# Patient Record
Sex: Male | Born: 1965 | Race: White | Hispanic: No | Smoking: Former smoker
Health system: Southern US, Community
[De-identification: ages and names within clinical notes are randomized; demographics above are authoritative.]

## PROBLEM LIST (undated history)

## (undated) DIAGNOSIS — E871 Hypo-osmolality and hyponatremia: Secondary | ICD-10-CM

## (undated) DIAGNOSIS — J45909 Unspecified asthma, uncomplicated: Secondary | ICD-10-CM

## (undated) DIAGNOSIS — M4850XA Collapsed vertebra, not elsewhere classified, site unspecified, initial encounter for fracture: Secondary | ICD-10-CM

## (undated) DIAGNOSIS — M810 Age-related osteoporosis without current pathological fracture: Secondary | ICD-10-CM

## (undated) DIAGNOSIS — K219 Gastro-esophageal reflux disease without esophagitis: Secondary | ICD-10-CM

## (undated) DIAGNOSIS — F419 Anxiety disorder, unspecified: Secondary | ICD-10-CM

## (undated) DIAGNOSIS — I10 Essential (primary) hypertension: Secondary | ICD-10-CM

## (undated) DIAGNOSIS — K259 Gastric ulcer, unspecified as acute or chronic, without hemorrhage or perforation: Secondary | ICD-10-CM

## (undated) DIAGNOSIS — F32A Depression, unspecified: Secondary | ICD-10-CM

## (undated) DIAGNOSIS — IMO0002 Reserved for concepts with insufficient information to code with codable children: Secondary | ICD-10-CM

## (undated) DIAGNOSIS — F1090 Alcohol use, unspecified, uncomplicated: Secondary | ICD-10-CM

## (undated) DIAGNOSIS — M109 Gout, unspecified: Secondary | ICD-10-CM

## (undated) DIAGNOSIS — C61 Malignant neoplasm of prostate: Secondary | ICD-10-CM

## (undated) DIAGNOSIS — T7840XA Allergy, unspecified, initial encounter: Secondary | ICD-10-CM

## (undated) DIAGNOSIS — Q600 Renal agenesis, unilateral: Secondary | ICD-10-CM

## (undated) DIAGNOSIS — M1990 Unspecified osteoarthritis, unspecified site: Secondary | ICD-10-CM

## (undated) HISTORY — DX: Allergy, unspecified, initial encounter: T78.40XA

## (undated) HISTORY — DX: Essential (primary) hypertension: I10

## (undated) HISTORY — DX: Depression, unspecified: F32.A

## (undated) HISTORY — DX: Unspecified osteoarthritis, unspecified site: M19.90

## (undated) HISTORY — DX: Gastro-esophageal reflux disease without esophagitis: K21.9

## (undated) HISTORY — DX: Anxiety disorder, unspecified: F41.9

## (undated) HISTORY — PX: PROSTATE BIOPSY: SHX241

## (undated) HISTORY — DX: Collapsed vertebra, not elsewhere classified, site unspecified, initial encounter for fracture: M48.50XA

## (undated) HISTORY — DX: Reserved for concepts with insufficient information to code with codable children: IMO0002

## (undated) HISTORY — PX: COLONOSCOPY WITH ESOPHAGOGASTRODUODENOSCOPY (EGD): SHX5779

## (undated) HISTORY — PX: TONSILLECTOMY: SUR1361

---

## 2005-05-31 ENCOUNTER — Emergency Department: Payer: Self-pay | Admitting: Emergency Medicine

## 2005-06-15 ENCOUNTER — Emergency Department: Payer: Self-pay | Admitting: General Practice

## 2008-05-04 ENCOUNTER — Ambulatory Visit: Payer: Self-pay | Admitting: Anesthesiology

## 2008-05-07 ENCOUNTER — Ambulatory Visit: Payer: Self-pay | Admitting: Anesthesiology

## 2008-06-01 ENCOUNTER — Ambulatory Visit: Payer: Self-pay | Admitting: Anesthesiology

## 2008-06-12 ENCOUNTER — Emergency Department: Payer: Self-pay | Admitting: Emergency Medicine

## 2008-07-07 ENCOUNTER — Ambulatory Visit: Payer: Self-pay | Admitting: Anesthesiology

## 2008-10-24 ENCOUNTER — Observation Stay: Payer: Self-pay | Admitting: Internal Medicine

## 2009-07-29 ENCOUNTER — Inpatient Hospital Stay: Payer: Self-pay | Admitting: Specialist

## 2012-08-13 ENCOUNTER — Emergency Department: Payer: Self-pay | Admitting: Emergency Medicine

## 2013-10-10 ENCOUNTER — Ambulatory Visit: Payer: Self-pay | Admitting: Family Medicine

## 2013-10-31 ENCOUNTER — Emergency Department: Payer: Self-pay | Admitting: Emergency Medicine

## 2013-10-31 LAB — CBC
MCH: 32.4 pg (ref 26.0–34.0)
MCHC: 34.1 g/dL (ref 32.0–36.0)
MCV: 95 fL (ref 80–100)
Platelet: 241 10*3/uL (ref 150–440)
WBC: 7.5 10*3/uL (ref 3.8–10.6)

## 2013-10-31 LAB — BASIC METABOLIC PANEL
Anion Gap: 8 (ref 7–16)
Calcium, Total: 8.9 mg/dL (ref 8.5–10.1)
Creatinine: 1.18 mg/dL (ref 0.60–1.30)
Osmolality: 277 (ref 275–301)
Sodium: 139 mmol/L (ref 136–145)

## 2013-10-31 LAB — TROPONIN I: Troponin-I: <0.02 ng/mL

## 2014-12-28 ENCOUNTER — Emergency Department: Payer: Self-pay | Admitting: Student

## 2016-01-28 DIAGNOSIS — K5791 Diverticulosis of intestine, part unspecified, without perforation or abscess with bleeding: Secondary | ICD-10-CM | POA: Insufficient documentation

## 2016-01-28 DIAGNOSIS — Q600 Renal agenesis, unilateral: Secondary | ICD-10-CM | POA: Insufficient documentation

## 2020-03-05 DIAGNOSIS — M47816 Spondylosis without myelopathy or radiculopathy, lumbar region: Secondary | ICD-10-CM | POA: Insufficient documentation

## 2022-05-07 ENCOUNTER — Encounter: Payer: Self-pay | Admitting: Radiology

## 2022-05-07 ENCOUNTER — Other Ambulatory Visit: Payer: Self-pay

## 2022-05-07 ENCOUNTER — Emergency Department: Payer: Medicare PPO

## 2022-05-07 ENCOUNTER — Emergency Department
Admission: EM | Admit: 2022-05-07 | Discharge: 2022-05-08 | Disposition: A | Discharge status: Home / Self Care | Payer: Medicare PPO | Attending: Emergency Medicine | Admitting: Emergency Medicine

## 2022-05-07 DIAGNOSIS — K2970 Gastritis, unspecified, without bleeding: Secondary | ICD-10-CM | POA: Diagnosis not present

## 2022-05-07 DIAGNOSIS — R109 Unspecified abdominal pain: Secondary | ICD-10-CM | POA: Diagnosis present

## 2022-05-07 LAB — URINALYSIS, COMPLETE (UACMP) WITH MICROSCOPIC
Bacteria, UA: NONE SEEN
Bilirubin Urine: NEGATIVE
Glucose, UA: NEGATIVE mg/dL
Hgb urine dipstick: NEGATIVE
Ketones, ur: NEGATIVE mg/dL
Leukocytes,Ua: NEGATIVE
Nitrite: NEGATIVE
Protein, ur: NEGATIVE mg/dL
Specific Gravity, Urine: 1.003 — ABNORMAL LOW (ref 1.005–1.030)
Squamous Epithelial / HPF: NONE SEEN (ref 0–5)
pH: 6 (ref 5.0–8.0)

## 2022-05-07 LAB — CBC
HCT: 44.7 % (ref 39.0–52.0)
Hemoglobin: 15.2 g/dL (ref 13.0–17.0)
MCH: 33.2 pg (ref 26.0–34.0)
MCHC: 34 g/dL (ref 30.0–36.0)
MCV: 97.6 fL (ref 80.0–100.0)
Platelets: 252 10*3/uL (ref 150–400)
RBC: 4.58 MIL/uL (ref 4.22–5.81)
RDW: 12.3 % (ref 11.5–15.5)
WBC: 5.9 10*3/uL (ref 4.0–10.5)
nRBC: 0 % (ref 0.0–0.2)

## 2022-05-07 LAB — COMPREHENSIVE METABOLIC PANEL
ALT: 21 U/L (ref 0–44)
AST: 23 U/L (ref 15–41)
Albumin: 4.9 g/dL (ref 3.5–5.0)
Alkaline Phosphatase: 46 U/L (ref 38–126)
Anion gap: 10 (ref 5–15)
BUN: 12 mg/dL (ref 6–20)
CO2: 25 mmol/L (ref 22–32)
Calcium: 10 mg/dL (ref 8.9–10.3)
Chloride: 98 mmol/L (ref 98–111)
Creatinine, Ser: 1.02 mg/dL (ref 0.61–1.24)
GFR, Estimated: >60 mL/min (ref >60)
Glucose, Bld: 90 mg/dL (ref 70–99)
Potassium: 4.3 mmol/L (ref 3.5–5.1)
Sodium: 133 mmol/L — ABNORMAL LOW (ref 135–145)
Total Bilirubin: 1.5 mg/dL — ABNORMAL HIGH (ref 0.3–1.2)
Total Protein: 8.4 g/dL — ABNORMAL HIGH (ref 6.5–8.1)

## 2022-05-07 LAB — SAMPLE TO BLOOD BANK

## 2022-05-07 LAB — LIPASE, BLOOD: Lipase: 23 U/L (ref 11–51)

## 2022-05-07 MED ORDER — SUCRALFATE 1 G PO TABS
1.0000 g | ORAL_TABLET | Freq: Four times a day (QID) | ORAL | 1 refills | Status: DC
  Filled 2022-05-07: qty 120, 30d supply, fill #0

## 2022-05-07 MED ORDER — MORPHINE SULFATE (PF) 4 MG/ML IV SOLN
4.0000 mg | Freq: Once | INTRAVENOUS | Status: AC
  Administered 2022-05-07: 4 mg via INTRAVENOUS
  Filled 2022-05-07: qty 1

## 2022-05-07 MED ORDER — ONDANSETRON HCL 4 MG/2ML IJ SOLN
4.0000 mg | Freq: Once | INTRAMUSCULAR | Status: AC
  Administered 2022-05-07: 4 mg via INTRAVENOUS
  Filled 2022-05-07: qty 2

## 2022-05-07 MED ORDER — SODIUM CHLORIDE 0.9 % IV BOLUS
1000.0000 mL | Freq: Once | INTRAVENOUS | Status: AC
  Administered 2022-05-07: 1000 mL via INTRAVENOUS

## 2022-05-07 NOTE — ED Triage Notes (Signed)
Pt states has had upper abd pain for " a long time" but began to experience possible hematemesis vs. Hemoptysis yesterday. Pt states he was supposed to obtain a biopsy on his prostate "a while ago" but has been unable to do so. Pt appears in no acute distress. Pt states he feels shaky and intermittently shob with activity.

## 2022-05-07 NOTE — ED Provider Notes (Signed)
[facility] Provider Note    None    (approximate)  History   Chief Complaint: Abdominal Pain and Hematemesis  HPI  Frank Terry is a 56 y.o. male who presents to the emergency department for abdominal pain.  According to the patient for the past few months he has been experiencing abdominal pain at times with nausea and vomiting at times with reflux.  Patient states today he spit up what appeared to be darker looking vomit.  Patient also states he has had some difficulty urinating at times, was supposed to have a prostate biopsy done months ago but never followed up.  Denies any known fever.  No diarrhea.  Denies dysuria.  Physical Exam   Triage Vital Signs: ED Triage Vitals  Enc Vitals Group     BP 05/07/22 1903 (!) 152/106     Pulse Rate 05/07/22 1903 91     Resp 05/07/22 1903 16     Temp 05/07/22 1903 98.3 F (36.8 C)     Temp Source 05/07/22 1903 Oral     SpO2 05/07/22 1903 96 %     Weight 05/07/22 1903 132 lb (59.9 kg)     Height 05/07/22 1903 '5\' 9"'$  (1.753 m)     Head Circumference --      Peak Flow --      Pain Score 05/07/22 1906 5     Pain Loc --      Pain Edu? --      Excl. in [place]? --     Most recent vital signs: Vitals:   05/07/22 1903  BP: (!) 152/106  Pulse: 91  Resp: 16  Temp: 98.3 F (36.8 C)  SpO2: 96%    General: Awake, no distress.  CV:  Good peripheral perfusion.  Regular rate and rhythm  Resp:  Normal effort.  Equal breath sounds bilaterally.  Abd:  No distention.  Soft, nontender.  No rebound or guarding.   ED Results / Procedures / Treatments   EKG  EKG viewed and interpreted by myself shows a normal sinus rhythm at 96 bpm with a narrow QRS, normal axis, normal intervals, no concerning ST changes.  RADIOLOGY  I have reviewed the chest x-ray images no obvious abnormality seen on my interpretation. Radiology is read the x-ray as COPD but no other acute abnormality. CT of the abdomen/pelvis shows no acute  findings.   MEDICATIONS ORDERED IN ED: Medications  sodium chloride 0.9 % bolus 1,000 mL (has no administration in time range)  morphine (PF) 4 MG/ML injection 4 mg (has no administration in time range)  ondansetron (ZOFRAN) injection 4 mg (has no administration in time range)     IMPRESSION / MDM / ASSESSMENT AND PLAN / ED COURSE  I reviewed the triage vital signs and the nursing notes.  Patient's presentation is most consistent with acute presentation with potential threat to life or bodily function.  Patient presents emergency department for upper abdominal pain and intermittent nausea and vomiting.  He states his symptoms have been ongoing for months but worsening recently.  He also states some difficulty urinating.  Patient's work-up is overall reassuring.  Chemistry is normal including normal renal function.  CBC is normal including a normal white blood cell count.  Lipase and LFTs are normal.  Urinalysis shows no concerning findings.  CT scan is negative.  Chest x-ray is clear.  I discussed with the patient the possibility of gastritis recommended Protonix and sucralfate with GI follow-up.  Patient states Protonix has given him gout in the past and he does not want to take any medications.  I discussed following up with GI medicine patient states he is homeless but he will just take Tums.  Given the patient's reassuring work-up I believe he is safe for discharge home.  Suspect likely gastritis versus peptic ulcer.  Patient states he has had peptic ulcers before.  We will still refer to GI medicine I will prescribe sucralfate for the patient and leave it up to him whether or not to take these medications.  FINAL CLINICAL IMPRESSION(S) / ED DIAGNOSES   Gastritis    Note:  This document was prepared using Dragon voice recognition software and may include unintentional dictation errors.   Harvest Dark, MD 05/07/22 2324

## 2022-05-07 NOTE — Discharge Instructions (Addendum)
Please call the number provided for GI medicine to arrange a follow-up appointment soon as possible.  Please begin taking your sucralfate as prescribed.  Return to the emergency department for any worsening pain, fever, or any other symptom personally concerning to yourself.

## 2022-05-08 ENCOUNTER — Other Ambulatory Visit: Payer: Self-pay

## 2022-05-18 ENCOUNTER — Emergency Department: Payer: Medicare PPO

## 2022-05-18 ENCOUNTER — Other Ambulatory Visit: Payer: Self-pay

## 2022-05-18 ENCOUNTER — Emergency Department
Admission: EM | Admit: 2022-05-18 | Discharge: 2022-05-18 | Disposition: A | Discharge status: Home / Self Care | Payer: Medicare PPO | Attending: Emergency Medicine | Admitting: Emergency Medicine

## 2022-05-18 DIAGNOSIS — R1032 Left lower quadrant pain: Secondary | ICD-10-CM | POA: Insufficient documentation

## 2022-05-18 DIAGNOSIS — M549 Dorsalgia, unspecified: Secondary | ICD-10-CM | POA: Diagnosis not present

## 2022-05-18 LAB — CBC WITH DIFFERENTIAL/PLATELET
Abs Immature Granulocytes: 0.01 10*3/uL (ref 0.00–0.07)
Basophils Absolute: 0.1 10*3/uL (ref 0.0–0.1)
Basophils Relative: 1 %
Eosinophils Absolute: 0.3 10*3/uL (ref 0.0–0.5)
Eosinophils Relative: 7 %
HCT: 42.2 % (ref 39.0–52.0)
Hemoglobin: 14.3 g/dL (ref 13.0–17.0)
Immature Granulocytes: 0 %
Lymphocytes Relative: 26 %
Lymphs Abs: 1.1 10*3/uL (ref 0.7–4.0)
MCH: 33.3 pg (ref 26.0–34.0)
MCHC: 33.9 g/dL (ref 30.0–36.0)
MCV: 98.4 fL (ref 80.0–100.0)
Monocytes Absolute: 0.6 10*3/uL (ref 0.1–1.0)
Monocytes Relative: 14 %
Neutro Abs: 2.3 10*3/uL (ref 1.7–7.7)
Neutrophils Relative %: 52 %
Platelets: 209 10*3/uL (ref 150–400)
RBC: 4.29 MIL/uL (ref 4.22–5.81)
RDW: 11.7 % (ref 11.5–15.5)
WBC: 4.5 10*3/uL (ref 4.0–10.5)
nRBC: 0 % (ref 0.0–0.2)

## 2022-05-18 LAB — CHLAMYDIA/NGC RT PCR (ARMC ONLY)
Chlamydia Tr: NOT DETECTED
N gonorrhoeae: NOT DETECTED

## 2022-05-18 LAB — COMPREHENSIVE METABOLIC PANEL
ALT: 21 U/L (ref 0–44)
AST: 27 U/L (ref 15–41)
Albumin: 4.3 g/dL (ref 3.5–5.0)
Alkaline Phosphatase: 37 U/L — ABNORMAL LOW (ref 38–126)
Anion gap: 8 (ref 5–15)
BUN: 9 mg/dL (ref 6–20)
CO2: 24 mmol/L (ref 22–32)
Calcium: 9.3 mg/dL (ref 8.9–10.3)
Chloride: 100 mmol/L (ref 98–111)
Creatinine, Ser: 0.78 mg/dL (ref 0.61–1.24)
GFR, Estimated: >60 mL/min (ref >60)
Glucose, Bld: 92 mg/dL (ref 70–99)
Potassium: 3.7 mmol/L (ref 3.5–5.1)
Sodium: 132 mmol/L — ABNORMAL LOW (ref 135–145)
Total Bilirubin: 0.8 mg/dL (ref 0.3–1.2)
Total Protein: 7 g/dL (ref 6.5–8.1)

## 2022-05-18 LAB — URINALYSIS, COMPLETE (UACMP) WITH MICROSCOPIC
Bacteria, UA: NONE SEEN
Bilirubin Urine: NEGATIVE
Glucose, UA: NEGATIVE mg/dL
Hgb urine dipstick: NEGATIVE
Ketones, ur: NEGATIVE mg/dL
Leukocytes,Ua: NEGATIVE
Nitrite: NEGATIVE
Protein, ur: NEGATIVE mg/dL
Specific Gravity, Urine: 1.003 — ABNORMAL LOW (ref 1.005–1.030)
Squamous Epithelial / HPF: NONE SEEN (ref 0–5)
pH: 6 (ref 5.0–8.0)

## 2022-05-18 MED ORDER — OXYCODONE-ACETAMINOPHEN 5-325 MG PO TABS
1.0000 | ORAL_TABLET | Freq: Once | ORAL | Status: AC
  Administered 2022-05-18: 1 via ORAL
  Filled 2022-05-18: qty 1

## 2022-05-18 MED ORDER — IOHEXOL 300 MG/ML  SOLN
100.0000 mL | Freq: Once | INTRAMUSCULAR | Status: AC | PRN
Start: 2022-05-18 — End: 2022-05-18
  Administered 2022-05-18: 100 mL via INTRAVENOUS

## 2022-05-18 MED ORDER — OXYCODONE-ACETAMINOPHEN 5-325 MG PO TABS: 1.0000 | ORAL_TABLET | ORAL | 0 refills | Status: DC | PRN

## 2022-05-18 NOTE — ED Notes (Signed)
Pt verbalized understanding of discharge instructions, prescriptions, and follow-up care instructions. Pt advised if symptoms worsen to return to ED.

## 2022-05-18 NOTE — ED Triage Notes (Signed)
Pt reports pain in lower back for about 3 weeksPt denies injuries. Pt also reports some pain to his left groin along with swelling. Pt states currenlty being checked for prostate cancer.

## 2022-05-18 NOTE — ED Provider Notes (Signed)
[facility] Provider Note    Event Date/Time   First MD Initiated Contact with Patient 05/18/22 1123     (approximate)   History   Chief Complaint Groin Pain and Back Pain   HPI  Frank Terry is a 56 y.o. male with no significant past medical history presents to the ED complaining of groin pain.  Patient reports that he has been dealing with a couple of weeks of pain in the area of his left inguinal crease.  He states the pain is constant and sharp, worse with palpation or certain movements.  He states that the pain seems to radiate down towards his left testicle but he does not have any pain in the testicle itself.  He denies any dysuria, hematuria, penile discharge, fevers, or flank pain.  He does state that the area in his left inguinal crease seems slightly swollen, but he denies any history of inguinal hernia.  He does state that he has been diagnosed with vasitis in the past and current symptoms feel similar.  He has not had any abdominal pain, nausea, or vomiting.  He states he has not been sexually active in the past 6 months.     Physical Exam   Triage Vital Signs: ED Triage Vitals  Enc Vitals Group     BP 05/18/22 1052 (!) 148/97     Pulse Rate 05/18/22 1052 82     Resp 05/18/22 1052 19     Temp 05/18/22 1052 97.9 F (36.6 C)     Temp Source 05/18/22 1052 Oral     SpO2 05/18/22 1052 100 %     Weight 05/18/22 1050 132 lb (59.9 kg)     Height 05/18/22 1050 '5\' 9"'$  (1.753 m)     Head Circumference --      Peak Flow --      Pain Score 05/18/22 1049 7     Pain Loc --      Pain Edu? --      Excl. in [place]? --     Most recent vital signs: Vitals:   05/18/22 1052 05/18/22 1650  BP: (!) 148/97 (!) 149/85  Pulse: 82 75  Resp: 19 16  Temp: 97.9 F (36.6 C) 97.9 F (36.6 C)  SpO2: 100% 100%    Constitutional: Alert and oriented. Eyes: Conjunctivae are normal. Head: Atraumatic. Nose: No congestion/rhinnorhea. Mouth/Throat: Mucous membranes  are moist.  Cardiovascular: Normal rate, regular rhythm. Grossly normal heart sounds.  2+ radial pulses bilaterally. Respiratory: Normal respiratory effort.  No retractions. Lungs CTAB. Gastrointestinal: Soft and nontender. No distention. Genitourinary: Left testicular tenderness to palpation noted with no erythema, warmth, or fluctuance.  Patient with more pronounced tenderness in his left inguinal crease with mild associated edema, no hernia noted. Musculoskeletal: No lower extremity tenderness nor edema.  Neurologic:  Normal speech and language. No gross focal neurologic deficits are appreciated.    ED Results / Procedures / Treatments   Labs (all labs ordered are listed, but only abnormal results are displayed) Labs Reviewed  COMPREHENSIVE METABOLIC PANEL - Abnormal; Notable for the following components:      Result Value   Sodium 132 (*)    Alkaline Phosphatase 37 (*)    All other components within normal limits  URINALYSIS, COMPLETE (UACMP) WITH MICROSCOPIC - Abnormal; Notable for the following components:   Color, Urine STRAW (*)    APPearance CLEAR (*)    Specific Gravity, Urine 1.003 (*)    All other  components within normal limits  CHLAMYDIA/NGC RT PCR ([facility] ONLY)            CBC WITH DIFFERENTIAL/PLATELET   RADIOLOGY CT abdomen/pelvis reviewed and interpreted by me with no focal fluid collections, dilated bowel loops, or inflammatory changes noted.  PROCEDURES:  Critical Care performed: No  Procedures   MEDICATIONS ORDERED IN ED: Medications  oxyCODONE-acetaminophen (PERCOCET/ROXICET) 5-325 MG per tablet 1 tablet (has no administration in time range)  oxyCODONE-acetaminophen (PERCOCET/ROXICET) 5-325 MG per tablet 1 tablet (1 tablet Oral Given 05/18/22 1251)  iohexol (OMNIPAQUE) 300 MG/ML solution 100 mL (100 mLs Intravenous Contrast Given 05/18/22 1825)     IMPRESSION / MDM / ASSESSMENT AND PLAN / ED COURSE  I reviewed the triage vital signs and the nursing  notes.                              56 y.o. male with no significant past medical history presents to the ED with 2 weeks of pain in his left groin radiating down towards his left testicle without any urinary symptoms.  Patient's presentation is most consistent with acute presentation with potential threat to life or bodily function.  Differential diagnosis includes, but is not limited to, epididymitis, vasitis, UTI, urethritis, inguinal hernia, cellulitis.  Patient well-appearing and in no acute distress, vital signs are unremarkable and initial labs are reassuring with no significant anemia, leukocytosis, electrolyte abnormality, or AKI.  His LFTs are within normal limits, urinalysis is pending at this time.  Plan to further assess with ultrasound for epididymitis given his scrotal tenderness, no obvious hernia noted on exam.  We will also send urine for GC and chlamydia.  He had CT performed about 2 weeks ago for upper abdominal pain that did not show any left inguinal hernia at that time or other acute pathology in his pelvic area.  Plan to treat symptomatically with Percocet and reassess following urinalysis and imaging.  Scrotal ultrasound is unremarkable and urinalysis shows no signs of infection, GC and chlamydia testing is also negative.  Patient continues to have significant tenderness in his left groin area, CT scan was performed and shows bladder wall thickening but doubt UTI given his reassuring urinalysis.  There is question of sclerotic lesion in his spine but no reason to suspect metastatic disease at this time.  He is appropriate for discharge home, will provide referral to urology as he states he was supposed to have a prostate biopsy when he was living in [place], also describes symptoms as similar to when he was diagnosed with vasitis in the past.  He was counseled to return to the ED for new or worsening symptoms, patient agrees with plan.      FINAL CLINICAL IMPRESSION(S) / ED  DIAGNOSES   Final diagnoses:  Groin pain, left     Rx / DC Orders   ED Discharge Orders          Ordered    oxyCODONE-acetaminophen (PERCOCET) 5-325 MG tablet  Every 4 hours PRN        05/18/22 1912             Note:  This document was prepared using Dragon voice recognition software and may include unintentional dictation errors.   Blake Divine, MD 05/18/22 [phone]

## 2022-05-25 ENCOUNTER — Ambulatory Visit: Payer: Medicare PPO | Admitting: Urology

## 2022-06-02 ENCOUNTER — Other Ambulatory Visit: Payer: Self-pay

## 2022-06-07 NOTE — Congregational Nurse Program (Signed)
  Dept: [place] Nurse Program Note  Date of Encounter: 06/07/2022 New client to clinic for vital sign check and to discuss getting a PCP. He would like to use a provides at [place] clinic. He reports he had an apt with a urologist, Dr. Glori Luis, but was unable to keep it due to his co-pays.  He has Medicare and thinks he may have Medicaid as well. Discussed the possibility of Medicaid covering these co-pays.He plans to call Social Services for assistance with this. He is currently living in a boarding room. Support given. Client to return to clinic for continued vital sign check and assistance with obtaining a PCP. Past Medical History: No past medical history on file.  Encounter Details:  CNP Questionnaire - 06/07/22 1138       Questionnaire   Do you give verbal consent to treat you today? Yes    Location Patient [place] or Organization    Patient Status Unknown   currently in a boarding [place], has been told he has Medicaid as well, does not have a card   Insurance Referral N/A    Medication Have Medication Insecurities   currenlty cannot afford his copays. He is checking into his medicaid for this   Medical Provider No   discussed PCP, he would like to go to [place] Referral N/A    Medical Appointment Made N/A   he reports he had an apt with Dr. Dorette Grate, in Urology, but could not go d/t his copay.   Food Have Food Programmer, multimedia N/A   uses the [employer] bus system currenlty   Housing/Utilities N/A    Chiropractor N/A    Intervention Blood pressure;Navigate Healthcare System;Case Management;Support    ED Visit Averted N/A    Life-Saving Intervention Made N/A

## 2022-06-08 NOTE — Congregational Nurse Program (Signed)
  Dept: [place] Nurse Program Note  Date of Encounter: 06/08/2022 Client to clinic to ask about allergy medication. He also reports having checked into his Medicaid coverage and it does only cover his Medicare premium, not his medication co-pays. Client to return to clinic next week to explore options regarding coverage of his medication copays with center case manager. Past Medical History: No past medical history on file.  Encounter Details:  CNP Questionnaire - 06/08/22 1256       Questionnaire   Do you give verbal consent to treat you today? Yes    Location Patient [place] or Organization    Patient Status Unknown   currently in a boarding [place], has been told he has Medicaid as well, does not have a card   Insurance Referral N/A    Medication Have Medication Insecurities   currenlty cannot afford his copays. He is checking into his medicaid for this   Medical Provider No   discussed PCP, he would like to go to [place] Referral N/A    Medical Appointment Made N/A   he reports he had an apt with Dr. Dorette Grate, in Urology, but could not go d/t his copay.   Food Have Food Programmer, multimedia N/A   uses the [employer] bus system currenlty   Housing/Utilities N/A    Interpersonal Safety N/A    Intervention Support    ED Visit Averted N/A    Life-Saving Intervention Made N/A

## 2022-06-14 NOTE — Congregational Nurse Program (Signed)
  Dept: [place] Nurse Program Note  Date of Encounter: 06/14/2022 Client to clinic with request for vital sigh check. He reports "not feeling well". Symptoms were vague. Discussed the need to hydrate and stay in a cool place indoors. He reports he does have airconditioning in his boarding room and plans to return there from clinic today. Plan to discuss obtaining a new PCP at next visit and client reports he has COPD and other health issues.  Past Medical History: No past medical history on file.  Encounter Details:  CNP Questionnaire - 06/14/22 1304       Questionnaire   Do you give verbal consent to treat you today? Yes    Location Patient [place] or Organization    Patient Status Unknown   currently in a boarding [place], has been told he has Medicaid as well, does not have a card   Insurance Referral N/A    Medication Have Medication Insecurities   currenlty cannot afford his copays. He is checking into his medicaid for this   Medical Provider No   discussed PCP, he would like to go to [place] Referral N/A    Medical Appointment Made N/A   he reports he had an apt with Dr. Dorette Grate, in Urology, but could not go d/t his copay.   Food Have Food Programmer, multimedia N/A   uses the [employer] bus system currenlty   Housing/Utilities N/A    Interpersonal Safety N/A    Intervention Support;Blood pressure    ED Visit Averted N/A    Life-Saving Intervention Made N/A

## 2022-06-21 NOTE — Congregational Nurse Program (Signed)
  Dept: [phone]   Congregational Nurse Program Note  Date of Encounter: 06/21/2022 Client to clinic today to discuss getting a PCP , pulmonologist, Orthopedic and urologist. All with in the [facility] system at client request.. Contact numbers given. RN to assist with appointments as needed. Client does have [employer].   Past Medical History: No past medical history on file.  Encounter Details:  CNP Questionnaire - 06/21/22 1100       Questionnaire   Do you give verbal consent to treat you today? Yes    Location Patient [place] or Organization    Patient Status Unknown   currently in a boarding [place], has been told he has Medicaid as well, does not have a card   Insurance Referral N/A    Medication Have Medication Insecurities   reports today he has money for copays on medications   Medical Provider No   discussed PCP, he would like to go to [place] clinic   Screening Referrals N/A    Medical Referral N/A   disucssed with client options for PCP.   Medical Appointment Made N/A    Food Have Food Insecurities    Transportation N/A   uses the [employer] bus system currenlty   Housing/Utilities N/A    Interpersonal [place]    ED Visit Averted N/A    Life-Saving Intervention Made N/A

## 2022-06-30 ENCOUNTER — Emergency Department
Admission: EM | Admit: 2022-06-30 | Discharge: 2022-06-30 | Disposition: A | Discharge status: Home / Self Care | Payer: Medicare PPO | Attending: Emergency Medicine | Admitting: Emergency Medicine

## 2022-06-30 ENCOUNTER — Other Ambulatory Visit: Payer: Self-pay

## 2022-06-30 DIAGNOSIS — R109 Unspecified abdominal pain: Secondary | ICD-10-CM | POA: Diagnosis present

## 2022-06-30 DIAGNOSIS — N401 Enlarged prostate with lower urinary tract symptoms: Secondary | ICD-10-CM | POA: Insufficient documentation

## 2022-06-30 DIAGNOSIS — Z8546 Personal history of malignant neoplasm of prostate: Secondary | ICD-10-CM | POA: Insufficient documentation

## 2022-06-30 HISTORY — DX: Malignant neoplasm of prostate: C61

## 2022-06-30 HISTORY — DX: Age-related osteoporosis without current pathological fracture: M81.0

## 2022-06-30 HISTORY — DX: Gout, unspecified: M10.9

## 2022-06-30 HISTORY — DX: Unspecified asthma, uncomplicated: J45.909

## 2022-06-30 LAB — CBC
HCT: 46.8 % (ref 39.0–52.0)
Hemoglobin: 15.7 g/dL (ref 13.0–17.0)
MCH: 33.4 pg (ref 26.0–34.0)
MCHC: 33.5 g/dL (ref 30.0–36.0)
MCV: 99.6 fL (ref 80.0–100.0)
Platelets: 265 10*3/uL (ref 150–400)
RBC: 4.7 MIL/uL (ref 4.22–5.81)
RDW: 12.4 % (ref 11.5–15.5)
WBC: 7 10*3/uL (ref 4.0–10.5)
nRBC: 0 % (ref 0.0–0.2)

## 2022-06-30 LAB — URINALYSIS, ROUTINE W REFLEX MICROSCOPIC
Bilirubin Urine: NEGATIVE
Glucose, UA: NEGATIVE mg/dL
Hgb urine dipstick: NEGATIVE
Ketones, ur: NEGATIVE mg/dL
Leukocytes,Ua: NEGATIVE
Nitrite: NEGATIVE
Protein, ur: NEGATIVE mg/dL
Specific Gravity, Urine: 1.008 (ref 1.005–1.030)
pH: 7 (ref 5.0–8.0)

## 2022-06-30 LAB — COMPREHENSIVE METABOLIC PANEL
ALT: 13 U/L (ref 0–44)
AST: 17 U/L (ref 15–41)
Albumin: 4.5 g/dL (ref 3.5–5.0)
Alkaline Phosphatase: 43 U/L (ref 38–126)
Anion gap: 6 (ref 5–15)
BUN: 11 mg/dL (ref 6–20)
CO2: 24 mmol/L (ref 22–32)
Calcium: 9.2 mg/dL (ref 8.9–10.3)
Chloride: 106 mmol/L (ref 98–111)
Creatinine, Ser: 0.89 mg/dL (ref 0.61–1.24)
GFR, Estimated: >60 mL/min (ref >60)
Glucose, Bld: 93 mg/dL (ref 70–99)
Potassium: 4.4 mmol/L (ref 3.5–5.1)
Sodium: 136 mmol/L (ref 135–145)
Total Bilirubin: 1.4 mg/dL — ABNORMAL HIGH (ref 0.3–1.2)
Total Protein: 7.2 g/dL (ref 6.5–8.1)

## 2022-06-30 LAB — LIPASE, BLOOD: Lipase: 34 U/L (ref 11–51)

## 2022-06-30 MED ORDER — ALFUZOSIN HCL ER 10 MG PO TB24: 10.0000 mg | ORAL_TABLET | Freq: Every day | ORAL | 0 refills | Status: AC

## 2022-06-30 NOTE — ED Triage Notes (Signed)
Pt to ED via POV from home. Pt reports right flank/back pain that started this morning. Pt reports pain with urination. Pt denies hx kidney stones or kidney infections.   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

## 2022-06-30 NOTE — ED Provider Notes (Signed)
[facility] Provider Note   Event Date/Time   First MD Initiated Contact with Patient 06/30/22 1112     (approximate) History  Abdominal Pain and Flank Pain  HPI Frank Terry is a 56 y.o. male with a recently diagnosed history of prostate cancer who presents for suprapubic pain as well as right flank pain that began yesterday evening.  Patient states that this pain is partially relieved with urination.  Patient denies any radiation of this pain but does describe 8/10, constant throbbing.  Patient denies any medications for prostatic hypertrophy at this time. ROS: Patient currently denies any vision changes, tinnitus, difficulty speaking, facial droop, sore throat, chest pain, shortness of breath, abdominal pain, nausea/vomiting/diarrhea, hematuria, difficulty with initiating urination, dysuria, or weakness/numbness/paresthesias in any extremity   Physical Exam  Triage Vital Signs: ED Triage Vitals  Enc Vitals Group     BP 06/30/22 0906 (!) 133/90     Pulse Rate 06/30/22 0906 99     Resp 06/30/22 0906 18     Temp 06/30/22 0906 97.8 F (36.6 C)     Temp Source 06/30/22 0906 Oral     SpO2 06/30/22 0906 99 %     Weight 06/30/22 0907 135 lb (61.2 kg)     Height 06/30/22 0907 '5\' 9"'$  (1.753 m)     Head Circumference --      Peak Flow --      Pain Score 06/30/22 0907 8     Pain Loc --      Pain Edu? --      Excl. in [place]? --    Most recent vital signs: Vitals:   06/30/22 0906 06/30/22 1119  BP: (!) 133/90 130/88  Pulse: 99 90  Resp: 18 18  Temp: 97.8 F (36.6 C)   SpO2: 99% 99%   General: Awake, oriented x4. CV:  Good peripheral perfusion.  Resp:  Normal effort.  Abd:  No distention.  Mild suprapubic abdominal tenderness to palpation Other:  Middle-aged Caucasian male laying in bed in no acute distress ED Results / Procedures / Treatments  Labs (all labs ordered are listed, but only abnormal results are displayed) Labs Reviewed  COMPREHENSIVE  METABOLIC PANEL - Abnormal; Notable for the following components:      Result Value   Total Bilirubin 1.4 (*)    All other components within normal limits  URINALYSIS, ROUTINE W REFLEX MICROSCOPIC - Abnormal; Notable for the following components:   Color, Urine STRAW (*)    APPearance CLEAR (*)    All other components within normal limits  LIPASE, BLOOD  CBC   PROCEDURES: Critical Care performed: No Procedures MEDICATIONS ORDERED IN ED: Medications - No data to display IMPRESSION / MDM / [place] / ED COURSE  I reviewed the triage vital signs and the nursing notes.                             Differential diagnosis includes, but is not limited to, kidney stone, urinary tract infection, pyelonephritis, appendicitis Patient's presentation is most consistent with acute presentation with potential threat to life or bodily function. Patient is a middle-aged Caucasian male who is recently diagnosed with prostate cancer as well as prostatic hypertrophy who presents for right flank pain and suprapubic discomfort that began overnight last night.  Patient does describe that this pain is improved after urination and therefore I have concern for the symptoms being related to  LUTS given his prostatic hypertrophy.  Given patient does not have any evidence of leukocytosis, urinalysis does not show any evidence of infection, and renal function is normal, I have low suspicion for the above-stated differential.  Given patient's description of lower urinary tract symptoms, I will start patient on alfuzosin with instructions to follow-up with urology as soon as possible for further evaluation and management.  Dispo: Discharge home with urology follow-up   FINAL CLINICAL IMPRESSION(S) / ED DIAGNOSES   Final diagnoses:  Right flank pain  Hyperplasia of prostate with lower urinary tract symptoms (LUTS)   Rx / DC Orders   ED Discharge Orders          Ordered    alfuzosin (UROXATRAL) 10 MG 24  hr tablet  Daily with breakfast        06/30/22 1144           Note:  This document was prepared using Dragon voice recognition software and may include unintentional dictation errors.   Naaman Plummer, MD 06/30/22 [phone]

## 2022-07-04 NOTE — Congregational Nurse Program (Signed)
  Dept: [place] Nurse Program Note  Date of Encounter: 07/04/2022 [facility] to [facility] today with concern about his feet. Foot care provided, nails trimmed. Athlete's foot cream given. Discussed again his need to make an appointment with a urologist as he had gone to the [facility] ED on [facility]/11 w/flank pain. He declined assistance, stated he wanted to talk with his daughter, who would want to accompany him. RN encouraged client to call as it might be several weeks before he could be seen. Client voiced understanding and again declined assistance with making an appointment. Past Medical History: Past Medical History:  Diagnosis Date   Asthma    Gout    Osteoporosis    Prostate cancer [facility])     Encounter Details:  CNP Questionnaire - 07/04/22 1128       Questionnaire   Do you give verbal consent to treat you today? Yes    Location Patient [place] or Organization    Patient Status Unknown   currently in a boarding [place], has been told he has Medicaid as well, does not have a card   Insurance Referral N/A    Medication N/A   reports today he has money for copays on medications   Medical Provider No   discussed PCP, he would like to go to [facility] [facility]   Screening Referrals N/A    Medical Referral N/A   disucssed with client options for PCP.   Medical Appointment Made N/A    Food Have Food Insecurities   has food stamps   Transportation N/A   uses the [employer] bus system currenlty   Housing/Utilities N/A    Interpersonal Safety N/A    Intervention Support;Educate    ED Visit Averted N/A    Life-Saving Intervention Made N/A

## 2022-07-11 NOTE — Congregational Nurse Program (Signed)
  Dept: [phone]   Congregational Nurse Program Note  Date of Encounter: 07/11/2022 Client to [facility] clinic for blood pressure check and foot care. BP 110/64 (BP Location: Left Arm, Patient Position: Sitting, Cuff Size: Normal)   Pulse (!) 102   SpO2 97% . Foot care provided. Athlete's foot has improved. RN encouraged client again to make an apt at with a new PCP and also the urologist. Client voiced understanding and plans to make the appointments "soon". Client has declined RN assistance in making appointments. Support given.  Past Medical History: Past Medical History:  Diagnosis Date   Asthma    Gout    Osteoporosis    Prostate cancer [facility])     Encounter Details:  CNP Questionnaire - 07/11/22 1135       Questionnaire   Do you give verbal consent to treat you today? Yes    Location Patient [place] or Organization    Patient Status Unknown   currently in a boarding [employer], has been told he has Medicaid as well, does not have a card   Insurance Referral N/A    Medication N/A   reports today he has money for copays on medications   Medical Provider No   discussed PCP, he would like to go to [facility]   Screening Referrals N/A    Medical Referral N/A   disucssed with client options for PCP.   Medical Appointment Made N/A    Food Have Food Insecurities   has food stamps   Transportation N/A   uses the [employer] bus system currenlty   Housing/Utilities N/A    Interpersonal Safety N/A    Intervention Support;Educate;Blood pressure    ED Visit Averted N/A    Life-Saving Intervention Made N/A

## 2022-07-14 IMAGING — CR DG CHEST 2V
3 series · 3 of 3 positions shown · non-contrast
Comparison: 12/28/2014

CLINICAL DATA: Dyspnea

EXAM:
CHEST - 2 VIEW

[chest pa]
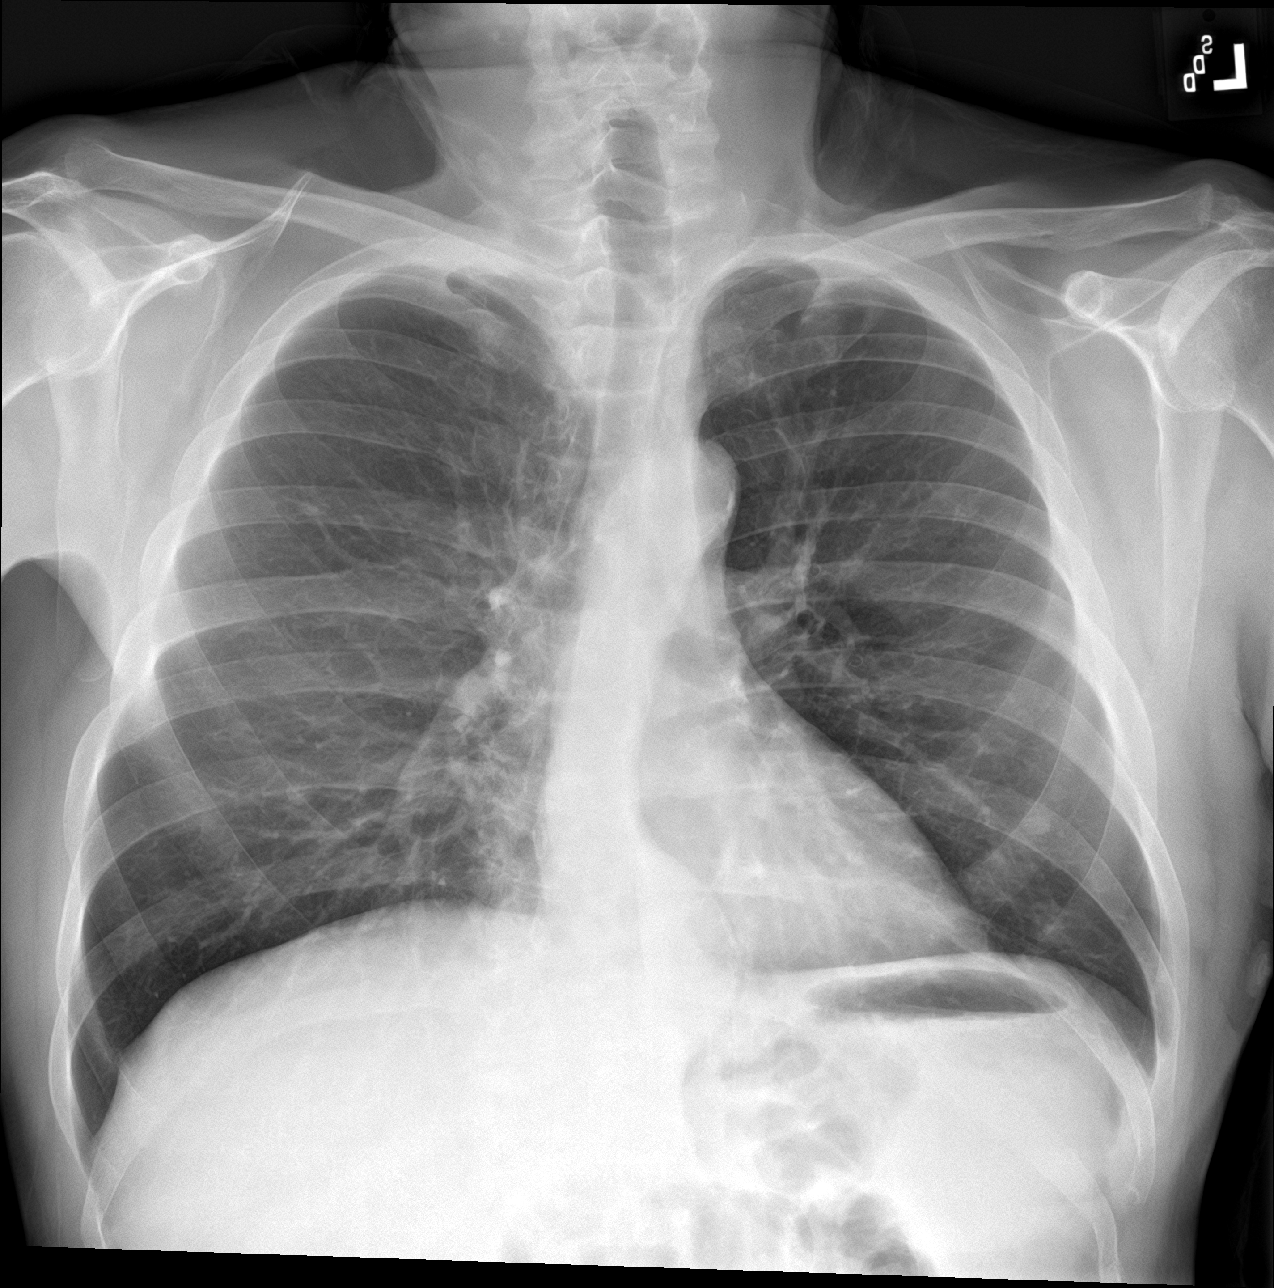

[chest lat (1 of 2)]
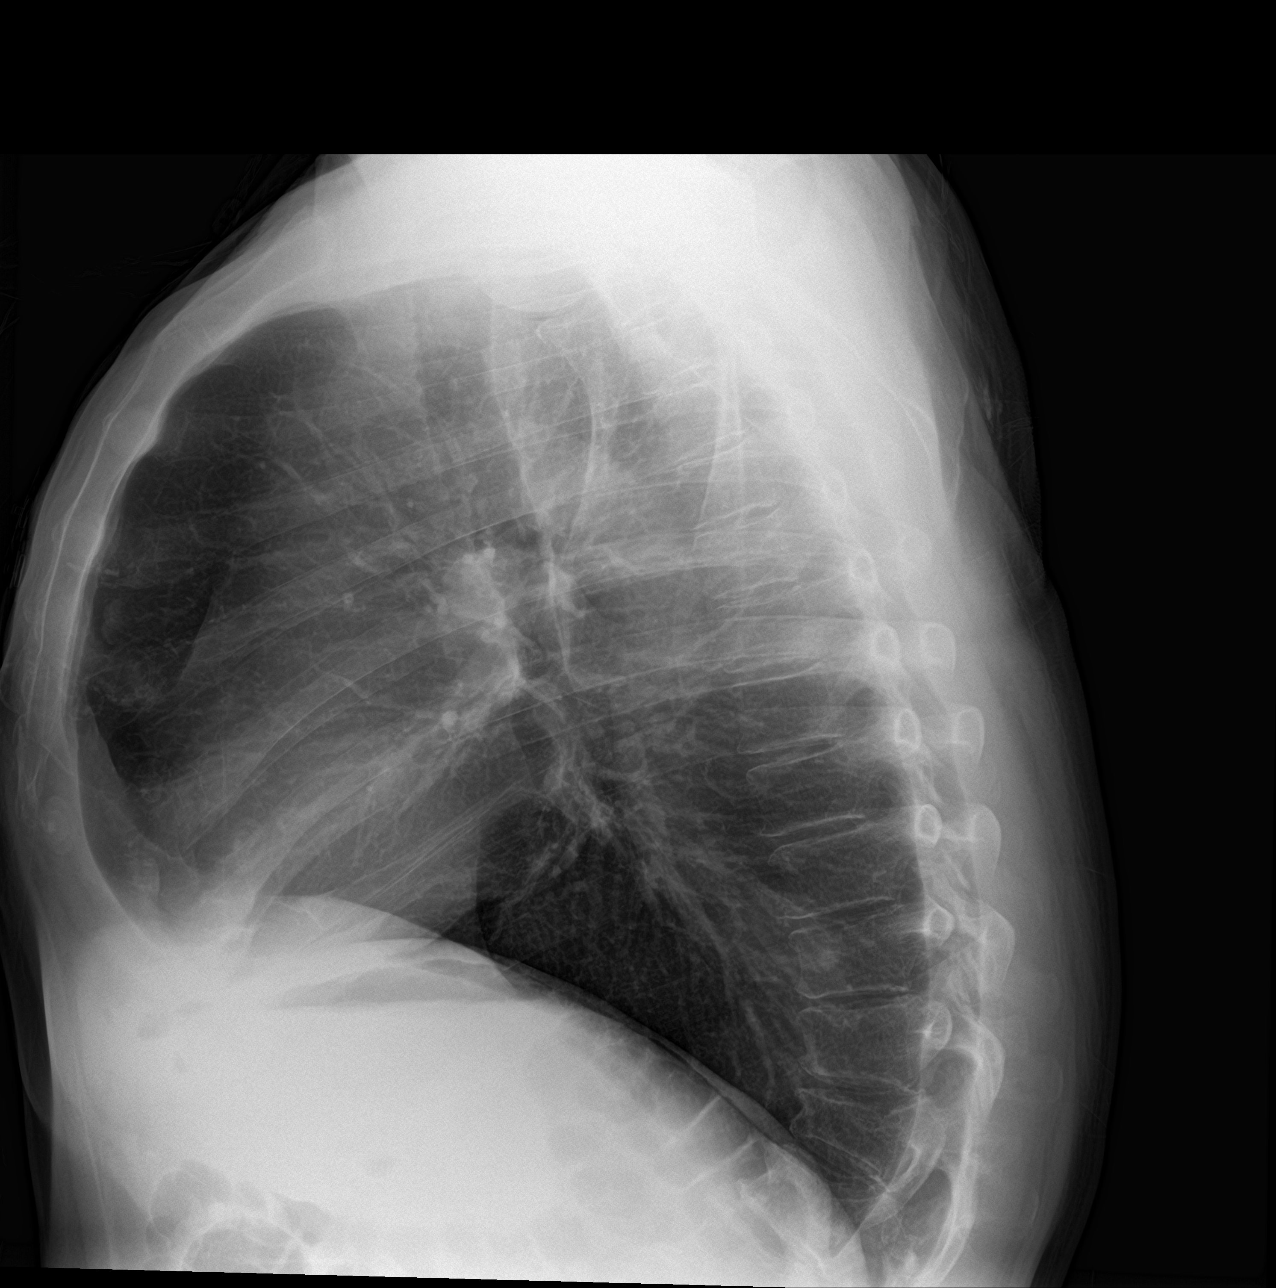

[chest lat (2 of 2)]
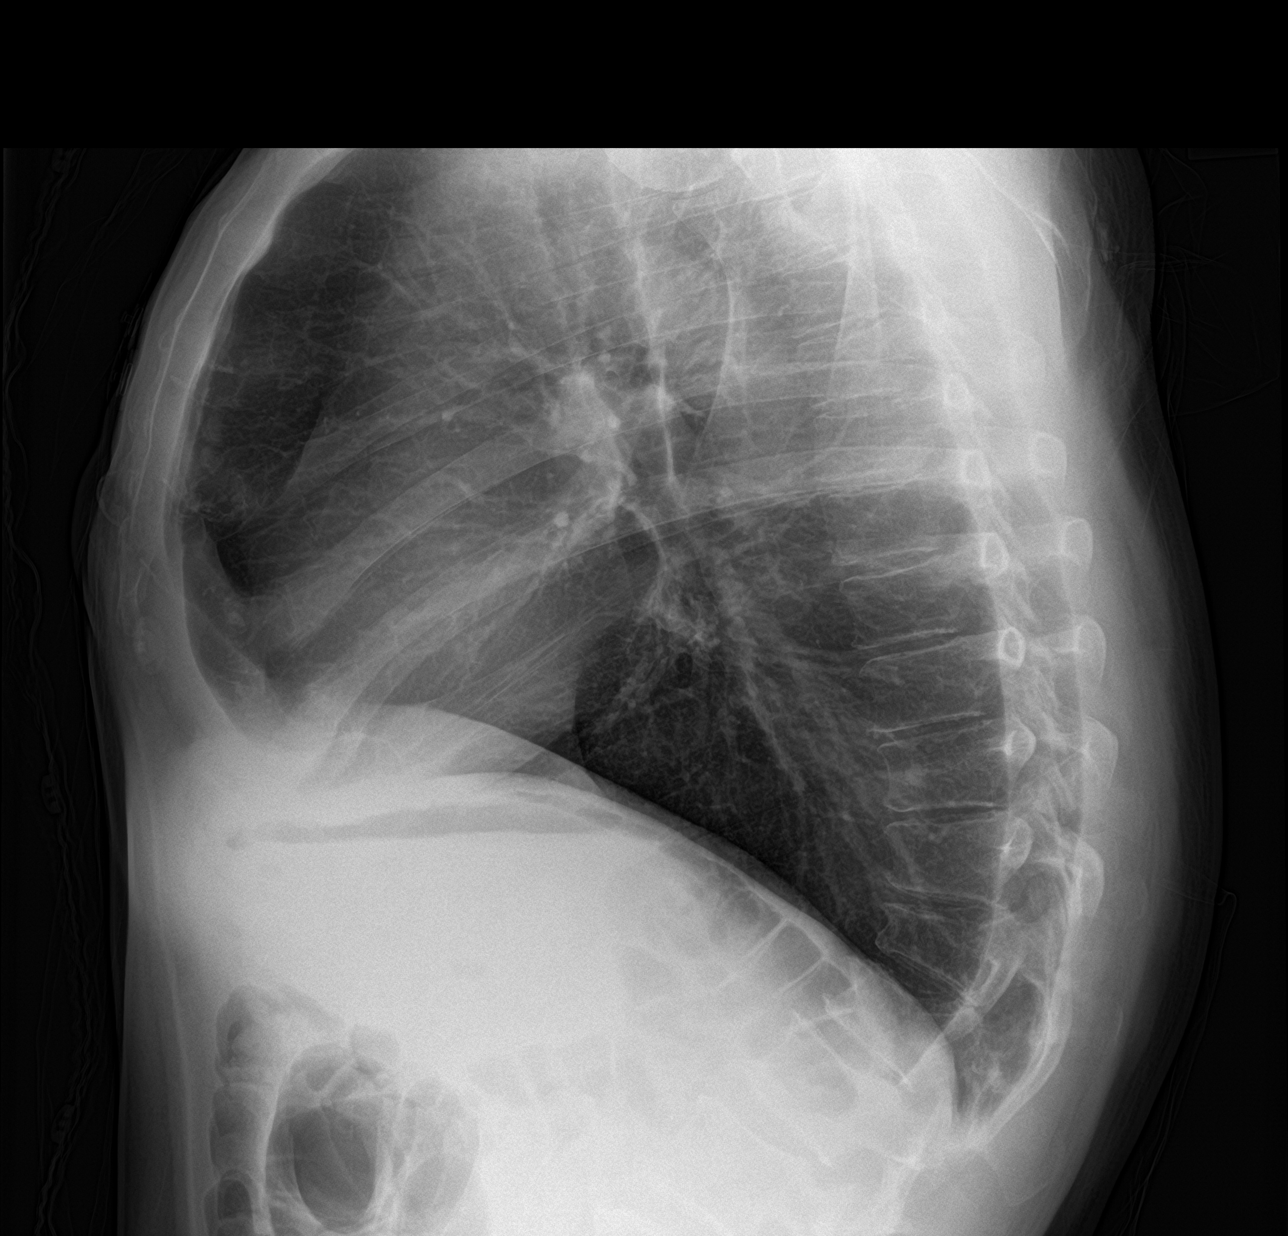

[3 of 3 positions shown; findings below may reference images not displayed]

FINDINGS: There is progressive pulmonary hyperinflation in keeping with
changes of underlying COPD. No focal pulmonary infiltrate noted.
Nodular densities the lung bases are in keeping with nipple shadows.
No pneumothorax or pleural effusion. Cardiac size within normal
limits. Rounded opacity at the diaphragmatic hiatus represents
tortuosity of the thoracoabdominal aorta. No acute bone abnormality.
Remote appearing compression deformities of T12 and L1 are seen with
resultant focal kyphosis at the thoracolumbar junction.
IMPRESSION: COPD. No evidence of acute cardiopulmonary disease.

## 2022-07-14 IMAGING — CT CT ABD-PELV W/O CM
2 of 4 series · 16 of 46 positions shown, 18 images · non-contrast
Comparison: None Available.

CLINICAL DATA: Acute nonlocalized abdominal pain. Hemoptysis
yesterday.



[Series 2: routine abd/pel wo · axial · 0.79mm/px · z∈[-1219,-809]mm · 13 of 90 slices shown, 15 images]
[im 4/90  soft-tissue]
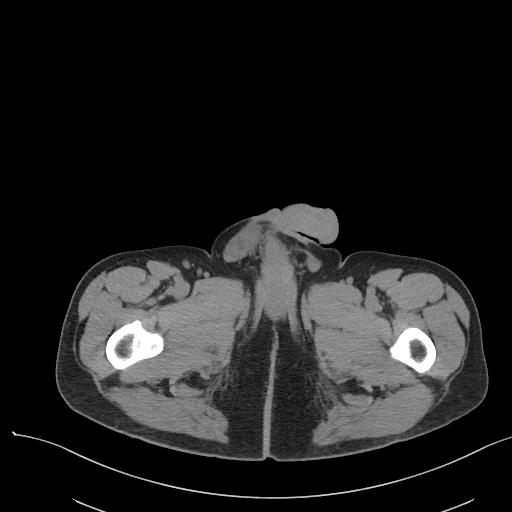
[im 4/90  bone]
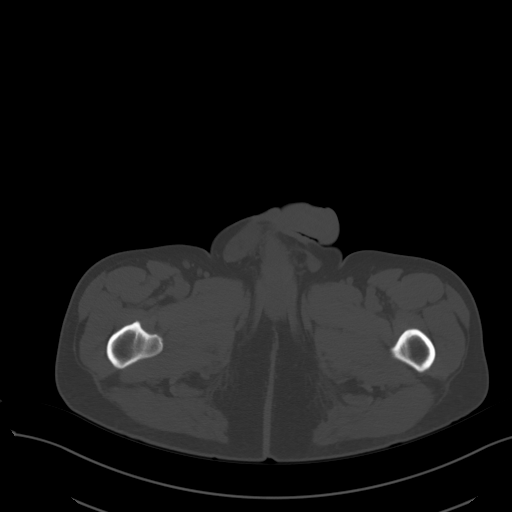
[im 12/90  soft-tissue]
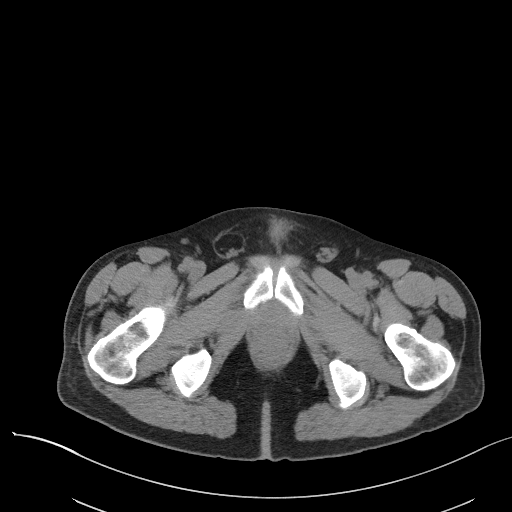
[im 19/90  soft-tissue]
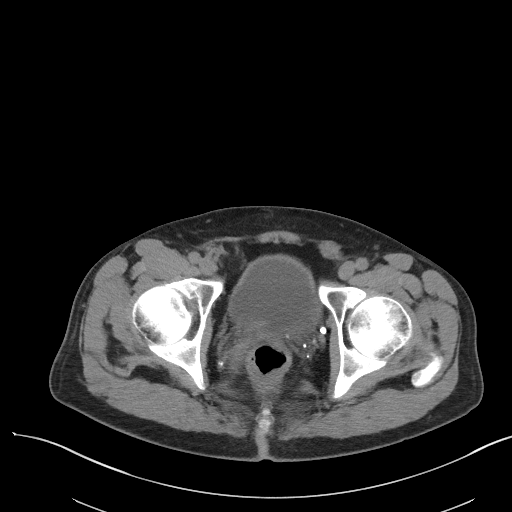
[im 26/90  soft-tissue]
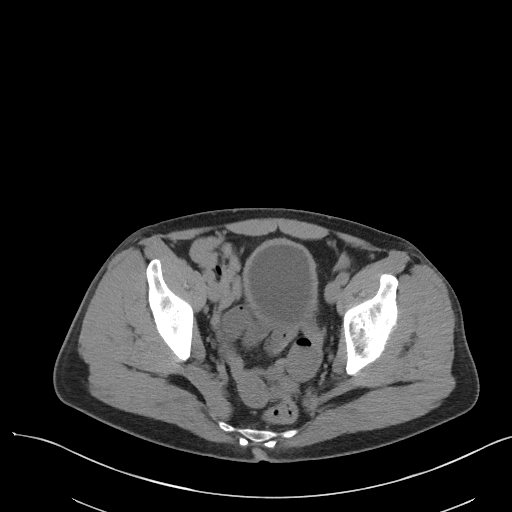
[im 30/90  soft-tissue]
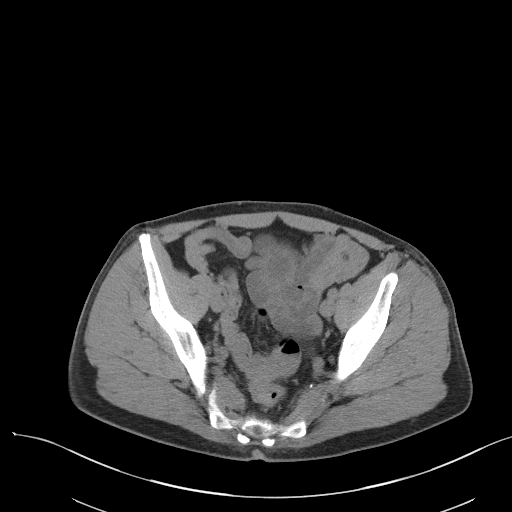
[im 38/90  soft-tissue]
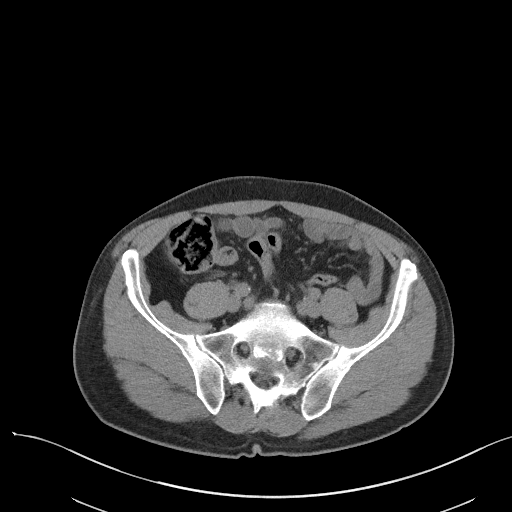
[im 45/90  soft-tissue]
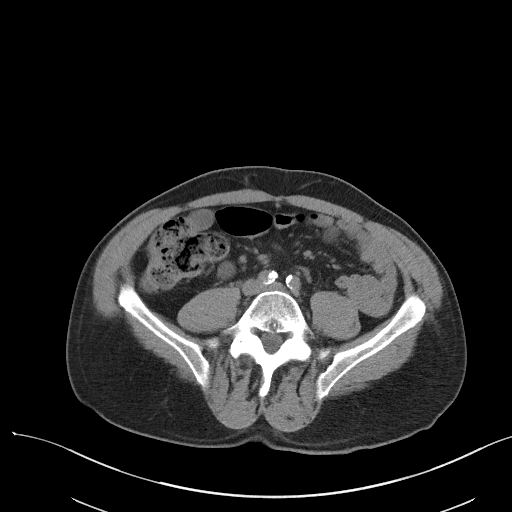
[im 52/90  soft-tissue]
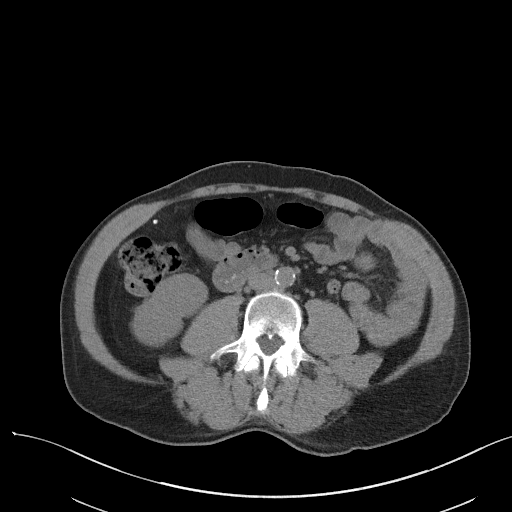
[im 60/90  soft-tissue]
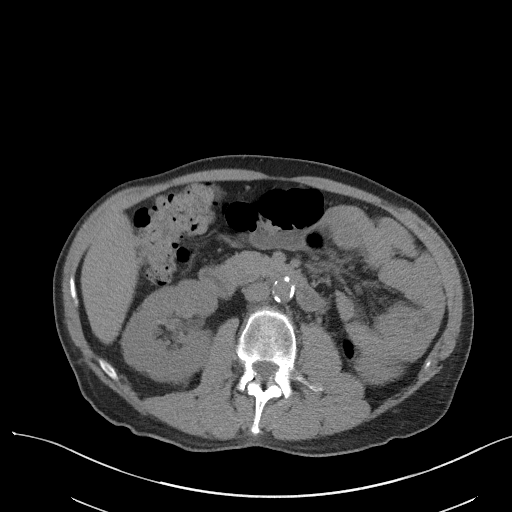
[im 60/90  bone]
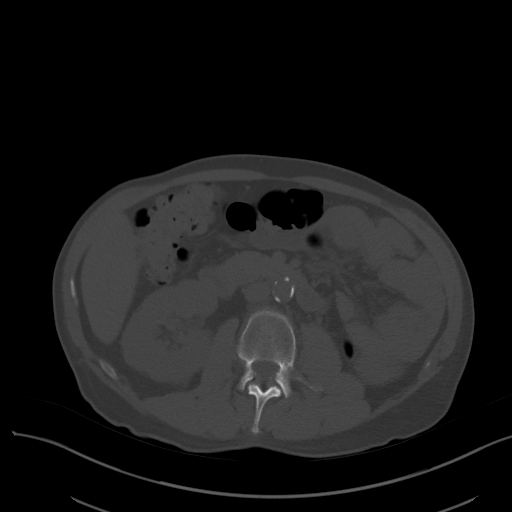
[im 64/90  soft-tissue]
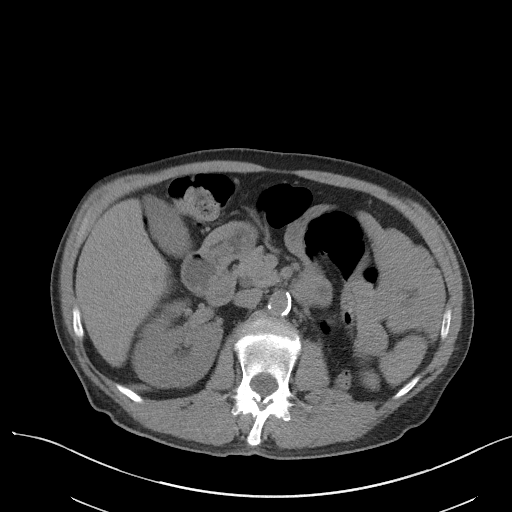
[im 71/90  soft-tissue]
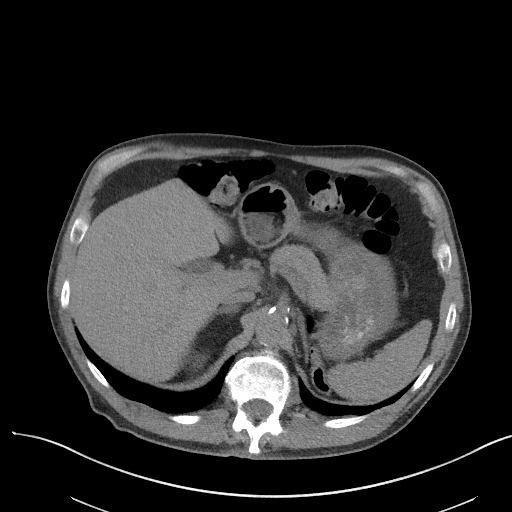
[im 78/90  soft-tissue]
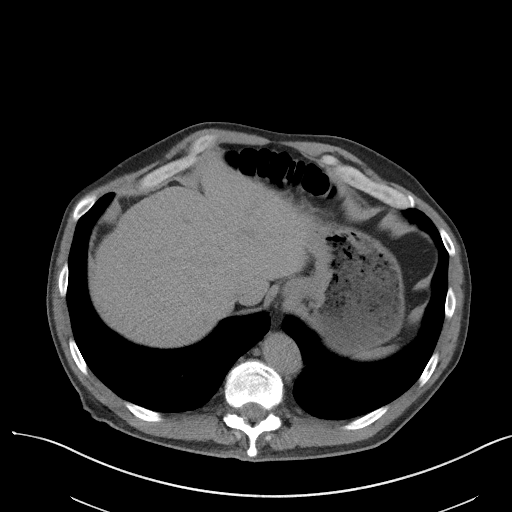
[im 86/90  soft-tissue]
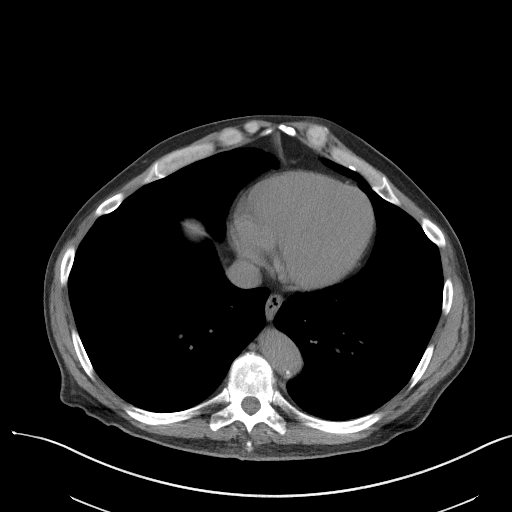

[Series 5: coronal st · coronal · 0.74mm/px · 3 of 95 slices shown]
[im 32/95  soft-tissue]
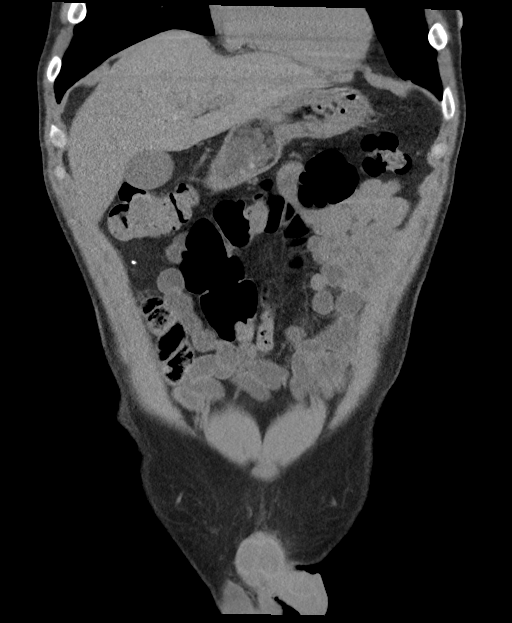
[im 42/95  soft-tissue]
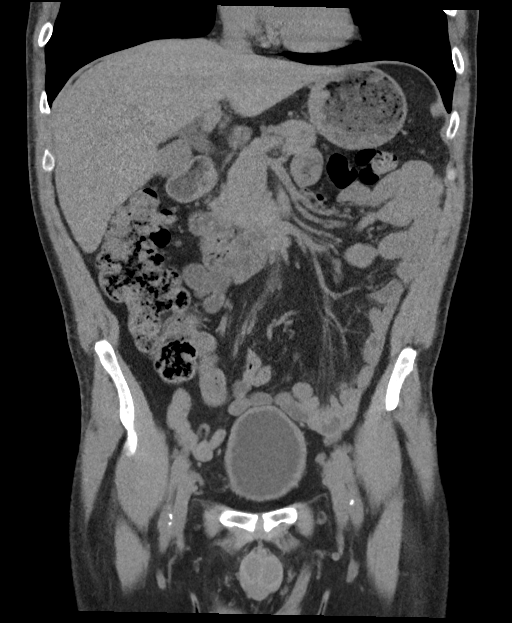
[im 53/95  soft-tissue]
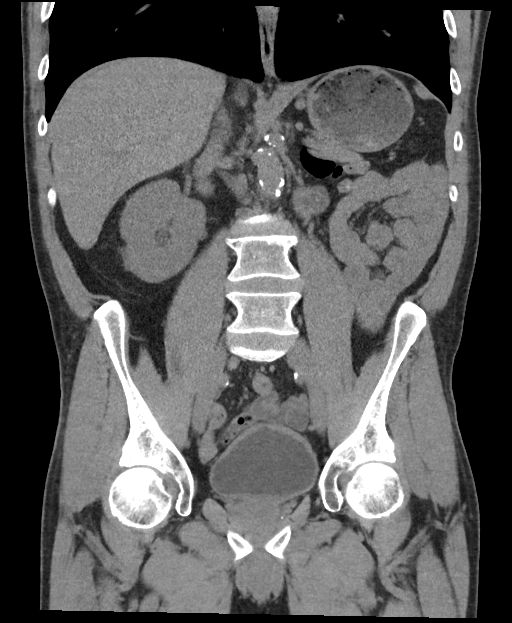

[16 of 46 positions shown; findings below may reference images not displayed]

FINDINGS: Lower chest: Lung bases are clear.

Hepatobiliary: No focal liver abnormality is seen. No gallstones,
gallbladder wall thickening, or biliary dilatation.

Pancreas: Unremarkable. No pancreatic ductal dilatation or
surrounding inflammatory changes.

Spleen: Normal in size without focal abnormality.

Adrenals/Urinary Tract: No adrenal gland nodules. Absent left
kidney, either congenital or postoperative. Compensatory hypertrophy
of the right kidney. No hydronephrosis or hydroureter. No renal or
ureteral stones. Bladder wall is mildly thickened which may indicate
cystitis. Correlate with urinalysis.

Stomach/Bowel: Stomach is within normal limits. Appendix appears
normal. No evidence of bowel wall thickening, distention, or
inflammatory changes.

Vascular/Lymphatic: Aortic atherosclerosis. No enlarged abdominal or
pelvic lymph nodes.

Reproductive: Prostate is unremarkable.

Other: Small right inguinal hernia containing fat. No free air or
free fluid in the abdomen.

Musculoskeletal: Degenerative changes in the spine. Old compression
deformities at L1, T12, T11, and T10. Mild sclerosis in the superior
aspect of the femoral heads bilaterally, greater on the right. This
may indicate early avascular necrosis.
IMPRESSION: 1. No evidence of bowel obstruction or inflammation.
2. Absence of the left kidney.  Right kidney is normal.
3. Aortic atherosclerosis.
4. Sclerosis in the superior femoral heads may indicate avascular
necrosis. Old compression deformities at multiple thoracolumbar
vertebral levels.

## 2022-08-10 NOTE — Congregational Nurse Program (Signed)
  Dept: [facility] Nurse Program Note  Date of Encounter: 08/10/2022 Client to clinic for first aide to an insect bite on his right upper arm. Anti-itch cream applied. Support given. No other needs at this time. Past Medical History: Past Medical History:  Diagnosis Date   Asthma    Gout    Osteoporosis    Prostate cancer [facility])     Encounter Details:  CNP Questionnaire - 08/10/22 0910       Questionnaire   Do you give verbal consent to treat you today? Yes    Location Patient [place] or Organization    Patient Status Unknown   now has an apartment at [place] in [place] [employer],, [employer]   Insurance Referral N/A    Medication N/A   reports today he has money for copays on medications   Medical Provider No   discussed PCP, he would like to go to [facility], hje plans to make his appointments himself   Screening Referrals N/A    Medical Referral N/A    Medical Appointment [place] Insecurities   has food stamps   Transportation N/A   uses the [place] bus system currenlty   Housing/Utilities N/A   now has an apartment   Chiropractor N/A    Intervention Support;Educate    ED Visit Averted N/A    Life-Saving Intervention Made N/A

## 2022-08-25 DIAGNOSIS — M5416 Radiculopathy, lumbar region: Secondary | ICD-10-CM | POA: Diagnosis not present

## 2022-08-25 DIAGNOSIS — R6889 Other general symptoms and signs: Secondary | ICD-10-CM | POA: Diagnosis not present

## 2022-11-18 DIAGNOSIS — J069 Acute upper respiratory infection, unspecified: Secondary | ICD-10-CM | POA: Diagnosis not present

## 2022-11-18 DIAGNOSIS — R059 Cough, unspecified: Secondary | ICD-10-CM | POA: Diagnosis not present

## 2023-03-01 ENCOUNTER — Other Ambulatory Visit: Payer: Self-pay

## 2023-03-01 ENCOUNTER — Emergency Department
Admission: EM | Admit: 2023-03-01 | Discharge: 2023-03-01 | Disposition: A | Discharge status: Home / Self Care | Payer: Medicare HMO | Source: Home / Self Care | Attending: Emergency Medicine | Admitting: Emergency Medicine

## 2023-03-01 ENCOUNTER — Emergency Department: Payer: Medicare HMO

## 2023-03-01 DIAGNOSIS — F32A Depression, unspecified: Secondary | ICD-10-CM | POA: Diagnosis not present

## 2023-03-01 DIAGNOSIS — Z716 Tobacco abuse counseling: Secondary | ICD-10-CM | POA: Insufficient documentation

## 2023-03-01 DIAGNOSIS — M549 Dorsalgia, unspecified: Secondary | ICD-10-CM | POA: Diagnosis present

## 2023-03-01 DIAGNOSIS — R1084 Generalized abdominal pain: Secondary | ICD-10-CM | POA: Insufficient documentation

## 2023-03-01 DIAGNOSIS — Z7952 Long term (current) use of systemic steroids: Secondary | ICD-10-CM | POA: Diagnosis not present

## 2023-03-01 DIAGNOSIS — Z79899 Other long term (current) drug therapy: Secondary | ICD-10-CM | POA: Diagnosis not present

## 2023-03-01 DIAGNOSIS — J45909 Unspecified asthma, uncomplicated: Secondary | ICD-10-CM | POA: Diagnosis not present

## 2023-03-01 DIAGNOSIS — R059 Cough, unspecified: Secondary | ICD-10-CM | POA: Diagnosis not present

## 2023-03-01 DIAGNOSIS — E871 Hypo-osmolality and hyponatremia: Secondary | ICD-10-CM | POA: Diagnosis not present

## 2023-03-01 DIAGNOSIS — F17210 Nicotine dependence, cigarettes, uncomplicated: Secondary | ICD-10-CM | POA: Diagnosis present

## 2023-03-01 DIAGNOSIS — R300 Dysuria: Secondary | ICD-10-CM | POA: Diagnosis not present

## 2023-03-01 DIAGNOSIS — F419 Anxiety disorder, unspecified: Secondary | ICD-10-CM | POA: Diagnosis present

## 2023-03-01 DIAGNOSIS — C61 Malignant neoplasm of prostate: Secondary | ICD-10-CM | POA: Insufficient documentation

## 2023-03-01 DIAGNOSIS — R0789 Other chest pain: Secondary | ICD-10-CM | POA: Diagnosis not present

## 2023-03-01 DIAGNOSIS — M109 Gout, unspecified: Secondary | ICD-10-CM | POA: Diagnosis not present

## 2023-03-01 DIAGNOSIS — I700 Atherosclerosis of aorta: Secondary | ICD-10-CM | POA: Diagnosis not present

## 2023-03-01 DIAGNOSIS — Z880 Allergy status to penicillin: Secondary | ICD-10-CM | POA: Diagnosis not present

## 2023-03-01 DIAGNOSIS — R45851 Suicidal ideations: Secondary | ICD-10-CM | POA: Diagnosis not present

## 2023-03-01 DIAGNOSIS — I160 Hypertensive urgency: Secondary | ICD-10-CM | POA: Diagnosis not present

## 2023-03-01 DIAGNOSIS — F10230 Alcohol dependence with withdrawal, uncomplicated: Secondary | ICD-10-CM | POA: Diagnosis not present

## 2023-03-01 DIAGNOSIS — F1024 Alcohol dependence with alcohol-induced mood disorder: Secondary | ICD-10-CM | POA: Diagnosis not present

## 2023-03-01 DIAGNOSIS — R109 Unspecified abdominal pain: Secondary | ICD-10-CM | POA: Diagnosis not present

## 2023-03-01 DIAGNOSIS — I10 Essential (primary) hypertension: Secondary | ICD-10-CM | POA: Diagnosis not present

## 2023-03-01 DIAGNOSIS — R140 Abdominal distension (gaseous): Secondary | ICD-10-CM | POA: Diagnosis not present

## 2023-03-01 DIAGNOSIS — F1014 Alcohol abuse with alcohol-induced mood disorder: Secondary | ICD-10-CM | POA: Diagnosis not present

## 2023-03-01 DIAGNOSIS — Z5900 Homelessness unspecified: Secondary | ICD-10-CM | POA: Diagnosis not present

## 2023-03-01 DIAGNOSIS — R069 Unspecified abnormalities of breathing: Secondary | ICD-10-CM | POA: Diagnosis not present

## 2023-03-01 DIAGNOSIS — F10930 Alcohol use, unspecified with withdrawal, uncomplicated: Secondary | ICD-10-CM | POA: Diagnosis not present

## 2023-03-01 DIAGNOSIS — E538 Deficiency of other specified B group vitamins: Secondary | ICD-10-CM | POA: Diagnosis present

## 2023-03-01 DIAGNOSIS — G8929 Other chronic pain: Secondary | ICD-10-CM | POA: Diagnosis present

## 2023-03-01 DIAGNOSIS — F10239 Alcohol dependence with withdrawal, unspecified: Secondary | ICD-10-CM | POA: Diagnosis not present

## 2023-03-01 LAB — URINALYSIS, ROUTINE W REFLEX MICROSCOPIC
Bilirubin Urine: NEGATIVE
Glucose, UA: NEGATIVE mg/dL
Hgb urine dipstick: NEGATIVE
Ketones, ur: NEGATIVE mg/dL
Leukocytes,Ua: NEGATIVE
Nitrite: NEGATIVE
Protein, ur: NEGATIVE mg/dL
Specific Gravity, Urine: 1.003 — ABNORMAL LOW (ref 1.005–1.030)
pH: 7 (ref 5.0–8.0)

## 2023-03-01 LAB — COMPREHENSIVE METABOLIC PANEL
ALT: 13 U/L (ref 0–44)
AST: 22 U/L (ref 15–41)
Albumin: 4.1 g/dL (ref 3.5–5.0)
Alkaline Phosphatase: 46 U/L (ref 38–126)
Anion gap: 12 (ref 5–15)
BUN: 7 mg/dL (ref 6–20)
CO2: 19 mmol/L — ABNORMAL LOW (ref 22–32)
Calcium: 8.3 mg/dL — ABNORMAL LOW (ref 8.9–10.3)
Chloride: 97 mmol/L — ABNORMAL LOW (ref 98–111)
Creatinine, Ser: 0.7 mg/dL (ref 0.61–1.24)
GFR, Estimated: >60 mL/min (ref >60)
Glucose, Bld: 82 mg/dL (ref 70–99)
Potassium: 3.9 mmol/L (ref 3.5–5.1)
Sodium: 128 mmol/L — ABNORMAL LOW (ref 135–145)
Total Bilirubin: 0.8 mg/dL (ref 0.3–1.2)
Total Protein: 7 g/dL (ref 6.5–8.1)

## 2023-03-01 LAB — CBC
HCT: 46.7 % (ref 39.0–52.0)
Hemoglobin: 16.4 g/dL (ref 13.0–17.0)
MCH: 33.3 pg (ref 26.0–34.0)
MCHC: 35.1 g/dL (ref 30.0–36.0)
MCV: 94.9 fL (ref 80.0–100.0)
Platelets: 245 10*3/uL (ref 150–400)
RBC: 4.92 MIL/uL (ref 4.22–5.81)
RDW: 13.2 % (ref 11.5–15.5)
WBC: 5.9 10*3/uL (ref 4.0–10.5)
nRBC: 0 % (ref 0.0–0.2)

## 2023-03-01 LAB — TROPONIN I (HIGH SENSITIVITY)
Troponin I (High Sensitivity): 4 ng/L (ref <18)
Troponin I (High Sensitivity): 5 ng/L (ref <18)

## 2023-03-01 LAB — LIPASE, BLOOD: Lipase: 30 U/L (ref 11–51)

## 2023-03-01 MED ORDER — ONDANSETRON HCL 4 MG/2ML IJ SOLN
4.0000 mg | Freq: Once | INTRAMUSCULAR | Status: AC
  Administered 2023-03-01: 4 mg via INTRAVENOUS
  Filled 2023-03-01: qty 2

## 2023-03-01 MED ORDER — LORAZEPAM 1 MG PO TABS
1.0000 mg | ORAL_TABLET | Freq: Once | ORAL | Status: AC
  Administered 2023-03-01: 1 mg via ORAL
  Filled 2023-03-01: qty 1

## 2023-03-01 MED ORDER — POLYETHYLENE GLYCOL 3350 17 GM/SCOOP PO POWD
17.0000 g | Freq: Every day | ORAL | 0 refills | Status: DC
  Filled 2023-03-01: qty 238, 14d supply, fill #0

## 2023-03-01 MED ORDER — NICOTINE 21 MG/24HR TD PT24: 21.0000 mg | MEDICATED_PATCH | TRANSDERMAL | 3 refills | Status: DC

## 2023-03-01 MED ORDER — PANTOPRAZOLE SODIUM 40 MG IV SOLR
40.0000 mg | Freq: Once | INTRAVENOUS | Status: AC
  Administered 2023-03-01: 40 mg via INTRAVENOUS
  Filled 2023-03-01: qty 10

## 2023-03-01 MED ORDER — MORPHINE SULFATE (PF) 4 MG/ML IV SOLN
4.0000 mg | Freq: Once | INTRAVENOUS | Status: AC
  Administered 2023-03-01: 4 mg via INTRAVENOUS
  Filled 2023-03-01: qty 1

## 2023-03-01 MED ORDER — IOHEXOL 300 MG/ML  SOLN
100.0000 mL | Freq: Once | INTRAMUSCULAR | Status: AC | PRN
  Administered 2023-03-01: 100 mL via INTRAVENOUS

## 2023-03-01 MED ORDER — ACETAMINOPHEN 500 MG PO TABS
1000.0000 mg | ORAL_TABLET | Freq: Once | ORAL | Status: AC
  Administered 2023-03-01: 1000 mg via ORAL
  Filled 2023-03-01: qty 2

## 2023-03-01 MED ORDER — NICOTINE POLACRILEX 4 MG MT LOZG: 4.0000 mg | LOZENGE | OROMUCOSAL | 0 refills | Status: DC | PRN

## 2023-03-01 MED ORDER — POLYETHYLENE GLYCOL 3350 17 GM/SCOOP PO POWD: 17.0000 g | Freq: Every day | ORAL | 0 refills | Status: DC

## 2023-03-01 NOTE — ED Notes (Signed)
Pt verbalizes understanding of discharge instructions. Opportunity for questioning and answers were provided. Pt discharged from ED to home. Patient states that he gets home by taking the bus

## 2023-03-01 NOTE — Discharge Instructions (Addendum)
Call Dr Richardo Hanks of urology to further assess your prostate cancer.     Take MiraLAX daily as prescribed.  Use nicotine replacement as prescribed.  Thank you for choosing [place] for your health care today!  Please see your primary doctor this week for a follow up appointment.   Sometimes, in the early stages of certain disease courses it is difficult to detect in the emergency department evaluation -- so, it is important that you continue to monitor your symptoms and call your doctor right away or return to the emergency department if you develop any new or worsening symptoms.  Please go to the following website to schedule new (and existing) patient appointments:   [email or web address]  If you do not have a primary doctor try calling the following clinics to establish care:  If you have insurance:  [facility] [phone] [address] [place] [address]   [facility]  [phone] [address] [place] [place] [address]   If you do not have insurance:  [facility]  [phone] [address], [place] [address]   The following is another list of primary care offices in the area who are accepting new patients at this time.  Please reach out to one of them directly and let them know you would like to schedule an appointment to follow up on an Emergency Department visit, and/or to establish a new primary care provider (PCP).  There are likely other primary care clinics in the are who are accepting new patients, but this is an excellent place to start:  [facility] Lead physician: Dr Shirlee Latch [address] [place], [place] [address] [phone]  [facility] Lead Physician: Dr Alba Cory [address], [place], [place] [address] [phone]  [facility]  Lead Physician: Dr Olevia Perches [address] [place], [place] [address] [phone]  [facility] Lead Physician: Dr Sofie Hartigan [address] [place], [place], [place] [address] [phone]  [facility] at [facility] Lead Physician: Dr Bari Edward [address] [place], [place] [address] [phone]   It was my pleasure to care for you today.   Daneil Dan Modesto Charon, MD

## 2023-03-01 NOTE — ED Triage Notes (Signed)
Pt to ED [facility] from home for painful urination x1 month. Hx prostate cancer with no treatment. Also reports generalized abd pain.  Every day drinker, last drink this AM

## 2023-03-01 NOTE — ED Provider Notes (Signed)
[facility] Provider Note    Event Date/Time   First MD Initiated Contact with Patient 03/01/23 1131     (approximate)   History   Dysuria   HPI  Frank Terry is a 57 y.o. male   Past medical history of prostate cancer not undergoing treatment who presents emergency department with difficulty urinating, weak stream, abdominal discomfort for the last several days.  He has had some nausea and vomiting.  He is a daily drinker and his last alcoholic beverage was earlier this morning prior to coming to the emergency department.  Some diarrhea but no GI bleeding.  He has not had testicular pain.  His last appointment with urologist was over 2 years ago.  During my evaluation he developed left-sided chest pain nonexertional nonradiating.  Not associated with shortness of breath.  No respiratory infectious symptoms.  He reports unintended weight loss  External Medical Documents Reviewed: Office visit from May 2023 at family medicine addressing his hypertension, weight loss, urinary urgency and at that time had an ambulatory referral to urology      Physical Exam   Triage Vital Signs: ED Triage Vitals  Enc Vitals Group     BP 03/01/23 0953 (!) 166/112     Pulse Rate 03/01/23 0953 76     Resp 03/01/23 0953 20     Temp 03/01/23 0953 97.6 F (36.4 C)     Temp src --      SpO2 03/01/23 0953 100 %     Weight 03/01/23 0955 175 lb (79.4 kg)     Height 03/01/23 0955 5\' 8"  (1.727 m)     Head Circumference --      Peak Flow --      Pain Score 03/01/23 0954 7     Pain Loc --      Pain Edu? --      Excl. in GC? --     Most recent vital signs: Vitals:   03/01/23 1430 03/01/23 1500  BP: (!) 158/105 136/87  Pulse: 72 76  Resp: 14 16  Temp:    SpO2: 96% 94%    General: Awake, no distress.  CV:  Good peripheral perfusion.  Resp:  Normal effort.  Abd:  No distention.  Other:  Awake alert comfortable appearing abdomen soft but has diffuse tenderness  throughout.  Lungs clear.  Skin appears warm well-perfused.   ED Results / Procedures / Treatments   Labs (all labs ordered are listed, but only abnormal results are displayed) Labs Reviewed  URINALYSIS, ROUTINE W REFLEX MICROSCOPIC - Abnormal; Notable for the following components:      Result Value   Color, Urine COLORLESS (*)    APPearance CLEAR (*)    Specific Gravity, Urine 1.003 (*)    All other components within normal limits  COMPREHENSIVE METABOLIC PANEL - Abnormal; Notable for the following components:   Sodium 128 (*)    Chloride 97 (*)    CO2 19 (*)    Calcium 8.3 (*)    All other components within normal limits  CBC  LIPASE, BLOOD  TROPONIN I (HIGH SENSITIVITY)  TROPONIN I (HIGH SENSITIVITY)     I ordered and reviewed the above labs they are notable for no bacteria or white blood cells in his urine.  Flat troponins x 2  EKG  ED ECG REPORT I, Pilar Jarvis, the attending physician, personally viewed and interpreted this ECG.   Date: 03/01/2023  EKG Time: 1206  Rate:  78  Rhythm: nsr  Axis: nl  Intervals:none  ST&T Change: no stemi    RADIOLOGY I independently reviewed and interpreted CT of the abdomen pelvis and see no obvious obstructive pathologies   PROCEDURES:  Critical Care performed: No  Procedures   MEDICATIONS ORDERED IN ED: Medications  ondansetron (ZOFRAN) injection 4 mg (4 mg Intravenous Given 03/01/23 1200)  morphine (PF) 4 MG/ML injection 4 mg (4 mg Intravenous Given 03/01/23 1200)  pantoprazole (PROTONIX) injection 40 mg (40 mg Intravenous Given 03/01/23 1221)  LORazepam (ATIVAN) tablet 1 mg (1 mg Oral Given 03/01/23 1328)  iohexol (OMNIPAQUE) 300 MG/ML solution 100 mL (100 mLs Intravenous Contrast Given 03/01/23 1309)  acetaminophen (TYLENOL) tablet 1,000 mg (1,000 mg Oral Given 03/01/23 1420)     IMPRESSION / MDM / ASSESSMENT AND PLAN / ED COURSE  I reviewed the triage vital signs and the nursing notes.                                 Patient's presentation is most consistent with acute presentation with potential threat to life or bodily function.  Differential diagnosis includes, but is not limited to, intra-abdominal infection like appendicitis, cholecystitis, obstruction, acute urinary retention, urinary tract infection, progression of prostate cancer, ACS   The patient is on the cardiac monitor to evaluate for evidence of arrhythmia and/or significant heart rate changes.  MDM: This patient with urinary symptoms history of prostate cancer and diffuse abdominal tenderness.  Check urinalysis for infection, check CT scan of the abdomen pelvis for surgical pathologies, and he developed chest pain will check EKG and serial troponins for ACS.  Fortunately no surgical pathologies on his CT scan though it does show signs consistent with prostate cancer, known.  I asked the patient to follow-up this week with urology and reestablish care to address his prostate cancer.  EKG was nonischemic and serial troponins x 2 flat.  He feels much more comfortable now and reexamination of his abdomen benign exam, chest pain resolved.  I considered hospitalization for admission or observation however given now comfortable in the emergency department after some treatment and evaluation as above negative for emergent pathology think outpatient follow-up most appropriate at this time.  He was given strict return precautions for any new or worsening symptoms.   I spent 5 minutes counseling this patient on smoking cessation.  We spoke about the patient's current tobacco use, impact of smoking, assessed willingness to quit, methods for cessation including medical management and nicotine replacement therapy (which I prescribed to the patient) and advised follow-up with primary doctor to continue to address smoking cessation.         FINAL CLINICAL IMPRESSION(S) / ED DIAGNOSES   Final diagnoses:  Prostate cancer  Generalized abdominal pain   Encounter for smoking cessation counseling     Rx / DC Orders   ED Discharge Orders          Ordered    polyethylene glycol powder (GNP CLEARLAX) 17 GM/SCOOP powder  Daily,   Status:  Discontinued        03/01/23 1359    nicotine (NICODERM CQ - DOSED IN MG/24 HOURS) 21 mg/24hr patch  Every 24 hours        03/01/23 1519    nicotine polacrilex (NICOTINE MINI) 4 MG lozenge  As needed        03/01/23 1519    polyethylene glycol powder (GNP CLEARLAX) 17 GM/SCOOP  powder  Daily        03/01/23 1519             Note:  This document was prepared using Dragon voice recognition software and may include unintentional dictation errors.    Pilar JarvisWong, Daysha Ashmore, MD 03/01/23 [phone]

## 2023-03-02 ENCOUNTER — Other Ambulatory Visit: Payer: Self-pay

## 2023-03-02 ENCOUNTER — Encounter: Payer: Self-pay | Admitting: Intensive Care

## 2023-03-02 ENCOUNTER — Inpatient Hospital Stay
Admission: EM | Admit: 2023-03-02 | Discharge: 2023-03-05 | DRG: 897 | Disposition: A | Discharge status: Home / Self Care | Payer: Medicare HMO | Attending: Internal Medicine | Admitting: Internal Medicine

## 2023-03-02 DIAGNOSIS — E538 Deficiency of other specified B group vitamins: Secondary | ICD-10-CM | POA: Diagnosis present

## 2023-03-02 DIAGNOSIS — I160 Hypertensive urgency: Secondary | ICD-10-CM | POA: Diagnosis present

## 2023-03-02 DIAGNOSIS — J45909 Unspecified asthma, uncomplicated: Secondary | ICD-10-CM

## 2023-03-02 DIAGNOSIS — Z880 Allergy status to penicillin: Secondary | ICD-10-CM | POA: Diagnosis not present

## 2023-03-02 DIAGNOSIS — Z7952 Long term (current) use of systemic steroids: Secondary | ICD-10-CM | POA: Diagnosis not present

## 2023-03-02 DIAGNOSIS — G8929 Other chronic pain: Secondary | ICD-10-CM | POA: Diagnosis present

## 2023-03-02 DIAGNOSIS — M109 Gout, unspecified: Secondary | ICD-10-CM | POA: Diagnosis present

## 2023-03-02 DIAGNOSIS — F10930 Alcohol use, unspecified with withdrawal, uncomplicated: Secondary | ICD-10-CM

## 2023-03-02 DIAGNOSIS — F1014 Alcohol abuse with alcohol-induced mood disorder: Secondary | ICD-10-CM | POA: Diagnosis not present

## 2023-03-02 DIAGNOSIS — F10939 Alcohol use, unspecified with withdrawal, unspecified: Secondary | ICD-10-CM | POA: Diagnosis present

## 2023-03-02 DIAGNOSIS — I10 Essential (primary) hypertension: Secondary | ICD-10-CM | POA: Diagnosis present

## 2023-03-02 DIAGNOSIS — F1010 Alcohol abuse, uncomplicated: Secondary | ICD-10-CM | POA: Diagnosis present

## 2023-03-02 DIAGNOSIS — F1024 Alcohol dependence with alcohol-induced mood disorder: Principal | ICD-10-CM | POA: Diagnosis present

## 2023-03-02 DIAGNOSIS — F419 Anxiety disorder, unspecified: Secondary | ICD-10-CM | POA: Diagnosis present

## 2023-03-02 DIAGNOSIS — F32A Depression, unspecified: Secondary | ICD-10-CM | POA: Diagnosis present

## 2023-03-02 DIAGNOSIS — F17210 Nicotine dependence, cigarettes, uncomplicated: Secondary | ICD-10-CM | POA: Diagnosis present

## 2023-03-02 DIAGNOSIS — F1090 Alcohol use, unspecified, uncomplicated: Secondary | ICD-10-CM | POA: Diagnosis present

## 2023-03-02 DIAGNOSIS — E871 Hypo-osmolality and hyponatremia: Secondary | ICD-10-CM

## 2023-03-02 DIAGNOSIS — R45851 Suicidal ideations: Secondary | ICD-10-CM | POA: Diagnosis present

## 2023-03-02 DIAGNOSIS — Z79899 Other long term (current) drug therapy: Secondary | ICD-10-CM | POA: Diagnosis not present

## 2023-03-02 DIAGNOSIS — Z5900 Homelessness unspecified: Secondary | ICD-10-CM | POA: Diagnosis not present

## 2023-03-02 DIAGNOSIS — F10230 Alcohol dependence with withdrawal, uncomplicated: Secondary | ICD-10-CM | POA: Diagnosis present

## 2023-03-02 DIAGNOSIS — C61 Malignant neoplasm of prostate: Secondary | ICD-10-CM

## 2023-03-02 DIAGNOSIS — R079 Chest pain, unspecified: Secondary | ICD-10-CM | POA: Diagnosis not present

## 2023-03-02 DIAGNOSIS — K259 Gastric ulcer, unspecified as acute or chronic, without hemorrhage or perforation: Secondary | ICD-10-CM | POA: Clinically undetermined

## 2023-03-02 DIAGNOSIS — M549 Dorsalgia, unspecified: Secondary | ICD-10-CM | POA: Diagnosis present

## 2023-03-02 LAB — COMPREHENSIVE METABOLIC PANEL
ALT: 21 U/L (ref 0–44)
AST: 41 U/L (ref 15–41)
Albumin: 4.8 g/dL (ref 3.5–5.0)
Alkaline Phosphatase: 57 U/L (ref 38–126)
Anion gap: 14 (ref 5–15)
BUN: 6 mg/dL (ref 6–20)
CO2: 22 mmol/L (ref 22–32)
Calcium: 9.7 mg/dL (ref 8.9–10.3)
Chloride: 90 mmol/L — ABNORMAL LOW (ref 98–111)
Creatinine, Ser: 0.79 mg/dL (ref 0.61–1.24)
GFR, Estimated: >60 mL/min (ref >60)
Glucose, Bld: 116 mg/dL — ABNORMAL HIGH (ref 70–99)
Potassium: 3.8 mmol/L (ref 3.5–5.1)
Sodium: 126 mmol/L — ABNORMAL LOW (ref 135–145)
Total Bilirubin: 1.5 mg/dL — ABNORMAL HIGH (ref 0.3–1.2)
Total Protein: 8.1 g/dL (ref 6.5–8.1)

## 2023-03-02 LAB — CBC
HCT: 45.2 % (ref 39.0–52.0)
Hemoglobin: 16 g/dL (ref 13.0–17.0)
MCH: 33.1 pg (ref 26.0–34.0)
MCHC: 35.4 g/dL (ref 30.0–36.0)
MCV: 93.4 fL (ref 80.0–100.0)
Platelets: 219 10*3/uL (ref 150–400)
RBC: 4.84 MIL/uL (ref 4.22–5.81)
RDW: 12.9 % (ref 11.5–15.5)
WBC: 9.3 10*3/uL (ref 4.0–10.5)
nRBC: 0 % (ref 0.0–0.2)

## 2023-03-02 LAB — URINE DRUG SCREEN, QUALITATIVE (ARMC ONLY)
Amphetamines, Ur Screen: NOT DETECTED
Barbiturates, Ur Screen: NOT DETECTED
Benzodiazepine, Ur Scrn: NOT DETECTED
Cannabinoid 50 Ng, Ur ~~LOC~~: POSITIVE — AB
Cocaine Metabolite,Ur ~~LOC~~: NOT DETECTED
MDMA (Ecstasy)Ur Screen: NOT DETECTED
Methadone Scn, Ur: NOT DETECTED
Opiate, Ur Screen: NOT DETECTED
Phencyclidine (PCP) Ur S: NOT DETECTED
Tricyclic, Ur Screen: NOT DETECTED

## 2023-03-02 LAB — ETHANOL: Alcohol, Ethyl (B): <10 mg/dL (ref <10)

## 2023-03-02 LAB — SODIUM
Sodium: 125 mmol/L — ABNORMAL LOW (ref 135–145)
Sodium: 127 mmol/L — ABNORMAL LOW (ref 135–145)

## 2023-03-02 LAB — HIV ANTIBODY (ROUTINE TESTING W REFLEX): HIV Screen 4th Generation wRfx: NONREACTIVE

## 2023-03-02 MED ORDER — NICOTINE 21 MG/24HR TD PT24
21.0000 mg | MEDICATED_PATCH | Freq: Every day | TRANSDERMAL | Status: DC
  Administered 2023-03-02 – 2023-03-05 (×4): 21 mg via TRANSDERMAL
  Filled 2023-03-02 (×4): qty 1

## 2023-03-02 MED ORDER — LORAZEPAM 2 MG/ML IJ SOLN
1.0000 mg | INTRAMUSCULAR | Status: AC | PRN
  Administered 2023-03-04 (×3): 2 mg via INTRAVENOUS
  Filled 2023-03-02 (×3): qty 1

## 2023-03-02 MED ORDER — ONDANSETRON HCL 4 MG/2ML IJ SOLN: 4.0000 mg | Freq: Four times a day (QID) | INTRAMUSCULAR | Status: DC | PRN

## 2023-03-02 MED ORDER — ACETAMINOPHEN 500 MG PO TABS
1000.0000 mg | ORAL_TABLET | Freq: Four times a day (QID) | ORAL | Status: DC | PRN
  Administered 2023-03-02: 1000 mg via ORAL
  Filled 2023-03-02: qty 2

## 2023-03-02 MED ORDER — THIAMINE HCL 100 MG/ML IJ SOLN
100.0000 mg | Freq: Every day | INTRAMUSCULAR | Status: DC
  Filled 2023-03-02: qty 2

## 2023-03-02 MED ORDER — ADULT MULTIVITAMIN W/MINERALS CH
1.0000 | ORAL_TABLET | Freq: Every day | ORAL | Status: DC
  Administered 2023-03-02 – 2023-03-05 (×4): 1 via ORAL
  Filled 2023-03-02 (×4): qty 1

## 2023-03-02 MED ORDER — LORAZEPAM 1 MG PO TABS
1.0000 mg | ORAL_TABLET | ORAL | Status: AC | PRN
  Administered 2023-03-02: 2 mg via ORAL
  Administered 2023-03-02: 1 mg via ORAL
  Administered 2023-03-02: 4 mg via ORAL
  Administered 2023-03-03: 2 mg via ORAL
  Filled 2023-03-02: qty 4
  Filled 2023-03-02: qty 2
  Filled 2023-03-02: qty 1
  Filled 2023-03-02: qty 2

## 2023-03-02 MED ORDER — FOLIC ACID 1 MG PO TABS
1.0000 mg | ORAL_TABLET | Freq: Every day | ORAL | Status: DC
  Administered 2023-03-02 – 2023-03-05 (×4): 1 mg via ORAL
  Filled 2023-03-02 (×4): qty 1

## 2023-03-02 MED ORDER — SODIUM CHLORIDE 0.9 % IV SOLN: INTRAVENOUS | Status: DC

## 2023-03-02 MED ORDER — ENOXAPARIN SODIUM 40 MG/0.4ML IJ SOSY
40.0000 mg | PREFILLED_SYRINGE | INTRAMUSCULAR | Status: DC
  Administered 2023-03-02 – 2023-03-04 (×3): 40 mg via SUBCUTANEOUS
  Filled 2023-03-02 (×3): qty 0.4

## 2023-03-02 MED ORDER — THIAMINE MONONITRATE 100 MG PO TABS
100.0000 mg | ORAL_TABLET | Freq: Every day | ORAL | Status: DC
  Administered 2023-03-02 – 2023-03-05 (×4): 100 mg via ORAL
  Filled 2023-03-02 (×4): qty 1

## 2023-03-02 MED ORDER — ONDANSETRON HCL 4 MG PO TABS: 4.0000 mg | ORAL_TABLET | Freq: Four times a day (QID) | ORAL | Status: DC | PRN

## 2023-03-02 NOTE — ED Notes (Signed)
Floor RN came and picked up pt to take to the floor.  Pt does not appear to be in any acute distress at this time.  Belongings were transported w/ the pt.

## 2023-03-02 NOTE — ED Notes (Signed)
VOL/  CONSULT  DONE ?

## 2023-03-02 NOTE — Assessment & Plan Note (Signed)
>>  ASSESSMENT AND PLAN FOR ALCOHOL ABUSE WITH ALCOHOL-INDUCED MOOD DISORDER (HCC) WRITTEN ON 03/02/2023 12:32 PM BY Floydene Flock, MD  Initial complaints of SI  w/ ? Slittting throat  Formally evaluated by [facility]  Ruled out from a psychiatric standpoint

## 2023-03-02 NOTE — Assessment & Plan Note (Signed)
Not on xanthine oxidase inhibitor

## 2023-03-02 NOTE — ED Triage Notes (Signed)
Patient c/o alcohol withdrawal and SI thoughts. Denies plan. Reports SI thoughts X1 week.   Last alcohol drink yesterday evening. Reports marijuana use and last used 2 days ago

## 2023-03-02 NOTE — ED Notes (Signed)
Pt given dinner tray and beverage  

## 2023-03-02 NOTE — H&P (Signed)
History and Physical    Patient: Frank Terry NTI:[id number] DOB: 1966-08-17 DOA: 03/02/2023 DOS: the patient was seen and examined on 03/02/2023 PCP: Pcp, No  Patient coming from: Home  Chief Complaint:  Chief Complaint  Patient presents with   Mental Health Problem   Alcohol Problem   HPI: Frank Terry is a 57 y.o. male with medical history significant of asthma, gout, prostate cancer, ETOH abuse presenting w/ ETOH withdrawal, hyponatremia. Pt reports longstanding ETOH abuse. Pt drinks 24+ beers daily as well as intermittent hard liquor use.  Patient reportedly ran out of money and has not been able to drink for at least past 1 to 2 days.  Has had significant tremors, palpitations, malaise.  Initially had thoughts of wanting to slit his throat.  Patient then realized how he felt was likely from being without alcohol.  Patient removed all of his knives.  Reports remote history of suicidal ideation in the past several years ago.  No reported HI.  No fevers or chills.  Positive nausea.  No chest pain or shortness of breath.  Mild headache.  No hemiparesis or confusion. Presented to the ER afebrile, blood pressures 160s to 180s over 90s-100s.  White count 9.3, hemoglobin 16, platelet count 219, sodium 126, creatinine 0.8.  T. bili 1.5.  UDS positive for marijuana.  CT abdomen pelvis grossly stable apart from?  Enlarged prostate versus prostatitis.  Had formal behavioral health assessment with no need for psychiatric evaluation per report. Review of Systems: As mentioned in the history of present illness. All other systems reviewed and are negative. Past Medical History:  Diagnosis Date   Asthma    Gout    Osteoporosis    Prostate cancer    History reviewed. No pertinent surgical history. Social History:  reports that he has been smoking cigarettes. He has never used smokeless tobacco. He reports that he does not currently use alcohol after a past usage of about 168.0 standard drinks of alcohol  per week. He reports current drug use. Drug: Marijuana.  Allergies  Allergen Reactions   Penicillins     History reviewed. No pertinent family history.  Prior to Admission medications   Medication Sig Start Date End Date Taking? Authorizing Provider  albuterol (VENTOLIN HFA) 108 (90 Base) MCG/ACT inhaler Inhale 2 puffs into the lungs every 6 (six) hours as needed for wheezing.    [provider]  colchicine 0.6 MG tablet Take 1.2 mg by mouth daily.    [provider]  nicotine (NICODERM CQ - DOSED IN MG/24 HOURS) 21 mg/24hr patch Place 1 patch (21 mg total) onto the skin daily. Patient not taking: Reported on 03/02/2023 03/01/23 02/29/24  Pilar Jarvis, MD  nicotine polacrilex (NICOTINE MINI) 4 MG lozenge Take 1 lozenge (4 mg total) by mouth as needed for smoking cessation. Patient not taking: Reported on 03/02/2023 03/01/23   Pilar Jarvis, MD  polyethylene glycol powder (GNP CLEARLAX) 17 GM/SCOOP powder Take 17 g by mouth daily. Patient not taking: Reported on 03/02/2023 03/01/23   Pilar Jarvis, MD    Physical Exam: Vitals:   03/02/23 0857 03/02/23 0901 03/02/23 0950 03/02/23 1202  BP: (!) 186/109  (!) 186/109 (!) 167/98  Pulse: 94  94 79  Resp: 18   17  Temp: 97.9 F (36.6 C)   98 F (36.7 C)  TempSrc: Oral   Oral  SpO2: 97%   99%  Weight:  79.4 kg    Height:  5\' 8"  (1.727 m)  Physical Exam Constitutional:      Appearance: He is normal weight.  HENT:     Head: Normocephalic.     Nose: Nose normal.     Mouth/Throat:     Mouth: Mucous membranes are dry.  Eyes:     Pupils: Pupils are equal, round, and reactive to light.  Cardiovascular:     Rate and Rhythm: Normal rate and regular rhythm.  Pulmonary:     Effort: Pulmonary effort is normal.  Abdominal:     General: Bowel sounds are normal.  Musculoskeletal:        General: Normal range of motion.  Skin:    General: Skin is warm and dry.  Neurological:     General: No focal deficit present.   Psychiatric:        Mood and Affect: Mood normal.     Data Reviewed:  There are no new results to review at this time. CT Abdomen Pelvis W Contrast CLINICAL DATA:  Dysuria for 1 month. Prostate cancer with no treatment. Abdominal pain.  EXAM: CT ABDOMEN AND PELVIS WITH CONTRAST  TECHNIQUE: Multidetector CT imaging of the abdomen and pelvis was performed using the standard protocol following bolus administration of intravenous contrast.  RADIATION DOSE REDUCTION: This exam was performed according to the departmental dose-optimization program which includes automated exposure control, adjustment of the mA and/or kV according to patient size and/or use of iterative reconstruction technique.  CONTRAST:  OMNIPAQUE IOHEXOL 300 MG/ML  SOLN  COMPARISON:  05/18/2022  FINDINGS: Lower chest: Minimal motion degradation involving the lower chest. Clear lung bases. Normal heart size without pericardial or pleural effusion.  Hepatobiliary: Suspect mild hepatic steatosis. A too small to characterize right hepatic lobe lesion is similar and of doubtful clinical significance. Normal gallbladder, without biliary ductal dilatation.  Pancreas: Normal, without mass or ductal dilatation.  Spleen: Normal in size, without focal abnormality.  Adrenals/Urinary Tract: Normal adrenal glands. Left kidney is absent. Normal right kidney, without hydronephrosis. Normal urinary bladder.  Stomach/Bowel: Proximal gastric underdistention. Colonic stool burden suggests constipation. Normal terminal ileum and appendix. Normal small bowel.  Vascular/Lymphatic: Aortic atherosclerosis. No abdominopelvic adenopathy.  Reproductive: Heterogeneous right-sided prostatic hyperenhancement, including within the peripheral gland mid and apical regions on 82 through 84 of series 2.  Other: Tiny fat containing right inguinal hernia. Trace pelvic cul-de-sac fluid including on 71/2. This is decreased  compared to 05/18/2022. No free intraperitoneal air.  Musculoskeletal: 3 mm anterior left-sided T10 vertebral body sclerotic lesion is unchanged, nonspecific. Compression deformities at T10 through L1 are similar. Minimal ventral canal encroachment at T12 is unchanged. Mild disc bulge at the lumbosacral junction.  IMPRESSION: 1. Heterogeneous enhancement throughout the right side of the prostate. Nonspecific and most likely related to the submitted history of prostate cancer. Prostatitis could look similar. No other explanation for dysuria. 2.  Possible constipation. 3. Trace cul-de-sac fluid, decreased. 4. Nonspecific T10 tiny sclerotic lesion is unchanged. Otherwise, no evidence of metastatic disease from reported prostate cancer.  Electronically Signed   By: Jeronimo Greaves M.D.   On: 03/01/2023 13:35  Lab Results  Component Value Date   WBC 9.3 03/02/2023   HGB 16.0 03/02/2023   HCT 45.2 03/02/2023   MCV 93.4 03/02/2023   PLT 219 03/02/2023   Last metabolic panel Lab Results  Component Value Date   GLUCOSE 116 (H) 03/02/2023   NA 126 (L) 03/02/2023   K 3.8 03/02/2023   CL 90 (L) 03/02/2023   CO2 22 03/02/2023  BUN 6 03/02/2023   CREATININE 0.79 03/02/2023   GFRNONAA >60 03/02/2023   CALCIUM 9.7 03/02/2023   PROT 8.1 03/02/2023   ALBUMIN 4.8 03/02/2023   BILITOT 1.5 (H) 03/02/2023   ALKPHOS 57 03/02/2023   AST 41 03/02/2023   ALT 21 03/02/2023   ANIONGAP 14 03/02/2023    Assessment and Plan: * Alcohol abuse with alcohol-induced mood disorder Initial complaints of SI  w/ ? Slittting throat  Formally evaluated by [facility]  Ruled out from a psychiatric standpoint  Alcohol withdrawal Regular 24+ beer w/ intermittent liquor use  Zero alcohol x 24+ hours  + generalized tremors, palpitations  Start withdrawal protocol  Prn IV ativan  Thiamine, folate, multivitamin  Follow   Hyponatremia Na 126-128  Clinically dry  Gentle IVF  Serial Na  Check urine and serum  studies  Goal 6-8 mEq change over 12-24 hours  Follow       Advance Care Planning:   Code Status: Full Code   Consults: none   Family Communication: No family at the bedside   Greater than 50% was spent in counseling and coordination of care with patient Total encounter time 80 minutes or more   Severity of Illness: The appropriate patient status for this patient is INPATIENT. Inpatient status is judged to be reasonable and necessary in order to provide the required intensity of service to ensure the patient's safety. The patient's presenting symptoms, physical exam findings, and initial radiographic and laboratory data in the context of their chronic comorbidities is felt to place them at high risk for further clinical deterioration. Furthermore, it is not anticipated that the patient will be medically stable for discharge from the hospital within 2 midnights of admission.   * I certify that at the point of admission it is my clinical judgment that the patient will require inpatient hospital care spanning beyond 2 midnights from the point of admission due to high intensity of service, high risk for further deterioration and high frequency of surveillance required.*  Author: Floydene Flock, MD 03/02/2023 12:34 PM  For on call review [email or web address].

## 2023-03-02 NOTE — Assessment & Plan Note (Signed)
>>  ASSESSMENT AND PLAN FOR ALCOHOL WITHDRAWAL (HCC) WRITTEN ON 03/02/2023 12:28 PM BY Floydene Flock, MD  Regular 24+ beer w/ intermittent liquor use  Zero alcohol x 24+ hours  + generalized tremors, palpitations  Start withdrawal protocol  Prn IV ativan  Thiamine, folate, multivitamin  Follow

## 2023-03-02 NOTE — ED Notes (Signed)
Pt given a lunch tray. 

## 2023-03-02 NOTE — BH Assessment (Addendum)
Comprehensive Clinical Assessment (CCA) Screening, Triage and Referral Note  03/02/2023 Frank Terry [id number]  Chief Complaint:  Chief Complaint  Patient presents with   Mental Health Problem   Alcohol Problem   Visit Diagnosis: Alcohol Use Disorder  Frank Terry is a 57 year old male who presents to the ER seeking assistance for his alcohol use. Patient reports of drinking every day for the last four months. He drinks "a case of beer and some times more." His last drink was last night around midnight. His past symptoms of withdrawal have been the shakes, cold chills and shakes. Patient denies history of seizures, blackouts and DT's.                                                                                                                                                                                               During the interview, the patient was calm, cooperative and pleasant. He was able to provide appropriate answer to the questions. He denies SI/HI and A/H. He denies involvement the legal system and no history of violence.  Patient Reported Information How did you hear about [place]? Self  What Is the Reason for Your Visit/Call Today? Patient came to the ER seeking assistance with alcohol detox.  How Long Has This Been Causing You Problems? <Week  What Do You Feel Would Help You the Most Today? Alcohol or Drug Use Treatment   Have You Recently Had Any Thoughts About Hurting Yourself? Yes (Due to alcohol use. He have no intentions of ending his life or gestures.)  Are You Planning to Commit Suicide/Harm Yourself At This time? No   Have you Recently Had Thoughts About Hurting Someone Frank Terry? No  Are You Planning to Harm Someone at This Time? No  Explanation: No data recorded  Have You Used Any Alcohol or Drugs in the Past 24 Hours? Yes  How Long Ago Did You Use Drugs or Alcohol? No data recorded What Did You Use and How Much? Alcohol and cannabis   Do You  Currently Have a Therapist/Psychiatrist? No  Name of Therapist/Psychiatrist: No data recorded  Have You Been Recently Discharged From Any Office Practice or Programs? No  Explanation of Discharge From Practice/Program: No data recorded   CCA Screening Triage Referral Assessment Type of Contact: Face-to-Face  Telemedicine Service Delivery:   Is this Initial or Reassessment?   Date Telepsych consult ordered in [facility]:    Time Telepsych consult ordered in [facility]:    Location of Assessment: [facility] ED  Provider Location: [facility] ED    Collateral Involvement: No data recorded  Does Patient Have a Court  Appointed Legal Guardian? No data recorded Name and Contact of Legal Guardian: No data recorded If Minor and Not Living with Parent(s), Who has Custody? No data recorded Is CPS involved or ever been involved? Never  Is APS involved or ever been involved? Never   Patient Determined To Be At Risk for Harm To Self or Others Based on Review of Patient Reported Information or Presenting Complaint? No  Method: No data recorded Availability of Means: No data recorded Intent: No data recorded Notification Required: No data recorded Additional Information for Danger to Others Potential: No data recorded Additional Comments for Danger to Others Potential: No data recorded Are There Guns or Other Weapons in Your Home? No  Types of Guns/Weapons: No data recorded Are These Weapons Safely Secured?                            No data recorded Who Could Verify You Are Able To Have These Secured: No data recorded Do You Have any Outstanding Charges, Pending Court Dates, Parole/Probation? No data recorded Contacted To Inform of Risk of Harm To Self or Others: No data recorded  Does Patient Present under Involuntary Commitment? No   [place] of Residence: [place]   Patient Currently Receiving the Following Services: Not Receiving Services   Determination of Need: Emergent (2 hours)   Options For  Referral: ED Visit; Other: Comment   Discharge Disposition:     Lilyan Gilford MS, LCAS, [facility], [facility] Therapeutic Triage Specialist 03/02/2023 11:16 AM

## 2023-03-02 NOTE — Assessment & Plan Note (Signed)
Na 126-128  Clinically dry  Gentle IVF  Serial Na  Check urine and serum studies  Goal 6-8 mEq change over 12-24 hours  Follow

## 2023-03-02 NOTE — ED Notes (Signed)
VOL PENDING  MEDICAL  ADMIT

## 2023-03-02 NOTE — Consult Note (Signed)
[facility] Face-to-Face Psychiatry Consult   Reason for Consult:  alcohol detox request Referring Physician:  EDP Patient Identification: Frank Terry MRN:  [id number] Principal Diagnosis: Alcohol abuse with alcohol-induced mood disorder Diagnosis:  Principal Problem:   Alcohol abuse with alcohol-induced mood disorder Active Problems:   Alcohol withdrawal   Total Time spent with patient: 45 minutes  Subjective:   Frank Terry is a 57 y.o. male patient admitted with alcohol detox request.  HPI:  57 yo male presented for alcohol detox, drinking over a case of beer daily for the past 3-4 months.  Depression is  "Not good, my goal is to live", denies suicidal ideations.  He is motivated to go to detox/rehab to get the assistance he needs.  One previous rehab at [facility] in 1997.  Denies withdrawals of seizures.  Anxiety is high, no panic attacks and no medications.  Sleep is "poor", often self medicates with alcohol to sleep.  Appetite is fair.  Psych clear for detox/rehab.  Past Psychiatric History: depression, anxiety, alcohol use d/o  Risk to Self:  none Risk to Others:  none Prior Inpatient Therapy:  [facility] Prior Outpatient Therapy:  none  Past Medical History:  Past Medical History:  Diagnosis Date   Asthma    Gout    Osteoporosis    Prostate cancer    History reviewed. No pertinent surgical history. Family History: History reviewed. No pertinent family history. Family Psychiatric  History: none Social History:  Social History   Substance and Sexual Activity  Alcohol Use Not Currently   Alcohol/week: 168.0 standard drinks of alcohol   Types: 168 Cans of beer per week     Social History   Substance and Sexual Activity  Drug Use Yes   Types: Marijuana    Social History   Socioeconomic History   Marital status: Divorced    Spouse name: Not on file   Number of children: Not on file   Years of education: Not on file   Highest education level: Not on file  Occupational History    Not on file  Tobacco Use   Smoking status: Every Day    Types: Cigarettes   Smokeless tobacco: Never  Vaping Use   Vaping Use: Never used  Substance and Sexual Activity   Alcohol use: Not Currently    Alcohol/week: 168.0 standard drinks of alcohol    Types: 168 Cans of beer per week   Drug use: Yes    Types: Marijuana   Sexual activity: Not on file  Other Topics Concern   Not on file  Social History Narrative   Not on file   Social Determinants of Health   Financial Resource Strain: Not on file  Food Insecurity: Not on file  Transportation Needs: Not on file  Physical Activity: Not on file  Stress: Not on file  Social Connections: Not on file   Additional Social History:    Allergies:   Allergies  Allergen Reactions   Penicillins     Labs:  Results for orders placed or performed during the hospital encounter of 03/02/23 (from the past 48 hour(s))  Comprehensive metabolic panel     Status: Abnormal   Collection Time: 03/02/23  9:08 AM  Result Value Ref Range   Sodium 126 (L) 135 - 145 mmol/L   Potassium 3.8 3.5 - 5.1 mmol/L   Chloride 90 (L) 98 - 111 mmol/L   CO2 22 22 - 32 mmol/L   Glucose, Bld 116 (H) 70 -  99 mg/dL    Comment: Glucose reference range applies only to samples taken after fasting for at least 8 hours.   BUN 6 6 - 20 mg/dL   Creatinine, Ser 2.95 0.61 - 1.24 mg/dL   Calcium 9.7 8.9 - 62.1 mg/dL   Total Protein 8.1 6.5 - 8.1 g/dL   Albumin 4.8 3.5 - 5.0 g/dL   AST 41 15 - 41 U/L   ALT 21 0 - 44 U/L   Alkaline Phosphatase 57 38 - 126 U/L   Total Bilirubin 1.5 (H) 0.3 - 1.2 mg/dL   GFR, Estimated >30 >86 mL/min    Comment: (NOTE) Calculated using the CKD-EPI Creatinine Equation (2021)    Anion gap 14 5 - 15    Comment: Performed at [facility], [address] [place], [place] [address]  Ethanol     Status: None   Collection Time: 03/02/23  9:08 AM  Result Value Ref Range   Alcohol, Ethyl (B) <10 <10 mg/dL    Comment:  (NOTE) Lowest detectable limit for serum alcohol is 10 mg/dL.  For medical purposes only. Performed at [facility], [address] [place], [place] [address]   cbc     Status: None   Collection Time: 03/02/23  9:08 AM  Result Value Ref Range   WBC 9.3 4.0 - 10.5 K/uL   RBC 4.84 4.22 - 5.81 MIL/uL   Hemoglobin 16.0 13.0 - 17.0 g/dL   HCT 28.4 13.2 - 44.0 %   MCV 93.4 80.0 - 100.0 fL   MCH 33.1 26.0 - 34.0 pg   MCHC 35.4 30.0 - 36.0 g/dL   RDW 10.2 72.5 - 36.6 %   Platelets 219 150 - 400 K/uL   nRBC 0.0 0.0 - 0.2 %    Comment: Performed at [facility], [address], [place], [place] [address]  Urine Drug Screen, Qualitative     Status: Abnormal   Collection Time: 03/02/23  9:08 AM  Result Value Ref Range   Tricyclic, Ur Screen NONE DETECTED NONE DETECTED   Amphetamines, Ur Screen NONE DETECTED NONE DETECTED   MDMA (Ecstasy)Ur Screen NONE DETECTED NONE DETECTED   Cocaine Metabolite,Ur [place] NONE DETECTED NONE DETECTED   Opiate, Ur Screen NONE DETECTED NONE DETECTED   Phencyclidine (PCP) Ur S NONE DETECTED NONE DETECTED   Cannabinoid 50 Ng, Ur [place] POSITIVE (A) NONE DETECTED   Barbiturates, Ur Screen NONE DETECTED NONE DETECTED   Benzodiazepine, Ur Scrn NONE DETECTED NONE DETECTED   Methadone Scn, Ur NONE DETECTED NONE DETECTED    Comment: (NOTE) Tricyclics + metabolites, urine    Cutoff 1000 ng/mL Amphetamines + metabolites, urine  Cutoff 1000 ng/mL MDMA (Ecstasy), urine              Cutoff 500 ng/mL Cocaine Metabolite, urine          Cutoff 300 ng/mL Opiate + metabolites, urine        Cutoff 300 ng/mL Phencyclidine (PCP), urine         Cutoff 25 ng/mL Cannabinoid, urine                 Cutoff 50 ng/mL Barbiturates + metabolites, urine  Cutoff 200 ng/mL Benzodiazepine, urine              Cutoff 200 ng/mL Methadone, urine                   Cutoff 300 ng/mL  The urine drug screen provides only  a preliminary, unconfirmed analytical test result and should  not be used for non-medical purposes. Clinical consideration and professional judgment should be applied to any positive drug screen result due to possible interfering substances. A more specific alternate chemical method must be used in order to obtain a confirmed analytical result. Gas chromatography / mass spectrometry (GC/MS) is the preferred confirm atory method. Performed at [facility], [address] [place], [place] [address]     Current Facility-Administered Medications  Medication Dose Route Frequency Provider Last Rate Last Admin   folic acid (FOLVITE) tablet 1 mg  1 mg Oral Daily Sharyn Creamer, MD   1 mg at 03/02/23 1057   LORazepam (ATIVAN) tablet 1-4 mg  1-4 mg Oral Q1H PRN Sharyn Creamer, MD   2 mg at 03/02/23 1057   Or   LORazepam (ATIVAN) injection 1-4 mg  1-4 mg Intravenous Q1H PRN Sharyn Creamer, MD       multivitamin with minerals tablet 1 tablet  1 tablet Oral Daily Sharyn Creamer, MD   1 tablet at 03/02/23 1057   thiamine (VITAMIN B1) tablet 100 mg  100 mg Oral Daily Sharyn Creamer, MD   100 mg at 03/02/23 1057   Or   thiamine (VITAMIN B1) injection 100 mg  100 mg Intravenous Daily Sharyn Creamer, MD       Current Outpatient Medications  Medication Sig Dispense Refill   albuterol (VENTOLIN HFA) 108 (90 Base) MCG/ACT inhaler Inhale 2 puffs into the lungs every 6 (six) hours as needed for wheezing.     colchicine 0.6 MG tablet Take 1.2 mg by mouth daily.     nicotine (NICODERM CQ - DOSED IN MG/24 HOURS) 21 mg/24hr patch Place 1 patch (21 mg total) onto the skin daily. (Patient not taking: Reported on 03/02/2023) 90 patch 3   nicotine polacrilex (NICOTINE MINI) 4 MG lozenge Take 1 lozenge (4 mg total) by mouth as needed for smoking cessation. (Patient not taking: Reported on 03/02/2023) 100 tablet 0   polyethylene glycol powder (GNP CLEARLAX) 17 GM/SCOOP powder Take 17 g by mouth daily. (Patient not taking: Reported on 03/02/2023) 238 g 0    Musculoskeletal: Strength &  Muscle Tone: within normal limits Gait & Station: normal Patient leans: N/A  Psychiatric Specialty Exam: Physical Exam Vitals and nursing note reviewed.  Constitutional:      Appearance: Normal appearance.  HENT:     Head: Normocephalic.     Nose: Nose normal.  Pulmonary:     Effort: Pulmonary effort is normal.  Musculoskeletal:        General: Normal range of motion.     Cervical back: Normal range of motion.  Neurological:     General: No focal deficit present.     Mental Status: He is alert and oriented to person, place, and time.  Psychiatric:        Attention and Perception: Attention and perception normal.        Mood and Affect: Mood is anxious and depressed.        Speech: Speech normal.        Behavior: Behavior normal. Behavior is cooperative.        Thought Content: Thought content normal.        Cognition and Memory: Cognition and memory normal.        Judgment: Judgment normal.     Review of Systems  Psychiatric/Behavioral:  Positive for depression and substance abuse. The patient is nervous/anxious.   All other systems  reviewed and are negative.   Blood pressure (!) 186/109, pulse 94, temperature 97.9 F (36.6 C), temperature source Oral, resp. rate 18, height 5\' 8"  (1.727 m), weight 79.4 kg, SpO2 97 %.Body mass index is 26.61 kg/m.  General Appearance: Casual  Eye Contact:  Good  Speech:  Normal Rate  Volume:  Normal  Mood:  Anxious and Depressed  Affect:  Congruent  Thought Process:  Coherent  Orientation:  Full (Time, Place, and Person)  Thought Content:  WDL and Logical  Suicidal Thoughts:  No  Homicidal Thoughts:  No  Memory:  Immediate;   Good Recent;   Good Remote;   Good  Judgement:  Fair  Insight:  Fair  Psychomotor Activity:  Normal  Concentration:  Concentration: Fair and Attention Span: Fair  Recall:  [employer] of Knowledge:  Fair  Language:  Good  Akathisia:  No  Handed:  Right  AIMS (if indicated):     Assets:  Housing Leisure  Time Physical Health Resilience  ADL's:  Intact  Cognition:  WNL  Sleep:        Physical Exam: Physical Exam Vitals and nursing note reviewed.  Constitutional:      Appearance: Normal appearance.  HENT:     Head: Normocephalic.     Nose: Nose normal.  Pulmonary:     Effort: Pulmonary effort is normal.  Musculoskeletal:        General: Normal range of motion.     Cervical back: Normal range of motion.  Neurological:     General: No focal deficit present.     Mental Status: He is alert and oriented to person, place, and time.  Psychiatric:        Attention and Perception: Attention and perception normal.        Mood and Affect: Mood is anxious and depressed.        Speech: Speech normal.        Behavior: Behavior normal. Behavior is cooperative.        Thought Content: Thought content normal.        Cognition and Memory: Cognition and memory normal.        Judgment: Judgment normal.    Review of Systems  Psychiatric/Behavioral:  Positive for depression and substance abuse. The patient is nervous/anxious.   All other systems reviewed and are negative.  Blood pressure (!) 186/109, pulse 94, temperature 97.9 F (36.6 C), temperature source Oral, resp. rate 18, height 5\' 8"  (1.727 m), weight 79.4 kg, SpO2 97 %. Body mass index is 26.61 kg/m.  Treatment Plan Summary: Alcohol detox/rehab: Ativan detox protocol started [facility] or [facility]  Disposition: No evidence of imminent risk to self or others at present.   Patient does not meet criteria for psychiatric inpatient admission. Supportive therapy provided about ongoing stressors.  Nanine Means, NP 03/02/2023 11:56 AM

## 2023-03-02 NOTE — Assessment & Plan Note (Signed)
Initial complaints of SI  w/ ? Slittting throat  Formally evaluated by [facility]  Ruled out from a psychiatric standpoint

## 2023-03-02 NOTE — Assessment & Plan Note (Signed)
Not currently being treated/managed per patient

## 2023-03-02 NOTE — Assessment & Plan Note (Signed)
Regular 24+ beer w/ intermittent liquor use  Zero alcohol x 24+ hours  + generalized tremors, palpitations  Start withdrawal protocol  Prn IV ativan  Thiamine, folate, multivitamin  Follow

## 2023-03-02 NOTE — ED Provider Notes (Signed)
[facility] Provider Note    Event Date/Time   First MD Initiated Contact with Patient 03/02/23 [phone]     (approximate)   History   Mental Health Problem and Alcohol Problem   HPI  Frank Terry is a 57 y.o. male reports he is here because he is has alcohol withdrawal.  He also reports at 1 point he felt like he wanted to get rid of all the knives in his house because he could be at risk of hurting himself when he was starting to go through withdrawal.  He reports he entered withdrawal because he did not have the money to buy his normal alcohol which is about 24 cans of beer daily  He last drank a little over a day ago.  He is experiencing a lot of shakes and anxiety feels like he needs to pace about.  Denies confusion or hallucinations though  He denies that he is explicitly suicidal, rather reports that he felt like he might become suicidal, but now realizes what he really needs his alcohol treatment and possibly rehab  Denies desire to harm himself now     Physical Exam   Triage Vital Signs: ED Triage Vitals  Enc Vitals Group     BP 03/02/23 0857 (!) 186/109     Pulse Rate 03/02/23 0857 94     Resp 03/02/23 0857 18     Temp 03/02/23 0857 97.9 F (36.6 C)     Temp Source 03/02/23 0857 Oral     SpO2 03/02/23 0857 97 %     Weight 03/02/23 0901 175 lb (79.4 kg)     Height 03/02/23 0901 5\' 8"  (1.727 m)     Head Circumference --      Peak Flow --      Pain Score 03/02/23 0901 7     Pain Loc --      Pain Edu? --      Excl. in GC? --     Most recent vital signs: Vitals:   03/02/23 1157 03/02/23 1202  BP: (!) 167/98 (!) 167/98  Pulse: 79 79  Resp:  17  Temp:  98 F (36.7 C)  SpO2:  99%     General: Awake, no distress.  Appears somewhat poorly kempt. CV:  Good peripheral perfusion. Resp:  Normal effort.  Abd:  No distention.  Other:  Moderate high-frequency upper extremity tremor in both hands.  Speech is clear.  Speech content appears  clear he does not appear to have any hallucinations.  He participates in examination.  He does seem somewhat anxious.  Slightly diaphoretic  Withdrawal score of 13   ED Results / Procedures / Treatments   Labs (all labs ordered are listed, but only abnormal results are displayed) Labs Reviewed  COMPREHENSIVE METABOLIC PANEL - Abnormal; Notable for the following components:      Result Value   Sodium 126 (*)    Chloride 90 (*)    Glucose, Bld 116 (*)    Total Bilirubin 1.5 (*)    All other components within normal limits  URINE DRUG SCREEN, QUALITATIVE ([facility] ONLY) - Abnormal; Notable for the following components:   Cannabinoid 50 Ng, Ur [place] POSITIVE (*)    All other components within normal limits  SODIUM - Abnormal; Notable for the following components:   Sodium 125 (*)    All other components within normal limits  ETHANOL  CBC  HIV ANTIBODY (ROUTINE TESTING W REFLEX)  SODIUM  SODIUM     EKG     RADIOLOGY     PROCEDURES:  Critical Care performed: No  Procedures   MEDICATIONS ORDERED IN ED: Medications  LORazepam (ATIVAN) tablet 1-4 mg (2 mg Oral Given 03/02/23 1057)    Or  LORazepam (ATIVAN) injection 1-4 mg ( Intravenous See Alternative 03/02/23 1057)  thiamine (VITAMIN B1) tablet 100 mg (100 mg Oral Given 03/02/23 1057)    Or  thiamine (VITAMIN B1) injection 100 mg ( Intravenous See Alternative 03/02/23 1057)  folic acid (FOLVITE) tablet 1 mg (1 mg Oral Given 03/02/23 1057)  multivitamin with minerals tablet 1 tablet (1 tablet Oral Given 03/02/23 1057)  enoxaparin (LOVENOX) injection 40 mg (has no administration in time range)  0.9 %  sodium chloride infusion ( Intravenous Other (enter comment in med admin window) 03/02/23 1235)  ondansetron (ZOFRAN) tablet 4 mg (has no administration in time range)    Or  ondansetron (ZOFRAN) injection 4 mg (has no administration in time range)     IMPRESSION / MDM / ASSESSMENT AND PLAN / ED COURSE  I reviewed the triage  vital signs and the nursing notes.                              Differential diagnosis includes, but is not limited to, acute alcohol withdrawal, at risk for delirium tremens, electrolyte abnormalities, etc.  He was seen and evaluated by Nanine Means our psychiatry nurse practitioner, she advises does not appear to need acute psychiatric treatment or involuntary commitment.  Recommends patient continue down the road of alcohol treatment, and the patient is agreeable with this.  He is voluntary at this time and seems quite motivated to enter rehab, he denies any ongoing suicidal ideations  ----------------------------------------- 11:53 AM on 03/02/2023 ----------------------------------------- Labs quite remarkable for a sodium of 126.  He does have a history of mild hyponatremia but this seems to represent a significant drop.  Suspect given his history this is likely beer [place] mania, but further workup would be required.  His CIWA score is 13 but after receiving lorazepam he is starting to feel much better.  He is resting comfortably.  I have consulted with our hospitalist Dr. Alvester Morin to evaluate the patient in the setting of acute alcohol withdrawal, with concern for notable hyponatremia which I suspect may require inpatient monitoring.  Will discuss case and consultation recommendations with Dr. Lonzo Cloud upon completion  Patient's presentation is most consistent with acute complicated illness / injury requiring diagnostic workup.   Patient admitted to the hospitalist service for alcohol withdrawal and notable hyponatremia.  Patient agreeable understanding of plan for admission       FINAL CLINICAL IMPRESSION(S) / ED DIAGNOSES   Final diagnoses:  Hyponatremia  Alcohol withdrawal syndrome without complication     Rx / DC Orders   ED Discharge Orders     None        Note:  This document was prepared using Dragon voice recognition software and may include unintentional dictation  errors.   Sharyn Creamer, MD 03/02/23 1313

## 2023-03-02 NOTE — Assessment & Plan Note (Signed)
Stable from a resp standpoint  Prn albuterol

## 2023-03-02 NOTE — ED Notes (Signed)
Admitting doctor at the bedside 

## 2023-03-03 ENCOUNTER — Encounter: Payer: Self-pay | Admitting: Family Medicine

## 2023-03-03 ENCOUNTER — Inpatient Hospital Stay: Payer: Medicare HMO

## 2023-03-03 DIAGNOSIS — R079 Chest pain, unspecified: Secondary | ICD-10-CM | POA: Diagnosis not present

## 2023-03-03 DIAGNOSIS — I160 Hypertensive urgency: Secondary | ICD-10-CM | POA: Diagnosis not present

## 2023-03-03 DIAGNOSIS — I10 Essential (primary) hypertension: Secondary | ICD-10-CM | POA: Diagnosis present

## 2023-03-03 DIAGNOSIS — E871 Hypo-osmolality and hyponatremia: Secondary | ICD-10-CM | POA: Diagnosis not present

## 2023-03-03 DIAGNOSIS — F1014 Alcohol abuse with alcohol-induced mood disorder: Secondary | ICD-10-CM | POA: Diagnosis not present

## 2023-03-03 DIAGNOSIS — K259 Gastric ulcer, unspecified as acute or chronic, without hemorrhage or perforation: Secondary | ICD-10-CM | POA: Clinically undetermined

## 2023-03-03 LAB — COMPREHENSIVE METABOLIC PANEL WITH GFR
ALT: 18 U/L (ref 0–44)
ALT: 20 U/L (ref 0–44)
AST: 30 U/L (ref 15–41)
AST: 37 U/L (ref 15–41)
Albumin: 4.1 g/dL (ref 3.5–5.0)
Albumin: 4.4 g/dL (ref 3.5–5.0)
Alkaline Phosphatase: 50 U/L (ref 38–126)
Alkaline Phosphatase: 54 U/L (ref 38–126)
Anion gap: 10 (ref 5–15)
Anion gap: 11 (ref 5–15)
BUN: 8 mg/dL (ref 6–20)
BUN: 10 mg/dL (ref 6–20)
CO2: 23 mmol/L (ref 22–32)
CO2: 19 mmol/L — ABNORMAL LOW (ref 22–32)
Calcium: 9.1 mg/dL (ref 8.9–10.3)
Calcium: 9 mg/dL (ref 8.9–10.3)
Chloride: 96 mmol/L — ABNORMAL LOW (ref 98–111)
Chloride: 94 mmol/L — ABNORMAL LOW (ref 98–111)
Creatinine, Ser: 0.91 mg/dL (ref 0.61–1.24)
Creatinine, Ser: 0.98 mg/dL (ref 0.61–1.24)
GFR, Estimated: >60 mL/min (ref >60)
GFR, Estimated: >60 mL/min (ref >60)
Glucose, Bld: 96 mg/dL (ref 70–99)
Glucose, Bld: 116 mg/dL — ABNORMAL HIGH (ref 70–99)
Potassium: 3.5 mmol/L (ref 3.5–5.1)
Potassium: 3.4 mmol/L — ABNORMAL LOW (ref 3.5–5.1)
Sodium: 129 mmol/L — ABNORMAL LOW (ref 135–145)
Sodium: 124 mmol/L — ABNORMAL LOW (ref 135–145)
Total Bilirubin: 2.3 mg/dL — ABNORMAL HIGH (ref 0.3–1.2)
Total Bilirubin: 2.6 mg/dL — ABNORMAL HIGH (ref 0.3–1.2)
Total Protein: 7.2 g/dL (ref 6.5–8.1)
Total Protein: 7.8 g/dL (ref 6.5–8.1)

## 2023-03-03 LAB — FERRITIN: Ferritin: 110 ng/mL (ref 24–336)

## 2023-03-03 LAB — CBC
HCT: 44.6 % (ref 39.0–52.0)
HCT: 46.6 % (ref 39.0–52.0)
Hemoglobin: 15.7 g/dL (ref 13.0–17.0)
Hemoglobin: 16.6 g/dL (ref 13.0–17.0)
MCH: 33.3 pg (ref 26.0–34.0)
MCH: 33.9 pg (ref 26.0–34.0)
MCHC: 35.2 g/dL (ref 30.0–36.0)
MCHC: 35.6 g/dL (ref 30.0–36.0)
MCV: 94.5 fL (ref 80.0–100.0)
MCV: 95.1 fL (ref 80.0–100.0)
Platelets: 170 10*3/uL (ref 150–400)
Platelets: 182 K/uL (ref 150–400)
RBC: 4.72 MIL/uL (ref 4.22–5.81)
RBC: 4.9 MIL/uL (ref 4.22–5.81)
RDW: 12.8 % (ref 11.5–15.5)
RDW: 12.8 % (ref 11.5–15.5)
WBC: 4.2 10*3/uL (ref 4.0–10.5)
WBC: 6 K/uL (ref 4.0–10.5)
nRBC: 0 % (ref 0.0–0.2)
nRBC: 0 % (ref 0.0–0.2)

## 2023-03-03 LAB — MAGNESIUM: Magnesium: 1.9 mg/dL (ref 1.7–2.4)

## 2023-03-03 LAB — SODIUM: Sodium: 125 mmol/L — ABNORMAL LOW (ref 135–145)

## 2023-03-03 LAB — IRON AND TIBC
Iron: 259 ug/dL — ABNORMAL HIGH (ref 45–182)
Saturation Ratios: 61 % — ABNORMAL HIGH (ref 17.9–39.5)
TIBC: 424 ug/dL (ref 250–450)
UIBC: 165 ug/dL

## 2023-03-03 LAB — BRAIN NATRIURETIC PEPTIDE: B Natriuretic Peptide: 42.5 pg/mL (ref 0.0–100.0)

## 2023-03-03 LAB — LIPASE, BLOOD: Lipase: 28 U/L (ref 11–51)

## 2023-03-03 LAB — ETHANOL: Alcohol, Ethyl (B): <10 mg/dL (ref <10)

## 2023-03-03 LAB — VITAMIN B12: Vitamin B-12: 163 pg/mL — ABNORMAL LOW (ref 180–914)

## 2023-03-03 LAB — D-DIMER, QUANTITATIVE: D-Dimer, Quant: 0.32 ug/mL-FEU (ref 0.00–0.50)

## 2023-03-03 LAB — TROPONIN I (HIGH SENSITIVITY): Troponin I (High Sensitivity): 4 ng/L (ref <18)

## 2023-03-03 MED ORDER — IBUPROFEN 400 MG PO TABS
600.0000 mg | ORAL_TABLET | Freq: Four times a day (QID) | ORAL | Status: DC | PRN
  Administered 2023-03-03: 600 mg via ORAL
  Filled 2023-03-03: qty 2

## 2023-03-03 MED ORDER — SODIUM CHLORIDE 0.9 % IV SOLN: INTRAVENOUS | Status: DC

## 2023-03-03 MED ORDER — CHLORDIAZEPOXIDE HCL 25 MG PO CAPS
25.0000 mg | ORAL_CAPSULE | Freq: Four times a day (QID) | ORAL | Status: AC
  Administered 2023-03-03 – 2023-03-04 (×7): 25 mg via ORAL
  Filled 2023-03-03 (×7): qty 1

## 2023-03-03 MED ORDER — LISINOPRIL 10 MG PO TABS: 10.0000 mg | ORAL_TABLET | Freq: Every day | ORAL | Status: DC

## 2023-03-03 MED ORDER — BUDESONIDE 0.25 MG/2ML IN SUSP
0.2500 mg | Freq: Two times a day (BID) | RESPIRATORY_TRACT | Status: DC
  Administered 2023-03-03 – 2023-03-05 (×3): 0.25 mg via RESPIRATORY_TRACT
  Filled 2023-03-03 (×3): qty 2

## 2023-03-03 MED ORDER — IPRATROPIUM-ALBUTEROL 0.5-2.5 (3) MG/3ML IN SOLN
3.0000 mL | Freq: Four times a day (QID) | RESPIRATORY_TRACT | Status: AC
  Administered 2023-03-03 – 2023-03-04 (×2): 3 mL via RESPIRATORY_TRACT
  Filled 2023-03-03 (×5): qty 3

## 2023-03-03 MED ORDER — IRBESARTAN 150 MG PO TABS
150.0000 mg | ORAL_TABLET | Freq: Every day | ORAL | Status: DC
  Administered 2023-03-03 – 2023-03-05 (×3): 150 mg via ORAL
  Filled 2023-03-03 (×3): qty 1

## 2023-03-03 MED ORDER — POTASSIUM CHLORIDE CRYS ER 20 MEQ PO TBCR
40.0000 meq | EXTENDED_RELEASE_TABLET | Freq: Once | ORAL | Status: AC
  Administered 2023-03-03: 40 meq via ORAL
  Filled 2023-03-03: qty 2

## 2023-03-03 MED ORDER — LABETALOL HCL 5 MG/ML IV SOLN
20.0000 mg | INTRAVENOUS | Status: DC | PRN
  Administered 2023-03-03: 20 mg via INTRAVENOUS
  Filled 2023-03-03: qty 4

## 2023-03-03 MED ORDER — CYANOCOBALAMIN 1000 MCG/ML IJ SOLN
1000.0000 ug | Freq: Once | INTRAMUSCULAR | Status: DC
  Filled 2023-03-03: qty 1

## 2023-03-03 MED ORDER — PANTOPRAZOLE 80MG IVPB - SIMPLE MED
80.0000 mg | Freq: Once | INTRAVENOUS | Status: AC
  Administered 2023-03-03: 80 mg via INTRAVENOUS
  Filled 2023-03-03: qty 100

## 2023-03-03 MED ORDER — IPRATROPIUM-ALBUTEROL 0.5-2.5 (3) MG/3ML IN SOLN
3.0000 mL | RESPIRATORY_TRACT | Status: DC | PRN
  Administered 2023-03-03: 3 mL via RESPIRATORY_TRACT
  Filled 2023-03-03: qty 3

## 2023-03-03 MED ORDER — HYDRALAZINE HCL 50 MG PO TABS
25.0000 mg | ORAL_TABLET | Freq: Three times a day (TID) | ORAL | Status: DC
  Administered 2023-03-03 – 2023-03-04 (×2): 25 mg via ORAL
  Filled 2023-03-03 (×2): qty 1

## 2023-03-03 MED ORDER — AMLODIPINE BESYLATE 5 MG PO TABS: 5.0000 mg | ORAL_TABLET | Freq: Every day | ORAL | Status: DC

## 2023-03-03 NOTE — Significant Event (Signed)
PROGRESS NOTE    Frank Terry  TRR:[id number] DOB: 30-Sep-1966 DOA: 03/02/2023  PCP: Pcp, No    Brief Narrative:  RRT called for chest pain. Chest pain midsternal and left sided. Tender to touch. 9/10 worse with deep breathing.  NR. Tender / reproducible.   Assessment & Plan: Principal Problem:   Alcohol abuse with alcohol-induced mood disorder Active Problems:   Chest pain   Gastric ulcer   ETOH abuse   Alcohol withdrawal   Hyponatremia   Asthma, chronic   Gout   Prostate cancer   Hypertensive urgency  >>Chest pain: Suspect GI related EKG is SR with Prominent biatrial enlargement :  suspect 2/2 to etoh abuse.  Pt has had an echo in Jan 2012 showing: Summary:  1. The study quality is technically difficult.  2. The EF is estimated at 55-60%.  3. Normal left ventricular diastolic filling is observed.  4. There is a trivial amount of mitral regurgitation.  Suspect 2/2 to COPD exacerbation. We will start pt on duonebs and obtain chest xray.  We will also get TNI/Dimer. We will also get lipase and CMP. We will also give Protonix 80 mg as pt has h/o gastric ulcers and Bleeding ulcer with use of goody and bc powder and d/c ibuprofen and nsaids of any kind.  Pt's last EGD was on 01/31/2016 : multiple linear gastric ulcers  in cardia and fundus.  Pt's last colonoscopy was in 01/31/2016 and was normal except for diverticulosis.  We will follow blood work result and cont workup. Cardiology consult or echo per AM team.  Dimer negative.  TNI negative. Bnp 42.5 Chest xray negative.  We will cont duonebs and start aggressive PPI therapy for h/o gastric ulcers.  We will start pulmicort INH and avoid po steroids for mild COPD exacerbation.   Objective: Vitals:   03/03/23 1631 03/03/23 1822 03/03/23 1827 03/03/23 1846  BP: (!) 175/130 (!) 182/119 (!) 184/124 (!) 181/121  Pulse: (!) 102 (!) 109 (!) 110 (!) 105  Resp: 18 18 18    Temp: 98 F (36.7 C) 98.2 F (36.8 C) 97.9 F  (36.6 C)   TempSrc: Oral Oral Oral   SpO2: 99% 100% 98% 100%  Weight:      Height:       SpO2: 100 %  Intake/Output Summary (Last 24 hours) at 03/03/2023 1951 Last data filed at 03/03/2023 1526 Gross per 24 hour  Intake 1319.39 ml  Output 1125 ml  Net 194.39 ml   Filed Weights   03/02/23 0901 03/02/23 1920  Weight: 79.4 kg 71.4 kg    Examination: Blood pressure (!) 181/121, pulse (!) 105, temperature 97.9 F (36.6 C), temperature source Oral, resp. rate 18, height 5\' 9"  (1.753 m), weight 71.4 kg, SpO2 100 %. Physical Exam Vitals reviewed.  Constitutional:      General: He is not in acute distress. Eyes:     Extraocular Movements: Extraocular movements intact.  Cardiovascular:     Rate and Rhythm: Normal rate and regular rhythm.     Pulses:          Dorsalis pedis pulses are 2+ on the right side and 2+ on the left side.       Posterior tibial pulses are 2+ on the right side and 2+ on the left side.     Heart sounds: Normal heart sounds.  Pulmonary:     Effort: Pulmonary effort is normal. No accessory muscle usage or respiratory distress.     Breath  sounds: Examination of the right-upper field reveals wheezing. Examination of the left-upper field reveals wheezing. Examination of the right-middle field reveals wheezing. Examination of the left-middle field reveals wheezing. Examination of the right-lower field reveals wheezing. Examination of the left-lower field reveals wheezing. Wheezing present.  Chest:     Chest wall: No mass, lacerations, deformity, swelling, tenderness, crepitus or edema. There is no dullness to percussion.    Musculoskeletal:     Right lower leg: No edema.     Left lower leg: No edema.  Neurological:     Mental Status: He is alert.    Unresulted Labs (From admission, onward)    None      Results for orders placed or performed during the hospital encounter of 03/02/23 (from the past 24 hour(s))  Sodium     Status: Abnormal   Collection Time:  03/03/23 12:18 AM  Result Value Ref Range   Sodium 125 (L) 135 - 145 mmol/L  Iron and TIBC     Status: Abnormal   Collection Time: 03/03/23  5:51 AM  Result Value Ref Range   Iron 259 (H) 45 - 182 ug/dL   TIBC 161 096 - 045 ug/dL   Saturation Ratios 61 (H) 17.9 - 39.5 %   UIBC 165 ug/dL  Ferritin     Status: None   Collection Time: 03/03/23  5:51 AM  Result Value Ref Range   Ferritin 110 24 - 336 ng/mL  CBC     Status: None   Collection Time: 03/03/23  5:54 AM  Result Value Ref Range   WBC 4.2 4.0 - 10.5 K/uL   RBC 4.72 4.22 - 5.81 MIL/uL   Hemoglobin 15.7 13.0 - 17.0 g/dL   HCT 40.9 81.1 - 91.4 %   MCV 94.5 80.0 - 100.0 fL   MCH 33.3 26.0 - 34.0 pg   MCHC 35.2 30.0 - 36.0 g/dL   RDW 78.2 95.6 - 21.3 %   Platelets 170 150 - 400 K/uL   nRBC 0.0 0.0 - 0.2 %  Comprehensive metabolic panel     Status: Abnormal   Collection Time: 03/03/23  5:54 AM  Result Value Ref Range   Sodium 129 (L) 135 - 145 mmol/L   Potassium 3.5 3.5 - 5.1 mmol/L   Chloride 96 (L) 98 - 111 mmol/L   CO2 23 22 - 32 mmol/L   Glucose, Bld 96 70 - 99 mg/dL   BUN 8 6 - 20 mg/dL   Creatinine, Ser 0.86 0.61 - 1.24 mg/dL   Calcium 9.1 8.9 - 57.8 mg/dL   Total Protein 7.2 6.5 - 8.1 g/dL   Albumin 4.1 3.5 - 5.0 g/dL   AST 30 15 - 41 U/L   ALT 18 0 - 44 U/L   Alkaline Phosphatase 50 38 - 126 U/L   Total Bilirubin 2.3 (H) 0.3 - 1.2 mg/dL   GFR, Estimated >46 >96 mL/min   Anion gap 10 5 - 15  Lipase, blood     Status: None   Collection Time: 03/03/23  6:41 PM  Result Value Ref Range   Lipase 28 11 - 51 U/L  Magnesium     Status: None   Collection Time: 03/03/23  6:41 PM  Result Value Ref Range   Magnesium 1.9 1.7 - 2.4 mg/dL  Brain natriuretic peptide     Status: None   Collection Time: 03/03/23  6:41 PM  Result Value Ref Range   B Natriuretic Peptide 42.5 0.0 - 100.0 pg/mL  Troponin I (High Sensitivity)     Status: None   Collection Time: 03/03/23  6:41 PM  Result Value Ref Range   Troponin I (High  Sensitivity) 4 <18 ng/L  Ethanol     Status: None   Collection Time: 03/03/23  6:41 PM  Result Value Ref Range   Alcohol, Ethyl (B) <10 <10 mg/dL  Comprehensive metabolic panel     Status: Abnormal   Collection Time: 03/03/23  6:41 PM  Result Value Ref Range   Sodium 124 (L) 135 - 145 mmol/L   Potassium 3.4 (L) 3.5 - 5.1 mmol/L   Chloride 94 (L) 98 - 111 mmol/L   CO2 19 (L) 22 - 32 mmol/L   Glucose, Bld 116 (H) 70 - 99 mg/dL   BUN 10 6 - 20 mg/dL   Creatinine, Ser 8.65 0.61 - 1.24 mg/dL   Calcium 9.0 8.9 - 78.4 mg/dL   Total Protein 7.8 6.5 - 8.1 g/dL   Albumin 4.4 3.5 - 5.0 g/dL   AST 37 15 - 41 U/L   ALT 20 0 - 44 U/L   Alkaline Phosphatase 54 38 - 126 U/L   Total Bilirubin 2.6 (H) 0.3 - 1.2 mg/dL   GFR, Estimated >69 >62 mL/min   Anion gap 11 5 - 15  CBC     Status: None   Collection Time: 03/03/23  6:41 PM  Result Value Ref Range   WBC 6.0 4.0 - 10.5 K/uL   RBC 4.90 4.22 - 5.81 MIL/uL   Hemoglobin 16.6 13.0 - 17.0 g/dL   HCT 95.2 84.1 - 32.4 %   MCV 95.1 80.0 - 100.0 fL   MCH 33.9 26.0 - 34.0 pg   MCHC 35.6 30.0 - 36.0 g/dL   RDW 40.1 02.7 - 25.3 %   Platelets 182 150 - 400 K/uL   nRBC 0.0 0.0 - 0.2 %  D-dimer, quantitative     Status: None   Collection Time: 03/03/23  7:12 PM  Result Value Ref Range   D-Dimer, Quant 0.32 0.00 - 0.50 ug/mL-FEU   Radiology Studies: DG Chest Port 1 View  Result Date: 03/03/2023 CLINICAL DATA:  Cough EXAM: PORTABLE CHEST 1 VIEW COMPARISON:  05/07/2022 FINDINGS: Lungs are clear.  No pleural effusion or pneumothorax. The heart is normal in size.  Tortuous descending thoracic aortic. IMPRESSION: No acute cardiopulmonary disease. Electronically Signed   By: Charline Bills M.D.   On: 03/03/2023 19:20       Current Facility-Administered Medications (Cardiovascular):    hydrALAZINE (APRESOLINE) tablet 25 mg   irbesartan (AVAPRO) tablet 150 mg   labetalol (NORMODYNE) injection 20 mg   Current Facility-Administered Medications  (Respiratory):    ipratropium-albuterol (DUONEB) 0.5-2.5 (3) MG/3ML nebulizer solution 3 mL   Current Facility-Administered Medications (Analgesics):    acetaminophen (TYLENOL) tablet 1,000 mg   Current Facility-Administered Medications (Hematological):    cyanocobalamin (VITAMIN B12) injection 1,000 mcg   enoxaparin (LOVENOX) injection 40 mg   folic acid (FOLVITE) tablet 1 mg   Current Facility-Administered Medications (Other):    chlordiazePOXIDE (LIBRIUM) capsule 25 mg   LORazepam (ATIVAN) tablet 1-4 mg **OR** LORazepam (ATIVAN) injection 1-4 mg   multivitamin with minerals tablet 1 tablet   nicotine (NICODERM CQ - dosed in mg/24 hours) patch 21 mg   ondansetron (ZOFRAN) tablet 4 mg **OR** ondansetron (ZOFRAN) injection 4 mg   pantoprazole (PROTONIX) 80 mg /NS 100 mL IVPB   thiamine (VITAMIN B1) tablet 100 mg **OR** thiamine (VITAMIN B1) injection 100  mg  No current outpatient medications on file.  Anti-infectives (From admission, onward)    None     Continuous Infusions:  pantoprazole (PROTONIX) IV     LOS: 1 day  Gertha Calkin, MD [facility] Pager [phone] [email or web address] Password [facility] 03/03/2023, 7:51 PM

## 2023-03-03 NOTE — Progress Notes (Addendum)
Progress Note    Frank Terry  ZOX:[id number] DOB: [birth year]  DOA: 03/02/2023 PCP: Pcp, No      Brief Narrative:    Medical records reviewed and are as summarized below:  Frank Terry is a 57 y.o. male with medical history significant for alcohol use disorder, marijuana use, asthma, gout, prostate cancer, who presented to the hospital because of suicidal thoughts and alcohol withdrawal.  He drinks about 24 cans of beer a day and said he had run out of money.  He experienced tremors, palpitations and had thoughts of slitting his throat when he stopped drinking.   He was evaluated in the emergency department by the psychiatrist.  He was no longer suicidal and he does not meet eligibility criteria for inpatient psychiatric admission.  He was admitted to the hospital for alcohol withdrawal syndrome.      Assessment/Plan:   Principal Problem:   Alcohol abuse with alcohol-induced mood disorder Active Problems:   Alcohol withdrawal   Hyponatremia   ETOH abuse   Asthma, chronic   Gout   Prostate cancer   Hypertensive urgency    Body mass index is 23.25 kg/m.   Alcohol use disorder with alcohol withdrawal syndrome: Continue Ativan per CIWA protocol.  Librium has been ordered.  Continue thiamine.   Acute on chronic hyponatremia: Discontinue IV fluids since he is eating and drinking.  Chart review shows he has chronic low sodium  Hypertension with hypertensive urgency: He was previously taking valsartan 160 mg daily at home.  However, he has not taken valsartan for over a month.  Substitute irbesartan for valsartan because valsartan is nonformulary.   Recent suicidal thoughts: He is no longer suicidal.  He was evaluated by the psychiatrist.  Other comorbidities include asthma, prostate cancer, gout, osteoporosis, chronic back pain    Diet Order             Diet Heart Room service appropriate? Yes; Fluid consistency: Thin  Diet effective now                             Consultants: Psychiatrist  Procedures: None    Medications:    amLODipine  5 mg Oral Daily   chlordiazePOXIDE  25 mg Oral QID   enoxaparin (LOVENOX) injection  40 mg Subcutaneous Q24H   folic acid  1 mg Oral Daily   lisinopril  10 mg Oral Daily   multivitamin with minerals  1 tablet Oral Daily   nicotine  21 mg Transdermal Daily   thiamine  100 mg Oral Daily   Or   thiamine  100 mg Intravenous Daily   Continuous Infusions:  sodium chloride 75 mL/hr at 03/03/23 8119     Anti-infectives (From admission, onward)    None              Family Communication/Anticipated D/C date and plan/Code Status   DVT prophylaxis: enoxaparin (LOVENOX) injection 40 mg Start: 03/02/23 2200 SCDs Start: 03/02/23 1216     Code Status: Full Code  Family Communication: None Disposition Plan: Plan to discharge home tomorrow   Status is: Inpatient Remains inpatient appropriate because: Alcohol withdrawal syndrome, hyponatremia       Subjective:   Interval events noted.  He complains of chronic back pain and wants something stronger than Tylenol for pain.  Objective:    Vitals:   03/02/23 1920 03/02/23 1938 03/03/23 0417 03/03/23 0755  BP: (!) 141/92 Marland Kitchen)  160/101 (!) 144/94 (!) 162/117  Pulse: 89 79 65 86  Resp: Temp: 98.5 F (36.9 C) 98.6 F (37 C) 97.9 F (36.6 C) 98 F (36.7 C)  TempSrc: Oral  Oral Oral  SpO2: 98% 99% 100% 97%  Weight: 71.4 kg     Height:  (1.753 m)      No data found.   Intake/Output Summary (Last 24 hours) at 03/03/2023 1245 Last data filed at 03/03/2023 0751 Gross per 24 hour  Intake 1624.59 ml  Output 1625 ml  Net -0.41 ml   Filed Weights   03/02/23 0901 03/02/23 1920  Weight: 79.4 kg 71.4 kg    Exam:  GEN: NAD SKIN: Warm and dry EYES: EOMI ENT: MMM CV: RRR PULM: CTA B ABD: soft, ND, NT, +BS CNS: AAO x 3, non focal EXT: No edema or tenderness        Data Reviewed:   I  have personally reviewed following labs and imaging studies:  Labs: Labs show the following:   Basic Metabolic Panel: Recent Labs  Lab 03/01/23 1200 03/02/23 0908 03/02/23 1239 03/02/23 1924 03/03/23 0018 03/03/23 0554  NA 128* 126* 125* 127* 125* 129*  K 3.9 3.8  --   --   --  3.5  CL 97* 90*  --   --   --  96*  CO2 19* 22  --   --   --  23  GLUCOSE 82 116*  --   --   --  96  BUN 7 6  --   --   --  8  CREATININE 0.70 0.79  --   --   --  0.91  CALCIUM 8.3* 9.7  --   --   --  9.1   GFR Estimated Creatinine Clearance: 87.4 mL/min (by C-G formula based on SCr of 0.91 mg/dL). Liver Function Tests: Recent Labs  Lab 03/01/23 1200 03/02/23 0908 03/03/23 0554  AST 22 41 30  ALT ALKPHOS 46 57 50  BILITOT 0.8 1.5* 2.3*  PROT 7.0 8.1 7.2  ALBUMIN 4.1 4.8 4.1   Recent Labs  Lab 03/01/23 1200  LIPASE 30   No results for input(s): "AMMONIA" in the last 168 hours. Coagulation profile No results for input(s): "INR", "PROTIME" in the last 168 hours.  CBC: Recent Labs  Lab 03/01/23 1010 03/02/23 0908 03/03/23 0554  WBC 5.9 9.3 4.2  HGB 16.4 16.0 15.7  HCT 46.7 45.2 44.6  MCV 94.9 93.4 94.5  PLT 245 219 170   Cardiac Enzymes: No results for input(s): "CKTOTAL", "CKMB", "CKMBINDEX", "TROPONINI" in the last 168 hours. BNP (last 3 results) No results for input(s): "PROBNP" in the last 8760 hours. CBG: No results for input(s): "GLUCAP" in the last 168 hours. D-Dimer: No results for input(s): "DDIMER" in the last 72 hours. Hgb A1c: No results for input(s): "HGBA1C" in the last 72 hours. Lipid Profile: No results for input(s): "CHOL", "HDL", "LDLCALC", "TRIG", "CHOLHDL", "LDLDIRECT" in the last 72 hours. Thyroid function studies: No results for input(s): "TSH", "T4TOTAL", "T3FREE", "THYROIDAB" in the last 72 hours.  Invalid input(s): "FREET3" Anemia work up: Recent Labs    03/03/23 0551  FERRITIN 110  TIBC 424  IRON 259*   Sepsis Labs: Recent Labs   Lab 03/01/23 1010 03/02/23 0908 03/03/23 0554  WBC 5.9 9.3 4.2    Microbiology No results found for this or any previous visit (from the past 240 hour(s)).  Procedures  and diagnostic studies:  CT Abdomen Pelvis W Contrast  Result Date: 03/01/2023 CLINICAL DATA:  Dysuria for 1 month. Prostate cancer with no treatment. Abdominal pain. EXAM: CT ABDOMEN AND PELVIS WITH CONTRAST TECHNIQUE: Multidetector CT imaging of the abdomen and pelvis was performed using the standard protocol following bolus administration of intravenous contrast. RADIATION DOSE REDUCTION: This exam was performed according to the departmental dose-optimization program which includes automated exposure control, adjustment of the mA and/or kV according to patient size and/or use of iterative reconstruction technique. CONTRAST:  OMNIPAQUE IOHEXOL 300 MG/ML  SOLN COMPARISON:  05/18/2022 FINDINGS: Lower chest: Minimal motion degradation involving the lower chest. Clear lung bases. Normal heart size without pericardial or pleural effusion. Hepatobiliary: Suspect mild hepatic steatosis. A too small to characterize right hepatic lobe lesion is similar and of doubtful clinical significance. Normal gallbladder, without biliary ductal dilatation. Pancreas: Normal, without mass or ductal dilatation. Spleen: Normal in size, without focal abnormality. Adrenals/Urinary Tract: Normal adrenal glands. Left kidney is absent. Normal right kidney, without hydronephrosis. Normal urinary bladder. Stomach/Bowel: Proximal gastric underdistention. Colonic stool burden suggests constipation. Normal terminal ileum and appendix. Normal small bowel. Vascular/Lymphatic: Aortic atherosclerosis. No abdominopelvic adenopathy. Reproductive: Heterogeneous right-sided prostatic hyperenhancement, including within the peripheral gland mid and apical regions on 82 through 84 of series 2. Other: Tiny fat containing right inguinal hernia. Trace pelvic cul-de-sac fluid  including on 71/2. This is decreased compared to 05/18/2022. No free intraperitoneal air. Musculoskeletal: 3 mm anterior left-sided T10 vertebral body sclerotic lesion is unchanged, nonspecific. Compression deformities at T10 through L1 are similar. Minimal ventral canal encroachment at T12 is unchanged. Mild disc bulge at the lumbosacral junction. IMPRESSION: 1. Heterogeneous enhancement throughout the right side of the prostate. Nonspecific and most likely related to the submitted history of prostate cancer. Prostatitis could look similar. No other explanation for dysuria. 2.  Possible constipation. 3. Trace cul-de-sac fluid, decreased. 4. Nonspecific T10 tiny sclerotic lesion is unchanged. Otherwise, no evidence of metastatic disease from reported prostate cancer. Electronically Signed   By: Jeronimo Greaves M.D.   On: 03/01/2023 13:35               LOS: 1 day   Guilherme Schwenke  [facility]   Pager on [email or web address]. If 7PM-7AM, please contact night-coverage at [email or web address]     03/03/2023, 12:45 PM

## 2023-03-03 NOTE — Progress Notes (Signed)
       CROSS COVER NOTE  NAME: Frank Terry MRN: [id number] DOB : 1966-04-12    HPI/Events of Note   Report:follow up to rapid response  On review of chart: H & P clinical course and labs reviewed   Assessment and  Interventions   Assessment: Sodium now at 124 K 3.4 Chest xray without any evidence of cardiopulmonary disease Ddimer normal at 0.32 Troponin 4 BNP not elevated Plan: Ns at 50 No free water,- use gatorade albuterol nebs every 4 as needed for wheezing Potassium 40 oral x 1 dose      Donnie Mesa NP [facility]

## 2023-03-03 NOTE — Progress Notes (Signed)
Rapid response called for complaint of chest pain and shortness of breath. Patient is ETOH withdrawal. Dr. Allena Katz ordered labs, Chest X-Ray, EKG all were done. IV Protonix and vitamin B-12 ordered.    Patient will receive IV labetalol for elevated BP. Handoff given to oncoming nurse, who will administer medications and continue to assess the patient.    03/03/23 1827 03/03/23 1846  Vitals  Temp 97.9 F (36.6 C)  --   Temp Source Oral  --   BP (!) 184/124 (!) 181/121  MAP (mmHg) 140 136  BP Location Right Arm Left Arm  BP Method Automatic Automatic  Patient Position (if appropriate) Sitting Lying  Pulse Rate (!) 110 (!) 105  Pulse Rate Source Monitor Monitor  Resp 18  --   MEWS COLOR  MEWS Score Color Riki Sheer  Oxygen Therapy  SpO2 98 % 100 %  O2 Device Room Air  --   Pain Assessment  Pain Scale 0-10  --   Pain Score 7  --   Pain Type Acute pain  --   Pain Location Chest  --   Pain Orientation Anterior;Medial  --   Pain Descriptors / Indicators Crushing  --   Pain Frequency Other (Comment)  --   Pain Onset Gradual  --   Patients Stated Pain Goal 1  --   Pain Intervention(s) MD notified (Comment)  --   MEWS Score  MEWS Temp 0 0  MEWS Systolic 0 0  MEWS Pulse 1 1  MEWS RR 0 0  MEWS LOC 0 0  MEWS Score 1 1  Provider Notification  Provider Name/Title Irena Cords MD  --   Date Provider Notified 03/03/23  --   Time Provider Notified 1827  --   Method of Notification Page  --   Notification Reason Other (Comment) (rapid response team)  --   Provider response At bedside  --   Date of Provider Response 03/03/23  --   Time of Provider Response 1830  --   Rapid Response Notification  Name of Rapid Response RN Notified Rapid Response RN  --   Date Rapid Response Notified 03/03/23  --   Time Rapid Response Notified  (1830)  --

## 2023-03-04 DIAGNOSIS — E871 Hypo-osmolality and hyponatremia: Secondary | ICD-10-CM | POA: Diagnosis not present

## 2023-03-04 DIAGNOSIS — I160 Hypertensive urgency: Secondary | ICD-10-CM | POA: Diagnosis not present

## 2023-03-04 DIAGNOSIS — F1014 Alcohol abuse with alcohol-induced mood disorder: Secondary | ICD-10-CM | POA: Diagnosis not present

## 2023-03-04 LAB — BASIC METABOLIC PANEL
Anion gap: 10 (ref 5–15)
BUN: 10 mg/dL (ref 6–20)
CO2: 16 mmol/L — ABNORMAL LOW (ref 22–32)
Calcium: 8.7 mg/dL — ABNORMAL LOW (ref 8.9–10.3)
Chloride: 101 mmol/L (ref 98–111)
Creatinine, Ser: 0.77 mg/dL (ref 0.61–1.24)
GFR, Estimated: >60 mL/min (ref >60)
Glucose, Bld: 98 mg/dL (ref 70–99)
Potassium: 3.6 mmol/L (ref 3.5–5.1)
Sodium: 127 mmol/L — ABNORMAL LOW (ref 135–145)

## 2023-03-04 LAB — SODIUM, URINE, RANDOM: Sodium, Ur: 58 mmol/L

## 2023-03-04 LAB — MAGNESIUM: Magnesium: 2.2 mg/dL (ref 1.7–2.4)

## 2023-03-04 LAB — OSMOLALITY, URINE: Osmolality, Ur: 467 mOsm/kg (ref 300–900)

## 2023-03-04 LAB — PHOSPHORUS: Phosphorus: 3.3 mg/dL (ref 2.5–4.6)

## 2023-03-04 MED ORDER — HYDROCOD POLI-CHLORPHE POLI ER 10-8 MG/5ML PO SUER
5.0000 mL | Freq: Once | ORAL | Status: AC
  Administered 2023-03-04: 5 mL via ORAL
  Filled 2023-03-04: qty 5

## 2023-03-04 MED ORDER — CYANOCOBALAMIN 1000 MCG/ML IJ SOLN
1000.0000 ug | Freq: Every day | INTRAMUSCULAR | Status: DC
  Administered 2023-03-04 – 2023-03-05 (×2): 1000 ug via INTRAMUSCULAR
  Filled 2023-03-04 (×2): qty 1

## 2023-03-04 MED ORDER — GUAIFENESIN-DM 100-10 MG/5ML PO SYRP
5.0000 mL | ORAL_SOLUTION | ORAL | Status: DC | PRN
  Administered 2023-03-04: 5 mL via ORAL
  Filled 2023-03-04 (×2): qty 10

## 2023-03-04 MED ORDER — POTASSIUM CHLORIDE CRYS ER 20 MEQ PO TBCR
40.0000 meq | EXTENDED_RELEASE_TABLET | Freq: Once | ORAL | Status: AC
  Administered 2023-03-04: 40 meq via ORAL
  Filled 2023-03-04: qty 2

## 2023-03-04 MED ORDER — ALBUTEROL SULFATE (2.5 MG/3ML) 0.083% IN NEBU: 3.0000 mL | INHALATION_SOLUTION | Freq: Four times a day (QID) | RESPIRATORY_TRACT | Status: DC | PRN

## 2023-03-04 MED ORDER — BENZONATATE 100 MG PO CAPS
100.0000 mg | ORAL_CAPSULE | Freq: Three times a day (TID) | ORAL | Status: DC | PRN
  Filled 2023-03-04: qty 1

## 2023-03-04 NOTE — Progress Notes (Signed)
Mobility Specialist - Progress Note   03/04/23 1335  Mobility  Activity Refused mobility   Pt refuses mobility at this time, no reason specified. Will attempt at another date/time.   Terrilyn Saver  Mobility Specialist  03/04/23 1:35 PM

## 2023-03-04 NOTE — Progress Notes (Addendum)
Progress Note    Frank Terry  VXB:[id number] DOB: 07-15-1966  DOA: 03/02/2023 PCP: Pcp, No      Brief Narrative:    Medical records reviewed and are as summarized below:  Frank Terry is a 57 y.o. male with medical history significant for alcohol use disorder, marijuana use, asthma, gout, prostate cancer, who presented to the hospital because of suicidal thoughts and alcohol withdrawal.  He drinks about 24 cans of beer a day and said he had run out of money.  He experienced tremors, palpitations and had thoughts of slitting his throat when he stopped drinking.   He was evaluated in the emergency department by the psychiatrist.  He was no longer suicidal and he does not meet eligibility criteria for inpatient psychiatric admission.  He was admitted to the hospital for alcohol withdrawal syndrome.      Assessment/Plan:   Principal Problem:   Alcohol abuse with alcohol-induced mood disorder Active Problems:   Chest pain   Gastric ulcer   Alcohol use disorder   Alcohol withdrawal   Hyponatremia   Asthma, chronic   Gout   Prostate cancer   Hypertensive urgency    Body mass index is 23.25 kg/m.   Alcohol use disorder with alcohol withdrawal syndrome: Continue Ativan per CIWA protocol.  Continue Librium and thiamine.   Acute on chronic hyponatremia: He was restarted on IV fluids last night.   Urine osmolality was 467 and urine sodium was 58.  Discontinue IV fluids and monitor sodium level.   Hypertension with hypertensive urgency: He was previously taking valsartan 160 mg daily at home.  However, he has not taken valsartan for over a month.  Continue irbesartan.  Use IV labetalol as needed for severe hypertension.  Discontinue oral hydralazine that was added last night for short-term BP control.   Recent chest pain and dyspnea on 03/03/2023: Resolved chest x-ray was unremarkable.  Troponins and D-dimer were normal. Cough: Robitussin and Tessalon pearls as  needed.  Vitamin B12 deficiency: Vitamin B12 level was 163.  Start vitamin B12 injections.   Recent suicidal thoughts: He is no longer suicidal.  He was evaluated by the psychiatrist.   Homelessness: Patient said he is homeless and he has nowhere to go after discharge.  TOC has been consulted to assist with placement.   Other comorbidities include asthma, prostate cancer, gout, osteoporosis, chronic back pain    Diet Order             Diet Heart Room service appropriate? Yes; Fluid consistency: Thin  Diet effective now                            Consultants: Psychiatrist  Procedures: None    Medications:    budesonide (PULMICORT) nebulizer solution  0.25 mg Nebulization BID   chlordiazePOXIDE  25 mg Oral QID   cyanocobalamin  1,000 mcg Intramuscular Daily   enoxaparin (LOVENOX) injection  40 mg Subcutaneous Q24H   folic acid  1 mg Oral Daily   ipratropium-albuterol  3 mL Nebulization Q6H   irbesartan  150 mg Oral Daily   multivitamin with minerals  1 tablet Oral Daily   nicotine  21 mg Transdermal Daily   thiamine  100 mg Oral Daily   Or   thiamine  100 mg Intravenous Daily   Continuous Infusions:  sodium chloride 50 mL/hr at 03/03/23 2325     Anti-infectives (From admission, onward)  None              Family Communication/Anticipated D/C date and plan/Code Status   DVT prophylaxis: enoxaparin (LOVENOX) injection 40 mg Start: 03/02/23 2200 SCDs Start: 03/02/23 1216     Code Status: Full Code  Family Communication: None Disposition Plan: Plan to discharge home tomorrow   Status is: Inpatient Remains inpatient appropriate because: Alcohol withdrawal syndrome, hyponatremia       Subjective:   Interval events noted.  He complains of cough, tremors and not feeling well.  No chest pain or shortness of breath.  No vomiting or diarrhea  Objective:    Vitals:   03/03/23 2213 03/04/23 0446 03/04/23 0710 03/04/23 0727   BP: (!) 134/95 (!) 146/94  126/78  Pulse: 85 78  77  Resp: 18 16  18   Temp: 97.9 F (36.6 C) 97.7 F (36.5 C)  98 F (36.7 C)  TempSrc:      SpO2: 98% 98% 96% 93%  Weight:      Height:       No data found.   Intake/Output Summary (Last 24 hours) at 03/04/2023 1422 Last data filed at 03/04/2023 0093 Gross per 24 hour  Intake 202.28 ml  Output 2150 ml  Net -1947.72 ml   Filed Weights   03/02/23 0901 03/02/23 1920  Weight: 79.4 kg 71.4 kg    Exam:  GEN: NAD SKIN: No rash EYES: EOMI ENT: MMM CV: RRR PULM: Air entry adequate with laterally, no wheezing or rales at ABD: soft, ND, NT, +BS CNS: AAO x 3, non focal EXT: No edema or tenderness      Data Reviewed:   I have personally reviewed following labs and imaging studies:  Labs: Labs show the following:   Basic Metabolic Panel: Recent Labs  Lab 03/01/23 1200 03/02/23 0908 03/02/23 1239 03/02/23 1924 03/03/23 0018 03/03/23 0554 03/03/23 1841 03/04/23 0813  NA 128* 126*   < > 127* 125* 129* 124* 127*  K 3.9 3.8  --   --   --  3.5 3.4* 3.6  CL 97* 90*  --   --   --  96* 94* 101  CO2 19* 22  --   --   --  23 19* 16*  GLUCOSE 82 116*  --   --   --  96 116* 98  BUN 7 6  --   --   --  8 10 10   CREATININE 0.70 0.79  --   --   --  0.91 0.98 0.77  CALCIUM 8.3* 9.7  --   --   --  9.1 9.0 8.7*  MG  --   --   --   --   --   --  1.9 2.2  PHOS  --   --   --   --   --   --   --  3.3   < > = values in this interval not displayed.   GFR Estimated Creatinine Clearance: 99.4 mL/min (by C-G formula based on SCr of 0.77 mg/dL). Liver Function Tests: Recent Labs  Lab 03/01/23 1200 03/02/23 0908 03/03/23 0554 03/03/23 1841  AST 22 41 30 37  ALT 13 21 18 20   ALKPHOS 46 57 50 54  BILITOT 0.8 1.5* 2.3* 2.6*  PROT 7.0 8.1 7.2 7.8  ALBUMIN 4.1 4.8 4.1 4.4   Recent Labs  Lab 03/01/23 1200 03/03/23 1841  LIPASE 30 28   No results for input(s): "AMMONIA" in the last 168  hours. Coagulation profile No results  for input(s): "INR", "PROTIME" in the last 168 hours.  CBC: Recent Labs  Lab 03/01/23 1010 03/02/23 0908 03/03/23 0554 03/03/23 1841  WBC 5.9 9.3 4.2 6.0  HGB 16.4 16.0 15.7 16.6  HCT 46.7 45.2 44.6 46.6  MCV 94.9 93.4 94.5 95.1  PLT 245 219 170 182   Cardiac Enzymes: No results for input(s): "CKTOTAL", "CKMB", "CKMBINDEX", "TROPONINI" in the last 168 hours. BNP (last 3 results) No results for input(s): "PROBNP" in the last 8760 hours. CBG: No results for input(s): "GLUCAP" in the last 168 hours. D-Dimer: Recent Labs    03/03/23 1912  DDIMER 0.32   Hgb A1c: No results for input(s): "HGBA1C" in the last 72 hours. Lipid Profile: No results for input(s): "CHOL", "HDL", "LDLCALC", "TRIG", "CHOLHDL", "LDLDIRECT" in the last 72 hours. Thyroid function studies: No results for input(s): "TSH", "T4TOTAL", "T3FREE", "THYROIDAB" in the last 72 hours.  Invalid input(s): "FREET3" Anemia work up: Recent Labs    03/02/23 0908 03/03/23 0551  VITAMINB12 163*  --   FERRITIN  --  110  TIBC  --  424  IRON  --  259*   Sepsis Labs: Recent Labs  Lab 03/01/23 1010 03/02/23 0908 03/03/23 0554 03/03/23 1841  WBC 5.9 9.3 4.2 6.0    Microbiology No results found for this or any previous visit (from the past 240 hour(s)).  Procedures and diagnostic studies:  DG Chest Port 1 View  Result Date: 03/03/2023 CLINICAL DATA:  Cough EXAM: PORTABLE CHEST 1 VIEW COMPARISON:  05/07/2022 FINDINGS: Lungs are clear.  No pleural effusion or pneumothorax. The heart is normal in size.  Tortuous descending thoracic aortic. IMPRESSION: No acute cardiopulmonary disease. Electronically Signed   By: Charline Bills M.D.   On: 03/03/2023 19:20               LOS: 2 days   Caryn Gienger  [facility]   Pager on [email or web address]. If 7PM-7AM, please contact night-coverage at [email or web address]     03/04/2023, 2:22 PM

## 2023-03-05 ENCOUNTER — Other Ambulatory Visit: Payer: Self-pay

## 2023-03-05 DIAGNOSIS — F10930 Alcohol use, unspecified with withdrawal, uncomplicated: Secondary | ICD-10-CM

## 2023-03-05 DIAGNOSIS — E871 Hypo-osmolality and hyponatremia: Secondary | ICD-10-CM | POA: Diagnosis not present

## 2023-03-05 DIAGNOSIS — I160 Hypertensive urgency: Secondary | ICD-10-CM | POA: Diagnosis not present

## 2023-03-05 LAB — BASIC METABOLIC PANEL
Anion gap: 7 (ref 5–15)
BUN: 8 mg/dL (ref 6–20)
CO2: 20 mmol/L — ABNORMAL LOW (ref 22–32)
Calcium: 8.7 mg/dL — ABNORMAL LOW (ref 8.9–10.3)
Chloride: 104 mmol/L (ref 98–111)
Creatinine, Ser: 0.8 mg/dL (ref 0.61–1.24)
GFR, Estimated: >60 mL/min (ref >60)
Glucose, Bld: 94 mg/dL (ref 70–99)
Potassium: 3.7 mmol/L (ref 3.5–5.1)
Sodium: 131 mmol/L — ABNORMAL LOW (ref 135–145)

## 2023-03-05 LAB — MAGNESIUM: Magnesium: 2.2 mg/dL (ref 1.7–2.4)

## 2023-03-05 MED ORDER — CHLORDIAZEPOXIDE HCL 25 MG PO CAPS
25.0000 mg | ORAL_CAPSULE | Freq: Three times a day (TID) | ORAL | Status: DC
  Administered 2023-03-05: 25 mg via ORAL
  Filled 2023-03-05: qty 1

## 2023-03-05 MED ORDER — IRBESARTAN 150 MG PO TABS
150.0000 mg | ORAL_TABLET | Freq: Every day | ORAL | 0 refills | Status: DC
  Filled 2023-03-05: qty 30, 30d supply, fill #0

## 2023-03-05 MED ORDER — CHLORDIAZEPOXIDE HCL 5 MG PO CAPS
ORAL_CAPSULE | ORAL | 0 refills | Status: DC
  Filled 2023-03-05: qty 15, 2d supply, fill #0

## 2023-03-05 MED ORDER — VITAMIN B-12 1000 MCG PO TABS
1000.0000 ug | ORAL_TABLET | Freq: Every day | ORAL | 0 refills | Status: DC
  Filled 2023-03-05 – 2023-03-11 (×3): qty 60, 60d supply, fill #0

## 2023-03-05 MED ORDER — LORAZEPAM 2 MG/ML IJ SOLN: 1.0000 mg | INTRAMUSCULAR | Status: DC | PRN

## 2023-03-05 MED ORDER — HYDROCOD POLI-CHLORPHE POLI ER 10-8 MG/5ML PO SUER
5.0000 mL | Freq: Once | ORAL | Status: AC
  Administered 2023-03-05: 5 mL via ORAL
  Filled 2023-03-05: qty 5

## 2023-03-05 MED ORDER — NICOTINE 21 MG/24HR TD PT24
21.0000 mg | MEDICATED_PATCH | TRANSDERMAL | 0 refills | Status: DC
  Filled 2023-03-05: qty 28, 28d supply, fill #0

## 2023-03-05 MED ORDER — ALBUTEROL SULFATE HFA 108 (90 BASE) MCG/ACT IN AERS
2.0000 | INHALATION_SPRAY | Freq: Four times a day (QID) | RESPIRATORY_TRACT | 0 refills | Status: DC | PRN
  Filled 2023-03-05: qty 6.7, 25d supply, fill #0

## 2023-03-05 MED ORDER — LORAZEPAM 1 MG PO TABS
1.0000 mg | ORAL_TABLET | ORAL | Status: DC | PRN
  Administered 2023-03-05: 2 mg via ORAL
  Filled 2023-03-05: qty 2

## 2023-03-05 NOTE — Discharge Summary (Signed)
Physician Discharge Summary   Patient: Frank Terry MRN: [id number] DOB: July 13, 1966  Admit date:     03/02/2023  Discharge date: {dischdate:26783}  Discharge Physician: Lurene Shadow   PCP: Pcp, No   Recommendations at discharge:  {Tip this will not be part of the note when signed- Example include specific recommendations for outpatient follow-up, pending tests to follow-up on. (Optional):26781}  ***  Discharge Diagnoses: Principal Problem:   Alcohol abuse with alcohol-induced mood disorder Active Problems:   Chest pain   Gastric ulcer   Alcohol use disorder   Alcohol withdrawal   Hyponatremia   Asthma, chronic   Gout   Prostate cancer   Hypertensive urgency  Resolved Problems:   * No resolved hospital problems. *  Hospital Course: No notes on file  Assessment and Plan: * Alcohol abuse with alcohol-induced mood disorder Initial complaints of SI  w/ ? Slittting throat  Formally evaluated by [facility]  Ruled out from a psychiatric standpoint  Alcohol withdrawal Regular 24+ beer w/ intermittent liquor use  Zero alcohol x 24+ hours  + generalized tremors, palpitations  Start withdrawal protocol  Prn IV ativan  Thiamine, folate, multivitamin  Follow   Hyponatremia Na 126-128  Clinically dry  Gentle IVF  Serial Na  Check urine and serum studies  Goal 6-8 mEq change over 12-24 hours  Follow   Prostate cancer Not currently being treated/managed per patient   Gout Not on xanthine oxidase inhibitor   Asthma, chronic Stable from a resp standpoint  Prn albuterol       {Tip this will not be part of the note when signed Body mass index is 23.25 kg/m. , ,  (Optional):26781}  {(NOTE) Pain control PDMP Statment (Optional):26782} Consultants: *** Procedures performed: ***  Disposition: {Plan; Disposition:26390} Diet recommendation:  Discharge Diet Orders (From admission, onward)     Start     Ordered   03/05/23 0000  Diet - low sodium heart healthy         03/05/23 1344           {Diet_Plan:26776} DISCHARGE MEDICATION: Allergies as of 03/05/2023       Reactions   Penicillins         Medication List     STOP taking these medications    nicotine polacrilex 4 MG lozenge Commonly known as: Nicotine Mini   polyethylene glycol powder 17 GM/SCOOP powder Commonly known as: GNP ClearLax       TAKE these medications    albuterol 108 (90 Base) MCG/ACT inhaler Commonly known as: VENTOLIN HFA Inhale 2 puffs into the lungs every 6 (six) hours as needed for wheezing.   colchicine 0.6 MG tablet Take 1.2 mg by mouth daily as needed (gout flare ups).   cyanocobalamin 1000 MCG tablet Commonly known as: VITAMIN B12 Take 1 tablet (1,000 mcg total) by mouth daily.   irbesartan 150 MG tablet Commonly known as: AVAPRO Take 1 tablet (150 mg total) by mouth daily. Start taking on: March 06, 2023   nicotine 21 mg/24hr patch Commonly known as: NICODERM CQ - dosed in mg/24 hours Place 1 patch (21 mg total) onto the skin daily.        Discharge Exam: Filed Weights   03/02/23 0901 03/02/23 1920  Weight: 79.4 kg 71.4 kg   ***  Condition at discharge: {DC Condition:26389}  The results of significant diagnostics from this hospitalization (including imaging, microbiology, ancillary and laboratory) are listed below for reference.   Imaging Studies: [facility]  1 View  Result Date: 03/03/2023 CLINICAL DATA:  Cough EXAM: PORTABLE CHEST 1 VIEW COMPARISON:  05/07/2022 FINDINGS: Lungs are clear.  No pleural effusion or pneumothorax. The heart is normal in size.  Tortuous descending thoracic aortic. IMPRESSION: No acute cardiopulmonary disease. Electronically Signed   By: Charline Bills M.D.   On: 03/03/2023 19:20   CT Abdomen Pelvis W Contrast  Result Date: 03/01/2023 CLINICAL DATA:  Dysuria for 1 month. Prostate cancer with no treatment. Abdominal pain. EXAM: CT ABDOMEN AND PELVIS WITH CONTRAST TECHNIQUE: Multidetector CT imaging  of the abdomen and pelvis was performed using the standard protocol following bolus administration of intravenous contrast. RADIATION DOSE REDUCTION: This exam was performed according to the departmental dose-optimization program which includes automated exposure control, adjustment of the mA and/or kV according to patient size and/or use of iterative reconstruction technique. CONTRAST:  OMNIPAQUE IOHEXOL 300 MG/ML  SOLN COMPARISON:  05/18/2022 FINDINGS: Lower chest: Minimal motion degradation involving the lower chest. Clear lung bases. Normal heart size without pericardial or pleural effusion. Hepatobiliary: Suspect mild hepatic steatosis. A too small to characterize right hepatic lobe lesion is similar and of doubtful clinical significance. Normal gallbladder, without biliary ductal dilatation. Pancreas: Normal, without mass or ductal dilatation. Spleen: Normal in size, without focal abnormality. Adrenals/Urinary Tract: Normal adrenal glands. Left kidney is absent. Normal right kidney, without hydronephrosis. Normal urinary bladder. Stomach/Bowel: Proximal gastric underdistention. Colonic stool burden suggests constipation. Normal terminal ileum and appendix. Normal small bowel. Vascular/Lymphatic: Aortic atherosclerosis. No abdominopelvic adenopathy. Reproductive: Heterogeneous right-sided prostatic hyperenhancement, including within the peripheral gland mid and apical regions on 82 through 84 of series 2. Other: Tiny fat containing right inguinal hernia. Trace pelvic cul-de-sac fluid including on 71/2. This is decreased compared to 05/18/2022. No free intraperitoneal air. Musculoskeletal: 3 mm anterior left-sided T10 vertebral body sclerotic lesion is unchanged, nonspecific. Compression deformities at T10 through L1 are similar. Minimal ventral canal encroachment at T12 is unchanged. Mild disc bulge at the lumbosacral junction. IMPRESSION: 1. Heterogeneous enhancement throughout the right side of the  prostate. Nonspecific and most likely related to the submitted history of prostate cancer. Prostatitis could look similar. No other explanation for dysuria. 2.  Possible constipation. 3. Trace cul-de-sac fluid, decreased. 4. Nonspecific T10 tiny sclerotic lesion is unchanged. Otherwise, no evidence of metastatic disease from reported prostate cancer. Electronically Signed   By: Jeronimo Greaves M.D.   On: 03/01/2023 13:35    Microbiology: Results for orders placed or performed during the hospital encounter of 05/18/22  Chlamydia/NGC rt PCR ([facility] only)     Status: None   Collection Time: 05/18/22 10:58 AM  Result Value Ref Range Status   Specimen source GC/Chlam URINE, RANDOM  Final   Chlamydia Tr NOT DETECTED NOT DETECTED Final   N gonorrhoeae NOT DETECTED NOT DETECTED Final    Comment: (NOTE) This CT/NG assay has not been evaluated in patients with a history of  hysterectomy. Performed at [facility], [address] [place], [place] [address]     Labs: CBC: Recent Labs  Lab 03/01/23 1010 03/02/23 0908 03/03/23 0554 03/03/23 1841  WBC 5.9 9.3 4.2 6.0  HGB 16.4 16.0 15.7 16.6  HCT 46.7 45.2 44.6 46.6  MCV 94.9 93.4 94.5 95.1  PLT 245 219 170 182   Basic Metabolic Panel: Recent Labs  Lab 03/02/23 0908 03/02/23 1239 03/03/23 0018 03/03/23 0554 03/03/23 1841 03/04/23 0813 03/05/23 0833  NA 126*   < > 125* 129* 124* 127* 131*  K  3.8  --   --  3.5 3.4* 3.6 3.7  CL 90*  --   --  96* 94* 101 104  CO2 22  --   --  23 19* 16* 20*  GLUCOSE 116*  --   --  96 116* 98 94  BUN 6  --   --  8 10 10 8   CREATININE 0.79  --   --  0.91 0.98 0.77 0.80  CALCIUM 9.7  --   --  9.1 9.0 8.7* 8.7*  MG  --   --   --   --  1.9 2.2 2.2  PHOS  --   --   --   --   --  3.3  --    < > = values in this interval not displayed.   Liver Function Tests: Recent Labs  Lab 03/01/23 1200 03/02/23 0908 03/03/23 0554 03/03/23 1841  AST 22 41 30 37  ALT 13 21 18 20   ALKPHOS 46 57 50 54   BILITOT 0.8 1.5* 2.3* 2.6*  PROT 7.0 8.1 7.2 7.8  ALBUMIN 4.1 4.8 4.1 4.4   CBG: No results for input(s): "GLUCAP" in the last 168 hours.  Discharge time spent: {LESS THAN/GREATER YDXA:12878} 30 minutes.  Signed: Lurene Shadow, MD [facility] 03/05/2023

## 2023-03-05 NOTE — Care Management Important Message (Signed)
Important Message  Patient Details  Name: Frank Terry MRN: [id number] Date of Birth: 04/22/1966   Medicare Important Message Given:  Yes     Olegario Messier A Shajuana Mclucas 03/05/2023, 1:45 PM

## 2023-03-05 NOTE — TOC Transition Note (Addendum)
Transition of Care [facility]) - CM/SW Discharge Note   Patient Details  Name: MANDELA ABBOT MRN: [id number] Date of Birth: August 21, 1966  [facility] CM/SW Contact:  Allena Katz, LCSW Phone Number: 03/05/2023, 2:26 PM   Clinical Narrative:   CSW spoke with patient about shelter options. Pt semi agreeable to go to shelter in [place] but CSW called and the shelter was full at this time. CSW offered alternative shelter options to patient and patient declined stating he would discharge back to the street. Pt reports he does not want transport arranged to anywhere at this time. Resources for substance use and shelter options added to AVS. CSW signing off.           Patient Goals and CMS Choice      Discharge Placement                         Discharge Plan and Services Additional resources added to the After Visit Summary for                                       Social Determinants of Health (SDOH) Interventions SDOH Screenings   Food Insecurity: No Food Insecurity (03/02/2023)  Housing: Low Risk  (03/02/2023)  Transportation Needs: No Transportation Needs (03/02/2023)  Utilities: Not At Risk (03/02/2023)  Tobacco Use: High Risk (03/03/2023)     Readmission Risk Interventions     No data to display

## 2023-03-05 NOTE — Plan of Care (Signed)
  Problem: Health Behavior/Discharge Planning: Goal: Ability to manage health-related needs will improve Outcome: Progressing   Problem: Clinical Measurements: Goal: Ability to maintain clinical measurements within normal limits will improve Outcome: Progressing Goal: Respiratory complications will improve Outcome: Adequate for Discharge Goal: Cardiovascular complication will be avoided Outcome: Adequate for Discharge   Problem: Activity: Goal: Risk for activity intolerance will decrease Outcome: Progressing

## 2023-03-06 ENCOUNTER — Other Ambulatory Visit: Payer: Self-pay

## 2023-03-06 ENCOUNTER — Telehealth: Payer: Self-pay | Admitting: *Deleted

## 2023-03-06 NOTE — Transitions of Care (Post Inpatient/ED Visit) (Signed)
   03/06/2023  Name: Frank Terry MRN: [id number] DOB: 05-Aug-1966  Today's TOC FU Call Status: Today's TOC FU Call Status:: Unsuccessul Call (1st Attempt) Unsuccessful Call (1st Attempt) Date: 03/06/23  Attempted to reach the patient regarding the most recent Inpatient/ED visit.  Follow Up Plan: Additional outreach attempts will be made to reach the patient to complete the Transitions of Care (Post Inpatient/ED visit) call.   Gean Maidens BSN RN [facility] [phone]

## 2023-03-08 ENCOUNTER — Telehealth: Payer: Self-pay | Admitting: *Deleted

## 2023-03-08 NOTE — Transitions of Care (Post Inpatient/ED Visit) (Signed)
   03/08/2023  Name: Frank Terry MRN: [id number] DOB: Jan 07, 1966  Today's TOC FU Call Status: Today's TOC FU Call Status:: Unsuccessful Call (2nd Attempt) Unsuccessful Call (2nd Attempt) Date: 03/08/23  Attempted to reach the patient regarding the most recent Inpatient/ED visit.  Follow Up Plan: Additional outreach attempts will be made to reach the patient to complete the Transitions of Care (Post Inpatient/ED visit) call.   Gean Maidens BSN RN [facility] [phone]

## 2023-03-09 ENCOUNTER — Telehealth: Payer: Self-pay | Admitting: *Deleted

## 2023-03-09 ENCOUNTER — Encounter: Payer: Medicare HMO | Admitting: *Deleted

## 2023-03-09 ENCOUNTER — Other Ambulatory Visit: Payer: Self-pay

## 2023-03-09 NOTE — Progress Notes (Signed)
  Care Coordination   Note   03/09/2023 Name: SHLOIMA CLINCH MRN: [id number] DOB: 1966/09/15  TALLIN HART is a 57 y.o. year old male who sees Pcp, No for primary care. I reached out to [name] by phone today to offer care coordination services in finding a PCP. [facility] is not able to see patient.    Follow up plan:  The care guide will reach out to the patient again over the next 7 days.  [facility]  Care Coordination Care Guide  Direct Dial: [phone]

## 2023-03-09 NOTE — Transitions of Care (Post Inpatient/ED Visit) (Signed)
   03/09/2023  Name: Frank Terry MRN: [id number] DOB: 1966-06-01  Today's TOC FU Call Status: Today's TOC FU Call Status:: Successful TOC FU Call Competed TOC FU Call Complete Date: 03/09/23  Transition Care Management Follow-up Telephone Call Date of Discharge: 03/05/23 Discharge Facility: [facility]) Type of Discharge: Inpatient Admission Primary Inpatient Discharge Diagnosis:: Alcohol abuse with aalcohol induced mood disorder How have you been since you were released from the hospital?: Better Any questions or concerns?: Yes Patient Questions/Concerns:: I am on the street sleeping on the cold ground. Patient Questions/Concerns Addressed: Other: (Referred to [facility] social worker)  Items Reviewed: Did you receive and understand the discharge instructions provided?: Yes Medications obtained and verified?: Yes (Medications Reviewed) Any new allergies since your discharge?: No Dietary orders reviewed?: No Do you have support at home?: No (homeless andl lives on street)  Home Care and Equipment/Supplies: Were Home Health Services Ordered?: No Any new equipment or medical supplies ordered?: No  Functional Questionnaire: Do you need assistance with bathing/showering or dressing?: No Do you need assistance with meal preparation?: No Do you need assistance with eating?: No Do you have difficulty maintaining continence: No Do you need assistance with getting out of bed/getting out of a chair/moving?: No Do you have difficulty managing or taking your medications?: No  Follow up appointments reviewed: PCP Follow-up appointment confirmed?: No (Pt does not have a PCP. patient has been to PCP in past that retired. Checking with practice to see if they will assign another Dr.) MD Provider Line [phone] Given: Yes Specialist Hospital Follow-up appointment confirmed?: NA Do you need transportation to your follow-up appointment?: Yes Transportation Need  Intervention Addressed By:: Other: (Trying to get a PCP appt and then will refer for transportation. Patient has been referred to SW) Do you understand care options if your condition(s) worsen?: Yes-patient verbalized understanding  SDOH Interventions Today    Flowsheet Row Most Recent Value  SDOH Interventions   Housing Interventions AMB Referral  Transportation Interventions AMB Referral      Interventions Today    Flowsheet Row Most Recent Value  General Interventions   General Interventions Discussed/Reviewed General Interventions Discussed, General Interventions Reviewed, Doctor Visits  Doctor Visits Discussed/Reviewed Doctor Visits Discussed, PCP  Mental Health Interventions   Mental Health Discussed/Reviewed Refer to Social Work for resources      [facility] Interventions Today    Flowsheet Row Most Recent Value  TOC Interventions   TOC Interventions Discussed/Reviewed TOC Interventions Discussed, TOC Interventions Reviewed  [referring to SW and no PCP. Per patient his PCP retired. Contacting practice to see if another PCP will take him as patient.]      TOC Interventions Today    Flowsheet Row Most Recent Value  TOC Interventions   TOC Interventions Discussed/Reviewed TOC Interventions Discussed, TOC Interventions Reviewed  [referring to SW and no PCP. Per patient his PCP retired. Contacting practice to see if another PCP will take him as patient.]      Interventions Today    Flowsheet Row Most Recent Value  General Interventions   General Interventions Discussed/Reviewed General Interventions Discussed, General Interventions Reviewed, Doctor Visits  Doctor Visits Discussed/Reviewed Doctor Visits Discussed, PCP  Mental Health Interventions   Mental Health Discussed/Reviewed Refer to Social Work for resources        [employer] BSN RN [facility] [phone]

## 2023-03-11 ENCOUNTER — Other Ambulatory Visit: Payer: Self-pay

## 2023-03-12 ENCOUNTER — Ambulatory Visit: Payer: Self-pay

## 2023-03-12 NOTE — Patient Outreach (Signed)
  Care Coordination   Initial Visit Note   03/12/2023 Name: Frank Terry MRN: [id number] DOB: 1966/05/14  Frank Terry is a 57 y.o. year old male who sees Pcp, No for primary care. I spoke with  Frank Terry by phone today.  What matters to the patients health and wellness today?  Patient wants to move into an apartment with his friend.  He is currently in a tent, but plans to get a hotel room and split the cost with his friend until they can locate an apartment.    Goals Addressed             This Visit's Progress    Find affordable housing       Care Coordination Interventions:  Patient has been provided a list of shelter options and he declined [facility] in [place] Patient has contacted [facility] in [place] and waiting for follow up call Patient is familiar with the bus system and knows how to get to connecting cities Patient is familiar with locations for daily hot meals Patient has income of [phone] after child support deductions        SDOH assessments and interventions completed:  Yes  SDOH Interventions Today    Flowsheet Row Most Recent Value  SDOH Interventions   Housing Interventions Other (Comment)  [Homeless since 03/01/23.  Evicted.  Living in a tent with a friend.]  Transportation Interventions Intervention Not Indicated        Care Coordination Interventions:  Yes, provided   -CM agreed to email patient a website for affordable housing.    Follow up plan: No further intervention required.   Encounter Outcome:  Pt. Visit Completed   Lysle Morales, BSW Social Worker [facility] Care Management  [phone]

## 2023-03-12 NOTE — Patient Instructions (Signed)
Visit Information  Thank you for taking time to visit with me today. Please don't hesitate to contact me if I can be of assistance to you.   Following are the goals we discussed today:   Goals Addressed             This Visit's Progress    Find affordable housing       Care Coordination Interventions:  Patient has been provided a list of shelter options and he declined [facility] in [place] Patient has contacted [facility] in [place] and waiting for follow up call Patient is familiar with the bus system and knows how to get to connecting cities Patient is familiar with locations for daily hot meals Patient has income of $1491 after child support deductions          If you are experiencing a Mental Health or Behavioral Health Crisis or need someone to talk to, please call 911  Patient verbalizes understanding of instructions and care plan provided today and agrees to view in MyChart. Active MyChart status and patient understanding of how to access instructions and care plan via MyChart confirmed with patient.     No further follow up required:    Lysle Morales, BSW Social Worker [facility] Care Management  [phone]

## 2023-03-15 ENCOUNTER — Telehealth: Payer: Self-pay | Admitting: *Deleted

## 2023-03-15 NOTE — Telephone Encounter (Signed)
Telephone encounter was:  Successful.  03/15/2023 Name: Frank Terry MRN: [id number] DOB: 1966-02-27  Frank Terry is a 57 y.o. year old male who is a primary care patient of Pcp, No . The community resource team was consulted for assistance with Transportation Needs   Care guide performed the following interventions: Patient provided with information about care guide support team and interviewed to confirm resource needs. Muse family appt made with [facility] transportation as a shortnotice visit , Patient has [employer] benefits to use in the future    Follow Up Plan:  No further follow up planned at this time. The patient has been provided with needed resources.  Frank Terry -[facility] [place], Population Health [phone] [address] [place] , [place] [address] Email : [email or web address]       Frank Terry DOB: [birth year] MRN: [id number]   RIDER WAIVER AND RELEASE OF LIABILITY  For purposes of improving physical access to our facilities, [facility] is pleased to partner with third parties to provide [facility] patients or other authorized individuals the option of convenient, on-demand ground transportation services (the [employer]") through use of the technology service that enables users to request on-demand ground transportation from independent third-party providers.  By opting to use and accept these [employer], I, the undersigned, hereby agree on behalf of myself, and on behalf of any minor child using the Science writer for whom I am the parent or legal guardian, as follows:  Science writer provided to me are provided by independent third-party transportation providers who are not [employer] or employees and who are unaffiliated with [employer]. [facility] is neither a transportation carrier nor a common or public carrier. [facility] has no control over the quality or safety of the transportation that  occurs as a result of the [employer]. [facility] cannot guarantee that any third-party transportation provider will complete any arranged transportation service. [facility] makes no representation, warranty, or guarantee regarding the reliability, timeliness, quality, safety, suitability, or availability of any of the Transport Services or that they will be error free. I fully understand that traveling by vehicle involves risks and dangers of serious bodily injury, including permanent disability, paralysis, and death. I agree, on behalf of myself and on behalf of any minor child using the Transport Services for whom I am the parent or legal guardian, that the entire risk arising out of my use of the [employer] remains solely with me, to the maximum extent permitted under applicable law. The [employer] are provided "as is" and "as available." [facility] disclaims all representations and warranties, express, implied or statutory, not expressly set out in these terms, including the implied warranties of merchantability and fitness for a particular purpose. I hereby waive and release [facility], its agents, employees, officers, directors, representatives, insurers, attorneys, assigns, successors, subsidiaries, and affiliates from any and all past, present, or future claims, demands, liabilities, actions, causes of action, or suits of any kind directly or indirectly arising from acceptance and use of the [employer]. I further waive and release [facility] and its affiliates from all present and future liability and responsibility for any injury or death to persons or damages to property caused by or related to the use of the [employer]. I have read this Waiver and Release of Liability, and I understand the terms used in it and their legal significance. This Waiver is freely and voluntarily given with  the understanding that my right (as well as the right of any minor  child for whom I am the parent or legal guardian using the [employer]) to legal recourse against [place] [place] in connection with the [employer] is knowingly surrendered in return for use of these services.   I attest that I read the consent document to Frank Terry, gave Mr. Dykema the opportunity to ask questions and answered the questions asked (if any). I affirm that Frank Terry then provided consent for he's participation in this program.     Frank Terry

## 2023-03-15 NOTE — Progress Notes (Signed)
  Care Coordination   Note   03/15/2023 Name: Frank Terry MRN: [id number] DOB: 10/19/1966  [name] is a 57 y.o. year old male who sees Pcp, No for primary care. I reached out to [name] by phone today to offer care coordination services for obtaining a new PCP. Per office if patient no shows he will not be able to reschedule patient understood.   Scheduled 03/19/23 at 2:45   [facility]  Address: [address] [place], [place], [place] [address] Hours:  Open ? Closes 5?PM Phone: [phone]  [facility] Coordination Care Guide  Direct Dial: [phone]

## 2023-03-19 ENCOUNTER — Encounter: Payer: Self-pay | Admitting: Family Medicine

## 2023-03-19 ENCOUNTER — Ambulatory Visit (INDEPENDENT_AMBULATORY_CARE_PROVIDER_SITE_OTHER): Payer: Medicare HMO | Admitting: Family Medicine

## 2023-03-19 VITALS — BP 138/80 | HR 76 | Ht 69.0 in | Wt 152.0 lb

## 2023-03-19 DIAGNOSIS — E538 Deficiency of other specified B group vitamins: Secondary | ICD-10-CM

## 2023-03-19 DIAGNOSIS — I159 Secondary hypertension, unspecified: Secondary | ICD-10-CM

## 2023-03-19 DIAGNOSIS — M25561 Pain in right knee: Secondary | ICD-10-CM

## 2023-03-19 DIAGNOSIS — G8929 Other chronic pain: Secondary | ICD-10-CM

## 2023-03-19 DIAGNOSIS — F419 Anxiety disorder, unspecified: Secondary | ICD-10-CM

## 2023-03-19 DIAGNOSIS — M4850XA Collapsed vertebra, not elsewhere classified, site unspecified, initial encounter for fracture: Secondary | ICD-10-CM | POA: Insufficient documentation

## 2023-03-19 DIAGNOSIS — M25562 Pain in left knee: Secondary | ICD-10-CM | POA: Diagnosis not present

## 2023-03-19 DIAGNOSIS — S32000S Wedge compression fracture of unspecified lumbar vertebra, sequela: Secondary | ICD-10-CM

## 2023-03-19 MED ORDER — NAPROXEN 500 MG PO TABS
500.0000 mg | ORAL_TABLET | Freq: Two times a day (BID) | ORAL | 0 refills | Status: DC
Start: 2023-03-19 — End: 2023-04-18

## 2023-03-19 MED ORDER — HYDROXYZINE HCL 10 MG PO TABS
10.0000 mg | ORAL_TABLET | Freq: Every evening | ORAL | 0 refills | Status: DC | PRN
Start: 2023-03-19 — End: 2023-04-25

## 2023-03-19 MED ORDER — VITAMIN B-12 1000 MCG PO TABS
1000.0000 ug | ORAL_TABLET | Freq: Every day | ORAL | 0 refills | Status: AC
Start: 2023-03-19 — End: 2023-05-18

## 2023-03-19 NOTE — Assessment & Plan Note (Addendum)
138/80 today. Compliant on Irbesartan 150mg  daily. Continue current regimen and reassess at follow up

## 2023-03-19 NOTE — Assessment & Plan Note (Signed)
B12 deficiency during hospitalization in 02/2023. Refilled oral B12 supplementation vs purchasing OTC depending on cost.

## 2023-03-19 NOTE — Patient Instructions (Addendum)
It was wonderful to see you today! Thank you for choosing [facility].   Please bring ALL of your medications with you to every visit.   Today we talked about:  It was very nice to meet you today! For your knee pain, I ordered x-rays of both of your knees that you can get at [facility] in [place] it you convenience. Please use the Naproxen 500mg  twice per day and Tylenol up to 4000mg  per day for pain relief. We can discuss knee injections for pain management as well. For your compression fractures in your back, I am referring you to orthopedic spine doctor since the CT in the hospital showed your compression fractures. I will defer to them for additional imaging.  Please follow up in 3-4 weeks  Call the clinic at [phone] if your symptoms worsen or you have any concerns.  Please be sure to schedule follow up at the front desk before you leave today.   Elberta Fortis, DO Family Medicine

## 2023-03-19 NOTE — Assessment & Plan Note (Signed)
Exam and history consistent with bilateral knee OA causing severe discomfort and gait instability requiring ambulation with a cane. No recent imaging and only using Ibuprofen inconsistently for pain relief. -L and R knee XRs -Naproxen 500mg  BID and Tylenol (max 4g) alternating pain management -Consider steroid injection into knee if not improving

## 2023-03-19 NOTE — Progress Notes (Signed)
Subjective:    Patient ID: Frank Terry, male    DOB: [birth year], 57 y.o.   MRN: [id number]   CC: New Patient  Back pain Reports history of osteoporosis with multiple compression fractures in his back in thoracic and lumbar region. Seen in CT during recent hospitalization in 02/2023. Previous seen by Ortho Spine in [place] but could not afford to have MRI done at that time and lost of follow up. Using Ibuprofen for relief intermittently. Fhx osteoporosis in mother. 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  Bilateral knee pain Reports significant pain in both knees constantly, R previously worsen than L but now L is worse today. Told in the past his knees are "bone on bone" and he is little cartilage left. Received knee injections back in 2010 or so with some relief. Told he will likely need knee replacements. Ambulated with cane due to locking sensation in knee that makes him feel like he may fall. Using Ibuprofen intermittently as above  Anxiety States his anxiety is "through the roof". He has been unable to sleep due to all the stress. Recent stopped drinking alcohol in the past month and now moving into sober living house. Lost his possessions during hospitalization and now has to purchase new things. Could not recall the medication he has tried for anxiety previously.   HPI: PMHx: Past Medical History:  Diagnosis Date   Asthma    Compression fracture of spine (HCC)    Gout    HTN (hypertension)    Osteoporosis    Prostate cancer (HCC)      Surgical Hx: Past Surgical History:  Procedure Laterality Date   TONSILLECTOMY       Family Hx: Family History  Problem Relation Age of Onset   Hypertension Mother    Lung cancer Mother    Osteoporosis Mother    Hypertension Father    Cancer Father        Abdominal   Heart attack Father    Hypertension Sister    Multiple sclerosis Sister      Social Hx: Current Social History 03/19/2023   Who lives at home:  Sober living house in [place], [place] Who would speak for you about health care matters: Sister - Okey Regal Transportation/food: Working it out now that he has housing  Current Stressors: Establishing housing, now in recovery from chronic alcohol use Religious / Personal Beliefs: None that impact care   Medications: Albuterol inhaler prn B12 Irbesartan 150mg  daily  Preventative Screening Colonoscopy: Yes - 2017 (normal with some diverticulosis) PSA - h/o prostate cancer, seeing urology on Friday DEXA: never been done, imaging confirmed spinal compression fractures Tetanus vaccine: Thinks UTD Pneumonia vaccine: Not done Shingles vaccine: Not done  Smoking status reviewed   Objective:  BP 138/80   Pulse 76   Ht 5\' 9"  (1.753 m)   Wt 152 lb (68.9 kg)   SpO2 98%   BMI 22.45 kg/m  Vitals and nursing note reviewed  General: Well-appearing, alert, NAD Eyes: PERRLA, anicteric sclera ENTM: Moist mucus membranes. Neck: Supple, non-tender Cardiovascular: RRR without murmur Respiratory: CTAB. Normal WOB on RA Gastrointestinal: Soft, non-tender, non-distended MSK: No peripheral edema.  -Back: Kyphosis thoracic spine noted. Protrusive bone in lower thoracic spine. TTP over entire thoracic and lumbar spine. Paraspinal hypertonicity throughout. -Knees: TTP over joint line bilaterally. Grinding sensation noted with ROM (R worse than L). No erythema or edema of joints noted. No ligamentous instability noted. Derm: Warm, dry, no rashes  noted Neuro: CN intact. Motor and sensation intact globally. Stooped gait Psych: Cooperative, pleasant   Assessment & Plan:    Hypertension 138/80 today. Compliant on Irbesartan 150mg  daily. Continue current regimen and reassess at follow up  Knee pain, bilateral Exam and history consistent with bilateral knee OA causing severe discomfort and gait instability requiring ambulation with a cane. No recent imaging and only using Ibuprofen inconsistently for pain  relief. -L and R knee XRs -Naproxen 500mg  BID and Tylenol (max 4g) alternating pain management -Consider steroid injection into knee if not improving  Compression fracture of spine (HCC) CT during hospitalization confirmed chronic compression fractures of T10-L1 vertebrae. Reports significant pain interfering with daily life. H/o osteoporosis, likely in the setting of chronic alcohol use and family history (in mother). Seen by Ortho Spine previously but lost to follow up due to financial barriers.  -Refer to Ortho spine for further evaluation and management  Anxious mood Reports anxiety is "through the roof" given recent life changes with moving and abstaining from alcohol in addition to existing chronic pain. Treated for anxiety in the past, unsure which medication. PHQ9 incomplete, scored high on initial 4 questions. Significant difficulty sleeping. -Trial Hydroxyzine 10mg  qhs prn -GAD and complete PHQ9 at next visit  B12 deficiency B12 deficiency during hospitalization in 02/2023. Refilled oral B12 supplementation vs purchasing OTC depending on cost.  Discuss lab work and vaccinations needed at next visit.  Elberta Fortis, DO

## 2023-03-19 NOTE — Assessment & Plan Note (Signed)
CT during hospitalization confirmed chronic compression fractures of T10-L1 vertebrae. Reports significant pain interfering with daily life. H/o osteoporosis, likely in the setting of chronic alcohol use and family history (in mother). Seen by Ortho Spine previously but lost to follow up due to financial barriers.  -Refer to Ortho spine for further evaluation and management

## 2023-03-19 NOTE — Assessment & Plan Note (Signed)
Reports anxiety is "through the roof" given recent life changes with moving and abstaining from alcohol in addition to existing chronic pain. Treated for anxiety in the past, unsure which medication. PHQ9 incomplete, scored high on initial 4 questions. Significant difficulty sleeping. -Trial Hydroxyzine 10mg  qhs prn -GAD and complete PHQ9 at next visit

## 2023-03-20 ENCOUNTER — Telehealth: Payer: Self-pay | Admitting: *Deleted

## 2023-03-20 NOTE — Progress Notes (Unsigned)
  Care Coordination  Outreach Note  03/20/2023 Name: Frank Terry MRN: [id number] DOB: 12/02/1965   Care Coordination Outreach Attempts: An unsuccessful telephone outreach was attempted today to offer the patient information about available care coordination services.  Follow Up Plan:  Additional outreach attempts will be made to offer the patient care coordination information and services.   Encounter Outcome:  No Answer  Christie Nottingham  Care Coordination Care Guide  Direct Dial: [phone]

## 2023-03-21 NOTE — Progress Notes (Signed)
  Care Coordination   Note   03/21/2023 Name: [name] MRN: [id number] DOB: [birth year]  Frank Terry is a 57 y.o. year old male who sees Elberta Fortis, MD for primary care. I reached out to Frank Terry by phone today to offer care coordination services.  [name] was given information about Care Coordination services today including:   The Care Coordination services include support from the care team which includes your Nurse Coordinator, Clinical Social Worker, or Pharmacist.  The Care Coordination team is here to help remove barriers to the health concerns and goals most important to you. Care Coordination services are voluntary, and the patient may decline or stop services at any time by request to their care team member.   Care Coordination Consent Status: Patient agreed to services and verbal consent obtained.   Follow up plan:  Telephone appointment with care coordination team member scheduled for:  03/22/23  Encounter Outcome:  Pt. Scheduled  [facility] Coordination Care Guide  Direct Dial: [phone]

## 2023-03-22 ENCOUNTER — Ambulatory Visit: Payer: Self-pay

## 2023-03-22 ENCOUNTER — Encounter: Payer: Self-pay | Admitting: Family Medicine

## 2023-03-22 MED ORDER — IRBESARTAN 150 MG PO TABS: 150.0000 mg | ORAL_TABLET | Freq: Every day | ORAL | 1 refills | Status: DC

## 2023-03-22 NOTE — Patient Outreach (Signed)
  Care Coordination   Initial Visit Note   03/22/2023 Name: Frank Terry MRN: [id number] DOB: May 16, 1966  Frank Terry is a 57 y.o. year old male who sees Elberta Fortis, MD for primary care. I spoke with  Frank Terry by phone today.  What matters to the patients health and wellness today?  The patient reports feeling well with his current health status, with the care from his physician. However, he mentioned that he may require assistance in connecting with a counselor in the future. I advised him to inform his physician about this need so that they can contact us for further assistance. We are always happy to help in any way we can.    Goals Addressed             This Visit's Progress    COMPLETED: Ccare Coordination Activites -no follow up rrequired        Care Coordination Interventions: Active listening / Reflection utilized  Emotional Support Provided Problem Solving /Task Center strategies reviewed Discussed/.Educated Care Coordination Program 2.   Discussed/.Educated Annual Wellness Visit 3.   Discussed/.Educated Social Determinates of Health 4.   Please inform PCP if services needed in the future         SDOH assessments and interventions completed:  No  SDOH Interventions Today    Flowsheet Row Most Recent Value  SDOH Interventions   Food Insecurity Interventions Intervention Not Indicated  Transportation Interventions Intervention Not Indicated        Care Coordination Interventions:  Yes, provided   Interventions Today    Flowsheet Row Most Recent Value  General Interventions   General Interventions Discussed/Reviewed General Interventions Discussed  [Discussed Care Coordination program]  Doctor Visits Discussed/Reviewed Doctor Visits Reviewed  Pharmacy Interventions   Pharmacy Dicussed/Reviewed Pharmacy Topics Discussed  [Dicussed blood pressure medication and put in pharamcy that he will be using near his residence]        Follow up plan:  No further intervention required.   Encounter Outcome:  Pt. Visit Completed   Juanell Fairly RN, BSN, [facility] Care Coordinator [facility]   Phone: [phone]

## 2023-03-22 NOTE — Patient Instructions (Signed)
Visit Information  Thank you for taking time to visit with me today. Please don't hesitate to contact me if I can be of assistance to you.   Following are the goals we discussed today:   Goals Addressed             This Visit's Progress    COMPLETED: Ccare Coordination Activites -no follow up rrequired        Care Coordination Interventions: Active listening / Reflection utilized  Emotional Support Provided Problem Solving /Task Center strategies reviewed Discussed/.Educated Care Coordination Program 2.   Discussed/.Educated Annual Wellness Visit 3.   Discussed/.Educated Social Determinates of Health 4.   Please inform PCP if services needed in the future         .   If you are experiencing a Mental Health or Behavioral Health Crisis or need someone to talk to, please call 1-800-273-TALK (toll free, 24 hour hotline)  Patient verbalizes understanding of instructions and care plan provided today and agrees to view in MyChart. Active MyChart status and patient understanding of how to access instructions and care plan via MyChart confirmed with patient.     Juanell Fairly RN, BSN, [facility] Care Coordinator [facility]   Phone: [phone]

## 2023-03-23 ENCOUNTER — Ambulatory Visit: Payer: Medicare HMO | Admitting: Urology

## 2023-03-23 ENCOUNTER — Ambulatory Visit
Admission: RE | Admit: 2023-03-23 | Discharge: 2023-03-23 | Disposition: A | Discharge status: Home / Self Care | Payer: Medicare HMO | Source: Ambulatory Visit | Attending: Family Medicine | Admitting: Family Medicine

## 2023-03-23 ENCOUNTER — Encounter: Payer: Self-pay | Admitting: Urology

## 2023-03-23 VITALS — BP 156/101 | HR 80 | Ht 69.0 in | Wt 152.0 lb

## 2023-03-23 DIAGNOSIS — R972 Elevated prostate specific antigen [PSA]: Secondary | ICD-10-CM | POA: Diagnosis not present

## 2023-03-23 DIAGNOSIS — R300 Dysuria: Secondary | ICD-10-CM

## 2023-03-23 DIAGNOSIS — R399 Unspecified symptoms and signs involving the genitourinary system: Secondary | ICD-10-CM | POA: Diagnosis not present

## 2023-03-23 DIAGNOSIS — M25561 Pain in right knee: Secondary | ICD-10-CM | POA: Diagnosis not present

## 2023-03-23 DIAGNOSIS — G8929 Other chronic pain: Secondary | ICD-10-CM | POA: Diagnosis not present

## 2023-03-23 DIAGNOSIS — R102 Pelvic and perineal pain: Secondary | ICD-10-CM | POA: Diagnosis not present

## 2023-03-23 DIAGNOSIS — C61 Malignant neoplasm of prostate: Secondary | ICD-10-CM | POA: Diagnosis not present

## 2023-03-23 DIAGNOSIS — M25562 Pain in left knee: Secondary | ICD-10-CM | POA: Insufficient documentation

## 2023-03-23 DIAGNOSIS — R6889 Other general symptoms and signs: Secondary | ICD-10-CM | POA: Diagnosis not present

## 2023-03-23 LAB — URINALYSIS, COMPLETE
Bilirubin, UA: NEGATIVE
Glucose, UA: NEGATIVE
Ketones, UA: NEGATIVE
Leukocytes,UA: NEGATIVE
Nitrite, UA: NEGATIVE
Protein,UA: NEGATIVE
RBC, UA: NEGATIVE
Specific Gravity, UA: 1.02 (ref 1.005–1.030)
Urobilinogen, Ur: 0.2 mg/dL (ref 0.2–1.0)
pH, UA: 5.5 (ref 5.0–7.5)

## 2023-03-23 LAB — MICROSCOPIC EXAMINATION: Bacteria, UA: NONE SEEN

## 2023-03-23 LAB — BLADDER SCAN AMB NON-IMAGING: Scan Result: 29

## 2023-03-23 MED ORDER — TAMSULOSIN HCL 0.4 MG PO CAPS: 0.4000 mg | ORAL_CAPSULE | Freq: Every day | ORAL | 2 refills | Status: DC

## 2023-03-23 NOTE — Progress Notes (Signed)
Frank Terry presents for an office/procedure visit. BP today is _5/3/2024_. He is noncompliant with BP medication. Greater than 140/90. Provider  notified. Pt advised to__Follow up with PCP_. Pt voiced understanding.

## 2023-03-23 NOTE — Progress Notes (Signed)
I, Frank Terry,acting as a scribe for Frank Altes, MD.,have documented all relevant documentation on the behalf of Frank Altes, MD,as directed by  Frank Altes, MD while in the presence of Frank Altes, MD.   03/23/2023 11:06 AM   [name] 1966-03-21 [id number]  Referring provider: Pilar Jarvis, MD [address] [place],  [place] [address]  Chief Complaint  Patient presents with   Dysuria    HPI: Frank Terry is a 57 y.o. male here for evaluation of dysuria.  ED visit 03/01/23 complaining of dysuria. Urinalysis was unremarkable. CT abdomen and pelvis with contrast was performed which showed heterogeneous enhancement on the right side of the prostate. Greater than 2 year history of suprapubic burning sensation associated with urinary frequency, urgency, hesitancy, decreased force and caliber of his urinary stream.  IPSS today 25/35 Symptoms have been worsening over the last 2 years.  States he was seen by a urologist in [place] towards the end of the pandemic for an elevated PSA and prostate biopsy was recommended. However, he did not have insurance and was unable to get evaluated.  His last PSA was checked 03/24/22 and was elevated at 16.19 Denies gross hematuria   PMH: Past Medical History:  Diagnosis Date   Asthma    Compression fracture of spine (HCC)    Gout    HTN (hypertension)    Osteoporosis    Prostate cancer [facility])     Surgical History: Past Surgical History:  Procedure Laterality Date   TONSILLECTOMY      Home Medications:  Allergies as of 03/23/2023       Reactions   Penicillins         Medication List        Accurate as of Mar 23, 2023 11:06 AM. If you have any questions, ask your nurse or doctor.          albuterol 108 (90 Base) MCG/ACT inhaler Commonly known as: VENTOLIN HFA Inhale 2 puffs into the lungs every 6 (six) hours as needed for wheezing.   cyanocobalamin 1000 MCG tablet Commonly known as: VITAMIN  B12 Take 1 tablet (1,000 mcg total) by mouth daily.   hydrOXYzine 10 MG tablet Commonly known as: ATARAX Take 1 tablet (10 mg total) by mouth at bedtime as needed.   irbesartan 150 MG tablet Commonly known as: AVAPRO Take 1 tablet (150 mg total) by mouth daily.   naproxen 500 MG tablet Commonly known as: Naprosyn Take 1 tablet (500 mg total) by mouth 2 (two) times daily with a meal.   tamsulosin 0.4 MG Caps capsule Commonly known as: FLOMAX Take 1 capsule (0.4 mg total) by mouth daily. Started by: Frank Altes, MD        Allergies:  Allergies  Allergen Reactions   Penicillins     Family History: Family History  Problem Relation Age of Onset   Hypertension Mother    Lung cancer Mother    Osteoporosis Mother    Hypertension Father    Cancer Father        Abdominal   Heart attack Father    Hypertension Sister    Multiple sclerosis Sister     Social History:  reports that he has been smoking cigarettes. He has a 45.00 pack-year smoking history. His smokeless tobacco use includes chew. He reports that he does not currently use alcohol after a past usage of about 168.0 standard drinks of alcohol per week. He  reports that he does not currently use drugs.   Physical Exam: BP (!) 156/101   Pulse 80   Ht 5\' 9"  (1.753 m)   Wt 152 lb (68.9 kg)   BMI 22.45 kg/m   Constitutional:  Alert, No acute distress. HEENT: [place] AT Respiratory: Normal respiratory effort, no increased work of breathing. GU: Prostate 35 grams, flat smooth without nodules or induration. Skin: No rashes, bruises or suspicious lesions. Neurologic: Grossly intact, no focal deficits, moving all 4 extremities. Psychiatric: Normal mood and affect.   Urinalysis Dipstick/microscopy negative  Assessment & Plan:    1. Elevated PSA Last PSA 1 year ago was 16 and was repeated today.  If PSA is persistently elevated, recommend scheduling prostate biopsy. The procedure was discussed in detail, including  potential risks of bleeding, infection/sepsis.  2. LUTS PVR 29 mL We discussed possible contributing factors, including BPH and prostate cancer.  Trial Tamsulosin 0.4 mg daily  3. Pelvic pain As above  I have reviewed the above documentation for accuracy and completeness, and I agree with the above.   Frank Altes, MD  [facility] [address] [place], [place] [address] [phone]

## 2023-03-24 LAB — PSA: Prostate Specific Ag, Serum: 33.3 ng/mL — ABNORMAL HIGH (ref 0.0–4.0)

## 2023-03-26 ENCOUNTER — Encounter: Payer: Self-pay | Admitting: *Deleted

## 2023-03-26 ENCOUNTER — Encounter: Payer: Self-pay | Admitting: Family Medicine

## 2023-03-27 ENCOUNTER — Ambulatory Visit: Payer: Medicare HMO | Admitting: Orthopedic Surgery

## 2023-03-27 ENCOUNTER — Encounter: Payer: Self-pay | Admitting: Orthopedic Surgery

## 2023-03-27 ENCOUNTER — Other Ambulatory Visit (INDEPENDENT_AMBULATORY_CARE_PROVIDER_SITE_OTHER): Payer: Medicare HMO

## 2023-03-27 VITALS — BP 163/102 | HR 71 | Ht 69.0 in | Wt 152.0 lb

## 2023-03-27 DIAGNOSIS — S32000A Wedge compression fracture of unspecified lumbar vertebra, initial encounter for closed fracture: Secondary | ICD-10-CM

## 2023-03-27 DIAGNOSIS — R6889 Other general symptoms and signs: Secondary | ICD-10-CM | POA: Diagnosis not present

## 2023-03-27 DIAGNOSIS — M5416 Radiculopathy, lumbar region: Secondary | ICD-10-CM | POA: Diagnosis not present

## 2023-03-27 NOTE — Progress Notes (Signed)
Orthopedic Spine Surgery Office Note  Assessment: Patient is a 57 y.o. male with chronic compression fracture and radicular lumbar pain/numbness   Plan:  -Patient has tried naproxen, tylenol, oral steroids  -Since he has tried conservative treatment for over 6 weeks without any relief, recommended MRI of the lumbar spine to evaluate for radiculopathy -Explained to him that he would need to be nicotine free before any elective spine surgery -Patient should return to office in 5 weeks, x-rays at next visit: none   Patient expressed understanding of the plan and all questions were answered to the patient's satisfaction.   ___________________________________________________________________________   History:  Patient is a 57 y.o. male who presents today for lumbar spine. Patient has a several year history of low back pain. States that he was initially seen at [facility] and was diagnosed with compression fractures in 2021. There was no trauma or injury that preceded the onset of the pain and he was diagnosed with osteoporosis. He states that he was told there was nothing that could be done so he was referred back to his primary care doctor. He then went to [facility] in [place] where he said they wanted to do an MRI of his thoracic and lumbar spine but that cost too much so he did not follow through on that. He comes int today with chronic thoracolumbar back pain. He feels it at the thoracolumbar junction. He also describes pain that radiates into his anterior thighs and the anterolateral aspect of his legs. He has numbness in his bilateral anterior thighs. He has had the numbness in his right thigh for several years and states the left thigh started in the last 3-4 months. No numbness or paresthesias distal to the knee on either side.     Weakness: yes, he generally feels weak. States his arms and legs feel weak on both sides Symptoms of imbalance: sometimes, no consistent unsteadiness or  imbalance Paresthesias and numbness: yes, feels numb in his bilateral anterior thighs. No other numbness or paresthesias Bowel or bladder incontinence: denies - does have post void leakage from prostate cancer  Saddle anesthesia: denies  Treatments tried: naproxen, tylenol, oral steroids  Review of systems: Denies fevers and chills, night sweats, unexplained weight loss. Has history of prostate cancer. Has had pain that wakes him at night  Past medical history: Asthma Gout Osteoporosis HTN Prostate cancer  Allergies: penicillin  Past surgical history:  Tonsillectomy  Social history: Reports use of nicotine product (smoking, vaping, patches, smokeless) Alcohol use: denies Denies recreational drug use   Physical Exam:  General: no acute distress, appears stated age Neurologic: alert, answering questions appropriately, following commands Respiratory: unlabored breathing on room air, symmetric chest rise Psychiatric: appropriate affect, normal cadence to speech   MSK (spine):  -Strength exam      Left  Right EHL    5/5  5/5 TA    5/5  5/5 GSC    5/5  5/5 Knee extension  5/5  5/5 Hip flexion   5/5  5/5  -Sensory exam    Sensation intact to light touch in L3-S1 nerve distributions of bilateral lower extremities  -Achilles DTR: 2/4 on the left, 2/4 on the right -Patellar tendon DTR: 2/4 on the left, 2/4 on the right  -Straight leg raise: negative bilaterally -Femoral nerve stretch test: negative bilaterally -Clonus: no beats bilaterally -Negative hoffman bilaterally -Negative grip and release test  -Left hip exam: no pain through range of motion, negative faber, negative stinchfield -Right hip exam:  no pain through range of motion, negative faber, negative stinchfield  Imaging: XR of the lumbar spine from 03/27/2023 was independently reviewed and interpreted, showing compression fractures at T11, T12, and L1 that appear chronic. There is focal kyphosis centered  around T12 and L1 vertebral body. No new fractures seen. No significant degenerative changes seen.   CT scan of the abdomen and pelvis was independently reviewed and interpreted, showing T11, T12, and L1 compression fractures. These appear chronic in nature with sclerotic margins. There is a lesion within the T10 vertebral body that is sclerotic and is similar to prior scan from 05/18/2022. No new vertebral fractures seen.   Patient name: Frank Terry Patient MRN: [id number] Date of visit: 03/27/23

## 2023-03-30 ENCOUNTER — Ambulatory Visit: Payer: Medicare HMO | Admitting: Urology

## 2023-03-30 ENCOUNTER — Encounter: Payer: Self-pay | Admitting: Urology

## 2023-03-30 VITALS — BP 155/93 | HR 79 | Ht 69.0 in | Wt 153.0 lb

## 2023-03-30 DIAGNOSIS — R972 Elevated prostate specific antigen [PSA]: Secondary | ICD-10-CM

## 2023-03-30 DIAGNOSIS — Z2989 Encounter for other specified prophylactic measures: Secondary | ICD-10-CM | POA: Diagnosis not present

## 2023-03-30 DIAGNOSIS — C61 Malignant neoplasm of prostate: Secondary | ICD-10-CM

## 2023-03-30 DIAGNOSIS — R6889 Other general symptoms and signs: Secondary | ICD-10-CM | POA: Diagnosis not present

## 2023-03-30 MED ORDER — LEVOFLOXACIN 500 MG PO TABS
500.0000 mg | ORAL_TABLET | Freq: Once | ORAL | Status: AC
Start: 2023-03-30 — End: 2023-03-30
  Administered 2023-03-30: 500 mg via ORAL

## 2023-03-30 MED ORDER — GENTAMICIN SULFATE 40 MG/ML IJ SOLN
80.0000 mg | Freq: Once | INTRAMUSCULAR | Status: AC
Start: 2023-03-30 — End: 2023-03-30
  Administered 2023-03-30: 80 mg via INTRAMUSCULAR

## 2023-03-30 NOTE — Patient Instructions (Signed)

## 2023-03-30 NOTE — Progress Notes (Signed)
   Prostate Biopsy Procedure   Informed consent was obtained after discussing risks/benefits of the procedure.  A time out was performed to ensure correct patient identity.  Pre-Procedure: - Last PSA Level: 33.3 on 03/23/2023 - Gentamicin given prophylactically - Levaquin 500 mg administered PO -Transrectal Ultrasound performed revealing a 37.81 gm prostate -Right PZ hypoechoic base-apex  Procedure: - Prostate block performed using 10 cc 1% lidocaine and biopsies taken from sextant areas, a total of 12 under ultrasound guidance.  Post-Procedure: - Patient tolerated the procedure well - He was counseled to seek immediate medical attention if experiences any severe pain, significant bleeding, or fevers -Will call with biopsy results   Irineo Axon, MD

## 2023-04-02 LAB — SURGICAL PATHOLOGY

## 2023-04-04 ENCOUNTER — Ambulatory Visit
Admission: RE | Admit: 2023-04-04 | Discharge: 2023-04-04 | Disposition: A | Discharge status: Home / Self Care | Payer: Medicare HMO | Source: Ambulatory Visit | Attending: Orthopedic Surgery | Admitting: Orthopedic Surgery

## 2023-04-04 DIAGNOSIS — M5416 Radiculopathy, lumbar region: Secondary | ICD-10-CM | POA: Insufficient documentation

## 2023-04-04 DIAGNOSIS — R6889 Other general symptoms and signs: Secondary | ICD-10-CM | POA: Diagnosis not present

## 2023-04-04 DIAGNOSIS — M47816 Spondylosis without myelopathy or radiculopathy, lumbar region: Secondary | ICD-10-CM | POA: Diagnosis not present

## 2023-04-04 DIAGNOSIS — M5126 Other intervertebral disc displacement, lumbar region: Secondary | ICD-10-CM | POA: Diagnosis not present

## 2023-04-05 ENCOUNTER — Telehealth: Payer: Self-pay | Admitting: Urology

## 2023-04-05 DIAGNOSIS — C61 Malignant neoplasm of prostate: Secondary | ICD-10-CM

## 2023-04-05 NOTE — Telephone Encounter (Signed)
I contacted Frank Terry on 04/04/2023 at ~ 1645 to discuss his prostate biopsy results.  He had no postbiopsy complaints.  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  Based on PSA level his NCCN stratification is high risk disease.  He is interested in pursuing radical prostatectomy if no evidence of metastatic disease and recommend scheduling PSMA/PET.  Order was placed and will contact with results

## 2023-04-06 ENCOUNTER — Encounter: Payer: Self-pay | Admitting: Student

## 2023-04-06 ENCOUNTER — Ambulatory Visit (INDEPENDENT_AMBULATORY_CARE_PROVIDER_SITE_OTHER): Payer: Medicare HMO | Admitting: Student

## 2023-04-06 VITALS — BP 128/82 | HR 81 | Ht 69.0 in | Wt 155.0 lb

## 2023-04-06 DIAGNOSIS — R5383 Other fatigue: Secondary | ICD-10-CM | POA: Diagnosis not present

## 2023-04-06 DIAGNOSIS — C61 Malignant neoplasm of prostate: Secondary | ICD-10-CM

## 2023-04-06 MED ORDER — TAMSULOSIN HCL 0.4 MG PO CAPS: 0.8000 mg | ORAL_CAPSULE | Freq: Every day | ORAL | 2 refills | Status: DC

## 2023-04-06 NOTE — Progress Notes (Signed)
    SUBJECTIVE:   CHIEF COMPLAINT / HPI:   Frank Terry is a 57 y.o. male  presenting for fatigue that has been progressively worsening for over 6 months.  He reports in the last week he has had severe fatigue to where it is disrupted his meetings and he has had to go home and lay down.  He was recently diagnosed with vitamin B12 deficiency.  He has been on supplementation for 1 month.  He reports that his symptoms really have not improved since then.  He most recently was diagnosed with prostate cancer after biopsy with his results given to him earlier this week.  He has a PET scan scheduled for next week to determine the extent of his cancer.  He reports he is doing well emotionally and he denies having severe depression or anxiety. He reports waking up at night every 1-2 hours to use the bathroom. He has been started on Flomax 0.4 mg and he reports having improvement of urinary symptoms.  He denies having feelings of depression or anxiety that he reports that he is well supported.  PERTINENT  PMH / PSH: Reviewed and updated   OBJECTIVE:   BP 128/82   Pulse 81   Ht 5\' 9"  (1.753 m)   Wt 155 lb (70.3 kg)   SpO2 100%   BMI 22.89 kg/m   Well-appearing, no acute distress Cardio: Regular rate, regular rhythm, no murmurs on exam. Pulm: Clear, no wheezing, no crackles. No increased work of breathing Abdominal: bowel sounds present, soft, non-tender, non-distended Extremities: no peripheral edema  Neuro: alert and oriented x3, speech normal in content, no facial asymmetry, strength intact and equal bilaterally in UE and LE, pupils equal and reactive to light.  Psych:  Cognition and judgment appear intact. Alert, communicative  and cooperative with normal attention span and concentration. No apparent delusions, illusions, hallucinations      04/06/2023   10:10 AM  PHQ9 SCORE ONLY  PHQ-9 Total Score 7       04/06/2023   10:10 AM  GAD 7 : Generalized Anxiety Score  Nervous, Anxious, on  Edge 2  Control/stop worrying 0  Worry too much - different things 0  Trouble relaxing 3  Restless 1  Easily annoyed or irritable 3  Afraid - awful might happen 0  Total GAD 7 Score 9  Anxiety Difficulty Somewhat difficult     ASSESSMENT/PLAN:   Fatigue CBC within normal limits 1 month ago.  Will recheck thyroid to rule out reversible causes.  With only 1 month of oral supplementation may be too early to recheck vitamin B12 level so we will defer for 18-month follow-up.  Most likely this is related to his new prostate cancer especially since they are working him up for possible metastasis.  Will continue to follow with oncology.  Offered therapy resources to patient today but he politely declined.  Prostate cancer (HCC) Will increase his Flomax 0.8 mg daily in an attempt to help with his urinary symptoms and hopefully improve his quality of sleep at night.  Cautioned the patient on orthostatic hypotension symptoms.  Has follow-up scheduled with urology in the next few weeks.   Next visit: - Follow-up thyroid -Follow-up prostate cancer progression - Follow-up B12 level on 05/2023  Glendale Chard, DO [facility]

## 2023-04-06 NOTE — Assessment & Plan Note (Addendum)
Will increase his Flomax 0.8 mg daily in an attempt to help with his urinary symptoms and hopefully improve his quality of sleep at night.  Cautioned the patient on orthostatic hypotension symptoms.  Has follow-up scheduled with urology in the next few weeks.

## 2023-04-06 NOTE — Patient Instructions (Signed)
It was great to see you today!   Today we addressed: Fatigue: I am going to check your tyroid levels to make sure they are not contributing to your fatigue.  I am also going to increase the medication called flomax to see if that helps with your urinary retention and sleep quality.   Future Appointments  Date Time Provider Department Center  04/12/2023 12:30 PM [facility]  04/12/2023  3:15 PM Elberta Fortis, MD [facility]    Please arrive 15 minutes before your appointment to ensure smooth check in process.    Please call the clinic at [phone] if your symptoms worsen or you have any concerns.  Thank you for allowing me to participate in your care, Dr. Glendale Chard [facility]

## 2023-04-06 NOTE — Assessment & Plan Note (Addendum)
CBC within normal limits 1 month ago.  Will recheck thyroid to rule out reversible causes.  With only 1 month of oral supplementation may be too early to recheck vitamin B12 level so we will defer for 85-month follow-up.  Most likely this is related to his new prostate cancer especially since they are working him up for possible metastasis.  Will continue to follow with oncology.  Offered therapy resources to patient today but he politely declined.

## 2023-04-07 LAB — TSH RFX ON ABNORMAL TO FREE T4: TSH: 0.913 u[IU]/mL (ref 0.450–4.500)

## 2023-04-11 ENCOUNTER — Other Ambulatory Visit: Payer: Medicare HMO

## 2023-04-12 ENCOUNTER — Ambulatory Visit
Admission: RE | Admit: 2023-04-12 | Discharge: 2023-04-12 | Disposition: A | Discharge status: Home / Self Care | Payer: Medicare HMO | Source: Ambulatory Visit | Attending: Urology | Admitting: Urology

## 2023-04-12 ENCOUNTER — Ambulatory Visit: Payer: Medicare HMO | Admitting: Family Medicine

## 2023-04-12 DIAGNOSIS — C61 Malignant neoplasm of prostate: Secondary | ICD-10-CM | POA: Insufficient documentation

## 2023-04-12 DIAGNOSIS — R6889 Other general symptoms and signs: Secondary | ICD-10-CM | POA: Diagnosis not present

## 2023-04-12 MED ORDER — PIFLIFOLASTAT F 18 (PYLARIFY) INJECTION
9.0000 | Freq: Once | INTRAVENOUS | Status: AC
  Administered 2023-04-12: 9.65 via INTRAVENOUS

## 2023-04-15 ENCOUNTER — Other Ambulatory Visit: Payer: Self-pay | Admitting: Family Medicine

## 2023-04-15 DIAGNOSIS — G8929 Other chronic pain: Secondary | ICD-10-CM

## 2023-04-18 ENCOUNTER — Encounter: Payer: Self-pay | Admitting: Family Medicine

## 2023-04-20 ENCOUNTER — Telehealth: Payer: Self-pay | Admitting: Urology

## 2023-04-20 NOTE — Telephone Encounter (Signed)
I contacted Frank Terry to discuss his PSMA/PET scan results.  Received his voicemail and message left to contact the office

## 2023-04-22 NOTE — Telephone Encounter (Signed)
Frank Terry called back 04/20/2023 and I discussed his PSMA/PET results.  He is interested in pursuing RALP and will set up an appointment with Dr. Apolinar Junes to discuss further who I spoke with earlier this week.

## 2023-04-24 NOTE — Telephone Encounter (Signed)
Patient advised and scheduled.  

## 2023-04-25 ENCOUNTER — Ambulatory Visit: Payer: Medicare HMO | Admitting: Urology

## 2023-04-25 VITALS — BP 161/102 | HR 69 | Ht 69.0 in | Wt 161.0 lb

## 2023-04-25 DIAGNOSIS — C61 Malignant neoplasm of prostate: Secondary | ICD-10-CM

## 2023-04-25 NOTE — Progress Notes (Signed)
Marcelle Overlie Plume,acting as a scribe for Vanna Scotland, MD.,have documented all relevant documentation on the behalf of Vanna Scotland, MD,as directed by  Vanna Scotland, MD while in the presence of Vanna Scotland, MD.  04/25/2023 3:18 PM   Frank Terry 08-08-1966 [id number]  Referring provider: Elberta Fortis, MD [address] [place],  [place] [address]  Chief Complaint  Patient presents with   discuss procedure    RALP    HPI: 57 year-old male with a personal history of prostate cancer. He presents today to discuss radical prostatectomy. He is known to have elevated PSA up to 16.9. He also has fairly significant urinary symptoms. Previous IPSS 25/35. He had a CT abdomen pelvis with contrast that showed heterogenous uptake and enhancement on the right side of the prostate. Ultimately, he underwent a prostate biopsy. He was known to have 37.81 gram prostate. Notably, his right peripheral zone was hypoechoic at the base. Surgical pathology showed high volume, Gleason 4+3 disease, all on the right side, up to 100% of the tissue, 4+3 and 3+4. He had a PSMA PET scan that showed avid uptake in the right hemiprostate along with a posterior as well as very mild radiotracer accumulation of right external iliac nodes suspicious for oligometastatic disease.   His BMI is 23. He has had no previous abdominal surgeries.      PMH: Past Medical History:  Diagnosis Date   Asthma    Compression fracture of spine (HCC)    Gout    HTN (hypertension)    Osteoporosis    Prostate cancer [facility])     Surgical History: Past Surgical History:  Procedure Laterality Date   TONSILLECTOMY      Home Medications:  Allergies as of 04/25/2023       Reactions   Penicillins         Medication List        Accurate as of April 25, 2023  3:18 PM. If you have any questions, ask your nurse or doctor.          STOP taking these medications    hydrOXYzine 10 MG tablet Commonly known as: ATARAX    tamsulosin 0.4 MG Caps capsule Commonly known as: FLOMAX   tiZANidine 2 MG tablet Commonly known as: ZANAFLEX       TAKE these medications    albuterol 108 (90 Base) MCG/ACT inhaler Commonly known as: VENTOLIN HFA Inhale 2 puffs into the lungs every 6 (six) hours as needed for wheezing.   colchicine 0.6 MG tablet Take by mouth.   cyanocobalamin 1000 MCG tablet Commonly known as: VITAMIN B12 Take 1 tablet (1,000 mcg total) by mouth daily.   irbesartan 150 MG tablet Commonly known as: AVAPRO Take 1 tablet (150 mg total) by mouth daily.   naproxen 500 MG tablet Commonly known as: NAPROSYN TAKE 1 TABLET(500 MG) BY MOUTH TWICE DAILY WITH A MEAL        Allergies:  Allergies  Allergen Reactions   Penicillins     Family History: Family History  Problem Relation Age of Onset   Hypertension Mother    Lung cancer Mother    Osteoporosis Mother    Hypertension Father    Cancer Father        Abdominal   Heart attack Father    Hypertension Sister    Multiple sclerosis Sister     Social History:  reports that he has been smoking cigarettes. He has a 45.00 pack-year smoking history.  His smokeless tobacco use includes chew. He reports that he does not currently use alcohol after a past usage of about 168.0 standard drinks of alcohol per week. He reports that he does not currently use drugs.   Physical Exam: BP (!) 161/102   Pulse 69   Ht 5\' 9"  (1.753 m)   Wt 161 lb (73 kg)   BMI 23.78 kg/m   Constitutional:  Alert and oriented, No acute distress. HEENT: [place] AT, moist mucus membranes.  Trachea midline, no masses. Abdomen: No hernias or other abnormalities noted Neurologic: Grossly intact, no focal deficits, moving all 4 extremities. Psychiatric: Normal mood and affect.  Assessment & Plan:    1. Prostate cancer - High-volume, aggressive Gleason 4+3 disease predominantly on the right side. - Urinary symptoms likely due to prostate obstruction. - Emphasized the  importance of aggressive treatment due to the patient's relatively young age and good health - He was counseled about the natural history of prostate cancer and the standard treatment options that are available for prostate cancer. It was explained to him how his age and life expectancy, clinical stage, Gleason score, and PSA affect his prognosis, the decision to proceed with additional staging studies, as well as how that information influences recommended treatment strategies. We discussed the roles for active surveillance, radiation therapy, surgical therapy, androgen deprivation, as well as ablative therapy options for the treatment of prostate cancer as appropriate to his individual cancer situation. We discussed the risks and benefits of these options with regard to their impact on cancer control and also in terms of potential adverse events, complications, and impact on quality of life particularly related to urinary, bowel, and sexual function. The patient was encouraged to ask questions throughout the discussion today and all questions were answered to his stated satisfaction. In addition, the patient was provided with and/or directed to appropriate resources and literature for further education about prostate cancer treatment options.  We discussed surgical therapy for prostate cancer including the different available surgical approaches.  Specifically, we discussed robotic prostatectomy with pelvic lymph node dissection based on his restratification.  We discussed, in detail, the risks and expectations of surgery with regard to cancer control, urinary control, and erectile dysfunction as well as expected post operative recovery processed. Additional risks of surgery including but not limited to bleeding, infection, hernia formation, nerve damage, fistula formation, bowel/rectal injury, potentially necessitating colostomy, damage to the urinary tract resulting in urinary leakage, urethral stricture, and  cardiopulmonary risk such as myocardial infarction, stroke, death, thromboembolism etc. were explained.   - To be scheduled for surgery and pre-surgical physical therapy. - Handout on Kegel exercises to be provided. - He is to be contacted by the surgical scheduler for the surgery date.  Return for radical prostatectomy.  [facility] [address] [place], [place] [address] [phone]

## 2023-04-25 NOTE — Patient Instructions (Signed)
Kegel Exercises  Kegel exercises can help strengthen your pelvic floor muscles. The pelvic floor is a group of muscles that support your rectum, small intestine, and bladder. In females, pelvic floor muscles also help support the uterus. These muscles help you control the flow of urine and stool (feces). Kegel exercises are painless and simple. They do not require any equipment. Your provider may suggest Kegel exercises to: Improve bladder and bowel control. Improve sexual response. Improve weak pelvic floor muscles after surgery to remove the uterus (hysterectomy) or after pregnancy, in females. Improve weak pelvic floor muscles after prostate gland removal or surgery, in males. Kegel exercises involve squeezing your pelvic floor muscles. These are the same muscles you squeeze when you try to stop the flow of urine or keep from passing gas. The exercises can be done while sitting, standing, or lying down, but it is best to vary your position. Ask your health care provider which exercises are safe for you. Do exercises exactly as told by your health care provider and adjust them as directed. Do not begin these exercises until told by your health care provider. Exercises How to do Kegel exercises: Squeeze your pelvic floor muscles tight. You should feel a tight lift in your rectal area. If you are a male, you should also feel a tightness in your vaginal area. Keep your stomach, buttocks, and legs relaxed. Hold the muscles tight for up to 10 seconds. Breathe normally. Relax your muscles for up to 10 seconds. Repeat as told by your health care provider. Repeat this exercise daily as told by your health care provider. Continue to do this exercise for at least 4-6 weeks, or for as long as told by your health care provider. You may be referred to a physical therapist who can help you learn more about how to do Kegel exercises. Depending on your condition, your health care provider may  recommend: Varying how long you squeeze your muscles. Doing several sets of exercises every day. Doing exercises for several weeks. Making Kegel exercises a part of your regular exercise routine. This information is not intended to replace advice given to you by your health care provider. Make sure you discuss any questions you have with your health care provider. Document Revised: 03/17/2021 Document Reviewed: 03/17/2021 Elsevier Patient Education  2024 Elsevier Inc.  

## 2023-04-25 NOTE — Progress Notes (Signed)
Frank Terry presents for an office visit. BP today is _161/102__________. He is complaint with BP medication. Greater than 140/90. Provider  notified. Pt advised to__f/u with PCP____________. Pt voiced understanding.

## 2023-04-26 ENCOUNTER — Other Ambulatory Visit: Payer: Self-pay | Admitting: Urology

## 2023-04-26 DIAGNOSIS — C61 Malignant neoplasm of prostate: Secondary | ICD-10-CM

## 2023-04-26 NOTE — Progress Notes (Signed)
Surgical Physician Order Form [facility]  * Scheduling expectation : Next Available  *Length of Case:   *Clearance needed: no  *Anticoagulation Instructions: Hold all anticoagulants  *Aspirin Instructions: Hold Aspirin  *Post-op visit Date/Instructions:  1 week cath removal, 4-6 weeks with MD / PSA prior  *Diagnosis: Prostate Cancer  *Procedure: Robotic laparoscopic Prostatectomy (46962) with bilateral pelvic LN dissection   Additional orders: N/A  -Admit type: Observation  -Anesthesia: General  -VTE Prophylaxis Standing Order SCD's       Other:   -Standing Lab Orders Per Anesthesia    Lab other:  Ua/ Ucx, CBC, BMP, INR, T&S  -Standing Test orders EKG/Chest x-ray per Anesthesia       Test other:   - Medications:  Ancef 2gm IV  -Other orders:  N/A

## 2023-04-27 ENCOUNTER — Telehealth: Payer: Self-pay

## 2023-04-27 NOTE — Telephone Encounter (Signed)
I spoke with Frank Terry. We have discussed possible surgery dates and Monday July 29th, 2024 was agreed upon by all parties. Patient given information about surgery date, what to expect pre-operatively and post operatively.  We discussed that a Pre-Admission Testing office will be calling to set up the pre-op visit that will take place prior to surgery, and that these appointments are typically done over the phone with a Pre-Admissions RN. Informed patient that our office will communicate any additional care to be provided after surgery. Patients questions or concerns were discussed during our call. Advised to call our office should there be any additional information, questions or concerns that arise. Patient verbalized understanding.

## 2023-04-27 NOTE — Progress Notes (Signed)
   [facility] Surgical Posting Form  Surgery Date: Date: 06/18/2023  Surgeon: Dr. Vanna Scotland, MD  Inpt ( No  )   Outpt (Yes)   Obs ( No  )   Diagnosis: C61 Prostate Cancer   -CPT: [phone], [phone]  Surgery: Robotic Laparoscopic Radical Prostatectomy with Bilateral Pelvic Lymph Node Dissection   Stop Anticoagulations: Yes and also hold ASA  Cardiac/Medical/Pulmonary Clearance needed: no  *Orders entered into EPIC  Date: 04/27/23   *Case booked in EPIC  Date: 04/26/2023  *Notified pt of Surgery: Date: 04/26/2023  PRE-OP UA & CX: yes and will also obtain CBC, BMP, INR, Type and Screen  *Placed into Prior Authorization Work Que Date: 04/27/23  Assistant/laser/rep:Dr. Richardo Hanks to assist

## 2023-05-04 ENCOUNTER — Ambulatory Visit (INDEPENDENT_AMBULATORY_CARE_PROVIDER_SITE_OTHER): Payer: Medicare HMO

## 2023-05-04 VITALS — Ht 69.0 in | Wt 161.0 lb

## 2023-05-04 DIAGNOSIS — Z0000 Encounter for general adult medical examination without abnormal findings: Secondary | ICD-10-CM | POA: Diagnosis not present

## 2023-05-04 NOTE — Progress Notes (Signed)
Subjective:   Frank Terry is a 57 y.o. male who presents for an Initial Medicare Annual Wellness Visit.  I connected with  [name] on 05/04/23 by a audio enabled telemedicine application and verified that I am speaking with the correct person using two identifiers.  Patient Location: Home  Provider Location: Home Office  I discussed the limitations of evaluation and management by telemedicine. The patient expressed understanding and agreed to proceed.  Review of Systems     Cardiac Risk Factors include: advanced age (>53men, >24 women);male gender;hypertension     Objective:    Today's Vitals   05/04/23 2120  Weight: 161 lb (73 kg)  Height: 5\' 9"  (1.753 m)   Body mass index is 23.78 kg/m.     05/04/2023    9:25 PM 04/06/2023    9:52 AM 03/19/2023    2:36 PM 03/02/2023    9:03 AM 03/01/2023    9:56 AM 06/30/2022    9:08 AM  Advanced Directives  Does Patient Have a Medical Advance Directive? No No No No No No  Would patient like information on creating a medical advance directive? Yes (MAU/Ambulatory/Procedural Areas - Information given) No - Patient declined No - Patient declined No - Patient declined  No - Patient declined    Current Medications (verified) Outpatient Encounter Medications as of 05/04/2023  Medication Sig   albuterol (VENTOLIN HFA) 108 (90 Base) MCG/ACT inhaler Inhale 2 puffs into the lungs every 6 (six) hours as needed for wheezing.   colchicine 0.6 MG tablet Take by mouth.   cyanocobalamin (VITAMIN B12) 1000 MCG tablet Take 1 tablet (1,000 mcg total) by mouth daily.   irbesartan (AVAPRO) 150 MG tablet Take 1 tablet (150 mg total) by mouth daily.   naproxen (NAPROSYN) 500 MG tablet TAKE 1 TABLET(500 MG) BY MOUTH TWICE DAILY WITH A MEAL   No facility-administered encounter medications on file as of 05/04/2023.    Allergies (verified) Penicillins   History: Past Medical History:  Diagnosis Date   Allergy    Anxiety    Arthritis    Asthma     Compression fracture of spine (HCC)    Depression    GERD (gastroesophageal reflux disease)    Gout    HTN (hypertension)    Osteoporosis    Prostate cancer (HCC)    Ulcer    Past Surgical History:  Procedure Laterality Date   TONSILLECTOMY     Family History  Problem Relation Age of Onset   Hypertension Mother    Lung cancer Mother    Osteoporosis Mother    Alcohol abuse Mother    Arthritis Mother    Cancer Mother    Heart disease Mother    Hypertension Father    Cancer Father        Abdominal   Heart attack Father    Hypertension Sister    Multiple sclerosis Sister    Social History   Socioeconomic History   Marital status: Divorced    Spouse name: Not on file   Number of children: Not on file   Years of education: Not on file   Highest education level: Not on file  Occupational History   Not on file  Tobacco Use   Smoking status: Every Day    Packs/day: 2.00    Years: 15.00    Additional pack years: 0.00    Total pack years: 30.00    Types: Cigarettes   Smokeless tobacco: Current  Types: Chew  Vaping Use   Vaping Use: Never used  Substance and Sexual Activity   Alcohol use: Not Currently    Alcohol/week: 168.0 standard drinks of alcohol    Types: 168 Cans of beer per week   Drug use: Not Currently   Sexual activity: Yes    Birth control/protection: None  Other Topics Concern   Not on file  Social History Narrative   Not on file   Social Determinants of Health   Financial Resource Strain: Medium Risk (05/04/2023)   Overall Financial Resource Strain (CARDIA)    Difficulty of Paying Living Expenses: Somewhat hard  Food Insecurity: No Food Insecurity (05/04/2023)   Hunger Vital Sign    Worried About Running Out of Food in the Last Year: Never true    Ran Out of Food in the Last Year: Never true  Transportation Needs: No Transportation Needs (05/04/2023)   PRAPARE - Administrator, Civil Service (Medical): No    Lack of  Transportation (Non-Medical): No  Recent Concern: Transportation Needs - Unmet Transportation Needs (03/12/2023)   PRAPARE - Administrator, Civil Service (Medical): Yes    Lack of Transportation (Non-Medical): No  Physical Activity: Sufficiently Active (05/04/2023)   Exercise Vital Sign    Days of Exercise per Week: 7 days    Minutes of Exercise per Session: 60 min  Stress: No Stress Concern Present (05/04/2023)   [employer] of Occupational Health - Occupational Stress Questionnaire    Feeling of Stress : Not at all  Social Connections: Moderately Isolated (05/04/2023)   Social Connection and Isolation Panel [NHANES]    Frequency of Communication with Friends and Family: More than three times a week    Frequency of Social Gatherings with Friends and Family: More than three times a week    Attends Religious Services: More than 4 times per year    Active Member of [employer] or Organizations: No    Attends Engineer, structural: Never    Marital Status: Divorced    Tobacco Counseling Ready to quit: No Counseling given: Not Answered   Clinical Intake:  Pre-visit preparation completed: Yes  Pain : No/denies pain     Diabetes: No  How often do you need to have someone help you when you read instructions, pamphlets, or other written materials from your doctor or pharmacy?: 1 - Never  Diabetic?No   Interpreter Needed?: No  Information entered by :: Kandis Fantasia LPN   Activities of Daily Living    05/04/2023    9:25 PM 04/30/2023    3:36 PM  In your present state of health, do you have any difficulty performing the following activities:  Hearing? 0 0  Vision? 0 0  Difficulty concentrating or making decisions? 0 0  Walking or climbing stairs? 0 0  Dressing or bathing? 0 0  Doing errands, shopping? 0 0  Preparing Food and eating ? N N  Using the Toilet? N N  In the past six months, have you accidently leaked urine? Y Y  Do you have problems with  loss of bowel control? Y Y  Managing your Medications? N N  Managing your Finances? N N  Housekeeping or managing your Housekeeping? N N    Patient Care Team: Elberta Fortis, MD as PCP - General (Family Medicine)  Indicate any recent Medical Services you may have received from other than [facility] providers in the past year (date may be approximate).  Assessment:   This is a routine wellness examination for Daegen.  Hearing/Vision screen Hearing Screening - Comments:: Denies hearing difficulties   Vision Screening - Comments:: No vision problems; will schedule routine eye exam soon    Dietary issues and exercise activities discussed: Current Exercise Habits: Home exercise routine, Type of exercise: stretching;walking, Time (Minutes): 60, Frequency (Times/Week): 5, Weekly Exercise (Minutes/Week): 300, Intensity: Moderate   Goals Addressed   None   Depression Screen    05/04/2023    9:23 PM 04/06/2023   10:10 AM  PHQ 2/9 Scores  PHQ - 2 Score 0 0  PHQ- 9 Score 7 7    Fall Risk    05/04/2023    9:22 PM 04/30/2023    3:36 PM  Fall Risk   Falls in the past year? 0 0  Number falls in past yr: 0 0  Injury with Fall? 0 0  Risk for fall due to : No Fall Risks   Follow up Falls prevention discussed;Education provided;Falls evaluation completed     FALL RISK PREVENTION PERTAINING TO THE HOME:  Any stairs in or around the home? No  If so, are there any without handrails? No  Home free of loose throw rugs in walkways, pet beds, electrical cords, etc? Yes  Adequate lighting in your home to reduce risk of falls? Yes   ASSISTIVE DEVICES UTILIZED TO PREVENT FALLS:  Life alert? No  Use of a cane, walker or w/c? No  Grab bars in the bathroom? No  Shower chair or bench in shower? No  Elevated toilet seat or a handicapped toilet? No   TIMED UP AND GO:  Was the test performed? No . Telephonic visit   Cognitive Function:        05/04/2023    9:26 PM  6CIT Screen  What  Year? 0 points  What month? 0 points  What time? 0 points  Count back from 20 0 points  Months in reverse 0 points  Repeat phrase 0 points  Total Score 0 points    Immunizations Immunization History  Administered Date(s) Administered   PFIZER(Purple Top)SARS-COV-2 Vaccination 12/01/2020    TDAP status: Due, Education has been provided regarding the importance of this vaccine. Advised may receive this vaccine at local pharmacy or Health Dept. Aware to provide a copy of the vaccination record if obtained from local pharmacy or Health Dept. Verbalized acceptance and understanding.  Pneumococcal vaccine status: Up to date  Covid-19 vaccine status: Information provided on how to obtain vaccines.   Qualifies for Shingles Vaccine? Yes   Zostavax completed No   Shingrix Completed?: No.    Education has been provided regarding the importance of this vaccine. Patient has been advised to call insurance company to determine out of pocket expense if they have not yet received this vaccine. Advised may also receive vaccine at local pharmacy or Health Dept. Verbalized acceptance and understanding.  Screening Tests Health Maintenance  Topic Date Due   Hepatitis C Screening  Never done   DTaP/Tdap/Td (1 - Tdap) Never done   Zoster Vaccines- Shingrix (1 of 2) Never done   Colonoscopy  Never done   Lung Cancer Screening  12/06/2020   COVID-19 Vaccine (2 - Pfizer risk series) 12/22/2020   INFLUENZA VACCINE  06/21/2023   Medicare Annual Wellness (AWV)  05/03/2024   HIV Screening  Completed   HPV VACCINES  Aged Out    Health Maintenance  Health Maintenance Due  Topic Date Due   Hepatitis  C Screening  Never done   DTaP/Tdap/Td (1 - Tdap) Never done   Zoster Vaccines- Shingrix (1 of 2) Never done   Colonoscopy  Never done   Lung Cancer Screening  12/06/2020   COVID-19 Vaccine (2 - Pfizer risk series) 12/22/2020    Colorectal cancer screening: Type of screening: Colonoscopy. Completed  01/31/16. Repeat every - years  Lung Cancer Screening: (Low Dose CT Chest recommended if Age 59-80 years, 30 pack-year currently smoking OR have quit w/in 15years.) does qualify.   Lung Cancer Screening Referral: will discuss with pcp  Additional Screening:  Hepatitis C Screening: does qualify;  Vision Screening: Recommended annual ophthalmology exams for early detection of glaucoma and other disorders of the eye. Is the patient up to date with their annual eye exam?  No  Who is the provider or what is the name of the office in which the patient attends annual eye exams? none If pt is not established with a provider, would they like to be referred to a provider to establish care? No .   Dental Screening: Recommended annual dental exams for proper oral hygiene  Community Resource Referral / Chronic Care Management: CRR required this visit?  No   CCM required this visit?  No      Plan:     I have personally reviewed and noted the following in the patient's chart:   Medical and social history Use of alcohol, tobacco or illicit drugs  Current medications and supplements including opioid prescriptions. Patient is not currently taking opioid prescriptions. Functional ability and status Nutritional status Physical activity Advanced directives List of other physicians Hospitalizations, surgeries, and ER visits in previous 12 months Vitals Screenings to include cognitive, depression, and falls Referrals and appointments  In addition, I have reviewed and discussed with patient certain preventive protocols, quality metrics, and best practice recommendations. A written personalized care plan for preventive services as well as general preventive health recommendations were provided to patient.     Durwin Nora, [place]   1/61/0960   Due to this being a virtual visit, the after visit summary with patients personalized plan was offered to patient via mail or my-chart.   Patient would like to access on my-chart  Nurse Notes: No concerns

## 2023-05-04 NOTE — Patient Instructions (Signed)
[name] , Thank you for taking time to come for your Medicare Wellness Visit. I appreciate your ongoing commitment to your health goals. Please review the following plan we discussed and let me know if I can assist you in the future.   These are the goals we discussed:  Goals      Find affordable housing     Care Coordination Interventions:  Patient has been provided a list of shelter options and he declined [facility] [place] Patient has contacted [facility] in [place] and waiting for follow up call Patient is familiar with the bus system and knows how to get to connecting cities Patient is familiar with locations for daily hot meals Patient has income of [phone] after child support deductions        This is a list of the screening recommended for you and due dates:  Health Maintenance  Topic Date Due   Hepatitis C Screening  Never done   DTaP/Tdap/Td vaccine (1 - Tdap) Never done   Zoster (Shingles) Vaccine (1 of 2) Never done   Colon Cancer Screening  Never done   Screening for Lung Cancer  12/06/2020   COVID-19 Vaccine (2 - Pfizer risk series) 12/22/2020   Flu Shot  06/21/2023   Medicare Annual Wellness Visit  05/03/2024   HIV Screening  Completed   HPV Vaccine  Aged Out    Advanced directives: Information on Advanced Care Planning can be found at [facility] Advance Health Care Directives Advance Health Care Directives ([email or web address] Please bring a copy of your health care power of attorney and living will to the office to be added to your chart at your convenience.  Conditions/risks identified: Aim for 30 minutes of exercise or brisk walking, 6-8 glasses of water, and 5 servings of fruits and vegetables each day.  Next appointment: Follow up in one year for your annual wellness visit   Preventive Care 40-64 Years, Male Preventive care refers to lifestyle choices and visits with your health care provider that can promote health and wellness. What does  preventive care include? A yearly physical exam. This is also called an annual well check. Dental exams once or twice a year. Routine eye exams. Ask your health care provider how often you should have your eyes checked. Personal lifestyle choices, including: Daily care of your teeth and gums. Regular physical activity. Eating a healthy diet. Avoiding tobacco and drug use. Limiting alcohol use. Practicing safe sex. Taking low-dose aspirin every day starting at age 101. What happens during an annual well check? The services and screenings done by your health care provider during your annual well check will depend on your age, overall health, lifestyle risk factors, and family history of disease. Counseling  Your health care provider may ask you questions about your: Alcohol use. Tobacco use. Drug use. Emotional well-being. Home and relationship well-being. Sexual activity. Eating habits. Work and work Astronomer. Screening  You may have the following tests or measurements: Height, weight, and BMI. Blood pressure. Lipid and cholesterol levels. These may be checked every 5 years, or more frequently if you are over 57 years old. Skin check. Lung cancer screening. You may have this screening every year starting at age 53 if you have a 30-pack-year history of smoking and currently smoke or have quit within the past 15 years. Fecal occult blood test (FOBT) of the stool. You may have this test every year starting at age 53. Flexible sigmoidoscopy or colonoscopy. You may have  a sigmoidoscopy every 5 years or a colonoscopy every 10 years starting at age 49. Prostate cancer screening. Recommendations will vary depending on your family history and other risks. Hepatitis C blood test. Hepatitis B blood test. Sexually transmitted disease (STD) testing. Diabetes screening. This is done by checking your blood sugar (glucose) after you have not eaten for a while (fasting). You may have this done  every 1-3 years. Discuss your test results, treatment options, and if necessary, the need for more tests with your health care provider. Vaccines  Your health care provider may recommend certain vaccines, such as: Influenza vaccine. This is recommended every year. Tetanus, diphtheria, and acellular pertussis (Tdap, Td) vaccine. You may need a Td booster every 10 years. Zoster vaccine. You may need this after age 81. Pneumococcal 13-valent conjugate (PCV13) vaccine. You may need this if you have certain conditions and have not been vaccinated. Pneumococcal polysaccharide (PPSV23) vaccine. You may need one or two doses if you smoke cigarettes or if you have certain conditions. Talk to your health care provider about which screenings and vaccines you need and how often you need them. This information is not intended to replace advice given to you by your health care provider. Make sure you discuss any questions you have with your health care provider. Document Released: 12/03/2015 Document Revised: 07/26/2016 Document Reviewed: 09/07/2015 Elsevier Interactive Patient Education  2017 [employer].  Fall Prevention in the Home Falls can cause injuries. They can happen to people of all ages. There are many things you can do to make your home safe and to help prevent falls. What can I do on the outside of my home? Regularly fix the edges of walkways and driveways and fix any cracks. Remove anything that might make you trip as you walk through a door, such as a raised step or threshold. Trim any bushes or trees on the path to your home. Use bright outdoor lighting. Clear any walking paths of anything that might make someone trip, such as rocks or tools. Regularly check to see if handrails are loose or broken. Make sure that both sides of any steps have handrails. Any raised decks and porches should have guardrails on the edges. Have any leaves, snow, or ice cleared regularly. Use sand or salt on  walking paths during winter. Clean up any spills in your garage right away. This includes oil or grease spills. What can I do in the bathroom? Use night lights. Install grab bars by the toilet and in the tub and shower. Do not use towel bars as grab bars. Use non-skid mats or decals in the tub or shower. If you need to sit down in the shower, use a plastic, non-slip stool. Keep the floor dry. Clean up any water that spills on the floor as soon as it happens. Remove soap buildup in the tub or shower regularly. Attach bath mats securely with double-sided non-slip rug tape. Do not have throw rugs and other things on the floor that can make you trip. What can I do in the bedroom? Use night lights. Make sure that you have a light by your bed that is easy to reach. Do not use any sheets or blankets that are too big for your bed. They should not hang down onto the floor. Have a firm chair that has side arms. You can use this for support while you get dressed. Do not have throw rugs and other things on the floor that can make you trip. What  can I do in the kitchen? Clean up any spills right away. Avoid walking on wet floors. Keep items that you use a lot in easy-to-reach places. If you need to reach something above you, use a strong step stool that has a grab bar. Keep electrical cords out of the way. Do not use floor polish or wax that makes floors slippery. If you must use wax, use non-skid floor wax. Do not have throw rugs and other things on the floor that can make you trip. What can I do with my stairs? Do not leave any items on the stairs. Make sure that there are handrails on both sides of the stairs and use them. Fix handrails that are broken or loose. Make sure that handrails are as long as the stairways. Check any carpeting to make sure that it is firmly attached to the stairs. Fix any carpet that is loose or worn. Avoid having throw rugs at the top or bottom of the stairs. If you do  have throw rugs, attach them to the floor with carpet tape. Make sure that you have a light switch at the top of the stairs and the bottom of the stairs. If you do not have them, ask someone to add them for you. What else can I do to help prevent falls? Wear shoes that: Do not have high heels. Have rubber bottoms. Are comfortable and fit you well. Are closed at the toe. Do not wear sandals. If you use a stepladder: Make sure that it is fully opened. Do not climb a closed stepladder. Make sure that both sides of the stepladder are locked into place. Ask someone to hold it for you, if possible. Clearly mark and make sure that you can see: Any grab bars or handrails. First and last steps. Where the edge of each step is. Use tools that help you move around (mobility aids) if they are needed. These include: Canes. Walkers. Scooters. Crutches. Turn on the lights when you go into a dark area. Replace any light bulbs as soon as they burn out. Set up your furniture so you have a clear path. Avoid moving your furniture around. If any of your floors are uneven, fix them. If there are any pets around you, be aware of where they are. Review your medicines with your doctor. Some medicines can make you feel dizzy. This can increase your chance of falling. Ask your doctor what other things that you can do to help prevent falls. This information is not intended to replace advice given to you by your health care provider. Make sure you discuss any questions you have with your health care provider. Document Released: 09/02/2009 Document Revised: 04/13/2016 Document Reviewed: 12/11/2014 Elsevier Interactive Patient Education  2017 [employer].

## 2023-05-15 ENCOUNTER — Other Ambulatory Visit: Payer: Self-pay

## 2023-05-15 DIAGNOSIS — G8929 Other chronic pain: Secondary | ICD-10-CM

## 2023-05-16 MED ORDER — NAPROXEN 500 MG PO TABS
ORAL_TABLET | ORAL | 0 refills | Status: DC
Start: 2023-05-16 — End: 2023-05-21

## 2023-05-17 ENCOUNTER — Encounter: Payer: Self-pay | Admitting: Urology

## 2023-05-21 ENCOUNTER — Emergency Department
Admission: EM | Admit: 2023-05-21 | Discharge: 2023-05-21 | Disposition: A | Discharge status: Home / Self Care | Payer: Medicare HMO | Attending: Emergency Medicine | Admitting: Emergency Medicine

## 2023-05-21 ENCOUNTER — Other Ambulatory Visit: Payer: Self-pay

## 2023-05-21 ENCOUNTER — Encounter: Payer: Self-pay | Admitting: Emergency Medicine

## 2023-05-21 ENCOUNTER — Telehealth: Payer: Self-pay

## 2023-05-21 DIAGNOSIS — R1909 Other intra-abdominal and pelvic swelling, mass and lump: Secondary | ICD-10-CM | POA: Diagnosis present

## 2023-05-21 DIAGNOSIS — R1032 Left lower quadrant pain: Secondary | ICD-10-CM | POA: Diagnosis not present

## 2023-05-21 DIAGNOSIS — N491 Inflammatory disorders of spermatic cord, tunica vaginalis and vas deferens: Secondary | ICD-10-CM | POA: Diagnosis not present

## 2023-05-21 HISTORY — DX: Inflammatory disorders of spermatic cord, tunica vaginalis and vas deferens: N49.1

## 2023-05-21 LAB — CBC
HCT: 44.8 % (ref 39.0–52.0)
Hemoglobin: 15.2 g/dL (ref 13.0–17.0)
MCH: 33.3 pg (ref 26.0–34.0)
MCHC: 33.9 g/dL (ref 30.0–36.0)
MCV: 98.2 fL (ref 80.0–100.0)
Platelets: 224 10*3/uL (ref 150–400)
RBC: 4.56 MIL/uL (ref 4.22–5.81)
RDW: 12.9 % (ref 11.5–15.5)
WBC: 6.7 10*3/uL (ref 4.0–10.5)
nRBC: 0 % (ref 0.0–0.2)

## 2023-05-21 LAB — BASIC METABOLIC PANEL
Anion gap: 10 (ref 5–15)
BUN: 17 mg/dL (ref 6–20)
CO2: 21 mmol/L — ABNORMAL LOW (ref 22–32)
Calcium: 9.1 mg/dL (ref 8.9–10.3)
Chloride: 106 mmol/L (ref 98–111)
Creatinine, Ser: 1.14 mg/dL (ref 0.61–1.24)
GFR, Estimated: >60 mL/min (ref >60)
Glucose, Bld: 83 mg/dL (ref 70–99)
Potassium: 3.9 mmol/L (ref 3.5–5.1)
Sodium: 137 mmol/L (ref 135–145)

## 2023-05-21 LAB — URINALYSIS, ROUTINE W REFLEX MICROSCOPIC
Bilirubin Urine: NEGATIVE
Glucose, UA: NEGATIVE mg/dL
Ketones, ur: NEGATIVE mg/dL
Leukocytes,Ua: NEGATIVE
Nitrite: NEGATIVE
Protein, ur: NEGATIVE mg/dL
Specific Gravity, Urine: 1.005 (ref 1.005–1.030)
Squamous Epithelial / HPF: NONE SEEN /HPF (ref 0–5)
pH: 5 (ref 5.0–8.0)

## 2023-05-21 MED ORDER — TRAMADOL HCL 50 MG PO TABS
50.0000 mg | ORAL_TABLET | Freq: Four times a day (QID) | ORAL | 0 refills | Status: DC | PRN
  Filled 2023-05-21: qty 20, 5d supply, fill #0

## 2023-05-21 MED ORDER — KETOROLAC TROMETHAMINE 30 MG/ML IJ SOLN
30.0000 mg | Freq: Once | INTRAMUSCULAR | Status: AC
  Administered 2023-05-21: 30 mg via INTRAMUSCULAR
  Filled 2023-05-21: qty 1

## 2023-05-21 MED ORDER — LEVOFLOXACIN 500 MG PO TABS
500.0000 mg | ORAL_TABLET | Freq: Every day | ORAL | 0 refills | Status: AC
  Filled 2023-05-21: qty 10, 10d supply, fill #0

## 2023-05-21 NOTE — Discharge Instructions (Signed)
Please take the antibiotic as prescribed.

## 2023-05-21 NOTE — ED Provider Notes (Signed)
[facility] Provider Note    Event Date/Time   First MD Initiated Contact with Patient 05/21/23 1519     (approximate)   History   Groin Swelling   HPI  Frank Terry is a 57 y.o. male who presents with complaints of left-sided groin swelling which she describes as uncomfortable.  He reports it feels similar to when he had vasitis diagnosed on CT scan a year or 2 ago at [facility].  Review of records demonstrated have vasitis on the side and presentation is certainly consistent.  He does describe some dysuria     Physical Exam   Triage Vital Signs: ED Triage Vitals  Enc Vitals Group     BP 05/21/23 1510 (!) 168/109     Pulse Rate 05/21/23 1510 96     Resp 05/21/23 1510 18     Temp 05/21/23 1510 98 F (36.7 C)     Temp Source 05/21/23 1510 Oral     SpO2 05/21/23 1510 100 %     Weight 05/21/23 1511 73.5 kg (162 lb)     Height 05/21/23 1511 1.753 m (5\' 9" )     Head Circumference --      Peak Flow --      Pain Score 05/21/23 1511 8     Pain Loc --      Pain Edu? --      Excl. in GC? --     Most recent vital signs: Vitals:   05/21/23 1510  BP: (!) 168/109  Pulse: 96  Resp: 18  Temp: 98 F (36.7 C)  SpO2: 100%     General: Awake, no distress.  CV:  Good peripheral perfusion.  Resp:  Normal effort.  Abd:  No distention.  Other:  Mild tenderness along the left groin but no palpable hernia, no scrotal swelling or redness   ED Results / Procedures / Treatments   Labs (all labs ordered are listed, but only abnormal results are displayed) Labs Reviewed  URINALYSIS, ROUTINE W REFLEX MICROSCOPIC - Abnormal; Notable for the following components:      Result Value   Color, Urine STRAW (*)    APPearance CLEAR (*)    Hgb urine dipstick SMALL (*)    Bacteria, UA RARE (*)    All other components within normal limits  BASIC METABOLIC PANEL - Abnormal; Notable for the following components:   CO2 21 (*)    All other components within  normal limits  CBC     EKG    RADIOLOGY     PROCEDURES:  Critical Care performed:   Procedures   MEDICATIONS ORDERED IN ED: Medications  ketorolac (TORADOL) 30 MG/ML injection 30 mg (30 mg Intramuscular Given 05/21/23 1602)     IMPRESSION / MDM / ASSESSMENT AND PLAN / ED COURSE  I reviewed the triage vital signs and the nursing notes. Patient's presentation is most consistent with acute complicated illness / injury requiring diagnostic workup.  Patient presents with left groin pain and discomfort as detailed above.  No palpable hernia, no scrotal tenderness or swelling.  Pending labs urinalysis but suspect vasitis given his prior diagnosis of such.  He notes that responded well to Levaquin  Urinalysis is reassuring, patient treated with IM Toradol, will start him on Levaquin, analgesics as needed, outpatient follow-up recommended      FINAL CLINICAL IMPRESSION(S) / ED DIAGNOSES   Final diagnoses:  Vasitis     Rx / DC Orders   ED  Discharge Orders          Ordered    levofloxacin (LEVAQUIN) 500 MG tablet  Daily        05/21/23 1602    traMADol (ULTRAM) 50 MG tablet  Every 6 hours PRN        05/21/23 1602             Note:  This document was prepared using Dragon voice recognition software and may include unintentional dictation errors.   Jene Every, MD 05/21/23 [phone]

## 2023-05-21 NOTE — Telephone Encounter (Signed)
Based on symptoms, radiation, and history of back problems I'd expect his pain to be musculoskeletal in origin and not related to his prostate cancer. Will defer to PCP for management.

## 2023-05-21 NOTE — Telephone Encounter (Signed)
Patient advised. Patient states that he is not going to call PCP and waste their time with this as he will be told to call us. He said he has had pain in his back and radiating to his hip before but not into his groin and testicle area-which he did not say the first time about this location last time. He said he believes it is not back related and he will just stay home and deal with this, before I could say anything and hung up. Fyi

## 2023-05-21 NOTE — Telephone Encounter (Signed)
Patient left message on triage line stating that the groin pain is getting much worse, very intense, today has been unbearable pain and it radiates to his hip and back area. He knows that he has a surgery scheduled for later this month and but needs help in the meantime

## 2023-05-21 NOTE — ED Triage Notes (Signed)
Patient to ED via POV for groin pain and swelling. States he has prostate cancer- suppose to have prostate removed on July 29th. Pain worse over the past few days. States he has pain when urinating.

## 2023-05-21 NOTE — ED Notes (Signed)
See triage note. Intense groin pain. UA sent. EDP has seen at bedside. Pt has prostate cancer with upcoming surgery.

## 2023-05-22 ENCOUNTER — Telehealth: Payer: Self-pay

## 2023-05-22 NOTE — Telephone Encounter (Signed)
Patent left a detailed message on triage line stating he went to ER and was treated for Vasitis. Patient would like to know if he needs to follow up after ER visit and/or if this will cause a delay in his scheduled surgery for 06/18/23?

## 2023-05-23 NOTE — Telephone Encounter (Signed)
Patient advised.

## 2023-05-23 NOTE — Telephone Encounter (Signed)
He only needs to follow-up if he is not improving.  This should not delay surgery.  Vanna [place], MD

## 2023-05-29 ENCOUNTER — Telehealth: Payer: Self-pay | Admitting: Family Medicine

## 2023-05-29 NOTE — Telephone Encounter (Signed)
Patient called and states he is on his last day of ABX and was told to call and be seen if he is not getting better. He states he is not better, appointment per last note.

## 2023-05-30 ENCOUNTER — Ambulatory Visit: Payer: Medicare HMO | Admitting: Urology

## 2023-05-30 ENCOUNTER — Encounter: Payer: Self-pay | Admitting: Physician Assistant

## 2023-05-30 ENCOUNTER — Ambulatory Visit: Payer: Medicare HMO | Admitting: Physician Assistant

## 2023-05-30 ENCOUNTER — Other Ambulatory Visit: Payer: Self-pay

## 2023-05-30 VITALS — BP 153/92 | HR 77 | Wt 162.0 lb

## 2023-05-30 DIAGNOSIS — N491 Inflammatory disorders of spermatic cord, tunica vaginalis and vas deferens: Secondary | ICD-10-CM

## 2023-05-30 DIAGNOSIS — R399 Unspecified symptoms and signs involving the genitourinary system: Secondary | ICD-10-CM | POA: Diagnosis not present

## 2023-05-30 LAB — URINALYSIS, COMPLETE
Bilirubin, UA: NEGATIVE
Glucose, UA: NEGATIVE
Ketones, UA: NEGATIVE
Leukocytes,UA: NEGATIVE
Nitrite, UA: NEGATIVE
Protein,UA: NEGATIVE
RBC, UA: NEGATIVE
Specific Gravity, UA: 1.02 (ref 1.005–1.030)
Urobilinogen, Ur: 0.2 mg/dL (ref 0.2–1.0)
pH, UA: 5.5 (ref 5.0–7.5)

## 2023-05-30 LAB — MICROSCOPIC EXAMINATION: Bacteria, UA: NONE SEEN

## 2023-05-30 MED ORDER — KETOROLAC TROMETHAMINE 60 MG/2ML IM SOLN
15.0000 mg | Freq: Once | INTRAMUSCULAR | Status: AC
Start: 2023-05-30 — End: 2023-05-30
  Administered 2023-05-30: 15 mg via INTRAMUSCULAR

## 2023-05-30 MED ORDER — CELECOXIB 100 MG PO CAPS
100.0000 mg | ORAL_CAPSULE | Freq: Two times a day (BID) | ORAL | 0 refills | Status: AC
Start: 2023-05-30 — End: 2023-06-06
  Filled 2023-05-30: qty 12, 6d supply, fill #0
  Filled 2023-05-30: qty 2, 1d supply, fill #0

## 2023-05-30 NOTE — Patient Instructions (Addendum)
You received a dose of Toradol (strong anti-inflammatory) in clinic today. I'm prescribing you one week of Celebrex (another anti-inflammatory) to take by mouth. Please do not start the Celebrex until tomorrow morning to reduce your risk of GI bleed. Please make sure to stop Celebrex at least 1 week prior to your prostatectomy.  Scrotal support instructions:  Wear compressive underwear, e.g. jockstrap or snug boxer briefs, to keep the scrotum supported and promote drainage of swelling. Elevate the scrotum when at rest. Tuck a rolled washcloth or towel underneath the scrotum to lift it up and promote drainage back into your abdomen. Use ice as needed for pain relief. Never apply ice to the scrotum for longer than 20 minutes at a time and always keep a layer of fabric between the ice and the skin.

## 2023-05-30 NOTE — Progress Notes (Signed)
05/30/2023 10:11 AM   Frank Terry [birth year] [id number]  CC: Chief Complaint  Patient presents with   vasitis   HPI: Frank Terry is a 57 y.o. male with prostate cancer scheduled for radical prostatectomy with Dr. Apolinar Junes on 06/18/2023, pelvic pain, dysuria, and LUTS as well as chronic thoracic spinous compression fractures and multilevel lumbar spondylosis who presents today for evaluation of persistent pain despite antibiotic therapy for vasitis.  He called clinic 9 days ago with reports of acute worsening of groin pain radiating to the hip and back.  At that time, his symptoms were felt to be more consistent with a musculoskeletal etiology and he was recommended to follow-up with his PCP, which he declined.  He subsequently presented to the emergency department, at which point he reported dysuria and left-sided groin swelling consistent with a prior episode of vasitis.  UA was bland with no nitrites or pyuria and only rare bacteriuria.  No imaging was performed.  He was treated with IM Toradol and Levaquin 500 mg daily x 10 days.  He called clinic again yesterday to state he was on day 10 of antibiotics and his pain was no better.  Today he reports his pain has improved slightly, but not resolved.  It is exacerbated by sitting and crossing his legs.  IM Toradol previously helped, but he does have a history of bleeding ulcers about 7 years ago.  CT AP with contrast at OSH on 12/21/2021 was diagnostic for acute left vasitis.  In-office UA and microscopy today pan negative.  PMH: Past Medical History:  Diagnosis Date   Allergy    Anxiety    Arthritis    Asthma    Compression fracture of spine (HCC)    Depression    GERD (gastroesophageal reflux disease)    Gout    HTN (hypertension)    Osteoporosis    Prostate cancer (HCC)    Ulcer     Surgical History: Past Surgical History:  Procedure Laterality Date   TONSILLECTOMY      Home Medications:  Allergies as of 05/30/2023        Reactions   Penicillins         Medication List        Accurate as of May 30, 2023 10:11 AM. If you have any questions, ask your nurse or doctor.          albuterol 108 (90 Base) MCG/ACT inhaler Commonly known as: VENTOLIN HFA Inhale 2 puffs into the lungs every 6 (six) hours as needed for wheezing.   colchicine 0.6 MG tablet Take by mouth.   irbesartan 150 MG tablet Commonly known as: AVAPRO Take 1 tablet (150 mg total) by mouth daily.   levofloxacin 500 MG tablet Commonly known as: Levaquin Take 1 tablet (500 mg total) by mouth daily for 10 days.   traMADol 50 MG tablet Commonly known as: Ultram Take 1 tablet (50 mg total) by mouth every 6 (six) hours as needed.        Allergies:  Allergies  Allergen Reactions   Penicillins     Family History: Family History  Problem Relation Age of Onset   Hypertension Mother    Lung cancer Mother    Osteoporosis Mother    Alcohol abuse Mother    Arthritis Mother    Cancer Mother    Heart disease Mother    Hypertension Father    Cancer Father        Abdominal  Heart attack Father    Hypertension Sister    Multiple sclerosis Sister     Social History:   reports that he has been smoking cigarettes. He has a 30.00 pack-year smoking history. His smokeless tobacco use includes chew. He reports that he does not currently use alcohol after a past usage of about 168.0 standard drinks of alcohol per week. He reports that he does not currently use drugs.  Physical Exam: BP (!) 153/92   Pulse 77   Wt 162 lb (73.5 kg)   BMI 23.92 kg/m   Constitutional:  Alert and oriented, no acute distress, nontoxic appearing HEENT: [place], AT Cardiovascular: No clubbing, cyanosis, or edema Respiratory: Normal respiratory effort, no increased work of breathing GU: Bilateral descended testicles.  Nonenlarged, nontender epididymides.  Fullness and tenderness of the left spermatic cord without palpable fluctuance or crepitus.  No  surrounding erythema. Skin: No rashes, bruises or suspicious lesions Neurologic: Grossly intact, no focal deficits, moving all 4 extremities Psychiatric: Normal mood and affect  Laboratory Data: Results for orders placed or performed in visit on 05/30/23  Microscopic Examination   Urine  Result Value Ref Range   WBC, UA 0-5 0 - 5 /hpf   RBC, Urine 0-2 0 - 2 /hpf   Epithelial Cells (non renal) 0-10 0 - 10 /hpf   Bacteria, UA None seen None seen/Few  Urinalysis, Complete  Result Value Ref Range   Specific Gravity, UA 1.020 1.005 - 1.030   pH, UA 5.5 5.0 - 7.5   Color, UA Yellow Yellow   Appearance Ur Clear Clear   Leukocytes,UA Negative Negative   Protein,UA Negative Negative/Trace   Glucose, UA Negative Negative   Ketones, UA Negative Negative   RBC, UA Negative Negative   Bilirubin, UA Negative Negative   Urobilinogen, Ur 0.2 0.2 - 1.0 mg/dL   Nitrite, UA Negative Negative   Microscopic Examination See below:    Assessment & Plan:   1. Vasitis Symptoms improved after empiric antibiotics.  I suspect he is having postinfectious inflammation and recommended an anti-inflammatory approach.  With his history of GI bleed, I offered him low-dose Toradol today and we will give him 7 days of Celebrex.  We also discussed scrotal support including scrotal elevation, compressive underwear, and cryotherapy.  We discussed stopping NSAIDs at least 7 days prior to upcoming prostatectomy.  We discussed return precautions including fever and sudden worsening in pain. - Urinalysis, Complete - ketorolac (TORADOL) injection 15 mg - celecoxib (CELEBREX) 100 MG capsule; Take 1 capsule (100 mg total) by mouth 2 (two) times daily for 7 days.  Dispense: 14 capsule; Refill: 0   Return if symptoms worsen or fail to improve.  Carman Ching, PA-C  [facility] [place] [address] [place], [place] [address] [phone]

## 2023-06-11 ENCOUNTER — Encounter
Admission: RE | Admit: 2023-06-11 | Discharge: 2023-06-11 | Disposition: A | Discharge status: Home / Self Care | Payer: Medicare HMO | Source: Ambulatory Visit | Attending: Urology | Admitting: Urology

## 2023-06-11 HISTORY — DX: Renal agenesis, unilateral: Q60.0

## 2023-06-11 HISTORY — DX: Alcohol use, unspecified, uncomplicated: F10.90

## 2023-06-11 HISTORY — DX: Hypo-osmolality and hyponatremia: E87.1

## 2023-06-11 HISTORY — DX: Gastric ulcer, unspecified as acute or chronic, without hemorrhage or perforation: K25.9

## 2023-06-11 NOTE — Patient Instructions (Signed)
Your procedure is scheduled on:06-18-23 Monday Report to the Registration Desk on the 1st floor of the [facility].Then proceed to the 2nd floor Surgery Desk To find out your arrival time, please call [phone] between 1PM - 3PM on:06-15-23 Friday If your arrival time is 6:00 am, do not arrive before that time as the [facility] entrance doors do not open until 6:00 am.  REMEMBER: Instructions that are not followed completely may result in serious medical risk, up to and including death; or upon the discretion of your surgeon and anesthesiologist your surgery may need to be rescheduled.  Do not eat food OR drink any liquids after midnight the night before surgery.  No gum chewing or hard candies.  Clear liquids only the day prior to your surgery as instructed by Dr Delana Meyer office.  One week prior to surgery: Stop Anti-inflammatories (NSAIDS) such as Advil, Aleve, Ibuprofen, Motrin, Naproxen, Naprosyn and Aspirin based products such as Excedrin, Goody's Powder, BC Powder.You may however, continue to take Tylenol if needed for pain up until the day of surgery. Stop ANY OVER THE COUNTER supplements/vitamins NOW (06-11-23) until after surgery.  Continue taking all prescribed medications  Do NOT take any medication the day of surgery  Use your Albuterol Inhaler the day of surgery and bring your Inhaler to the hospital  No Alcohol for 24 hours before or after surgery.  No Smoking including e-cigarettes for 24 hours before surgery.  No chewable tobacco products for at least 6 hours before surgery.  No nicotine patches on the day of surgery.  Do not use any "recreational" drugs for at least a week (preferably 2 weeks) before your surgery.  Please be advised that the combination of cocaine and anesthesia may have negative outcomes, up to and including death. If you test positive for cocaine, your surgery will be cancelled.  On the morning of surgery brush your teeth with toothpaste and  water, you may rinse your mouth with mouthwash if you wish. Do not swallow any toothpaste or mouthwash.  Use CHG Soap as directed on instruction sheet.  Do not wear jewelry, make-up, hairpins, clips or nail polish.  Do not wear lotions, powders, or perfumes.   Do not shave body hair from the neck down 48 hours before surgery.  Contact lenses, hearing aids and dentures may not be worn into surgery.  Do not bring valuables to the hospital. [facility] is not responsible for any missing/lost belongings or valuables.   Notify your doctor if there is any change in your medical condition (cold, fever, infection).  Wear comfortable clothing (specific to your surgery type) to the hospital.  After surgery, you can help prevent lung complications by doing breathing exercises.  Take deep breaths and cough every 1-2 hours. Your doctor may order a device called an Incentive Spirometer to help you take deep breaths. When coughing or sneezing, hold a pillow firmly against your incision with both hands. This is called "splinting." Doing this helps protect your incision. It also decreases belly discomfort.  If you are being admitted to the hospital overnight, leave your suitcase in the car. After surgery it may be brought to your room.  In case of increased patient census, it may be necessary for you, the patient, to continue your postoperative care in the Same Day Surgery department.  If you are being discharged the day of surgery, you will not be allowed to drive home. You will need a responsible individual to drive you home and stay  with you for 24 hours after surgery.   If you are taking public transportation, you will need to have a responsible individual with you.  Please call the Pre-admissions Testing Dept. at [phone] if you have any questions about these instructions.  Surgery Visitation Policy:  Patients having surgery or a procedure may have two visitors.  Children under the age  of 29 must have an adult with them who is not the patient.  Inpatient Visitation:    Visiting hours are 7 a.m. to 8 p.m. Up to four visitors are allowed at one time in a patient room. The visitors may rotate out with other people during the day.  One visitor age 65 or older may stay with the patient overnight and must be in the room by 8 p.m.     Preparing for Surgery with CHLORHEXIDINE GLUCONATE (CHG) Soap  Chlorhexidine Gluconate (CHG) Soap  o An antiseptic cleaner that kills germs and bonds with the skin to continue killing germs even after washing  o Used for showering the night before surgery and morning of surgery  Before surgery, you can play an important role by reducing the number of germs on your skin.  CHG (Chlorhexidine gluconate) soap is an antiseptic cleanser which kills germs and bonds with the skin to continue killing germs even after washing.  Please do not use if you have an allergy to CHG or antibacterial soaps. If your skin becomes reddened/irritated stop using the CHG.  1. Shower the NIGHT BEFORE SURGERY and the MORNING OF SURGERY with CHG soap.  2. If you choose to wash your hair, wash your hair first as usual with your normal shampoo.  3. After shampooing, rinse your hair and body thoroughly to remove the shampoo.  4. Use CHG as you would any other liquid soap. You can apply CHG directly to the skin and wash gently with a scrungie or a clean washcloth.  5. Apply the CHG soap to your body only from the neck down. Do not use on open wounds or open sores. Avoid contact with your eyes, ears, mouth, and genitals (private parts). Wash face and genitals (private parts) with your normal soap.  6. Wash thoroughly, paying special attention to the area where your surgery will be performed.  7. Thoroughly rinse your body with warm water.  8. Do not shower/wash with your normal soap after using and rinsing off the CHG soap.  9. Pat yourself dry with a clean  towel.  10. Wear clean pajamas to bed the night before surgery.  12. Place clean sheets on your bed the night of your first shower and do not sleep with pets.  13. Shower again with the CHG soap on the day of surgery prior to arriving at the hospital.  14. Do not apply any deodorants/lotions/powders.  15. Please wear clean clothes to the hospital.

## 2023-06-13 ENCOUNTER — Encounter
Admission: RE | Admit: 2023-06-13 | Discharge: 2023-06-13 | Disposition: A | Discharge status: Home / Self Care | Payer: Medicare HMO | Source: Ambulatory Visit | Attending: Urology | Admitting: Urology

## 2023-06-13 DIAGNOSIS — R300 Dysuria: Secondary | ICD-10-CM | POA: Diagnosis not present

## 2023-06-13 DIAGNOSIS — Z01818 Encounter for other preprocedural examination: Secondary | ICD-10-CM | POA: Diagnosis not present

## 2023-06-13 DIAGNOSIS — R102 Pelvic and perineal pain: Secondary | ICD-10-CM | POA: Diagnosis not present

## 2023-06-13 DIAGNOSIS — C61 Malignant neoplasm of prostate: Secondary | ICD-10-CM | POA: Diagnosis not present

## 2023-06-13 LAB — PROTIME-INR
INR: 1 (ref 0.8–1.2)
Prothrombin Time: 12.9 seconds (ref 11.4–15.2)

## 2023-06-13 LAB — URINALYSIS, COMPLETE (UACMP) WITH MICROSCOPIC
Bacteria, UA: NONE SEEN
Bilirubin Urine: NEGATIVE
Glucose, UA: NEGATIVE mg/dL
Hgb urine dipstick: NEGATIVE
Ketones, ur: NEGATIVE mg/dL
Leukocytes,Ua: NEGATIVE
Nitrite: NEGATIVE
Protein, ur: NEGATIVE mg/dL
Specific Gravity, Urine: 1.011 (ref 1.005–1.030)
Squamous Epithelial / HPF: NONE SEEN /HPF (ref 0–5)
pH: 6 (ref 5.0–8.0)

## 2023-06-13 LAB — TYPE AND SCREEN
ABO/RH(D): O POS
Antibody Screen: NEGATIVE

## 2023-06-14 LAB — URINE CULTURE: Culture: NO GROWTH

## 2023-06-18 ENCOUNTER — Ambulatory Visit: Payer: Medicare HMO | Admitting: Certified Registered Nurse Anesthetist

## 2023-06-18 ENCOUNTER — Encounter
Admission: RE | Disposition: A | Discharge status: Home / Self Care | Payer: Self-pay | Source: Home / Self Care | Attending: Urology

## 2023-06-18 ENCOUNTER — Other Ambulatory Visit: Payer: Self-pay

## 2023-06-18 ENCOUNTER — Encounter: Payer: Self-pay | Admitting: Urology

## 2023-06-18 ENCOUNTER — Ambulatory Visit: Payer: Medicare HMO | Admitting: Urgent Care

## 2023-06-18 ENCOUNTER — Observation Stay
Admission: RE | Admit: 2023-06-18 | Discharge: 2023-06-19 | Disposition: A | Discharge status: Home / Self Care | Payer: Medicare HMO | Attending: Urology | Admitting: Urology

## 2023-06-18 DIAGNOSIS — C61 Malignant neoplasm of prostate: Secondary | ICD-10-CM | POA: Diagnosis not present

## 2023-06-18 DIAGNOSIS — Z87891 Personal history of nicotine dependence: Secondary | ICD-10-CM | POA: Diagnosis not present

## 2023-06-18 DIAGNOSIS — J45909 Unspecified asthma, uncomplicated: Secondary | ICD-10-CM | POA: Insufficient documentation

## 2023-06-18 DIAGNOSIS — F17210 Nicotine dependence, cigarettes, uncomplicated: Secondary | ICD-10-CM | POA: Insufficient documentation

## 2023-06-18 DIAGNOSIS — C775 Secondary and unspecified malignant neoplasm of intrapelvic lymph nodes: Secondary | ICD-10-CM | POA: Diagnosis not present

## 2023-06-18 DIAGNOSIS — I10 Essential (primary) hypertension: Secondary | ICD-10-CM | POA: Insufficient documentation

## 2023-06-18 DIAGNOSIS — C7982 Secondary malignant neoplasm of genital organs: Secondary | ICD-10-CM | POA: Diagnosis not present

## 2023-06-18 HISTORY — PX: ROBOT ASSISTED LAPAROSCOPIC RADICAL PROSTATECTOMY: SHX5141

## 2023-06-18 HISTORY — PX: PELVIC LYMPH NODE DISSECTION: SHX6543

## 2023-06-18 SURGERY — PROSTATECTOMY, RADICAL, ROBOT-ASSISTED, LAPAROSCOPIC
Anesthesia: General | Site: Abdomen

## 2023-06-18 MED ORDER — ACETAMINOPHEN 10 MG/ML IV SOLN
INTRAVENOUS | Status: DC | PRN
  Administered 2023-06-18: 1000 mg via INTRAVENOUS

## 2023-06-18 MED ORDER — LACTATED RINGERS IV SOLN: INTRAVENOUS | Status: DC | PRN

## 2023-06-18 MED ORDER — CHLORHEXIDINE GLUCONATE 4 % EX SOLN: 60.0000 mL | Freq: Once | CUTANEOUS | Status: DC

## 2023-06-18 MED ORDER — FENTANYL CITRATE (PF) 100 MCG/2ML IJ SOLN
50.0000 ug | Freq: Once | INTRAMUSCULAR | Status: AC
  Administered 2023-06-18: 50 ug via INTRAVENOUS

## 2023-06-18 MED ORDER — MIDAZOLAM HCL 2 MG/2ML IJ SOLN
INTRAMUSCULAR | Status: DC | PRN
  Administered 2023-06-18: 2 mg via INTRAVENOUS

## 2023-06-18 MED ORDER — MORPHINE SULFATE (PF) 2 MG/ML IV SOLN
2.0000 mg | INTRAVENOUS | Status: DC | PRN
  Administered 2023-06-18: 4 mg via INTRAVENOUS
  Administered 2023-06-18: 2 mg via INTRAVENOUS
  Administered 2023-06-19: 4 mg via INTRAVENOUS
  Filled 2023-06-18: qty 1
  Filled 2023-06-18 (×2): qty 2

## 2023-06-18 MED ORDER — LACTATED RINGERS IV SOLN: INTRAVENOUS | Status: DC

## 2023-06-18 MED ORDER — DEXMEDETOMIDINE HCL IN NACL 80 MCG/20ML IV SOLN
INTRAVENOUS | Status: DC | PRN
  Administered 2023-06-18 (×2): 8 ug via INTRAVENOUS
  Administered 2023-06-18: 12 ug via INTRAVENOUS

## 2023-06-18 MED ORDER — ACETAMINOPHEN 10 MG/ML IV SOLN
INTRAVENOUS | Status: AC
  Filled 2023-06-18: qty 100

## 2023-06-18 MED ORDER — DEXAMETHASONE SODIUM PHOSPHATE 10 MG/ML IJ SOLN
INTRAMUSCULAR | Status: DC | PRN
  Administered 2023-06-18: 10 mg via INTRAVENOUS

## 2023-06-18 MED ORDER — DEXMEDETOMIDINE HCL IN NACL 80 MCG/20ML IV SOLN
INTRAVENOUS | Status: AC
  Filled 2023-06-18: qty 20

## 2023-06-18 MED ORDER — OXYCODONE HCL 5 MG PO TABS
ORAL_TABLET | ORAL | Status: AC
  Filled 2023-06-18: qty 1

## 2023-06-18 MED ORDER — OXYCODONE HCL 5 MG/5ML PO SOLN: 5.0000 mg | Freq: Once | ORAL | Status: AC | PRN

## 2023-06-18 MED ORDER — GLYCOPYRROLATE 0.2 MG/ML IJ SOLN
INTRAMUSCULAR | Status: DC | PRN
  Administered 2023-06-18: .2 mg via INTRAVENOUS

## 2023-06-18 MED ORDER — SURGIFLO WITH THROMBIN (HEMOSTATIC MATRIX KIT) OPTIME
TOPICAL | Status: DC | PRN
  Administered 2023-06-18: 1 via TOPICAL

## 2023-06-18 MED ORDER — SUGAMMADEX SODIUM 200 MG/2ML IV SOLN
INTRAVENOUS | Status: DC | PRN
  Administered 2023-06-18: 100 mg via INTRAVENOUS
  Administered 2023-06-18: 50 mg via INTRAVENOUS

## 2023-06-18 MED ORDER — ONDANSETRON HCL 4 MG/2ML IJ SOLN: 4.0000 mg | Freq: Once | INTRAMUSCULAR | Status: DC | PRN

## 2023-06-18 MED ORDER — CEFAZOLIN SODIUM-DEXTROSE 2-4 GM/100ML-% IV SOLN
INTRAVENOUS | Status: AC
  Filled 2023-06-18: qty 100

## 2023-06-18 MED ORDER — FENTANYL CITRATE (PF) 100 MCG/2ML IJ SOLN
25.0000 ug | INTRAMUSCULAR | Status: DC | PRN
  Administered 2023-06-18 (×4): 25 ug via INTRAVENOUS
  Administered 2023-06-18: 50 ug via INTRAVENOUS

## 2023-06-18 MED ORDER — DIPHENHYDRAMINE HCL 12.5 MG/5ML PO ELIX: 12.5000 mg | ORAL_SOLUTION | Freq: Four times a day (QID) | ORAL | Status: DC | PRN

## 2023-06-18 MED ORDER — FENTANYL CITRATE (PF) 100 MCG/2ML IJ SOLN
INTRAMUSCULAR | Status: AC
  Filled 2023-06-18: qty 2

## 2023-06-18 MED ORDER — ALBUTEROL SULFATE (2.5 MG/3ML) 0.083% IN NEBU: 2.5000 mg | INHALATION_SOLUTION | Freq: Four times a day (QID) | RESPIRATORY_TRACT | Status: DC | PRN

## 2023-06-18 MED ORDER — PROPOFOL 10 MG/ML IV BOLUS
INTRAVENOUS | Status: AC
  Filled 2023-06-18: qty 40

## 2023-06-18 MED ORDER — MIDAZOLAM HCL 2 MG/2ML IJ SOLN
INTRAMUSCULAR | Status: AC
  Filled 2023-06-18: qty 2

## 2023-06-18 MED ORDER — FAMOTIDINE 20 MG PO TABS
ORAL_TABLET | ORAL | Status: AC
  Filled 2023-06-18: qty 1

## 2023-06-18 MED ORDER — BUPIVACAINE HCL (PF) 0.5 % IJ SOLN
INTRAMUSCULAR | Status: AC
  Filled 2023-06-18: qty 30

## 2023-06-18 MED ORDER — ONDANSETRON HCL 4 MG/2ML IJ SOLN
4.0000 mg | INTRAMUSCULAR | Status: DC | PRN
  Administered 2023-06-18: 4 mg via INTRAVENOUS
  Filled 2023-06-18: qty 2

## 2023-06-18 MED ORDER — PHENYLEPHRINE HCL (PRESSORS) 10 MG/ML IV SOLN
INTRAVENOUS | Status: DC | PRN
  Administered 2023-06-18 (×2): 80 ug via INTRAVENOUS

## 2023-06-18 MED ORDER — ACETAMINOPHEN 10 MG/ML IV SOLN: 1000.0000 mg | Freq: Once | INTRAVENOUS | Status: DC | PRN

## 2023-06-18 MED ORDER — STERILE WATER FOR IRRIGATION IR SOLN
Status: DC | PRN
  Administered 2023-06-18: 1000 mL

## 2023-06-18 MED ORDER — OXYBUTYNIN CHLORIDE 5 MG PO TABS
ORAL_TABLET | ORAL | Status: AC
  Filled 2023-06-18: qty 1

## 2023-06-18 MED ORDER — CHLORHEXIDINE GLUCONATE 0.12 % MT SOLN
15.0000 mL | Freq: Once | OROMUCOSAL | Status: AC
  Administered 2023-06-18: 15 mL via OROMUCOSAL

## 2023-06-18 MED ORDER — DIPHENHYDRAMINE HCL 50 MG/ML IJ SOLN: 12.5000 mg | Freq: Four times a day (QID) | INTRAMUSCULAR | Status: DC | PRN

## 2023-06-18 MED ORDER — LIDOCAINE HCL (CARDIAC) PF 100 MG/5ML IV SOSY
PREFILLED_SYRINGE | INTRAVENOUS | Status: DC | PRN
  Administered 2023-06-18: 80 mg via INTRAVENOUS

## 2023-06-18 MED ORDER — OXYCODONE-ACETAMINOPHEN 5-325 MG PO TABS
1.0000 | ORAL_TABLET | ORAL | Status: DC | PRN
  Administered 2023-06-18: 1 via ORAL
  Administered 2023-06-18 – 2023-06-19 (×4): 2 via ORAL
  Filled 2023-06-18 (×2): qty 2
  Filled 2023-06-18: qty 1
  Filled 2023-06-18 (×2): qty 2

## 2023-06-18 MED ORDER — FAMOTIDINE 20 MG PO TABS
20.0000 mg | ORAL_TABLET | Freq: Once | ORAL | Status: AC
  Administered 2023-06-18: 20 mg via ORAL

## 2023-06-18 MED ORDER — DOCUSATE SODIUM 100 MG PO CAPS
100.0000 mg | ORAL_CAPSULE | Freq: Two times a day (BID) | ORAL | Status: DC
  Administered 2023-06-18 – 2023-06-19 (×2): 100 mg via ORAL
  Filled 2023-06-18 (×2): qty 1

## 2023-06-18 MED ORDER — STERILE WATER FOR IRRIGATION IR SOLN
Status: DC | PRN
  Administered 2023-06-18: 1

## 2023-06-18 MED ORDER — SEVOFLURANE IN SOLN
RESPIRATORY_TRACT | Status: AC
  Filled 2023-06-18: qty 250

## 2023-06-18 MED ORDER — SODIUM CHLORIDE 0.9 % IV SOLN: INTRAVENOUS | Status: DC

## 2023-06-18 MED ORDER — IRBESARTAN 150 MG PO TABS
150.0000 mg | ORAL_TABLET | Freq: Every day | ORAL | Status: DC
  Administered 2023-06-19: 150 mg via ORAL
  Filled 2023-06-18: qty 1

## 2023-06-18 MED ORDER — FENTANYL CITRATE (PF) 100 MCG/2ML IJ SOLN
INTRAMUSCULAR | Status: DC | PRN
  Administered 2023-06-18 (×2): 25 ug via INTRAVENOUS
  Administered 2023-06-18: 50 ug via INTRAVENOUS

## 2023-06-18 MED ORDER — NICOTINE POLACRILEX 2 MG MT GUM: 4.0000 mg | CHEWING_GUM | OROMUCOSAL | Status: DC | PRN

## 2023-06-18 MED ORDER — OXYCODONE HCL 5 MG PO TABS
5.0000 mg | ORAL_TABLET | Freq: Once | ORAL | Status: AC | PRN
  Administered 2023-06-18: 5 mg via ORAL

## 2023-06-18 MED ORDER — CHLORHEXIDINE GLUCONATE 0.12 % MT SOLN
OROMUCOSAL | Status: AC
  Filled 2023-06-18: qty 15

## 2023-06-18 MED ORDER — ORAL CARE MOUTH RINSE: 15.0000 mL | Freq: Once | OROMUCOSAL | Status: AC

## 2023-06-18 MED ORDER — ROCURONIUM BROMIDE 100 MG/10ML IV SOLN
INTRAVENOUS | Status: DC | PRN
  Administered 2023-06-18 (×2): 10 mg via INTRAVENOUS
  Administered 2023-06-18 (×2): 20 mg via INTRAVENOUS
  Administered 2023-06-18: 40 mg via INTRAVENOUS

## 2023-06-18 MED ORDER — ONDANSETRON HCL 4 MG/2ML IJ SOLN
INTRAMUSCULAR | Status: DC | PRN
  Administered 2023-06-18: 4 mg via INTRAVENOUS

## 2023-06-18 MED ORDER — ACETAMINOPHEN 325 MG PO TABS: 650.0000 mg | ORAL_TABLET | ORAL | Status: DC | PRN

## 2023-06-18 MED ORDER — CEFAZOLIN SODIUM-DEXTROSE 2-4 GM/100ML-% IV SOLN
2.0000 g | INTRAVENOUS | Status: AC
  Administered 2023-06-18: 2 g via INTRAVENOUS

## 2023-06-18 MED ORDER — CHLORHEXIDINE GLUCONATE 4 % EX SOLN
60.0000 mL | Freq: Once | CUTANEOUS | Status: AC
  Administered 2023-06-18: 4 via TOPICAL

## 2023-06-18 MED ORDER — HEPARIN SODIUM (PORCINE) 5000 UNIT/ML IJ SOLN
5000.0000 [IU] | Freq: Three times a day (TID) | INTRAMUSCULAR | Status: DC
  Administered 2023-06-18 – 2023-06-19 (×3): 5000 [IU] via SUBCUTANEOUS
  Filled 2023-06-18 (×4): qty 1

## 2023-06-18 MED ORDER — PROPOFOL 10 MG/ML IV BOLUS
INTRAVENOUS | Status: DC | PRN
  Administered 2023-06-18: 50 mg via INTRAVENOUS
  Administered 2023-06-18: 100 mg via INTRAVENOUS
  Administered 2023-06-18: 50 mg via INTRAVENOUS
  Administered 2023-06-18: 10 ug/kg/min via INTRAVENOUS

## 2023-06-18 MED ORDER — ZOLPIDEM TARTRATE 5 MG PO TABS: 5.0000 mg | ORAL_TABLET | Freq: Every evening | ORAL | Status: DC | PRN

## 2023-06-18 MED ORDER — NICOTINE 14 MG/24HR TD PT24
14.0000 mg | MEDICATED_PATCH | Freq: Every day | TRANSDERMAL | Status: DC
  Filled 2023-06-18: qty 1

## 2023-06-18 MED ORDER — ALBUTEROL SULFATE HFA 108 (90 BASE) MCG/ACT IN AERS: 2.0000 | INHALATION_SPRAY | Freq: Four times a day (QID) | RESPIRATORY_TRACT | Status: DC | PRN

## 2023-06-18 MED ORDER — CEFAZOLIN SODIUM-DEXTROSE 1-4 GM/50ML-% IV SOLN
1.0000 g | Freq: Three times a day (TID) | INTRAVENOUS | Status: AC
  Administered 2023-06-18 (×2): 1 g via INTRAVENOUS
  Filled 2023-06-18 (×2): qty 50

## 2023-06-18 MED ORDER — BUPIVACAINE HCL 0.5 % IJ SOLN
INTRAMUSCULAR | Status: DC | PRN
  Administered 2023-06-18: 30 mL

## 2023-06-18 MED ORDER — COLCHICINE 0.6 MG PO TABS: 0.6000 mg | ORAL_TABLET | ORAL | Status: DC | PRN

## 2023-06-18 MED ORDER — OXYBUTYNIN CHLORIDE 5 MG PO TABS
5.0000 mg | ORAL_TABLET | Freq: Three times a day (TID) | ORAL | Status: DC | PRN
  Administered 2023-06-18: 5 mg via ORAL

## 2023-06-18 SURGICAL SUPPLY — 95 items
ADH SKN CLS APL DERMABOND .7 (GAUZE/BANDAGES/DRESSINGS) ×2
AGENT HMST KT MTR STRL THRMB (HEMOSTASIS) ×2
ANCHOR TIS RET SYS 235ML (MISCELLANEOUS) ×2 IMPLANT
APL ESCP 34 STRL LF DISP (HEMOSTASIS) ×2
APPLICATOR SURGIFLO ENDO (HEMOSTASIS) ×2 IMPLANT
BAG DRN RND TRDRP ANRFLXCHMBR (UROLOGICAL SUPPLIES) ×2
BAG TISS RTRVL C235 10X14 (MISCELLANEOUS) ×2
BAG URINE DRAIN 2000ML AR STRL (UROLOGICAL SUPPLIES) ×2 IMPLANT
BLADE CLIPPER SURG (BLADE) ×2 IMPLANT
BULB RESERV EVAC DRAIN JP 100C (MISCELLANEOUS) IMPLANT
CATH DRAINAGE MALECOT 26FR (CATHETERS) IMPLANT
CATH FOL 2WAY LX 18X5 (CATHETERS) ×4 IMPLANT
CATH MALECOT (CATHETERS) ×2
CLIP LIGATING HEM O LOK PURPLE (MISCELLANEOUS) ×8 IMPLANT
COVER TIP SHEARS 8 DVNC (MISCELLANEOUS) ×2 IMPLANT
DEFOGGER SCOPE WARMER CLEARIFY (MISCELLANEOUS) ×2 IMPLANT
DERMABOND ADVANCED .7 DNX12 (GAUZE/BANDAGES/DRESSINGS) ×2 IMPLANT
DRAIN CHANNEL JP 15F RND 16 (MISCELLANEOUS) IMPLANT
DRAIN CHANNEL JP 19F (MISCELLANEOUS) IMPLANT
DRAPE 3/4 80X56 (DRAPES) ×2 IMPLANT
DRAPE ARM DVNC X/XI (DISPOSABLE) ×8 IMPLANT
DRAPE COLUMN DVNC XI (DISPOSABLE) ×2 IMPLANT
DRAPE LEGGINS SURG 28X43 STRL (DRAPES) ×2 IMPLANT
DRAPE SURG 17X11 SM STRL (DRAPES) ×8 IMPLANT
DRAPE UNDER BUTTOCK W/FLU (DRAPES) ×2 IMPLANT
DRIVER NDL LRG 8 DVNC XI (INSTRUMENTS) ×2 IMPLANT
DRIVER NDLE LRG 8 DVNC XI (INSTRUMENTS) ×2 IMPLANT
DRSG TELFA 3X8 NADH STRL (GAUZE/BANDAGES/DRESSINGS) ×2 IMPLANT
ELECT REM PT RETURN 9FT ADLT (ELECTROSURGICAL) ×2
ELECTRODE REM PT RTRN 9FT ADLT (ELECTROSURGICAL) ×2 IMPLANT
FORCEPS BPLR 8 MD DVNC XI (FORCEP) ×2 IMPLANT
FORCEPS BPLR FENES DVNC XI (FORCEP) ×2 IMPLANT
FORCEPS PROGRASP DVNC XI (FORCEP) ×2 IMPLANT
GLOVE BIO SURGEON STRL SZ 6.5 (GLOVE) ×4 IMPLANT
GLOVE BIOGEL PI IND STRL 7.5 (GLOVE) ×2 IMPLANT
GOWN STRL REUS W/ TWL LRG LVL3 (GOWN DISPOSABLE) ×4 IMPLANT
GOWN STRL REUS W/ TWL XL LVL3 (GOWN DISPOSABLE) ×2 IMPLANT
GOWN STRL REUS W/TWL LRG LVL3 (GOWN DISPOSABLE) ×4
GOWN STRL REUS W/TWL XL LVL3 (GOWN DISPOSABLE) ×2
GRASPER SUT TROCAR 14GX15 (MISCELLANEOUS) ×2 IMPLANT
HEMOSTAT SURGICEL 2X14 (HEMOSTASIS) ×2 IMPLANT
HOLDER FOLEY CATH W/STRAP (MISCELLANEOUS) ×2 IMPLANT
IRRIGATION STRYKERFLOW (MISCELLANEOUS) ×2 IMPLANT
IRRIGATOR STRYKERFLOW (MISCELLANEOUS) ×2
KIT PINK PAD W/HEAD ARE REST (MISCELLANEOUS) ×2
KIT PINK PAD W/HEAD ARM REST (MISCELLANEOUS) ×2 IMPLANT
LABEL OR SOLS (LABEL) ×2 IMPLANT
MANIFOLD NEPTUNE II (INSTRUMENTS) ×2 IMPLANT
MARKER SKIN DUAL TIP RULER LAB (MISCELLANEOUS) ×2 IMPLANT
NDL HYPO 22X1.5 SAFETY MO (MISCELLANEOUS) ×2 IMPLANT
NDL INSUFFLATION 14GA 120MM (NEEDLE) ×2 IMPLANT
NEEDLE HYPO 22X1.5 SAFETY MO (MISCELLANEOUS) ×2 IMPLANT
NEEDLE INSUFFLATION 14GA 120MM (NEEDLE) ×2 IMPLANT
OBTURATOR OPTICAL STND 8 DVNC (TROCAR) ×2
OBTURATOR OPTICALSTD 8 DVNC (TROCAR) ×2 IMPLANT
PACK LAP CHOLECYSTECTOMY (MISCELLANEOUS) ×2 IMPLANT
PENCIL SMOKE EVACUATOR (MISCELLANEOUS) ×2 IMPLANT
RELOAD STAPLE 60 2.6 WHT THN (STAPLE) IMPLANT
RELOAD STAPLER WHITE 60MM (STAPLE) ×2 IMPLANT
SCISSORS MNPLR CVD DVNC XI (INSTRUMENTS) ×2 IMPLANT
SEAL UNIV 5-12 XI (MISCELLANEOUS) ×8 IMPLANT
SET TUBE SMOKE EVAC HIGH FLOW (TUBING) ×2 IMPLANT
SOL ELECTROSURG ANTI STICK (MISCELLANEOUS) ×2
SOLUTION ELECTROSURG ANTI STCK (MISCELLANEOUS) ×2 IMPLANT
SPONGE T-LAP 4X18 ~~LOC~~+RFID (SPONGE) ×2 IMPLANT
SPONGE VERSALON 4X4 4PLY (MISCELLANEOUS) IMPLANT
STAPLE ECHEON FLEX 60 POW ENDO (STAPLE) IMPLANT
STAPLER RELOAD WHITE 60MM (STAPLE) ×2
STAPLER SKIN PROX 35W (STAPLE) ×2 IMPLANT
STRAP SAFETY 5IN WIDE (MISCELLANEOUS) ×4 IMPLANT
SURGIFLO W/THROMBIN 8M KIT (HEMOSTASIS) ×2 IMPLANT
SURGILUBE 2OZ TUBE FLIPTOP (MISCELLANEOUS) ×2 IMPLANT
SUT DVC VLOC 90 3-0 CV23 UNDY (SUTURE) ×4 IMPLANT
SUT DVC VLOC 90 3-0 CV23 VLT (SUTURE) ×2
SUT ETHILON 3-0 FS-10 30 BLK (SUTURE)
SUT MNCRL 4-0 (SUTURE) ×4
SUT MNCRL 4-0 27XMFL (SUTURE) ×4
SUT SILK 2 0 SH (SUTURE) IMPLANT
SUT VIC AB 0 CT1 36 (SUTURE) ×4 IMPLANT
SUT VIC AB 2-0 SH 27 (SUTURE)
SUT VIC AB 2-0 SH 27XBRD (SUTURE) IMPLANT
SUT VICRYL 0 UR6 27IN ABS (SUTURE) ×2 IMPLANT
SUTURE DVC VLC 90 3-0 CV23 VLT (SUTURE) ×2 IMPLANT
SUTURE EHLN 3-0 FS-10 30 BLK (SUTURE) IMPLANT
SUTURE MNCRL 4-0 27XMF (SUTURE) ×4 IMPLANT
SYR 10ML LL (SYRINGE) ×2 IMPLANT
SYR TOOMEY 50ML (SYRINGE) ×4 IMPLANT
SYR TOOMEY IRRIG 70ML (MISCELLANEOUS) ×2
SYRINGE TOOMEY IRRIG 70ML (MISCELLANEOUS) IMPLANT
TAPE CLOTH 3X10 WHT NS LF (GAUZE/BANDAGES/DRESSINGS) ×4 IMPLANT
TRAP FLUID SMOKE EVACUATOR (MISCELLANEOUS) ×2 IMPLANT
TROCAR Z-THREAD FIOS 12X100MM (TROCAR) ×2 IMPLANT
TROCAR Z-THREAD FIOS 5X100MM (TROCAR) ×2 IMPLANT
WATER STERILE IRR 3000ML UROMA (IV SOLUTION) ×2 IMPLANT
WATER STERILE IRR 500ML POUR (IV SOLUTION) ×2 IMPLANT

## 2023-06-18 NOTE — Transfer of Care (Signed)
Immediate Anesthesia Transfer of Care Note  Patient: Frank Terry  Procedure(s) Performed: XI ROBOTIC ASSISTED LAPAROSCOPIC RADICAL PROSTATECTOMY (Abdomen) PELVIC LYMPH NODE DISSECTION (Bilateral: Abdomen)  Patient Location: PACU  Anesthesia Type:General  Level of Consciousness: awake, alert , and oriented  Airway & Oxygen Therapy: Patient Spontanous Breathing  Post-op Assessment: Report given to RN and Post -op Vital signs reviewed and stable  Post vital signs: Reviewed and stable  Last Vitals:  Vitals Value Taken Time  BP    Temp    Pulse    Resp    SpO2      Last Pain:  Vitals:   06/18/23 0617  TempSrc: Temporal         Complications: No notable events documented.

## 2023-06-18 NOTE — Plan of Care (Signed)
  Problem: Education: Goal: Knowledge of the procedure and recovery process will improve Outcome: Progressing   Problem: Bowel/Gastric: Goal: Gastrointestinal status for postoperative course will improve Outcome: Progressing   Problem: Pain Management: Goal: General experience of comfort will improve Outcome: Progressing   Problem: Skin Integrity: Goal: Demonstration of wound healing without infection will improve Outcome: Progressing   Problem: Urinary Elimination: Goal: Ability to avoid or minimize complications of infection will improve Outcome: Progressing Goal: Ability to achieve and maintain urine output will improve Outcome: Progressing Goal: Home care management will improve Outcome: Progressing   Problem: Education: Goal: Knowledge of General Education information will improve Description: Including pain rating scale, medication(s)/side effects and non-pharmacologic comfort measures Outcome: Progressing   Problem: Health Behavior/Discharge Planning: Goal: Ability to manage health-related needs will improve Outcome: Progressing   Problem: Clinical Measurements: Goal: Ability to maintain clinical measurements within normal limits will improve Outcome: Progressing Goal: Will remain free from infection Outcome: Progressing Goal: Diagnostic test results will improve Outcome: Progressing Goal: Respiratory complications will improve Outcome: Progressing Goal: Cardiovascular complication will be avoided Outcome: Progressing   Problem: Activity: Goal: Risk for activity intolerance will decrease Outcome: Progressing   Problem: Nutrition: Goal: Adequate nutrition will be maintained Outcome: Progressing   Problem: Coping: Goal: Level of anxiety will decrease Outcome: Progressing   Problem: Elimination: Goal: Will not experience complications related to bowel motility Outcome: Progressing Goal: Will not experience complications related to urinary  retention Outcome: Progressing   Problem: Pain Managment: Goal: General experience of comfort will improve Outcome: Progressing   Problem: Safety: Goal: Ability to remain free from injury will improve Outcome: Progressing   Problem: Skin Integrity: Goal: Risk for impaired skin integrity will decrease Outcome: Progressing   

## 2023-06-18 NOTE — H&P (Signed)
06/18/23  [facility]  BURLE KIVETT 12-Mar-1966 [id number]   Referring provider: Elberta Fortis, MD [address] [place],  [place] [address]       Chief Complaint  Patient presents with   discuss procedure      RALP      HPI: 57 year-old male with a personal history of prostate cancer. He presents today to discuss radical prostatectomy.    He is known to have elevated PSA up to 16.9. He also has fairly significant urinary symptoms. Previous IPSS 25/35. He had a CT abdomen pelvis with contrast that showed heterogenous uptake and enhancement on the right side of the prostate.    Ultimately, he underwent a prostate biopsy. He was known to have 37.81 gram prostate. Notably, his right peripheral zone was hypoechoic at the base. Surgical pathology showed high volume, Gleason 4+3 disease, all on the right side, up to 100% of the tissue, 4+3 and 3+4.    He had a PSMA PET scan that showed avid uptake in the right hemiprostate along with a posterior as well as very mild radiotracer accumulation of right external iliac nodes suspicious for oligometastatic disease.    His BMI is 23. He has had no previous abdominal surgeries.    No baseline ED.        PMH:     Past Medical History:  Diagnosis Date   Asthma     Compression fracture of spine (HCC)     Gout     HTN (hypertension)     Osteoporosis     Prostate cancer [facility])            Surgical History:      Past Surgical History:  Procedure Laterality Date   TONSILLECTOMY              Home Medications:  Allergies as of 04/25/2023         Reactions    Penicillins              Medication List           Accurate as of April 25, 2023  3:18 PM. If you have any questions, ask your nurse or doctor.              STOP taking these medications     hydrOXYzine 10 MG tablet Commonly known as: ATARAX    tamsulosin 0.4 MG Caps capsule Commonly known as: FLOMAX    tiZANidine 2 MG tablet Commonly known as: ZANAFLEX            TAKE these medications     albuterol 108 (90 Base) MCG/ACT inhaler Commonly known as: VENTOLIN HFA Inhale 2 puffs into the lungs every 6 (six) hours as needed for wheezing.    colchicine 0.6 MG tablet Take by mouth.    cyanocobalamin 1000 MCG tablet Commonly known as: VITAMIN B12 Take 1 tablet (1,000 mcg total) by mouth daily.    irbesartan 150 MG tablet Commonly known as: AVAPRO Take 1 tablet (150 mg total) by mouth daily.    naproxen 500 MG tablet Commonly known as: NAPROSYN TAKE 1 TABLET(500 MG) BY MOUTH TWICE DAILY WITH A MEAL             Allergies:  Allergies      Allergies  Allergen Reactions   Penicillins          Family History:      Family History  Problem Relation Age of Onset   Hypertension  Mother     Lung cancer Mother     Osteoporosis Mother     Hypertension Father     Cancer Father          Abdominal   Heart attack Father     Hypertension Sister     Multiple sclerosis Sister            Social History:  reports that he has been smoking cigarettes. He has a 45.00 pack-year smoking history. His smokeless tobacco use includes chew. He reports that he does not currently use alcohol after a past usage of about 168.0 standard drinks of alcohol per week. He reports that he does not currently use drugs.     Physical Exam: BP (!) 161/102   Pulse 69   Ht 5\' 9"  (1.753 m)   Wt 161 lb (73 kg)   BMI 23.78 kg/m   Constitutional:  Alert and oriented, No acute distress. HEENT: [employer] AT, moist mucus membranes.  Trachea midline, no masses. Abdomen: No hernias or other abnormalities noted Neurologic: Grossly intact, no focal deficits, moving all 4 extremities. Psychiatric: Normal mood and affect.   Assessment & Plan:     1. Prostate cancer - High-volume, aggressive Gleason 4+3 disease predominantly on the right side with possible local regional oligometastasis diease, personally reviewed PET scan with questionable right external iliac node -  Urinary symptoms may be related due to prostate obstruction - Emphasized the importance of aggressive treatment due to the patient's relatively young age and good health - He was counseled about the natural history of prostate cancer and the standard treatment options that are available for prostate cancer. It was explained to him how his age and life expectancy, clinical stage, Gleason score, and PSA affect his prognosis, the decision to proceed with additional staging studies, as well as how that information influences recommended treatment strategies. We discussed the roles for active surveillance, radiation therapy, surgical therapy, androgen deprivation, as well as ablative therapy options for the treatment of prostate cancer as appropriate to his individual cancer situation. We discussed the risks and benefits of these options with regard to their impact on cancer control and also in terms of potential adverse events, complications, and impact on quality of life particularly related to urinary, bowel, and sexual function. The patient was encouraged to ask questions throughout the discussion today and all questions were answered to his stated satisfaction. In addition, the patient was provided with and/or directed to appropriate resources and literature for further education about prostate cancer treatment options. -We discussed surgical therapy for prostate cancer including the different available surgical approaches.  Specifically, we discussed robotic prostatectomy with pelvic lymph node dissection based on his restratification.  We discussed, in detail, the risks and expectations of surgery with regard to cancer control, urinary control, and erectile dysfunction as well as expected post operative recovery processed. Additional risks of surgery including but not limited to bleeding, infection, hernia formation, nerve damage, fistula formation, bowel/rectal injury, potentially necessitating colostomy,  damage to the urinary tract resulting in urinary leakage, urethral stricture, and cardiopulmonary risk such as myocardial infarction, stroke, death, thromboembolism etc. were explained.  -Will plan for lymph node dissection -Non-nerve sparing on right, understands implications of this -Very high risk for needing additional salvage or adjuvant treatment, patient understands  - To be scheduled for surgery and pre-surgical physical therapy. - Handout on Kegel exercises to be provided. - He is to be contacted by the surgical scheduler for the surgery date.  Return for radical prostatectomy.   I have reviewed the above documentation for accuracy and completeness, and I agree with the above.    Vanna Scotland, MD     [facility] [address], [address] [place], [place] [address] [phone]

## 2023-06-18 NOTE — Anesthesia Postprocedure Evaluation (Signed)
Anesthesia Post Note  Patient: Frank Terry  Procedure(s) Performed: XI ROBOTIC ASSISTED LAPAROSCOPIC RADICAL PROSTATECTOMY (Abdomen) PELVIC LYMPH NODE DISSECTION (Bilateral: Abdomen)  Patient location during evaluation: PACU Anesthesia Type: General Level of consciousness: awake and alert, oriented and patient cooperative Pain management: pain level controlled Vital Signs Assessment: post-procedure vital signs reviewed and stable Respiratory status: spontaneous breathing, nonlabored ventilation and respiratory function stable Cardiovascular status: blood pressure returned to baseline and stable Postop Assessment: adequate PO intake Anesthetic complications: no   No notable events documented.   Last Vitals:  Vitals:   06/18/23 1145 06/18/23 1200  BP: 110/76 117/72  Pulse: 73 79  Resp: 15 11  Temp:    SpO2: 99% 99%    Last Pain:  Vitals:   06/18/23 1200  TempSrc:   PainSc: 5                  Reed Breech

## 2023-06-18 NOTE — Op Note (Signed)
06/18/23  PREOPERATIVE DIAGNOSIS: Prostate cancer.  POSTOPERATIVE DIAGNOSIS: Prostate cancer.  OPERATION PERFORMED: 1. DaVinci laparoscopic radical prostatectomy (non nerve sparing on right, partial on left) 2 DaVinci laproscopic bilateral pelvic lymph node dissection.  SURGEON: Vanna Scotland, MD  ASSISTANTS: Legrand Rams, MD  ANESTHESIA: General.  EBL: 50 cc  SPECIMEN: Prostate with bilateral seminal vesicals, bilateral pelvic lymph nodes, anterior fat pad.  Posterior apical margin.  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  INDICATION: Pt.is a 57 year old male with Gleason unfavorable intermediate risk prostate cancer, high-volume prostate cancer. Treatment options were discussed with him at length and he chose DaVinci radical prostatectomy.  No his fairly good erections preop, based on the volume of disease, plan for non-nersparing on the right.  PROCEDURE IN DETAIL: Patient was given appropriate perioperative antibiotics. He had sequential compression devices applied preoperatively for DVT prophylaxis. He was taken to the operating room where he was induced with general anesthesia. After adequate anesthesia, he was placed in the dorsal lithotomy position. His arms were draped by his side and was appropriately padded and secured to the operating room table. He was placed in the Trendelenburg position.  He was prepped and draped in sterile fashion. An 48 French Foley was placed in the bladder and instilled with 15 cc sterile water. Orogastric tube was placed. The Veress needle was passed just above the umbilicus and the abdomen was insufflated to 15 atmospheres. An 8 mm, blunt-tip trocar was placed just above the umbilicus. The zero-degree camera was passed within this and the following trocars were placed under direct vision; 8 mm robotic trocars were placed 9 cm laterally and inferiorly to the initially placed umbilical trocar.  A third one was placed 7 cm lateral to the left-sided trocar. In the corresponding position on the right side, a 12 mm trocar was placed, and then a 5 mm trocar was placed to the right and well above the umbilicus.  The 12 mm assistant port site was preclosed using 0 Vicryl suture using a Carter-Thomason which was tied down at the end of the case to close this port site.  The robot was then docked with the robot trocar. I used the zero-degree camera. I had the hot scissors in the right hand and the left hand with the [place] bipolar and far left hand the Prograsp forceps. Initially I divided the median umbilical ligament bilaterally and the urachus and developed the space of Retzius down to pubic bone. I divided the parietal peritoneum laterally up to the vas deferens on each side. I used the prograsp forceps to provide cranial traction on the urachus. I cleaned off the Endopelvic fascia on each side and then divided it with the scissors laterally to the perirectal fat and medially to the puboprostatic ligaments which were divided. I then ligated the dorsal vein complex using a 60 mm vascular load stapler.   I then addressed the bladder neck. I identified the bladder neck by pulling on the Foley catheter. I divided the anterior bladder neck musculature until I then found the anterior bladder neck mucosa which was incised. I identified the Foley catheter within, deflated the balloon, pulled the Foley out through this opening and then using the Carter-Thomason needle with a #0-Vicryl suture, passed through The suprapubic region and pulled the suture through the eye of the Foley and then back out. This allowed me to provide upward traction on the prostate. I then divided the lateral bladder neck mucosa and the posterior bladder neck mucosa.  I was well away from ureteral orifices. I divided the posterior bladder neck musculature until I identified the vas deferens.  Like him at the  beginning of the case, he had a known congenital absent left kidney.  The left vas was now palpable.  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  They were freed proximally, then divided. I freed up the seminal vesicals using blunt and sharp dissection. Judicious use of electrocautery was used near the seminal vesicle tips to avoid injury to neurovascular bundle.   I then went back to the 0-degree lens. I divided the Denonvilliers fascia beneath the prostate and developed the prostate off the rectum. I then did a non nerve sparing by  creating a plane which was extrafascial. I then isolated the pedicles of the prostate and placed weck clips on the pedicles of the prostate and then divided it with cold scissors. I continued to divide the neurovascular bundles off the prostate out to the apex of the prostate.  I did do a partial nerve sparing on the left side and intrafascial plane.  At this point the prostate was freed up except for the urethra. I addressed the prostate anteriorly, divided the dorsal vein , then the anterior urethral wall, pulled the Foley catheter back and then divided the posterior urethral wall. Specimen was completely freed up. I placed the prostate in an Endo catch bag and then placed the bag in the upper abdomen out of the way. I then irrigated the pelvis. The rectal test was negative. There was reasonable hemostasis.  I then did the pelvic lymph node dissection by incising the fascia overlying the right external iliac vein, dissecting distally. I went just distal to the node of [place] where we placed clips and then divided the lymphatics. The lateral aspect of the dissection was the pelvic side wall, inferior was the obturator nerve and proximal the hypogastric vessels. I placed clips at the proximal aspect and then divided the lymphatics.  This was removed with the spoon grasper and sent to pathology.  I did do a more extensive dissection on the side including proximal to the external iliacs based on his preoperative PET scan, essentially all structures were skeletonized on the side.  I then did the left obturator lymph node dissection in the same fashion as the left side though less extensive.  With good hemostasis, I then did the posterior reconstruction. I used a 3-0 VLoc suture through the cut edge of Denonvilliers fascia beneath the bladder on the right side and through the posterior striated sphincter underneath the urethra. This brought the bladder neck and urethra and closer proximity to help facilitate anastomosis.   I then did the urethral vesicle anastomosis again with two 3-0 VLoc sutures interlocked. I passed both ends of the suture from the outside-in through the bladder neck at the 6 o'clock position. I passed both through the urethral stump from the inside-out in the corresponding position. I reapproximated the bladder neck to the urethra. I then ran the Left suture on the left side anastomosis to the 9 o'clock position. Then I went back to the right sided suture and ran that up the right side to the 12 o'clock position. I then continued the left suture to the 12 o'clock position. The suture was then suspended anteriorly behind the pubic bone.   I then placed a new  18 French Foley into the bladder and filled it with 10 cc sterile water. I irrigated the bladder with 160 cc. There was no leakage. There was reasonable hemostasis.  Surgicel was used on either side of the pedicles for an additional hemostasis.  Surgi-Flo was also used. The instruments were then removed. The robot was undocked and all the trocars were removed under direct vision. There was good hemostasis. I then enlarged the umbilical trocar site large enough to remove the prostate and I closed the fascia here with #0-0 Vicryl suture in a  running fashion. All the port sites were irrigated. Lidocaine was injected into all the trocar sites. The skin was closed with 4-0 Monocryl in running subcuticular fashion. Dermabond was applied.   At this point patient was awakened and extubated in the operating room and taken to the recovery room in stable condition. There were no complications. All counts correct.  An assistant was required for this surgical procedure. The duties of the assistant included but were not limited to suctioning, passing suture, camera manipulation, retraction. This procedure would not be able to be performed without an Geophysicist/field seismologist.    Vanna Scotland, MD

## 2023-06-18 NOTE — Anesthesia Preprocedure Evaluation (Addendum)
Anesthesia Evaluation  Patient identified by MRN, date of birth, ID band Patient awake    Reviewed: Allergy & Precautions, NPO status , Patient's Chart, lab work & pertinent test results  History of Anesthesia Complications Negative for: history of anesthetic complications  Airway Mallampati: II   Neck ROM: Full    Dental  (+) Chipped   Pulmonary asthma , former smoker (quit June 2024)   Pulmonary exam normal breath sounds clear to auscultation       Cardiovascular hypertension, Normal cardiovascular exam Rhythm:Regular Rate:Normal  ECG 03/03/23: Sinus tachycardia Right atrial enlargement  Echo 12/08/19:  1. The study quality is technically difficult.  2. The EF is estimated at 55-60%.  3. Normal left ventricular diastolic filling is observed.  4. There is a trivial amount of mitral regurgitation.     Neuro/Psych  PSYCHIATRIC DISORDERS Anxiety     Alcohol use disorder, quit April 2024    GI/Hepatic PUD,GERD  ,,  Endo/Other  negative endocrine ROS    Renal/GU Renal disease (congenital absence of one kidney)     Musculoskeletal   Abdominal   Peds  Hematology negative hematology ROS (+)   Anesthesia Other Findings   Reproductive/Obstetrics                             Anesthesia Physical Anesthesia Plan  ASA: 3  Anesthesia Plan: General   Post-op Pain Management:    Induction: Intravenous  PONV Risk Score and Plan: 2 and Ondansetron, Dexamethasone and Treatment may vary due to age or medical condition  Airway Management Planned: Oral ETT  Additional Equipment:   Intra-op Plan:   Post-operative Plan: Extubation in OR  Informed Consent: I have reviewed the patients History and Physical, chart, labs and discussed the procedure including the risks, benefits and alternatives for the proposed anesthesia with the patient or authorized representative who has indicated his/her  understanding and acceptance.     Dental advisory given  Plan Discussed with: CRNA  Anesthesia Plan Comments: (Patient consented for risks of anesthesia including but not limited to:  - adverse reactions to medications - damage to eyes, teeth, lips or other oral mucosa - nerve damage due to positioning  - sore throat or hoarseness - damage to heart, brain, nerves, lungs, other parts of body or loss of life  Informed patient about role of CRNA in peri- and intra-operative care.  Patient voiced understanding.)        Anesthesia Quick Evaluation

## 2023-06-18 NOTE — Anesthesia Procedure Notes (Signed)
Procedure Name: Intubation Date/Time: 06/18/2023 8:03 AM  Performed by: Chelsea Aus, CRNAPre-anesthesia Checklist: Patient identified, Emergency Drugs available, Suction available and Patient being monitored Patient Re-evaluated:Patient Re-evaluated prior to induction Oxygen Delivery Method: Circle system utilized Preoxygenation: Pre-oxygenation with 100% oxygen Induction Type: IV induction Ventilation: Mask ventilation without difficulty Laryngoscope Size: Mac and 3 Grade View: Grade II Tube type: Oral Tube size: 7.0 mm Number of attempts: 1 Placement Confirmation: ETT inserted through vocal cords under direct vision, positive ETCO2 and breath sounds checked- equal and bilateral Secured at: 23 cm Tube secured with: Tape Dental Injury: Teeth and Oropharynx as per pre-operative assessment

## 2023-06-19 ENCOUNTER — Encounter: Payer: Self-pay | Admitting: Urology

## 2023-06-19 ENCOUNTER — Other Ambulatory Visit: Payer: Self-pay

## 2023-06-19 DIAGNOSIS — J45909 Unspecified asthma, uncomplicated: Secondary | ICD-10-CM | POA: Diagnosis not present

## 2023-06-19 DIAGNOSIS — I10 Essential (primary) hypertension: Secondary | ICD-10-CM | POA: Diagnosis not present

## 2023-06-19 DIAGNOSIS — C61 Malignant neoplasm of prostate: Secondary | ICD-10-CM | POA: Diagnosis not present

## 2023-06-19 DIAGNOSIS — F17210 Nicotine dependence, cigarettes, uncomplicated: Secondary | ICD-10-CM | POA: Diagnosis not present

## 2023-06-19 MED ORDER — OXYBUTYNIN CHLORIDE 5 MG PO TABS
5.0000 mg | ORAL_TABLET | Freq: Three times a day (TID) | ORAL | 1 refills | Status: DC | PRN
  Filled 2023-06-19: qty 30, 10d supply, fill #0

## 2023-06-19 MED ORDER — DOCUSATE SODIUM 100 MG PO CAPS
100.0000 mg | ORAL_CAPSULE | Freq: Two times a day (BID) | ORAL | 0 refills | Status: DC
  Filled 2023-06-19: qty 10, 5d supply, fill #0

## 2023-06-19 MED ORDER — CHLORHEXIDINE GLUCONATE CLOTH 2 % EX PADS
6.0000 | MEDICATED_PAD | Freq: Every day | CUTANEOUS | Status: DC
  Administered 2023-06-19: 6 via TOPICAL

## 2023-06-19 MED ORDER — OXYCODONE-ACETAMINOPHEN 5-325 MG PO TABS
1.0000 | ORAL_TABLET | Freq: Four times a day (QID) | ORAL | 0 refills | Status: DC | PRN
  Filled 2023-06-19: qty 5, 1d supply, fill #0

## 2023-06-19 NOTE — Plan of Care (Signed)
  Problem: Education: Goal: Knowledge of the procedure and recovery process will improve Outcome: Progressing   Problem: Bowel/Gastric: Goal: Gastrointestinal status for postoperative course will improve Outcome: Progressing   Problem: Pain Management: Goal: General experience of comfort will improve Outcome: Progressing   Problem: Skin Integrity: Goal: Demonstration of wound healing without infection will improve Outcome: Progressing   Problem: Urinary Elimination: Goal: Ability to avoid or minimize complications of infection will improve Outcome: Progressing Goal: Ability to achieve and maintain urine output will improve Outcome: Progressing Goal: Home care management will improve Outcome: Progressing   Problem: Education: Goal: Knowledge of General Education information will improve Description: Including pain rating scale, medication(s)/side effects and non-pharmacologic comfort measures Outcome: Progressing   Problem: Health Behavior/Discharge Planning: Goal: Ability to manage health-related needs will improve Outcome: Progressing   Problem: Clinical Measurements: Goal: Ability to maintain clinical measurements within normal limits will improve Outcome: Progressing Goal: Will remain free from infection Outcome: Progressing Goal: Diagnostic test results will improve Outcome: Progressing Goal: Respiratory complications will improve Outcome: Progressing Goal: Cardiovascular complication will be avoided Outcome: Progressing   Problem: Activity: Goal: Risk for activity intolerance will decrease Outcome: Progressing   Problem: Nutrition: Goal: Adequate nutrition will be maintained Outcome: Progressing   Problem: Coping: Goal: Level of anxiety will decrease Outcome: Progressing   Problem: Elimination: Goal: Will not experience complications related to bowel motility Outcome: Progressing Goal: Will not experience complications related to urinary  retention Outcome: Progressing   Problem: Pain Managment: Goal: General experience of comfort will improve Outcome: Progressing   Problem: Safety: Goal: Ability to remain free from injury will improve Outcome: Progressing   Problem: Skin Integrity: Goal: Risk for impaired skin integrity will decrease Outcome: Progressing   

## 2023-06-19 NOTE — Discharge Summary (Signed)
Date of admission: 06/18/2023  Date of discharge: 06/19/2023  Admission diagnosis: Prostate cancer  Discharge diagnosis: Same as above  Secondary diagnoses:  Patient Active Problem List   Diagnosis Date Noted   Fatigue 04/06/2023   Knee pain, bilateral 03/19/2023   Compression fracture of spine (HCC) 03/19/2023   Anxious mood 03/19/2023   B12 deficiency 03/19/2023   Hypertension 03/03/2023   Gastric ulcer 03/03/2023   Alcohol use disorder 03/02/2023   Hyponatremia 03/02/2023   Asthma, chronic 03/02/2023   Gout 03/02/2023   Prostate cancer (HCC) 03/02/2023   History and Physical: For full details, please see admission history and physical. Briefly, Frank Terry is a 57 y.o. year old patient admitted on 06/18/2023 for scheduled robotic assisted laparoscopic radical prostatectomy, non nerve sparing on right, partial on left, with bilateral pelvic lymph node dissection with Dr. Apolinar Junes for management of prostate cancer.  As of this morning, he reports his pain is well-controlled, he has been ambulatory, and he is tolerating clears without difficulty.  Foley catheter in place draining clear, yellow urine.  Diet orders advanced.  As of this afternoon, he is passing flatus and tolerating PO.  Physical Exam: Constitutional:  Alert and oriented, no acute distress, nontoxic appearing HEENT: [place], AT Cardiovascular: No clubbing, cyanosis, or edema Respiratory: Normal respiratory effort, no increased work of breathing GI: Abdomen is soft, nontender, no rigidity or rebound. Surgical incisions are clean, dry, intact with overlying surgical adhesive. Skin: No rashes, bruises or suspicious lesions Neurologic: Grossly intact, no focal deficits, moving all 4 extremities Psychiatric: Normal mood and affect   Hospital Course: Patient tolerated the procedure well.  He was then transferred to the floor after an uneventful PACU stay.  His hospital course was uncomplicated.  On POD#1 he had met discharge  criteria: was eating a regular diet, was up and ambulating independently,  pain was well controlled, catheter was draining well, and was ready for discharge.  Laboratory values:  Recent Labs    06/19/23 0628  WBC 8.4  HGB 13.0  HCT 37.8*   Recent Labs    06/19/23 0628  NA 136  K 3.8  CL 105  CO2 25  GLUCOSE 103*  BUN 10  CREATININE 0.80  CALCIUM 8.5*   Results for orders placed or performed during the hospital encounter of 06/13/23  Urine Culture     Status: None   Collection Time: 06/13/23 10:46 AM   Specimen: Urine, Clean Catch  Result Value Ref Range Status   Specimen Description   Final    URINE, CLEAN CATCH Performed at [facility], [address], [place], [place] [address]    Special Requests   Final    NONE Performed at [facility], [address] [place], [place] [address]    Culture   Final    NO GROWTH Performed at [facility], [address] [place], [place] [address]    Report Status 06/14/2023 FINAL  Final   Disposition: Home  Discharge instruction: The patient was instructed to be ambulatory but told to refrain from heavy lifting, strenuous activity, or driving. Foley care instructions provided by nursing.  Discharge medications:  Allergies as of 06/19/2023       Reactions   Penicillins Rash        Medication List     TAKE these medications    acetaminophen 500 MG tablet Commonly known as: TYLENOL Take 1,000 mg by mouth every 6 (six) hours as needed.  albuterol 108 (90 Base) MCG/ACT inhaler Commonly known as: VENTOLIN HFA Inhale 2 puffs into the lungs every 6 (six) hours as needed for wheezing.   colchicine 0.6 MG tablet Take 0.6 mg by mouth as needed.   docusate sodium 100 MG capsule Commonly known as: COLACE Take 1 capsule (100 mg total) by mouth 2 (two) times daily.   irbesartan 150 MG tablet Commonly known as: AVAPRO Take 1 tablet (150 mg total) by mouth daily. What changed: when to take  this   nicotine 14 mg/24hr patch Commonly known as: NICODERM CQ - dosed in mg/24 hours Place 14 mg onto the skin daily.   nicotine polacrilex 4 MG gum Commonly known as: NICORETTE Take 4 mg by mouth as needed for smoking cessation.   oxybutynin 5 MG tablet Commonly known as: DITROPAN Take 1 tablet (5 mg total) by mouth every 8 (eight) hours as needed for bladder spasms.   oxyCODONE-acetaminophen 5-325 MG tablet Commonly known as: PERCOCET/ROXICET Take 1-2 tablets by mouth every 6 (six) hours as needed for moderate pain.        Followup:   Follow-up Information     Carman Ching, PA-C Follow up on 06/25/2023.   Specialty: Urology Why: For catheter removal Contact information: [address] [place] [address] [phone]

## 2023-06-19 NOTE — Progress Notes (Signed)
Transition of Care [facility]) - Inpatient Brief Assessment   Patient Details  Name: Frank Terry MRN: [id number] Date of Birth: 1966/03/12  Transition of Care [facility]) CM/SW Contact:    Truddie Hidden, RN Phone Number: 06/19/2023, 1:53 PM   Clinical Narrative: TOC assessing for ongoing needs and discharge planning.   Transition of Care Asessment: Insurance and Status: Insurance coverage has been reviewed Patient has primary care physician: Yes Home environment has been reviewed: Return to home Prior level of function:: Independent Prior/Current Home Services: No current home services Social Determinants of Health Reivew: SDOH reviewed no interventions necessary Readmission risk has been reviewed: Yes Transition of care needs: no transition of care needs at this time

## 2023-06-20 ENCOUNTER — Emergency Department
Admission: EM | Admit: 2023-06-20 | Discharge: 2023-06-20 | Discharge status: Against Medical Advice | Payer: Medicare HMO | Attending: Emergency Medicine | Admitting: Emergency Medicine

## 2023-06-20 ENCOUNTER — Emergency Department: Payer: Medicare HMO

## 2023-06-20 ENCOUNTER — Other Ambulatory Visit: Payer: Self-pay

## 2023-06-20 ENCOUNTER — Telehealth: Payer: Self-pay

## 2023-06-20 DIAGNOSIS — R609 Edema, unspecified: Secondary | ICD-10-CM | POA: Diagnosis not present

## 2023-06-20 DIAGNOSIS — I10 Essential (primary) hypertension: Secondary | ICD-10-CM | POA: Diagnosis not present

## 2023-06-20 DIAGNOSIS — Z9079 Acquired absence of other genital organ(s): Secondary | ICD-10-CM | POA: Diagnosis not present

## 2023-06-20 DIAGNOSIS — R109 Unspecified abdominal pain: Secondary | ICD-10-CM | POA: Insufficient documentation

## 2023-06-20 DIAGNOSIS — I700 Atherosclerosis of aorta: Secondary | ICD-10-CM | POA: Diagnosis not present

## 2023-06-20 DIAGNOSIS — K567 Ileus, unspecified: Secondary | ICD-10-CM | POA: Diagnosis not present

## 2023-06-20 DIAGNOSIS — K4090 Unilateral inguinal hernia, without obstruction or gangrene, not specified as recurrent: Secondary | ICD-10-CM | POA: Diagnosis not present

## 2023-06-20 DIAGNOSIS — G8918 Other acute postprocedural pain: Secondary | ICD-10-CM | POA: Diagnosis not present

## 2023-06-20 DIAGNOSIS — N3289 Other specified disorders of bladder: Secondary | ICD-10-CM | POA: Diagnosis not present

## 2023-06-20 DIAGNOSIS — R1031 Right lower quadrant pain: Secondary | ICD-10-CM | POA: Diagnosis not present

## 2023-06-20 LAB — CBC
HCT: 39.2 % (ref 39.0–52.0)
Hemoglobin: 13.8 g/dL (ref 13.0–17.0)
MCH: 32.9 pg (ref 26.0–34.0)
MCHC: 35.2 g/dL (ref 30.0–36.0)
MCV: 93.6 fL (ref 80.0–100.0)
Platelets: 193 10*3/uL (ref 150–400)
RBC: 4.19 MIL/uL — ABNORMAL LOW (ref 4.22–5.81)
RDW: 12.2 % (ref 11.5–15.5)
WBC: 6.9 10*3/uL (ref 4.0–10.5)
nRBC: 0 % (ref 0.0–0.2)

## 2023-06-20 LAB — COMPREHENSIVE METABOLIC PANEL
ALT: 15 U/L (ref 0–44)
AST: 24 U/L (ref 15–41)
Albumin: 4 g/dL (ref 3.5–5.0)
Alkaline Phosphatase: 41 U/L (ref 38–126)
Anion gap: 8 (ref 5–15)
BUN: 13 mg/dL (ref 6–20)
CO2: 23 mmol/L (ref 22–32)
Calcium: 9 mg/dL (ref 8.9–10.3)
Chloride: 104 mmol/L (ref 98–111)
Creatinine, Ser: 1 mg/dL (ref 0.61–1.24)
GFR, Estimated: >60 mL/min (ref >60)
Glucose, Bld: 94 mg/dL (ref 70–99)
Potassium: 4.1 mmol/L (ref 3.5–5.1)
Sodium: 135 mmol/L (ref 135–145)
Total Bilirubin: 1.2 mg/dL (ref 0.3–1.2)
Total Protein: 7.3 g/dL (ref 6.5–8.1)

## 2023-06-20 LAB — URINALYSIS, ROUTINE W REFLEX MICROSCOPIC
Bacteria, UA: NONE SEEN
Bilirubin Urine: NEGATIVE
Glucose, UA: NEGATIVE mg/dL
Hgb urine dipstick: NEGATIVE
Ketones, ur: NEGATIVE mg/dL
Nitrite: NEGATIVE
Protein, ur: NEGATIVE mg/dL
Specific Gravity, Urine: 1.018 (ref 1.005–1.030)
Squamous Epithelial / HPF: NONE SEEN /HPF (ref 0–5)
pH: 6 (ref 5.0–8.0)

## 2023-06-20 LAB — LIPASE, BLOOD: Lipase: 33 U/L (ref 11–51)

## 2023-06-20 MED ORDER — IPRATROPIUM-ALBUTEROL 0.5-2.5 (3) MG/3ML IN SOLN
3.0000 mL | Freq: Once | RESPIRATORY_TRACT | Status: AC
  Administered 2023-06-20: 3 mL via RESPIRATORY_TRACT
  Filled 2023-06-20: qty 3

## 2023-06-20 MED ORDER — KETOROLAC TROMETHAMINE 30 MG/ML IJ SOLN
30.0000 mg | Freq: Once | INTRAMUSCULAR | Status: AC
  Administered 2023-06-20: 30 mg via INTRAVENOUS
  Filled 2023-06-20: qty 1

## 2023-06-20 MED ORDER — MORPHINE SULFATE (PF) 4 MG/ML IV SOLN
4.0000 mg | Freq: Once | INTRAVENOUS | Status: AC
  Administered 2023-06-20: 4 mg via INTRAVENOUS
  Filled 2023-06-20: qty 1

## 2023-06-20 MED ORDER — ONDANSETRON HCL 4 MG/2ML IJ SOLN
4.0000 mg | Freq: Once | INTRAMUSCULAR | Status: AC
  Administered 2023-06-20: 4 mg via INTRAVENOUS
  Filled 2023-06-20: qty 2

## 2023-06-20 MED ORDER — SODIUM CHLORIDE 0.9 % IV BOLUS
1000.0000 mL | Freq: Once | INTRAVENOUS | Status: AC
  Administered 2023-06-20: 1000 mL via INTRAVENOUS

## 2023-06-20 MED ORDER — IOHEXOL 300 MG/ML  SOLN
100.0000 mL | Freq: Once | INTRAMUSCULAR | Status: AC | PRN
  Administered 2023-06-20: 100 mL via INTRAVENOUS

## 2023-06-20 NOTE — ED Triage Notes (Addendum)
Pt to ed from home via POV for pain in his incision areas. Pt had his prostate removed on 7/29 of this week. Pt is in excruciating pain. Pt has been taking tylenol and ibuprofen. One site appears to be very red.

## 2023-06-20 NOTE — ED Notes (Signed)
Pt requesting for IV to be removed so that he could go home. This RN encouraged patient to stay so that he can talk to the provider and informed patient that I was unsure if the provider was going to run more tests. Pt states "I don't care. He can call me at home because that's where I'll be". IV removed. Pt ambulatory from department.

## 2023-06-20 NOTE — Telephone Encounter (Signed)
It is very common for the right lateral-most port to have this pulling/tugging sensation.  This is the only trocar site that has a deep internal stitch because the incision is slightly larger on the side.  I do not think in the absence of any other symptoms, there is any reason to be concerned at this point.    If he starts to drain, have bright redness around the incision or develops nausea vomiting, he should be seen immediately.  Vanna Scotland, MD

## 2023-06-20 NOTE — ED Provider Notes (Signed)
[facility] Provider Note    Event Date/Time   First MD Initiated Contact with Patient 06/20/23 1707     (approximate)  History   Chief Complaint: Post-op Problem (Prostectomy 7/29)  HPI  Frank Terry is a 57 y.o. male with a past medical history of anxiety, gastric reflux, hypertension, recent prostatectomy who presents to the emergency department for right-sided abdominal pain.  According to the patient he had a prostatectomy performed 2 days ago by Dr. Apolinar Junes.  States he has had worsening pain to the right mid abdomen around one of his incisions.  Denies any urinary symptoms has a Foley catheter in place with good output per patient.  Denies any fever.  Patient presents for worsening pain.  States he was discharged with a few pills of Percocet but he is run out of them.  Physical Exam   Triage Vital Signs: ED Triage Vitals  Encounter Vitals Group     BP 06/20/23 1542 (!) 139/100     Systolic BP Percentile --      Diastolic BP Percentile --      Pulse Rate 06/20/23 1542 90     Resp 06/20/23 1542 20     Temp 06/20/23 1542 98 F (36.7 C)     Temp Source 06/20/23 1542 Oral     SpO2 06/20/23 1542 98 %     Weight 06/20/23 1543 156 lb 8.4 oz (71 kg)     Height 06/20/23 1543 5\' 9"  (1.753 m)     Head Circumference --      Peak Flow --      Pain Score 06/20/23 1542 10     Pain Loc --      Pain Education --      Exclude from Growth Chart --     Most recent vital signs: Vitals:   06/20/23 1542  BP: (!) 139/100  Pulse: 90  Resp: 20  Temp: 98 F (36.7 C)  SpO2: 98%    General: Awake, no distress.  CV:  Good peripheral perfusion.  Regular rate and rhythm  Resp:  Normal effort.  Equal breath sounds bilaterally.  Abd:  No distention.  Soft, tenderness to palpation over the mid to right abdomen.  Several incisions present he has tenderness over the right abdominal incision there is very minimal erythema around this incision but no sign of abscess or  significant cellulitis.  No dehiscence.   ED Results / Procedures / Treatments   EKG  EKG viewed and interpreted by myself shows a normal sinus rhythm 82 bpm the narrow QRS normal axis, normal intervals, no concerning ST changes.  RADIOLOGY  I have reviewed and interpreted CT images, patient appears to have some air within the abdominal cavity which is expected 2 days postoperatively. Radiology has read the CT scan as status post prostatectomy with some presacral edema in the lower pelvis consistent with recent surgery.  Free air in the abdomen consistent with recent surgery.  Diffuse bladder wall thickening.  Reassuringly urinalysis does not show any sign of infection.  Urine culture has been sent.  There is some mildly dilated small bowel loops concerning for possible ileus less likely small bowel obstruction.  Appendix is upper limit of normal at 9 mm with no surrounding inflammation.   MEDICATIONS ORDERED IN ED: Medications  morphine (PF) 4 MG/ML injection 4 mg (has no administration in time range)  ondansetron (ZOFRAN) injection 4 mg (has no administration in time range)  sodium chloride  0.9 % bolus 1,000 mL (has no administration in time range)     IMPRESSION / MDM / ASSESSMENT AND PLAN / ED COURSE  I reviewed the triage vital signs and the nursing notes.  Patient's presentation is most consistent with acute presentation with potential threat to life or bodily function.  Patient presents emergency department for abdominal pain worsening since his prostatectomy 2 days ago by Dr. Apolinar Junes.  We will check labs, treat pain nausea IV hydrate and obtain a CT scan to further evaluate.  Patient agreeable to plan of care.  Lab work has resulted showing a normal CBC with a normal white blood cell count, reassuring chemistry and a normal/negative lipase.  For the CT findings of possible appendix inflammation I did talk to Dr. Elenor Legato of general surgery who believes it is less likely to be  appendicitis.  I spoke to Dr. Apolinar Junes who believes that the findings are consistent with postoperative state.  Patient continues to be in pain after IV morphine.  Dr. Apolinar Junes request that we try IV Toradol to see if this helps with the patient's pain.  If the patient is still having pain after IV Toradol Dr. Apolinar Junes states she would be willing to admit for postoperative pain control.  Patient care signed out to oncoming provider.  FINAL CLINICAL IMPRESSION(S) / ED DIAGNOSES   Postoperative pain  Note:  This document was prepared using Dragon voice recognition software and may include unintentional dictation errors.   Minna Antis, MD 06/20/23 2001

## 2023-06-20 NOTE — ED Provider Notes (Signed)
-----------------------------------------   8:38 PM on 06/20/2023 -----------------------------------------  Blood pressure (!) 146/89, pulse 96, temperature 98 F (36.7 C), temperature source Oral, resp. rate 18, height 5\' 9"  (1.753 m), weight 71 kg, SpO2 96%.  Assuming care from Dr. Lenard Lance.  In short, Frank Terry is a 57 y.o. male with a chief complaint of Post-op Problem (Prostectomy 7/29) .  Refer to the original H&P for additional details.  The current plan of care is to results of of pain relief medications.  Patient had right lower quadrant pain following a urologic procedure.  Imaging revealed a slightly dilated appendix but both urology and general surgery do not feel that this was appendicitis.  Plan was to treat with medications, and see whether patient's pain was controlled enough to go home with follow-up with urology.  Patient was to receive Toradol, wait an hour and recheck pain relief.  If this had not improved we would admit the patient to urology service.  Patient received his Toradol, while I was in another room performing a procedure he informed the nurse that he was going home.  Patient left with the knowledge that we are waiting to see if his pain was controlled to determine whether he can go home or not.  No prescriptions at this time.  Patient had already known to follow-up with his urologist.   ED diagnosis:  Right lower quadrant abdominal pain. Postoperative pain    Lanette Hampshire 06/20/23 2054    Minna Antis, MD 06/26/23 [phone]

## 2023-06-20 NOTE — Telephone Encounter (Signed)
Called patient back, he is almost at the ER, he is now feeling nauseas, flushed, the incision is burning and hurting so bad he can barely touch it and feeling dizzy.

## 2023-06-20 NOTE — Telephone Encounter (Signed)
Patient left message on triage line stating that he had prostatectomy done on 06/18/23 and came home yesterday. Today he is having sharp pain on the right side where the incision is. It feels like the incision is opening up but he does not have a visualization of this to support the feeling. Just has a sharp/pulling pain sensation. No other symptoms.

## 2023-06-20 NOTE — ED Notes (Signed)
Grey top on ice sent to lab.

## 2023-06-21 ENCOUNTER — Telehealth: Payer: Self-pay | Admitting: Urology

## 2023-06-21 NOTE — Telephone Encounter (Signed)
This patient was in the ED light night for incisional pain.  He ended up leaving before being reevaluated after some pain meds.  Can you please check to see how he is doing today?  Vanna Scotland, MD

## 2023-06-21 NOTE — Telephone Encounter (Signed)
Patient states he is better, he had a bowel movement and the pain is much better. He does have still some burning at the site of the incision. He does have to be careful with getting up and walking. His pain is managed by Tylenol at this time. Patient will update [place] if anything changes.

## 2023-06-22 ENCOUNTER — Telehealth: Payer: Self-pay

## 2023-06-22 NOTE — Telephone Encounter (Signed)
Pt left message on triage line stating his right incision was hurting when he moved or sneeze. Pt was called back to get more information about his pain but stated pain was gone. Pt states he think he over did it today but put ice on incision and pain went away.

## 2023-06-25 ENCOUNTER — Ambulatory Visit: Payer: Medicare HMO | Admitting: Physician Assistant

## 2023-06-25 ENCOUNTER — Encounter: Payer: Self-pay | Admitting: Physician Assistant

## 2023-06-25 VITALS — BP 125/84 | Temp 98.5°F | Ht 69.0 in | Wt 168.4 lb

## 2023-06-25 DIAGNOSIS — C61 Malignant neoplasm of prostate: Secondary | ICD-10-CM

## 2023-06-25 MED ORDER — SULFAMETHOXAZOLE-TRIMETHOPRIM 800-160 MG PO TABS
1.0000 | ORAL_TABLET | Freq: Once | ORAL | Status: AC
Start: 2023-06-25 — End: 2023-06-25
  Administered 2023-06-25: 1 via ORAL

## 2023-06-25 NOTE — Patient Instructions (Signed)
Physical therapy scheduling: [phone]

## 2023-06-25 NOTE — Progress Notes (Signed)
Catheter Removal  Patient is present today for a catheter removal.  10ml of water was drained from the balloon. A 18FR foley cath was removed from the bladder, no complications were noted. Patient tolerated well.  Performed by: Carman Ching, PA-C   Additional notes: 1 dose Bactrim DS administered prior to Foley removal for UTI prevention.  Incisions c/d/I with minimal surrounding bruising. Abdomen soft with appropriate postop tenderness R>L, no rigidity or rebound. No leg swelling, chest pain, SOB. Having bowel movements, no N/V.  We discussed surgical pathology with one positive lymph node, consideration of adjuvant radiation and/or ADT TBD per postop PSA and in further discussion with Dr. Apolinar Junes.  Follow up: Return in about 4 weeks (around 07/23/2023) for Postop f/u with Dr. Apolinar Junes with PSA prior.

## 2023-06-26 ENCOUNTER — Telehealth: Payer: Self-pay

## 2023-06-26 NOTE — Telephone Encounter (Signed)
Transition Care Management Unsuccessful Follow-up Telephone Call  Date of discharge and from where:  [place] 7/31  Attempts:  1st Attempt  Reason for unsuccessful TCM follow-up call:  No answer/busy   Lenard Forth [facility] Guide, [facility] [phone] [address] [place], [place], [place] [address] Phone: [phone] Email: [email or web address]

## 2023-06-27 ENCOUNTER — Telehealth: Payer: Self-pay

## 2023-06-27 NOTE — Telephone Encounter (Signed)
Transition Care Management Follow-up Telephone Call Date of discharge and from where: [place] 7/31 How have you been since you were released from the hospital? Doing fine  Any questions or concerns? No  Items Reviewed: Did the pt receive and understand the discharge instructions provided? Yes  Medications obtained and verified? No  Other? No  Any new allergies since your discharge? No  Dietary orders reviewed? No Do you have support at home? Yes     Follow up appointments reviewed:  PCP Hospital f/u appt confirmed? No  Scheduled to see  on  @ . Specialist Hospital f/u appt confirmed? Yes  Scheduled to see  on  @ . Are transportation arrangements needed? No  If their condition worsens, is the pt aware to call PCP or go to the Emergency Dept.? Yes Was the patient provided with contact information for the PCP's office or ED? Yes Was to pt encouraged to call back with questions or concerns? Yes

## 2023-07-02 ENCOUNTER — Inpatient Hospital Stay: Payer: Medicare HMO | Attending: Oncology

## 2023-07-02 DIAGNOSIS — C61 Malignant neoplasm of prostate: Secondary | ICD-10-CM

## 2023-07-02 NOTE — Progress Notes (Signed)
[facility] Clinical Social Work  Clinical Social Work was referred by medical provider, [employer], for assessment of psychosocial needs.  Clinical Social Worker contacted patient by phone to offer support and assess for needs.    Patient stated that he had prostate surgery recently and that his girlfriend is having difficulty adjusting.  She requested counseling because she does not understand his emotional reaction to his sexual function and incontinence issue.  Ladon is going to speak with his girlfriend about scheduling a session and will call CSW back.     Dorothey Baseman, LCSW  Clinical Social Worker [employer] [facility] [employer]

## 2023-07-03 ENCOUNTER — Encounter: Payer: Self-pay | Admitting: Physical Therapy

## 2023-07-03 ENCOUNTER — Ambulatory Visit: Payer: Medicare HMO | Attending: Urology | Admitting: Physical Therapy

## 2023-07-03 DIAGNOSIS — C61 Malignant neoplasm of prostate: Secondary | ICD-10-CM | POA: Insufficient documentation

## 2023-07-03 DIAGNOSIS — R2689 Other abnormalities of gait and mobility: Secondary | ICD-10-CM | POA: Diagnosis not present

## 2023-07-03 DIAGNOSIS — M5459 Other low back pain: Secondary | ICD-10-CM | POA: Diagnosis not present

## 2023-07-03 DIAGNOSIS — R278 Other lack of coordination: Secondary | ICD-10-CM | POA: Insufficient documentation

## 2023-07-03 DIAGNOSIS — M533 Sacrococcygeal disorders, not elsewhere classified: Secondary | ICD-10-CM | POA: Insufficient documentation

## 2023-07-03 NOTE — Therapy (Unsigned)
OUTPATIENT PHYSICAL THERAPY EVALUATION   Patient Name: CANDICE MISNER MRN: [id number] DOB:01-03-1966, 57 y.o., male Today's Date: 07/03/2023   PT End of Session - 07/03/23 1428     Visit Number 1    Number of Visits 10    Date for PT Re-Evaluation 09/11/23    PT Start Time 1425    PT Stop Time 1505    PT Time Calculation (min) 40 min    Activity Tolerance Patient tolerated treatment well;No increased pain    Behavior During Therapy WFL for tasks assessed/performed             Past Medical History:  Diagnosis Date   Alcohol use disorder    pt currently lives at the [facility]   Allergy    Anxiety    Arthritis    Asthma    well controlled   Compression fracture of spine (HCC)    Congenital absence of one kidney    pt has right kidney   Depression    Gastric ulcer    GERD (gastroesophageal reflux disease)    no meds   Gout    HTN (hypertension)    Hyponatremia    Osteoporosis    Prostate cancer (HCC)    Ulcer    Vasitis 05/2023   Past Surgical History:  Procedure Laterality Date   COLONOSCOPY WITH ESOPHAGOGASTRODUODENOSCOPY (EGD)     PELVIC LYMPH NODE DISSECTION Bilateral 06/18/2023   Procedure: PELVIC LYMPH NODE DISSECTION;  Surgeon: Vanna Scotland, MD;  Location: [facility] ORS;  Service: Urology;  Laterality: Bilateral;   PROSTATE BIOPSY     ROBOT ASSISTED LAPAROSCOPIC RADICAL PROSTATECTOMY N/A 06/18/2023   Procedure: XI ROBOTIC ASSISTED LAPAROSCOPIC RADICAL PROSTATECTOMY;  Surgeon: Vanna Scotland, MD;  Location: [facility] ORS;  Service: Urology;  Laterality: N/A;   TONSILLECTOMY     age 77   Patient Active Problem List   Diagnosis Date Noted   Fatigue 04/06/2023   Knee pain, bilateral 03/19/2023   Compression fracture of spine (HCC) 03/19/2023   Anxious mood 03/19/2023   B12 deficiency 03/19/2023   Hypertension 03/03/2023   Gastric ulcer 03/03/2023   Alcohol use disorder 03/02/2023   Hyponatremia 03/02/2023   Asthma, chronic 03/02/2023   Gout 03/02/2023    Prostate cancer (HCC) 03/02/2023    PCP: Elberta Fortis, MD  REFERRING PROVIDER: Apolinar Junes MD   REFERRING DIAG: Prostate Cancer   Rationale for Evaluation and Treatment Rehabilitation  THERAPY DIAG:  Sacrococcygeal disorders, not elsewhere classified  Other abnormalities of gait and mobility  Other lack of coordination  ONSET DATE: Prostate Surgery RALP 06/18/23   SUBJECTIVE:  SUBJECTIVE STATEMENT: 1) urinary leakage s/p 2 weeks RALP: pt changes briefs every hour.  Pt does not change at night. Pt can sense the urge to go and able to make it before leakage.  [place] with coughing, laughing, sneezing, sit to stand, bending, standing to put on briefs. Pt normally do not drink any water, 2 L soda, 28 oz of coffee,   2) testicular pain:  sensitive to the touch, pressure there . It hurts when he has to hold it to get the the toilet before leakage and with sitting 5-6/10 pain   3) CLBP with R thigh numbness, L lateral thigh: 5/10     Compression Fx at low and midback   PERTINENT HISTORY:  Quit smoking in May 12, 2023 , recovering alcoholic and living at recovering home   PAIN:  Are you having pain? Yes: see above    PRECAUTIONS: lifting restrictions for 6 weeks ( pt currently at 2 weeks post op)   WEIGHT BEARING RESTRICTIONS: No  FALLS:  Has patient fallen in last 6 months? No  LIVING ENVIRONMENT: Lives with: recovering house ( recovering alcoholic)  Lives in: Group home Stairs: moved to 1st floor after surgery   OCCUPATION: prior to diasability status, pt worked [employer] representative , Engineer, maintenance (IT)   PLOF: Independent  PATIENT GOALS:  Have as much control as he can get  Improve LBP    OBJECTIVE:      HOME EXERCISE PROGRAM: See pt instruction section    ASSESSMENT:  CLINICAL  IMPRESSION:              urinary leakage, testicular pain s/p RALP 06/18/23 ( 2 weeks) and CLBP,    Pt benefits from skilled PT.    OBJECTIVE IMPAIRMENTS decreased activity tolerance, decreased coordination, decreased endurance, decreased mobility, difficulty walking, decreased ROM, decreased strength, decreased safety awareness, hypomobility, increased muscle spasms, impaired flexibility, improper body mechanics, postural dysfunction, and pain. scar restrictions   ACTIVITY LIMITATIONS  self-care,  sleep, home chores, work tasks    PARTICIPATION LIMITATIONS:  community, gym activities    PERSONAL FACTORS        are also affecting patient's functional outcome.    REHAB POTENTIAL: Good   CLINICAL DECISION MAKING: Evolving/moderate complexity   EVALUATION COMPLEXITY: Moderate    PATIENT EDUCATION:    Education details: Showed pt anatomy images. Explained muscles attachments/ connection, physiology of deep core system/ spinal- thoracic-pelvis-lower kinetic chain as they relate to pt's presentation, Sx, and past Hx. Explained what and how these areas of deficits need to be restored to balance and function    See Therapeutic activity / neuromuscular re-education section  Answered pt's questions.   Person educated: Patient Education method: Explanation, Demonstration, Tactile cues, Verbal cues, and Handouts Education comprehension: verbalized understanding, returned demonstration, verbal cues required, tactile cues required, and needs further education     PLAN: PT FREQUENCY: 1x/week   PT DURATION: 10 weeks   PLANNED INTERVENTIONS: Therapeutic exercises, Therapeutic activity, Neuromuscular re-education, Balance training, Gait training, Patient/Family education, Self Care, Joint mobilization, Spinal mobilization, Moist heat, Taping, and Manual therapy, dry needling.   PLAN FOR NEXT SESSION: See clinical impression for plan     GOALS: Goals reviewed with patient? Yes  SHORT  TERM GOALS: Target date: 07/31/2023    Pt will demo IND with HEP                    Baseline: Not IND  Goal status: INITIAL   LONG TERM GOALS: Target date: 09/11/2023    1.Pt will demo proper deep core coordination without chest breathing and optimal excursion of diaphragm/pelvic floor in order to promote spinal stability and pelvic floor function  Baseline: dyscoordination Goal status: INITIAL  2.  Pt will demo > 5 pt change on FOTO  to improve QOL and function  PFDI Urinary baseline -  Lower score = better function  Urinary Problem baseline-   Higher score = better function  Pelvic Pain baseline - Lower score = better function  Bowel  constipation baseline -   Higher score = better function  PFDI Bowel - Higher score = better function  Lumber baseline  -  Higher score = better function   Goal status: INITIAL  3.  Pt will demo proper body mechanics in against gravity tasks and ADLs  work tasks, fitness  to minimize straining pelvic floor / back                  Baseline: not IND, improper form that places strain on pelvic floor                Goal status: INITIAL    4. Pt increases water intake from 0 to 24 fl oz of water , decrease from 2 L to  1 L soda ,  coffee from 28 to  14 fl oz in order to decrease urinary frequency and improve bladder health  Baseline: Pt normally do not drink any water, 2 L soda, 28 oz of coffee,  Goal status: INITIAL    5. Pt will report < 2-3/10 pain at testicular pain with sitting with proper posture  in order to return to community events  Baseline: pt with pain sitting and delaying urine to make it to the toilet 5-6/10  Goal status: INITIAL   6.  Pt will demo levelled pelvic girdle and shoulder height in order to progress to deep core strengthening HEP and restore mobility at spine, pelvis, gait, posture minimize falls, and improve balance   Baseline: R shoulder higher, L iliac crest, R sideflexion/ rotation caused  Radiating R LBP  Goal status: INITIAL  7. Pt will demo increased gait speed > 1.3 m/s in order to ambulate safely in community and return to fitness routine   Baseline: 1.08 m/s, L trunk lean, excessive pelvic sway to L, no arm swing/ trunk rotation   Goal status: Initial      Mariane Masters, PT 07/03/2023, 2:34 PM

## 2023-07-03 NOTE — Patient Instructions (Addendum)
Avoid straining pelvic floor, abdominal muscles , spine  __   Proper body mechanics with getting out of a chair to decrease strain  on back &pelvic floor   Avoid holding your breath when Getting out of the chair:  Scoot to front part of chair chair Heels behind knees, feet are hip width apart, nose over toes  Inhale like you are smelling roses Exhale to stand   __  Sitting with feet on ground, four points of contact Catch yourself crossing ankles and thighs  __ Pt increases water intake from 0 to 24 fl oz of water , decrease from 2 L to  1 L soda ,  coffee from 28 to  14 fl oz in order to decrease urinary frequency and improve bladder health   __   sit on folded towel halfway on it for anterior tilt of pelvis to relief testicular pain

## 2023-07-09 ENCOUNTER — Ambulatory Visit: Payer: Medicare HMO | Admitting: Physical Therapy

## 2023-07-09 DIAGNOSIS — R2689 Other abnormalities of gait and mobility: Secondary | ICD-10-CM

## 2023-07-09 DIAGNOSIS — M5459 Other low back pain: Secondary | ICD-10-CM | POA: Diagnosis not present

## 2023-07-09 DIAGNOSIS — R278 Other lack of coordination: Secondary | ICD-10-CM | POA: Diagnosis not present

## 2023-07-09 DIAGNOSIS — M533 Sacrococcygeal disorders, not elsewhere classified: Secondary | ICD-10-CM

## 2023-07-09 DIAGNOSIS — C61 Malignant neoplasm of prostate: Secondary | ICD-10-CM | POA: Diagnosis not present

## 2023-07-09 NOTE — Therapy (Signed)
OUTPATIENT PHYSICAL THERAPY TREATMENT    Patient Name: Frank Terry MRN: [id number] DOB:Mar 27, 1966, 57 y.o., male Today's Date: 07/09/2023   PT End of Session - 07/09/23 1334     Visit Number 2    Number of Visits 10    Date for PT Re-Evaluation 09/11/23    PT Start Time 1330    PT Stop Time 1415    PT Time Calculation (min) 45 min    Activity Tolerance Patient tolerated treatment well;No increased pain    Behavior During Therapy WFL for tasks assessed/performed             Past Medical History:  Diagnosis Date   Alcohol use disorder    pt currently lives at the [facility]   Allergy    Anxiety    Arthritis    Asthma    well controlled   Compression fracture of spine (HCC)    Congenital absence of one kidney    pt has right kidney   Depression    Gastric ulcer    GERD (gastroesophageal reflux disease)    no meds   Gout    HTN (hypertension)    Hyponatremia    Osteoporosis    Prostate cancer (HCC)    Ulcer    Vasitis 05/2023   Past Surgical History:  Procedure Laterality Date   COLONOSCOPY WITH ESOPHAGOGASTRODUODENOSCOPY (EGD)     PELVIC LYMPH NODE DISSECTION Bilateral 06/18/2023   Procedure: PELVIC LYMPH NODE DISSECTION;  Surgeon: Vanna Scotland, MD;  Location: [facility];  Service: Urology;  Laterality: Bilateral;   PROSTATE BIOPSY     ROBOT ASSISTED LAPAROSCOPIC RADICAL PROSTATECTOMY N/A 06/18/2023   Procedure: XI ROBOTIC ASSISTED LAPAROSCOPIC RADICAL PROSTATECTOMY;  Surgeon: Vanna Scotland, MD;  Location: [facility];  Service: Urology;  Laterality: N/A;   TONSILLECTOMY     age 19   Patient Active Problem List   Diagnosis Date Noted   Fatigue 04/06/2023   Knee pain, bilateral 03/19/2023   Compression fracture of spine (HCC) 03/19/2023   Anxious mood 03/19/2023   B12 deficiency 03/19/2023   Hypertension 03/03/2023   Gastric ulcer 03/03/2023   Alcohol use disorder 03/02/2023   Hyponatremia 03/02/2023   Asthma, chronic 03/02/2023   Gout 03/02/2023    Prostate cancer (HCC) 03/02/2023    PCP: Elberta Fortis, MD  REFERRING PROVIDER: Apolinar Junes MD   REFERRING DIAG: Prostate Cancer   Rationale for Evaluation and Treatment Rehabilitation  THERAPY DIAG:  Sacrococcygeal disorders, not elsewhere classified  Other abnormalities of gait and mobility  Other lack of coordination  Other low back pain  ONSET DATE: Prostate Surgery RALP 06/18/23   SUBJECTIVE:  SUBJECTIVE STATEMENT TODAY: Pt reports 40% less leakage. Pt practice the sit to stand with exhalation. The only time leakage occurs is when he is walking standing. Since he has been practicing sitting taller, he notices a pain in the midback by L shoulder blade    SUBJECTIVE STATEMENT 07/03/23 Eval: 1) urinary leakage s/p 2 weeks RALP: pt changes briefs every hour.  Pt does not change at night. Pt can sense the urge to go and able to make it before leakage.  [place] with coughing, laughing, sneezing, sit to stand, bending, standing to put on briefs. Pt normally do not drink any water, 2 L soda, 28 oz of coffee,   2) testicular pain:  sensitive to the touch, pressure there . It hurts when he has to hold it to get the the toilet before leakage and with sitting 5-6/10 pain   3) CLBP with R thigh numbness, L lateral thigh: 5/10     Compression Fx at low and midback   PERTINENT HISTORY:  Quit smoking in May 12, 2023 , recovering alcoholic and living at recovering home   PAIN:  Are you having pain? Yes: see above    PRECAUTIONS: lifting restrictions for 6 weeks ( pt currently at 2 weeks post op)   WEIGHT BEARING RESTRICTIONS: No  FALLS:  Has patient fallen in last 6 months? No  LIVING ENVIRONMENT: Lives with: recovering house ( recovering alcoholic)  Lives in: Group home Stairs: moved to 1st floor  after surgery   OCCUPATION: prior to diasability status, pt worked [employer] , Engineer, maintenance (IT)   PLOF: Independent  PATIENT GOALS:  Have as much control as he can get  Improve LBP    OBJECTIVE:     [facility] PT Assessment - 07/09/23 1337       Observation/Other Assessments   Scoliosis levelled pelvis, L shoulder lower, more posterior prominence at T/L junction Convex      Palpation   Spinal mobility tightness L parapsinal. interspinal, intercostals, medial scapula , deviation to L at t C/T junction             [facility] Adult PT Treatment/Exercise - 07/09/23 1429       Therapeutic Activites    Therapeutic Activities Other Therapeutic Activities      Neuro Re-ed    Neuro Re-ed Details  cued for neck/ scapular mobility HEP to realign C/T junction      Modalities   Modalities Moist Heat      Moist Heat Therapy   Number Minutes Moist Heat 5 Minutes    Moist Heat Location --   thoracic/ cervical with support position L thoracic rotation, cervical L rotation     Manual Therapy   Manual therapy comments STM/MWM at L parapsinal. interspinal, intercostals, medial scapula , distraction at occiput/ medial glide at C/T junction to realign              HOME EXERCISE PROGRAM: See pt instruction section    ASSESSMENT:  CLINICAL IMPRESSION:             Pt reported 40% less leakage since 1st visit.  Pelvic was levelled upon return.  Addressed realignment of spine/to optimize IAP system for improved pelvic floor function.  Shoulders were levelled post Tx. Pt reported decreased pain at midback by L shoulder from 5/10 to 3/10. Pt demo'd improved diaphragmatic excursion which deems him ready for deep core training if pelvis and spine remain aligned. Pt required cues for new HEP .   Plan  to address pelvic floor issues once pelvis and spine are realigned to yield better outcomes.   Pt benefits from skilled PT.    OBJECTIVE IMPAIRMENTS decreased activity tolerance, decreased  coordination, decreased endurance, decreased mobility, difficulty walking, decreased ROM, decreased strength, decreased safety awareness, hypomobility, increased muscle spasms, impaired flexibility, improper body mechanics, postural dysfunction, and pain. scar restrictions   ACTIVITY LIMITATIONS  self-care,  sleep, home chores, work tasks    PARTICIPATION LIMITATIONS:  community, gym activities    PERSONAL FACTORS        are also affecting patient's functional outcome.    REHAB POTENTIAL: Good   CLINICAL DECISION MAKING: Evolving/moderate complexity   EVALUATION COMPLEXITY: Moderate    PATIENT EDUCATION:    Education details: Showed pt anatomy images. Explained muscles attachments/ connection, physiology of deep core system/ spinal- thoracic-pelvis-lower kinetic chain as they relate to pt's presentation, Sx, and past Hx. Explained what and how these areas of deficits need to be restored to balance and function    See Therapeutic activity / neuromuscular re-education section  Answered pt's questions.   Person educated: Patient Education method: Explanation, Demonstration, Tactile cues, Verbal cues, and Handouts Education comprehension: verbalized understanding, returned demonstration, verbal cues required, tactile cues required, and needs further education     PLAN: PT FREQUENCY: 1x/week   PT DURATION: 10 weeks   PLANNED INTERVENTIONS: Therapeutic exercises, Therapeutic activity, Neuromuscular re-education, Balance training, Gait training, Patient/Family education, Self Care, Joint mobilization, Spinal mobilization, Moist heat, Taping, and Manual therapy, dry needling.   PLAN FOR NEXT SESSION: See clinical impression for plan     GOALS: Goals reviewed with patient? Yes  SHORT TERM GOALS: Target date: 07/31/2023    Pt will demo IND with HEP                    Baseline: Not IND            Goal status: INITIAL   LONG TERM GOALS: Target date: 09/11/2023    1.Pt will  demo proper deep core coordination without chest breathing and optimal excursion of diaphragm/pelvic floor in order to promote spinal stability and pelvic floor function  Baseline: dyscoordination Goal status: INITIAL  2.  Pt will demo > 5 pt change on FOTO  to improve QOL and function  PFDI Urinary baseline -  Lower score = better function  Urinary Problem baseline-   Higher score = better function  Pelvic Pain baseline - Lower score = better function  Bowel  constipation baseline -   Higher score = better function  PFDI Bowel - Higher score = better function  Lumber baseline  -   31pts Higher score = better function   Goal status: INITIAL  3.  Pt will demo proper body mechanics in against gravity tasks and ADLs  work tasks, fitness  to minimize straining pelvic floor / back                  Baseline: not IND, improper form that places strain on pelvic floor                Goal status: INITIAL    4. Pt increases water intake from 0 to 24 fl oz of water , decrease from 2 L to  1 L soda ,  coffee from 28 to  14 fl oz in order to decrease urinary frequency and improve bladder health  Baseline: Pt normally do not drink any water, 2 L soda, 28 oz  of coffee,  Goal status: INITIAL    5. Pt will report < 2-3/10 pain at testicular pain with sitting with proper posture  in order to return to community events  Baseline: pt with pain sitting and delaying urine to make it to the toilet 5-6/10  Goal status: INITIAL   6.  Pt will demo levelled pelvic girdle and shoulder height in order to progress to deep core strengthening HEP and restore mobility at spine, pelvis, gait, posture minimize falls, and improve balance   Baseline: R shoulder higher, L iliac crest, R sideflexion/ rotation caused Radiating R LBP  Goal status: INITIAL  7. Pt will demo increased gait speed > 1.3 m/s in order to ambulate safely in community and return to fitness routine   Baseline: 1.08 m/s, L trunk lean,   excessive pelvic sway to L, no arm swing/ trunk rotation   Goal status: Initial      Mariane Masters, PT 07/09/2023, 1:35 PM

## 2023-07-09 NOTE — Patient Instructions (Signed)
  Avoid straining pelvic floor, abdominal muscles , spine  Use log rolling technique instead of getting out of bed with your neck or the sit-up     Log rolling into and out of bed   Log rolling into and out of bed If getting out of bed on R side, Bent knees, scoot hips/ shoulder to L  Raise R arm completely overhead, rolling onto armpit  Then lower bent knees to bed to get into complete side lying position  Then drop legs off bed, and push up onto R elbow/forearm, and use L hand to push onto the bed    Dig elbows and feet to lift hte buttocks and scoot without lifting head    __   Neck / shoulder stretches:    Lying on back - small sushi roll towel under neck  _ 6 directions  5 reps   _angel wings, lower elbows down , keep arms touching bed  10 reps

## 2023-07-14 ENCOUNTER — Emergency Department
Admission: EM | Admit: 2023-07-14 | Discharge: 2023-07-14 | Disposition: A | Discharge status: Home / Self Care | Payer: Medicare HMO | Attending: Emergency Medicine | Admitting: Emergency Medicine

## 2023-07-14 ENCOUNTER — Other Ambulatory Visit: Payer: Self-pay

## 2023-07-14 ENCOUNTER — Emergency Department: Payer: Medicare HMO

## 2023-07-14 DIAGNOSIS — R1031 Right lower quadrant pain: Secondary | ICD-10-CM | POA: Diagnosis present

## 2023-07-14 DIAGNOSIS — M96843 Postprocedural seroma of a musculoskeletal structure following other procedure: Secondary | ICD-10-CM | POA: Diagnosis not present

## 2023-07-14 DIAGNOSIS — K91872 Postprocedural seroma of a digestive system organ or structure following a digestive system procedure: Secondary | ICD-10-CM | POA: Diagnosis not present

## 2023-07-14 DIAGNOSIS — L7634 Postprocedural seroma of skin and subcutaneous tissue following other procedure: Secondary | ICD-10-CM | POA: Diagnosis not present

## 2023-07-14 DIAGNOSIS — Z8546 Personal history of malignant neoplasm of prostate: Secondary | ICD-10-CM | POA: Diagnosis not present

## 2023-07-14 DIAGNOSIS — R1012 Left upper quadrant pain: Secondary | ICD-10-CM | POA: Diagnosis not present

## 2023-07-14 LAB — COMPREHENSIVE METABOLIC PANEL
ALT: 19 U/L (ref 0–44)
AST: 19 U/L (ref 15–41)
Albumin: 4.2 g/dL (ref 3.5–5.0)
Alkaline Phosphatase: 50 U/L (ref 38–126)
Anion gap: 8 (ref 5–15)
BUN: 13 mg/dL (ref 6–20)
CO2: 23 mmol/L (ref 22–32)
Calcium: 9.5 mg/dL (ref 8.9–10.3)
Chloride: 108 mmol/L (ref 98–111)
Creatinine, Ser: 1.03 mg/dL (ref 0.61–1.24)
GFR, Estimated: >60 mL/min (ref >60)
Glucose, Bld: 94 mg/dL (ref 70–99)
Potassium: 4.2 mmol/L (ref 3.5–5.1)
Sodium: 139 mmol/L (ref 135–145)
Total Bilirubin: 0.8 mg/dL (ref 0.3–1.2)
Total Protein: 7.5 g/dL (ref 6.5–8.1)

## 2023-07-14 LAB — CBC
HCT: 46.7 % (ref 39.0–52.0)
Hemoglobin: 15.2 g/dL (ref 13.0–17.0)
MCH: 31.9 pg (ref 26.0–34.0)
MCHC: 32.5 g/dL (ref 30.0–36.0)
MCV: 98.1 fL (ref 80.0–100.0)
Platelets: 242 10*3/uL (ref 150–400)
RBC: 4.76 MIL/uL (ref 4.22–5.81)
RDW: 12.2 % (ref 11.5–15.5)
WBC: 6.4 10*3/uL (ref 4.0–10.5)
nRBC: 0 % (ref 0.0–0.2)

## 2023-07-14 LAB — URINALYSIS, ROUTINE W REFLEX MICROSCOPIC
Bacteria, UA: NONE SEEN
Bilirubin Urine: NEGATIVE
Glucose, UA: NEGATIVE mg/dL
Ketones, ur: NEGATIVE mg/dL
Leukocytes,Ua: NEGATIVE
Nitrite: NEGATIVE
Protein, ur: NEGATIVE mg/dL
Specific Gravity, Urine: 1.005 (ref 1.005–1.030)
Squamous Epithelial / HPF: NONE SEEN /HPF (ref 0–5)
pH: 5 (ref 5.0–8.0)

## 2023-07-14 LAB — LIPASE, BLOOD: Lipase: 48 U/L (ref 11–51)

## 2023-07-14 MED ORDER — MORPHINE SULFATE (PF) 4 MG/ML IV SOLN
4.0000 mg | Freq: Once | INTRAVENOUS | Status: AC
  Administered 2023-07-14: 4 mg via INTRAVENOUS
  Filled 2023-07-14: qty 1

## 2023-07-14 MED ORDER — OXYCODONE-ACETAMINOPHEN 5-325 MG PO TABS: 1.0000 | ORAL_TABLET | Freq: Three times a day (TID) | ORAL | 0 refills | Status: DC | PRN

## 2023-07-14 MED ORDER — ONDANSETRON HCL 4 MG/2ML IJ SOLN
4.0000 mg | Freq: Once | INTRAMUSCULAR | Status: AC
  Administered 2023-07-14: 4 mg via INTRAVENOUS
  Filled 2023-07-14: qty 2

## 2023-07-14 MED ORDER — IOHEXOL 300 MG/ML  SOLN
75.0000 mL | Freq: Once | INTRAMUSCULAR | Status: AC | PRN
  Administered 2023-07-14: 75 mL via INTRAVENOUS

## 2023-07-14 NOTE — ED Provider Notes (Signed)
[facility] Provider Note    Event Date/Time   First MD Initiated Contact with Patient 07/14/23 [phone]     (approximate)   History   Abdominal Pain   HPI  Frank Terry is a 57 y.o. male with a history of a radical prostatectomy at the end of July who presents with complaints of right-sided abdominal pain.  Patient reports that started over the last day and a half and has become more severe and has been steady and constant.  Not relieved by bowel movements.  Was seen for similar complaints in early August with generally reassuring CT scan.  Patient reports he has been pain-free for over a week prior to this starting.     Physical Exam   Triage Vital Signs: ED Triage Vitals  Encounter Vitals Group     BP 07/14/23 0845 (!) 171/116     Systolic BP Percentile --      Diastolic BP Percentile --      Pulse Rate 07/14/23 0845 80     Resp 07/14/23 0845 20     Temp 07/14/23 0845 97.9 F (36.6 C)     Temp Source 07/14/23 0845 Oral     SpO2 07/14/23 0845 100 %     Weight 07/14/23 0842 73.5 kg (162 lb)     Height 07/14/23 0842 1.753 m (5\' 9" )     Head Circumference --      Peak Flow --      Pain Score 07/14/23 0841 8     Pain Loc --      Pain Education --      Exclude from Growth Chart --     Most recent vital signs: Vitals:   07/14/23 0845  BP: (!) 171/116  Pulse: 80  Resp: 20  Temp: 97.9 F (36.6 C)  SpO2: 100%     General: Awake, no distress.  CV:  Good peripheral perfusion.  Resp:  Normal effort.  Abd:  No distention.  Tenderness in right lower quadrant primarily some right flank tenderness, incisions well-healed Other:     ED Results / Procedures / Treatments   Labs (all labs ordered are listed, but only abnormal results are displayed) Labs Reviewed  URINALYSIS, ROUTINE W REFLEX MICROSCOPIC - Abnormal; Notable for the following components:      Result Value   Color, Urine STRAW (*)    APPearance CLEAR (*)    Hgb urine dipstick  SMALL (*)    All other components within normal limits  LIPASE, BLOOD  COMPREHENSIVE METABOLIC PANEL  CBC     EKG     RADIOLOGY     PROCEDURES:  Critical Care performed:   Procedures   MEDICATIONS ORDERED IN ED: Medications  morphine (PF) 4 MG/ML injection 4 mg (4 mg Intravenous Given 07/14/23 0926)  ondansetron (ZOFRAN) injection 4 mg (4 mg Intravenous Given 07/14/23 0926)  iohexol (OMNIPAQUE) 300 MG/ML solution 75 mL (75 mLs Intravenous Contrast Given 07/14/23 0952)     IMPRESSION / MDM / ASSESSMENT AND PLAN / ED COURSE  I reviewed the triage vital signs and the nursing notes. Patient's presentation is most consistent with acute presentation with potential threat to life or bodily function.  Patient presents with right-sided abdominal pain as detailed above, differential includes postoperative pain, constipation, ureterolithiasis, infection, appendicitis  Will treat with IV morphine, IV Zofran, obtain labs, CT abdomen pelvis to reevaluate.  CT scan demonstrates enlarging likely seroma, discussed with Dr. Apolinar Junes of urology, she  suspects this is related to lymph node resection that was necessary, recommends pain control outpatient follow-up with her  Patient is feeling much better after treatment, he agrees with discharge and outpatient follow-up      FINAL CLINICAL IMPRESSION(S) / ED DIAGNOSES   Final diagnoses:  Postoperative seroma of musculoskeletal structure after non-musculoskeletal procedure     Rx / DC Orders   ED Discharge Orders          Ordered    oxyCODONE-acetaminophen (PERCOCET) 5-325 MG tablet  Every 8 hours PRN        07/14/23 1120             Note:  This document was prepared using Dragon voice recognition software and may include unintentional dictation errors.   Jene Every, MD 07/14/23 [phone]

## 2023-07-14 NOTE — ED Triage Notes (Signed)
Pt to ED POV for nausea and LUQ abdominal pain since yesterday. Pt had prostate removed on 7/19. Pt believes pain is originating from incisions. No redness or swelling noted to incisions to abdomen.

## 2023-07-19 ENCOUNTER — Other Ambulatory Visit: Payer: Self-pay | Admitting: *Deleted

## 2023-07-19 ENCOUNTER — Ambulatory Visit: Payer: Medicare HMO | Admitting: Physical Therapy

## 2023-07-19 DIAGNOSIS — R2689 Other abnormalities of gait and mobility: Secondary | ICD-10-CM

## 2023-07-19 DIAGNOSIS — M533 Sacrococcygeal disorders, not elsewhere classified: Secondary | ICD-10-CM

## 2023-07-19 DIAGNOSIS — M5459 Other low back pain: Secondary | ICD-10-CM

## 2023-07-19 DIAGNOSIS — R278 Other lack of coordination: Secondary | ICD-10-CM | POA: Diagnosis not present

## 2023-07-19 DIAGNOSIS — C61 Malignant neoplasm of prostate: Secondary | ICD-10-CM

## 2023-07-19 NOTE — Patient Instructions (Signed)
  Lengthen Back rib by L  shoulder ( winging)    Lie on R  side , pillow between knees and under head  Pull  L arm overhead over mattress, grab the edge of mattress,pull it upward, drawing elbow away from ears  Breathing 10 reps  Brushing arm with 3/4 turn onto pillow behind back  Lying on R  side ,Pillow/ Block between knees     dragging top forearm across ribs below breast rotating 3/4 turn,  rotating  _L_ only this week ,  relax onto the pillow behind the back  and then back to other palm , maintain top palm on body whole top and not lift shoulder  ___    Neck / shoulder stretches:    Lying on back - small sushi roll towel under neck and one folded towel under head  _ 6 directions  5 reps  _elbow circles in and out to lower shoulder blades down and back  10 reps  _angel wings, lower elbows down , keep arms touching bed  10 reps

## 2023-07-19 NOTE — Therapy (Signed)
OUTPATIENT PHYSICAL THERAPY TREATMENT    Patient Name: Frank Terry MRN: [id number] DOB:[birth year], 57 y.o., male Today's Date: 07/19/2023   PT End of Session - 07/19/23 1106     Visit Number 3    Number of Visits 10    Date for PT Re-Evaluation 09/11/23    PT Start Time 1105    PT Stop Time 1145    PT Time Calculation (min) 40 min    Activity Tolerance Patient tolerated treatment well;No increased pain    Behavior During Therapy WFL for tasks assessed/performed             Past Medical History:  Diagnosis Date   Alcohol use disorder    pt currently lives at the [facility]   Allergy    Anxiety    Arthritis    Asthma    well controlled   Compression fracture of spine (HCC)    Congenital absence of one kidney    pt has right kidney   Depression    Gastric ulcer    GERD (gastroesophageal reflux disease)    no meds   Gout    HTN (hypertension)    Hyponatremia    Osteoporosis    Prostate cancer (HCC)    Ulcer    Vasitis 05/2023   Past Surgical History:  Procedure Laterality Date   COLONOSCOPY WITH ESOPHAGOGASTRODUODENOSCOPY (EGD)     PELVIC LYMPH NODE DISSECTION Bilateral 06/18/2023   Procedure: PELVIC LYMPH NODE DISSECTION;  Surgeon: Vanna Scotland, MD;  Location: [facility];  Service: Urology;  Laterality: Bilateral;   PROSTATE BIOPSY     ROBOT ASSISTED LAPAROSCOPIC RADICAL PROSTATECTOMY N/A 06/18/2023   Procedure: XI ROBOTIC ASSISTED LAPAROSCOPIC RADICAL PROSTATECTOMY;  Surgeon: Vanna Scotland, MD;  Location: [facility];  Service: Urology;  Laterality: N/A;   TONSILLECTOMY     age 53   Patient Active Problem List   Diagnosis Date Noted   Fatigue 04/06/2023   Knee pain, bilateral 03/19/2023   Compression fracture of spine (HCC) 03/19/2023   Anxious mood 03/19/2023   B12 deficiency 03/19/2023   Hypertension 03/03/2023   Gastric ulcer 03/03/2023   Alcohol use disorder 03/02/2023   Hyponatremia 03/02/2023   Asthma, chronic 03/02/2023   Gout 03/02/2023    Prostate cancer (HCC) 03/02/2023    PCP: Elberta Fortis, MD  REFERRING PROVIDER: Apolinar Junes MD   REFERRING DIAG: Prostate Cancer   Rationale for Evaluation and Treatment Rehabilitation  THERAPY DIAG:  Sacrococcygeal disorders, not elsewhere classified  Other abnormalities of gait and mobility  Other lack of coordination  Other low back pain  ONSET DATE: Prostate Surgery RALP 06/18/23   SUBJECTIVE:  SUBJECTIVE STATEMENT TODAY: Pt noticed no more radiating R LBP this past week.   SUBJECTIVE STATEMENT 07/03/23 Eval: 1) urinary leakage s/p 2 weeks RALP: pt changes briefs every hour.  Pt does not change at night. Pt can sense the urge to go and able to make it before leakage.  [place] with coughing, laughing, sneezing, sit to stand, bending, standing to put on briefs. Pt normally do not drink any water, 2 L soda, 28 oz of coffee,   2) testicular pain:  sensitive to the touch, pressure there . It hurts when he has to hold it to get the the toilet before leakage and with sitting 5-6/10 pain   3) CLBP with R thigh numbness, L lateral thigh: 5/10     Compression Fx at low and midback   PERTINENT HISTORY:  Quit smoking in May 12, 2023 , recovering alcoholic and living at recovering home   PAIN:  Are you having pain? Yes: see above    PRECAUTIONS: lifting restrictions for 6 weeks ( pt currently at 2 weeks post op)   WEIGHT BEARING RESTRICTIONS: No  FALLS:  Has patient fallen in last 6 months? No  LIVING ENVIRONMENT: Lives with: recovering house ( recovering alcoholic)  Lives in: Group home Stairs: moved to 1st floor after surgery   OCCUPATION: prior to diasability status, pt worked [employer] representative , Engineer, maintenance ([employer])   PLOF: Independent  PATIENT GOALS:  Have as much control as he can get   Improve LBP    OBJECTIVE:   [facility] PT Assessment - 07/19/23 1107       Observation/Other Assessments   Scoliosis levelled pelvis, L shoulder lower, more posterior prominence at T/L junction Convex      Palpation   Spinal mobility Levelled pelvis, R shoulder slightly higher,    Palpation comment significant tightness at posterior / lateral intercostals, deviated C7, T1 L, tightness along paraspinals B/ medial scapula mm attachments B,  occiput, SCM L tighter             [facility] Adult PT Treatment/Exercise - 07/19/23 1201       Neuro Re-ed    Neuro Re-ed Details  cued for neck . thoracic stretches      Modalities   Modalities Moist Heat      Moist Heat Therapy   Number Minutes Moist Heat 5 Minutes    Moist Heat Location --   C/ T junction, supported pilow under knees. Provided guided relaxation     Manual Therapy   Manual therapy comments distraction at occiput B, STM/MWM at problem areas noted in assessment to promote realignment of C/T junction and more mobility               HOME EXERCISE PROGRAM: See pt instruction section    ASSESSMENT:  CLINICAL IMPRESSION:            Improvements this week: Pt noticed no more radiating R LBP this past week. Pt reported decreased pain at midback by L shoulder from 5/10 to 3/10 at the end of session last week and it has been maintained at 3/10 this past week.   Pelvic was levelled for the past 2 visits but R shoulder still is slightly higher but thorax is less rotated posteriorly L. Manual Tx was modified with gentle pressure when pt appeared with wincing tenderness/ pain.  Addressed C/T junction deviations and limited intercostal / diaphragmatic excursion.   Pt demo'd improved diaphragmatic excursion which deems him ready for deep core training if  pelvis and spine remain aligned. Pt required cues for new HEP .    Plan to address pelvic floor issues once pelvis and spine are realigned to yield better outcomes.    By  addressing realignment of spine will help to optimize IAP system for improved pelvic floor function.    Pt benefits from skilled PT.    OBJECTIVE IMPAIRMENTS decreased activity tolerance, decreased coordination, decreased endurance, decreased mobility, difficulty walking, decreased ROM, decreased strength, decreased safety awareness, hypomobility, increased muscle spasms, impaired flexibility, improper body mechanics, postural dysfunction, and pain. scar restrictions   ACTIVITY LIMITATIONS  self-care,  sleep, home chores, work tasks    PARTICIPATION LIMITATIONS:  community, gym activities    PERSONAL FACTORS        are also affecting patient's functional outcome.    REHAB POTENTIAL: Good   CLINICAL DECISION MAKING: Evolving/moderate complexity   EVALUATION COMPLEXITY: Moderate    PATIENT EDUCATION:    Education details: Showed pt anatomy images. Explained muscles attachments/ connection, physiology of deep core system/ spinal- thoracic-pelvis-lower kinetic chain as they relate to pt's presentation, Sx, and past Hx. Explained what and how these areas of deficits need to be restored to balance and function    See Therapeutic activity / neuromuscular re-education section  Answered pt's questions.   Person educated: Patient Education method: Explanation, Demonstration, Tactile cues, Verbal cues, and Handouts Education comprehension: verbalized understanding, returned demonstration, verbal cues required, tactile cues required, and needs further education     PLAN: PT FREQUENCY: 1x/week   PT DURATION: 10 weeks   PLANNED INTERVENTIONS: Therapeutic exercises, Therapeutic activity, Neuromuscular re-education, Balance training, Gait training, Patient/Family education, Self Care, Joint mobilization, Spinal mobilization, Moist heat, Taping, and Manual therapy, dry needling.   PLAN FOR NEXT SESSION: See clinical impression for plan     GOALS: Goals reviewed with patient? Yes  SHORT  TERM GOALS: Target date: 07/31/2023    Pt will demo IND with HEP                    Baseline: Not IND            Goal status: INITIAL   LONG TERM GOALS: Target date: 09/11/2023    1.Pt will demo proper deep core coordination without chest breathing and optimal excursion of diaphragm/pelvic floor in order to promote spinal stability and pelvic floor function  Baseline: dyscoordination Goal status: INITIAL  2.  Pt will demo > 5 pt change on FOTO  to improve QOL and function  PFDI Urinary baseline -  Lower score = better function  Urinary Problem baseline-   Higher score = better function  Pelvic Pain baseline - Lower score = better function  Bowel  constipation baseline -   Higher score = better function  PFDI Bowel - Higher score = better function  Lumber baseline  -   31pts Higher score = better function   Goal status: INITIAL  3.  Pt will demo proper body mechanics in against gravity tasks and ADLs  work tasks, fitness  to minimize straining pelvic floor / back                  Baseline: not IND, improper form that places strain on pelvic floor                Goal status: INITIAL    4. Pt increases water intake from 0 to 24 fl oz of water , decrease from 2 L to  1  L soda ,  coffee from 28 to  14 fl oz in order to decrease urinary frequency and improve bladder health  Baseline: Pt normally do not drink any water, 2 L soda, 28 oz of coffee,  Goal status: INITIAL    5. Pt will report < 2-3/10 pain at testicular pain with sitting with proper posture  in order to return to community events  Baseline: pt with pain sitting and delaying urine to make it to the toilet 5-6/10  Goal status: INITIAL   6.  Pt will demo levelled pelvic girdle and shoulder height in order to progress to deep core strengthening HEP and restore mobility at spine, pelvis, gait, posture minimize falls, and improve balance   Baseline: R shoulder higher, L iliac crest, R sideflexion/ rotation  caused Radiating R LBP  Goal status: INITIAL  7. Pt will demo increased gait speed > 1.3 m/s in order to ambulate safely in community and return to fitness routine   Baseline: 1.08 m/s, L trunk lean,  excessive pelvic sway to L, no arm swing/ trunk rotation   Goal status: Initial      Mariane Masters, PT 07/19/2023, 11:08 AM

## 2023-07-20 ENCOUNTER — Other Ambulatory Visit: Payer: Medicare HMO

## 2023-07-20 DIAGNOSIS — C61 Malignant neoplasm of prostate: Secondary | ICD-10-CM

## 2023-07-21 LAB — PSA: Prostate Specific Ag, Serum: 0.1 ng/mL (ref 0.0–4.0)

## 2023-07-24 ENCOUNTER — Ambulatory Visit: Payer: Medicare HMO | Admitting: Urology

## 2023-07-24 VITALS — BP 146/89 | HR 75 | Ht 69.0 in | Wt 173.5 lb

## 2023-07-24 DIAGNOSIS — Z08 Encounter for follow-up examination after completed treatment for malignant neoplasm: Secondary | ICD-10-CM

## 2023-07-24 DIAGNOSIS — Z8546 Personal history of malignant neoplasm of prostate: Secondary | ICD-10-CM

## 2023-07-24 DIAGNOSIS — N529 Male erectile dysfunction, unspecified: Secondary | ICD-10-CM

## 2023-07-24 DIAGNOSIS — N5231 Erectile dysfunction following radical prostatectomy: Secondary | ICD-10-CM

## 2023-07-24 MED ORDER — SILDENAFIL CITRATE 100 MG PO TABS: 100.0000 mg | ORAL_TABLET | Freq: Every day | ORAL | 11 refills | Status: DC | PRN

## 2023-07-24 MED ORDER — NONFORMULARY OR COMPOUNDED ITEM
0 refills | Status: DC
Start: 2023-07-24 — End: 2024-02-21

## 2023-07-24 NOTE — Progress Notes (Addendum)
Marcelle Overlie Plume,acting as a scribe for Vanna Scotland, MD.,have documented all relevant documentation on the behalf of Vanna Scotland, MD,as directed by  Vanna Scotland, MD while in the presence of Vanna Scotland, MD.  07/24/2023 11:36 AM   [name] 1966/06/27 [id number]  Referring provider: Elberta Fortis, MD [address] [place],  [place] [address]  Chief Complaint  Patient presents with   Post-op Follow-up    HPI: 57 year-old male with a personal history of prostate cancer who presents today for his postoperative evaluation.   He underwent radical robotic prostatectomy with bilateral pelvic lymph node dissection on 06/18/2023. It non nerve sparing on the right and partial on the left. We did do a more extensive lymph node dissection on the right side based on his PET scan, suggestive of a possible 5 mm right external iliac lymph node. Surgical pathology was consistent with Gleason 3+4 disease with focal positive margin at the right posterior lateral mid. Extraprostatic extension was noted. He also had an extended pelvic lymph node dissection that did in fact show a positive node. There is a discrepancy between a surgical pathology and interoperative lymph nodes. They indicate that 1 of 9 nodes on the left was positive, however the more extended lymph node dissection was done on the right. Suspect this was transcribed incorrectly. All of the nodes on the right were negative. [id number].  He has gone to the emergency room twice. Once for right lower quadrant pain. At that time, there was a question of possible appendicitis, although there is no clinical correlation with this and he ended up getting his pain under control and discharged.   More recently, he went back to the emergency room on 07/14/2023 that shows a postoperative seroma versus lymphocele in the right lower quadrant. All of his labs were benign and he was discharged with outpatient follow up.   His PSA on 07/20/2023 was 0.1,  down from 33 preoperatively.  Today, he reports stress incontinence, especially when sitting or laying down. No nocturnal incontinence. He has started to work with physical therapy regarding this.     PMH: Past Medical History:  Diagnosis Date   Alcohol use disorder    pt currently lives at the [facility]   Allergy    Anxiety    Arthritis    Asthma    well controlled   Compression fracture of spine (HCC)    Congenital absence of one kidney    pt has right kidney   Depression    Gastric ulcer    GERD (gastroesophageal reflux disease)    no meds   Gout    HTN (hypertension)    Hyponatremia    Osteoporosis    Prostate cancer (HCC)    Ulcer    Vasitis 05/2023    Surgical History: Past Surgical History:  Procedure Laterality Date   COLONOSCOPY WITH ESOPHAGOGASTRODUODENOSCOPY (EGD)     PELVIC LYMPH NODE DISSECTION Bilateral 06/18/2023   Procedure: PELVIC LYMPH NODE DISSECTION;  Surgeon: Vanna Scotland, MD;  Location: [facility];  Service: Urology;  Laterality: Bilateral;   PROSTATE BIOPSY     ROBOT ASSISTED LAPAROSCOPIC RADICAL PROSTATECTOMY N/A 06/18/2023   Procedure: XI ROBOTIC ASSISTED LAPAROSCOPIC RADICAL PROSTATECTOMY;  Surgeon: Vanna Scotland, MD;  Location: [facility];  Service: Urology;  Laterality: N/A;   TONSILLECTOMY     age 41    Home Medications:  Allergies as of 07/24/2023       Reactions   Penicillins Rash  Medication List        Accurate as of July 24, 2023 11:36 AM. If you have any questions, ask your nurse or doctor.          STOP taking these medications    docusate sodium 100 MG capsule Commonly known as: COLACE   nicotine 14 mg/24hr patch Commonly known as: NICODERM CQ - dosed in mg/24 hours   nicotine polacrilex 4 MG gum Commonly known as: NICORETTE   oxybutynin 5 MG tablet Commonly known as: DITROPAN   oxyCODONE-acetaminophen 5-325 MG tablet Commonly known as: Percocet       TAKE these medications     acetaminophen 500 MG tablet Commonly known as: TYLENOL Take 1,000 mg by mouth every 6 (six) hours as needed.   albuterol 108 (90 Base) MCG/ACT inhaler Commonly known as: VENTOLIN HFA Inhale 2 puffs into the lungs every 6 (six) hours as needed for wheezing.   colchicine 0.6 MG tablet Take 0.6 mg by mouth as needed.   irbesartan 150 MG tablet Commonly known as: AVAPRO Take 1 tablet (150 mg total) by mouth daily. What changed: when to take this   NONFORMULARY OR COMPOUNDED ITEM Trimix (30/1/10)-(Pap/Phent/PGE)  Test Dose  1ml vial   Qty #3 Refills 0  [facility] [phone] Fax [phone]   sildenafil 100 MG tablet Commonly known as: VIAGRA Take 1 tablet (100 mg total) by mouth daily as needed for erectile dysfunction.        Allergies:  Allergies  Allergen Reactions   Penicillins Rash    Family History: Family History  Problem Relation Age of Onset   Hypertension Mother    Lung cancer Mother    Osteoporosis Mother    Alcohol abuse Mother    Arthritis Mother    Cancer Mother    Heart disease Mother    Hypertension Father    Cancer Father        Abdominal   Heart attack Father    Hypertension Sister    Multiple sclerosis Sister     Social History:  reports that he has quit smoking. His smoking use included cigarettes. He started smoking about 40 years ago. He has quit using smokeless tobacco.  His smokeless tobacco use included chew. He reports that he does not currently use alcohol after a past usage of about 168.0 standard drinks of alcohol per week. He reports that he does not currently use drugs.   Physical Exam: BP (!) 146/89   Pulse 75   Ht 5\' 9"  (1.753 m)   Wt 173 lb 8 oz (78.7 kg)   BMI 25.62 kg/m   Constitutional:  Alert and oriented, No acute distress. HEENT: [place] AT, moist mucus membranes.  Trachea midline, no masses. Abdomen: Incisions healing well Neurologic: Grossly intact, no focal deficits, moving all 4  extremities. Psychiatric: Normal mood and affect.   Assessment & Plan:    1. Prostate cancer with metastatic local regional lymph nodes to the right iliac  - PSA persistence, 0.1 -Will plan to repeat this to ensure that it is truly related to persistent prostate cancer vs PSA checked prematurely - Return in 2 months for repeat PSA, consider short course of hormones x 6 months if PSA does not decrease further - If PSA persists or rises, will need adjuvant radiation but we prefer that he heal more from surgery - Discussed the discrepancy in lymph node pathology with him, likely a transcription error.  2. Erectile dysfunction - Status post radical prostatectomy -  Start daily Sildenafil 100mg . - Consider intracavernosal injection teaching with Trimix in one month (would like to go ahead and schedule this; trimix test dose script sent today)  3. Stress urinary incontinence -continue pelvic floor PT  Return in about 1 month (around 08/23/2023) for PA visit for intracaveranosal teaching. 2 months for repeat PSA.  I have reviewed the above documentation for accuracy and completeness, and I agree with the above.   Vanna Scotland, MD  [facility] [address], [address] [place], [place] [address] [phone]

## 2023-08-01 ENCOUNTER — Ambulatory Visit: Payer: Medicare HMO | Attending: Urology | Admitting: Physical Therapy

## 2023-08-01 DIAGNOSIS — R2689 Other abnormalities of gait and mobility: Secondary | ICD-10-CM

## 2023-08-01 DIAGNOSIS — R278 Other lack of coordination: Secondary | ICD-10-CM

## 2023-08-01 DIAGNOSIS — M533 Sacrococcygeal disorders, not elsewhere classified: Secondary | ICD-10-CM

## 2023-08-01 DIAGNOSIS — M5459 Other low back pain: Secondary | ICD-10-CM

## 2023-08-01 NOTE — Patient Instructions (Signed)
Deep core level 1- 2 (handout) twice a day

## 2023-08-01 NOTE — Therapy (Signed)
OUTPATIENT PHYSICAL THERAPY TREATMENT    Patient Name: Frank Terry MRN: [id number] DOB:August 07, 1966, 57 y.o., male Today's Date: 08/01/2023   PT End of Session - 08/01/23 0918     Visit Number 4    Number of Visits 10    Date for PT Re-Evaluation 09/11/23    PT Start Time 0915    PT Stop Time 1000    PT Time Calculation (min) 45 min    Activity Tolerance Patient tolerated treatment well;No increased pain    Behavior During Therapy WFL for tasks assessed/performed             Past Medical History:  Diagnosis Date   Alcohol use disorder    pt currently lives at the [facility]   Allergy    Anxiety    Arthritis    Asthma    well controlled   Compression fracture of spine (HCC)    Congenital absence of one kidney    pt has right kidney   Depression    Gastric ulcer    GERD (gastroesophageal reflux disease)    no meds   Gout    HTN (hypertension)    Hyponatremia    Osteoporosis    Prostate cancer (HCC)    Ulcer    Vasitis 05/2023   Past Surgical History:  Procedure Laterality Date   COLONOSCOPY WITH ESOPHAGOGASTRODUODENOSCOPY (EGD)     PELVIC LYMPH NODE DISSECTION Bilateral 06/18/2023   Procedure: PELVIC LYMPH NODE DISSECTION;  Surgeon: Vanna Scotland, MD;  Location: [facility] ORS;  Service: Urology;  Laterality: Bilateral;   PROSTATE BIOPSY     ROBOT ASSISTED LAPAROSCOPIC RADICAL PROSTATECTOMY N/A 06/18/2023   Procedure: XI ROBOTIC ASSISTED LAPAROSCOPIC RADICAL PROSTATECTOMY;  Surgeon: Vanna Scotland, MD;  Location: [facility];  Service: Urology;  Laterality: N/A;   TONSILLECTOMY     age 44   Patient Active Problem List   Diagnosis Date Noted   Fatigue 04/06/2023   Knee pain, bilateral 03/19/2023   Compression fracture of spine (HCC) 03/19/2023   Anxious mood 03/19/2023   B12 deficiency 03/19/2023   Hypertension 03/03/2023   Gastric ulcer 03/03/2023   Alcohol use disorder 03/02/2023   Hyponatremia 03/02/2023   Asthma, chronic 03/02/2023   Gout 03/02/2023    Prostate cancer (HCC) 03/02/2023    PCP: Elberta Fortis, MD  REFERRING PROVIDER: Apolinar Junes MD   REFERRING DIAG: Prostate Cancer   Rationale for Evaluation and Treatment Rehabilitation  THERAPY DIAG:  Sacrococcygeal disorders, not elsewhere classified  Other abnormalities of gait and mobility  Other lack of coordination  Other low back pain  ONSET DATE: Prostate Surgery RALP 06/18/23   SUBJECTIVE:  SUBJECTIVE STATEMENT TODAY: Pt noticed he is only getting up at night to pee two times a night.   Radiating back pain is no longer an issue the past 3 weeks out 4 four weeks.    SUBJECTIVE STATEMENT 07/03/23 Eval: 1) urinary leakage s/p 2 weeks RALP: pt changes briefs every hour.  Pt does not change at night. Pt can sense the urge to go and able to make it before leakage.  [place] with coughing, laughing, sneezing, sit to stand, bending, standing to put on briefs. Pt normally do not drink any water, 2 L soda, 28 oz of coffee,   2) testicular pain:  sensitive to the touch, pressure there . It hurts when he has to hold it to get the the toilet before leakage and with sitting 5-6/10 pain   3) CLBP with R thigh numbness, L lateral thigh: 5/10     Compression Fx at low and midback   PERTINENT HISTORY:  Quit smoking in May 12, 2023 , recovering alcoholic and living at recovering home   PAIN:  Are you having pain? Yes: see above    PRECAUTIONS: lifting restrictions for 6 weeks ( pt currently at 2 weeks post op)   WEIGHT BEARING RESTRICTIONS: No  FALLS:  Has patient fallen in last 6 months? No  LIVING ENVIRONMENT: Lives with: recovering house ( recovering alcoholic)  Lives in: Group home Stairs: moved to 1st floor after surgery   OCCUPATION: prior to diasability status, pt worked [employer] representative ,  Engineer, maintenance (IT)   PLOF: Independent  PATIENT GOALS:  Have as much control as he can get  Improve LBP    OBJECTIVE:    [facility] PT Assessment - 08/01/23 1000       Observation/Other Assessments   Scoliosis levelled pelvis and shoulder      Other:   Other/ Comments logrolling tehcnique required cues      Other:   Other/Comments sitting posture without cues , no more slumped sitting      Palpation   Spinal mobility occiput B deviated to L C3-5 , STM/MWM at paraspinals, interspinals, intercostals by R scapula , upper trap/ SCM             [facility] Adult PT Treatment/Exercise - 08/01/23 1000       Ambulation/Gait   Gait Comments 1.25 m/s , reciporcal gait but limited armswings. thoracic rotation      Therapeutic Activites    Therapeutic Activities Other Therapeutic Activities    Other Therapeutic Activities cued for logrolling and reiterated importance of this body mechanics to minmize straining back and pelvic floor      Neuro Re-ed    Neuro Re-ed Details  excessive cues for deep core LKC stability      Modalities   Modalities Moist Heat      Moist Heat Therapy   Number Minutes Moist Heat 5 Minutes    Moist Heat Location Cervical      Manual Therapy   Manual therapy comments distraction at occiput B, medial glide at L C3-5 to realign, STM/MWM at paraspinals, interspinals, intercostals by R scapula , upper trap/ SCM/                  HOME EXERCISE PROGRAM: See pt instruction section    ASSESSMENT:  CLINICAL IMPRESSION:            Improvements this week:  Pt noticed he is only getting up at night to pee two times a night compared to 2-4  x after surgery.   Radiating back pain is no longer an issue the past 3 weeks out 4 four weeks.   Pt increased water intake. Pt decreased soda. Pt is switching to decaf coffee.      Pt reported not being consistent with HEP. Pt required education on compliance and he voiced more clarity of the HEP after reviewing  them.     Pt continues to require manual Tx to minimize R thoracic/ shoulder / cervical tensions.  Pt demo'd improved diaphragmatic excursion which deems him ready for deep core training today. Pt required cues for deep core training. Added deep core into HEP and pt voiced understanding the HEP. Pt reported feeling less pressure and urge to pee after practicing deep core HEP.     Plan to address pelvic floor issues once pelvis and spine are realigned to yield better outcomes.    By addressing realignment of spine will help to optimize IAP system for improved pelvic floor function.    Pt benefits from skilled PT.    OBJECTIVE IMPAIRMENTS decreased activity tolerance, decreased coordination, decreased endurance, decreased mobility, difficulty walking, decreased ROM, decreased strength, decreased safety awareness, hypomobility, increased muscle spasms, impaired flexibility, improper body mechanics, postural dysfunction, and pain. scar restrictions   ACTIVITY LIMITATIONS  self-care,  sleep, home chores, work tasks    PARTICIPATION LIMITATIONS:  community, gym activities    PERSONAL FACTORS        are also affecting patient's functional outcome.    REHAB POTENTIAL: Good   CLINICAL DECISION MAKING: Evolving/moderate complexity   EVALUATION COMPLEXITY: Moderate    PATIENT EDUCATION:    Education details: Showed pt anatomy images. Explained muscles attachments/ connection, physiology of deep core system/ spinal- thoracic-pelvis-lower kinetic chain as they relate to pt's presentation, Sx, and past Hx. Explained what and how these areas of deficits need to be restored to balance and function    See Therapeutic activity / neuromuscular re-education section  Answered pt's questions.   Person educated: Patient Education method: Explanation, Demonstration, Tactile cues, Verbal cues, and Handouts Education comprehension: verbalized understanding, returned demonstration, verbal cues required,  tactile cues required, and needs further education     PLAN: PT FREQUENCY: 1x/week   PT DURATION: 10 weeks   PLANNED INTERVENTIONS: Therapeutic exercises, Therapeutic activity, Neuromuscular re-education, Balance training, Gait training, Patient/Family education, Self Care, Joint mobilization, Spinal mobilization, Moist heat, Taping, and Manual therapy, dry needling.   PLAN FOR NEXT SESSION: See clinical impression for plan     GOALS: Goals reviewed with patient? Yes  SHORT TERM GOALS: Target date: 07/31/2023    Pt will demo IND with HEP                    Baseline: Not IND            Goal status: INITIAL   LONG TERM GOALS: Target date: 09/11/2023    1.Pt will demo proper deep core coordination without chest breathing and optimal excursion of diaphragm/pelvic floor in order to promote spinal stability and pelvic floor function  Baseline: dyscoordination Goal status: INITIAL  2.  Pt will demo > 5 pt change on FOTO  to improve QOL and function  PFDI Urinary baseline -  Lower score = better function  Urinary Problem baseline-   Higher score = better function  Pelvic Pain baseline - Lower score = better function  Bowel  constipation baseline -   Higher score = better function  PFDI Bowel - Higher score =  better function  Lumber baseline  -   31pts Higher score = better function   Goal status: INITIAL  3.  Pt will demo proper body mechanics in against gravity tasks and ADLs  work tasks, fitness  to minimize straining pelvic floor / back                  Baseline: not IND, improper form that places strain on pelvic floor                Goal status:  Ongoing    4. Pt increases water intake from 0 to 24 fl oz of water , decrease from 2 L to  1 L soda ,  coffee from 28 to  14 fl oz in order to decrease urinary frequency and improve bladder health  Baseline: Pt normally do not drink any water, 2 L soda, 28 oz of coffee,  Goal status: MET    5. Pt will report <  2-3/10 pain at testicular pain with sitting with proper posture  in order to return to community events  Baseline: pt with pain sitting and delaying urine to make it to the toilet 5-6/10  Goal status: MET ( 08/01/23:  0--2/10 testicular pain)    6.  Pt will demo levelled pelvic girdle and shoulder height in order to progress to deep core strengthening HEP and restore mobility at spine, pelvis, gait, posture minimize falls, and improve balance   Baseline: R shoulder higher, L iliac crest, R sideflexion/ rotation caused Radiating R LBP  Goal status: MET   7. Pt will demo increased gait speed > 1.3 m/s in order to ambulate safely in community and return to fitness routine   Baseline: 1.08 m/s, L trunk lean,  excessive pelvic sway to L, no arm swing/ trunk rotation   Goal status: Partially Met ( 08/01/23: 1.25 m/s)      Mariane Masters, PT 08/01/2023, 9:22 AM

## 2023-08-08 ENCOUNTER — Ambulatory Visit: Payer: Medicare HMO | Admitting: Physical Therapy

## 2023-08-08 DIAGNOSIS — M533 Sacrococcygeal disorders, not elsewhere classified: Secondary | ICD-10-CM | POA: Diagnosis not present

## 2023-08-08 DIAGNOSIS — M5459 Other low back pain: Secondary | ICD-10-CM | POA: Diagnosis not present

## 2023-08-08 DIAGNOSIS — R2689 Other abnormalities of gait and mobility: Secondary | ICD-10-CM

## 2023-08-08 DIAGNOSIS — R278 Other lack of coordination: Secondary | ICD-10-CM

## 2023-08-08 NOTE — Therapy (Signed)
OUTPATIENT PHYSICAL THERAPY TREATMENT    Patient Name: Frank Terry MRN: [id number] DOB:[birth year], 57 y.o., male Today's Date: 08/08/2023   PT End of Session - 08/08/23 0922     Visit Number 5    Number of Visits 10    Date for PT Re-Evaluation 09/11/23    PT Start Time 0917    PT Stop Time 1000    PT Time Calculation (min) 43 min    Activity Tolerance Patient tolerated treatment well;No increased pain    Behavior During Therapy WFL for tasks assessed/performed             Past Medical History:  Diagnosis Date   Alcohol use disorder    pt currently lives at the [facility]   Allergy    Anxiety    Arthritis    Asthma    well controlled   Compression fracture of spine (HCC)    Congenital absence of one kidney    pt has right kidney   Depression    Gastric ulcer    GERD (gastroesophageal reflux disease)    no meds   Gout    HTN (hypertension)    Hyponatremia    Osteoporosis    Prostate cancer (HCC)    Ulcer    Vasitis 05/2023   Past Surgical History:  Procedure Laterality Date   COLONOSCOPY WITH ESOPHAGOGASTRODUODENOSCOPY (EGD)     PELVIC LYMPH NODE DISSECTION Bilateral 06/18/2023   Procedure: PELVIC LYMPH NODE DISSECTION;  Surgeon: Vanna Scotland, MD;  Location: [facility];  Service: Urology;  Laterality: Bilateral;   PROSTATE BIOPSY     ROBOT ASSISTED LAPAROSCOPIC RADICAL PROSTATECTOMY N/A 06/18/2023   Procedure: XI ROBOTIC ASSISTED LAPAROSCOPIC RADICAL PROSTATECTOMY;  Surgeon: Vanna Scotland, MD;  Location: [facility];  Service: Urology;  Laterality: N/A;   TONSILLECTOMY     age 37   Patient Active Problem List   Diagnosis Date Noted   Fatigue 04/06/2023   Knee pain, bilateral 03/19/2023   Compression fracture of spine (HCC) 03/19/2023   Anxious mood 03/19/2023   B12 deficiency 03/19/2023   Hypertension 03/03/2023   Gastric ulcer 03/03/2023   Alcohol use disorder 03/02/2023   Hyponatremia 03/02/2023   Asthma, chronic 03/02/2023   Gout 03/02/2023    Prostate cancer (HCC) 03/02/2023    PCP: Elberta Fortis, MD  REFERRING PROVIDER: Apolinar Junes MD   REFERRING DIAG: Prostate Cancer   Rationale for Evaluation and Treatment Rehabilitation  THERAPY DIAG:  Sacrococcygeal disorders, not elsewhere classified  Other abnormalities of gait and mobility  Other lack of coordination  Other low back pain  ONSET DATE: Prostate Surgery RALP 06/18/23   SUBJECTIVE:  SUBJECTIVE STATEMENT TODAY: Pt says his 1 L soda now lasts for the whole week instead of him drinking per day.  People tell him he looks healthier.  Pt decreased to wearing 3 pads instead of 6-8 pads. Pt continue to do all HEP   SUBJECTIVE STATEMENT 07/03/23 Eval: 1) urinary leakage s/p 2 weeks RALP: pt changes briefs every hour.  Pt does not change at night. Pt can sense the urge to go and able to make it before leakage.  [place] with coughing, laughing, sneezing, sit to stand, bending, standing to put on briefs. Pt normally do not drink any water, 2 L soda, 28 oz of coffee,   2) testicular pain:  sensitive to the touch, pressure there . It hurts when he has to hold it to get the the toilet before leakage and with sitting 5-6/10 pain   3) CLBP with R thigh numbness, L lateral thigh: 5/10     Compression Fx at low and midback   PERTINENT HISTORY:  Quit smoking in May 12, 2023 , recovering alcoholic and living at recovering home   PAIN:  Are you having pain? Yes: see above    PRECAUTIONS: lifting restrictions for 6 weeks ( pt currently at 2 weeks post op)   WEIGHT BEARING RESTRICTIONS: No  FALLS:  Has patient fallen in last 6 months? No  LIVING ENVIRONMENT: Lives with: recovering house ( recovering alcoholic)  Lives in: Group home Stairs: moved to 1st floor after surgery   OCCUPATION:  prior to diasability status, pt worked [employer] representative , Engineer, maintenance (IT)   PLOF: Independent  PATIENT GOALS:  Have as much control as he can get  Improve LBP    OBJECTIVE:    [facility] PT Assessment - 08/08/23 0925       Observation/Other Assessments   Scoliosis levelled pelvis, R shoulder higher , more upright posture, more healthy tone to skin, oxygenated hue      Palpation   Palpation comment tightness at lower R rib, C/T junction, T3 deviated R             [facility] Adult PT Treatment/Exercise - 08/08/23 0925       Ambulation/Gait   Gait Comments 1.42 m/s with limited R shoulder posterior rotation, pelvic with more reciprocal gait      Neuro Re-ed    Neuro Re-ed Details  cued for neck stretch to realign C/T spine      Modalities   Modalities Moist Heat      Moist Heat Therapy   Number Minutes Moist Heat 5 Minutes    Moist Heat Location Cervical      Manual Therapy   Manual therapy comments STM/MWM at R lower ribs, intercostals, T3 medial glide                HOME EXERCISE PROGRAM: See pt instruction section    ASSESSMENT:  CLINICAL IMPRESSION:            Improvements this week:  Radiating back pain is no longer an issue the past month  Pt says his 1 L soda now lasts for the whole week instead of him drinking per day. People tell him he looks healthier. Pt decreased to wearing 3 pads instead of 6-8 pads. Pt continue to do all HEP   Pt demo'd improved diaphragmatic excursion and more levelled shoulders post Tx.  Added neck stretch to address scoliotic curve.     Plan to address pelvic floor issues once pelvis and spine are realigned  to yield better outcomes.    By addressing realignment of spine will help to optimize IAP system for improved pelvic floor function.    Pt benefits from skilled PT.    OBJECTIVE IMPAIRMENTS decreased activity tolerance, decreased coordination, decreased endurance, decreased mobility, difficulty walking, decreased ROM,  decreased strength, decreased safety awareness, hypomobility, increased muscle spasms, impaired flexibility, improper body mechanics, postural dysfunction, and pain. scar restrictions   ACTIVITY LIMITATIONS  self-care,  sleep, home chores, work tasks    PARTICIPATION LIMITATIONS:  community, gym activities    PERSONAL FACTORS        are also affecting patient's functional outcome.    REHAB POTENTIAL: Good   CLINICAL DECISION MAKING: Evolving/moderate complexity   EVALUATION COMPLEXITY: Moderate    PATIENT EDUCATION:    Education details: Showed pt anatomy images. Explained muscles attachments/ connection, physiology of deep core system/ spinal- thoracic-pelvis-lower kinetic chain as they relate to pt's presentation, Sx, and past Hx. Explained what and how these areas of deficits need to be restored to balance and function    See Therapeutic activity / neuromuscular re-education section  Answered pt's questions.   Person educated: Patient Education method: Explanation, Demonstration, Tactile cues, Verbal cues, and Handouts Education comprehension: verbalized understanding, returned demonstration, verbal cues required, tactile cues required, and needs further education     PLAN: PT FREQUENCY: 1x/week   PT DURATION: 10 weeks   PLANNED INTERVENTIONS: Therapeutic exercises, Therapeutic activity, Neuromuscular re-education, Balance training, Gait training, Patient/Family education, Self Care, Joint mobilization, Spinal mobilization, Moist heat, Taping, and Manual therapy, dry needling.   PLAN FOR NEXT SESSION: See clinical impression for plan     GOALS: Goals reviewed with patient? Yes  SHORT TERM GOALS: Target date: 07/31/2023    Pt will demo IND with HEP                    Baseline: Not IND            Goal status: INITIAL   LONG TERM GOALS: Target date: 09/11/2023    1.Pt will demo proper deep core coordination without chest breathing and optimal excursion of  diaphragm/pelvic floor in order to promote spinal stability and pelvic floor function  Baseline: dyscoordination Goal status: INITIAL  2.  Pt will demo > 5 pt change on FOTO  to improve QOL and function  PFDI Urinary baseline -  Lower score = better function  Urinary Problem baseline-   Higher score = better function  Pelvic Pain baseline - Lower score = better function  Bowel  constipation baseline -   Higher score = better function  PFDI Bowel - Higher score = better function  Lumber baseline  -   31pts Higher score = better function   Goal status: INITIAL  3.  Pt will demo proper body mechanics in against gravity tasks and ADLs  work tasks, fitness  to minimize straining pelvic floor / back                  Baseline: not IND, improper form that places strain on pelvic floor                Goal status:  Ongoing    4. Pt increases water intake from 0 to 24 fl oz of water , decrease from 2 L to  1 L soda ,  coffee from 28 to  14 fl oz in order to decrease urinary frequency and improve bladder health  Baseline: Pt normally  do not drink any water, 2 L soda, 28 oz of coffee,  Goal status: MET    5. Pt will report < 2-3/10 pain at testicular pain with sitting with proper posture  in order to return to community events  Baseline: pt with pain sitting and delaying urine to make it to the toilet 5-6/10  Goal status: MET ( 08/01/23:  0--2/10 testicular pain)    6.  Pt will demo levelled pelvic girdle and shoulder height in order to progress to deep core strengthening HEP and restore mobility at spine, pelvis, gait, posture minimize falls, and improve balance   Baseline: R shoulder higher, L iliac crest, R sideflexion/ rotation caused Radiating R LBP  Goal status: MET   7. Pt will demo increased gait speed > 1.3 m/s in order to ambulate safely in community and return to fitness routine   Baseline: 1.08 m/s, L trunk lean,  excessive pelvic sway to L, no arm swing/ trunk rotation    Goal status:  Met ( 08/01/23: 1.25 m/s) ( 1.42 m/s reciporcal pattern)      Mariane Masters, PT 08/08/2023, 9:23 AM

## 2023-08-08 NOTE — Patient Instructions (Signed)
Stretches :     _wall stretch Clock stretch with head turns :  stand perpendicular to the wall, L side to the wall Tilt head to wall  Place L palm at 7 o clock Chin tuck like you are looking into armpit Look at "[place] on giant map  Swivel head with chin tucked to look in upper corner of ceiling as if you are look at  [place] on giant map "   5 reps   Switch direction, R palm on wall at 5 o 'clock   Chin tuck like you are looking into armpit Look at "[place]  on giant map  Swivel head with chin tucked to look in upper corner of ceiling as if you are look at  [facility] on giant map "   10 reps

## 2023-08-10 ENCOUNTER — Ambulatory Visit (INDEPENDENT_AMBULATORY_CARE_PROVIDER_SITE_OTHER): Payer: Medicare HMO | Admitting: Student

## 2023-08-10 ENCOUNTER — Encounter: Payer: Self-pay | Admitting: Student

## 2023-08-10 VITALS — BP 138/91 | HR 72 | Ht 69.0 in | Wt 179.0 lb

## 2023-08-10 DIAGNOSIS — L989 Disorder of the skin and subcutaneous tissue, unspecified: Secondary | ICD-10-CM | POA: Diagnosis not present

## 2023-08-10 DIAGNOSIS — R6889 Other general symptoms and signs: Secondary | ICD-10-CM | POA: Diagnosis not present

## 2023-08-10 DIAGNOSIS — M810 Age-related osteoporosis without current pathological fracture: Secondary | ICD-10-CM | POA: Insufficient documentation

## 2023-08-10 DIAGNOSIS — J309 Allergic rhinitis, unspecified: Secondary | ICD-10-CM | POA: Insufficient documentation

## 2023-08-10 MED ORDER — NAPROXEN 500 MG PO TABS: 500.0000 mg | ORAL_TABLET | Freq: Two times a day (BID) | ORAL | 0 refills | Status: DC

## 2023-08-10 NOTE — Progress Notes (Signed)
    SUBJECTIVE:   CHIEF COMPLAINT / HPI:   Frank Terry is a 57 y.o. male  presenting for new onset pain and bump over his right eyebrow.   Hx:  Previously present at least a year, couple of weeks ago noticed it starting to grow  With spot getting larger also noted HA and blurry vision.  Worse when touches it otherwise feels a constant ache  Pain radiated to his right temple  Noted sharp stabbing pain, worse in the morning  Blurry vision - all the time since pain has gotten worse  No loss of balance, falls, or hitting head recently  Tried Ibuprofen without relief, naproxen helps some  No personal hx of autoimmune disorder : sister with MS   PERTINENT  PMH / PSH: Reviewed and updated   OBJECTIVE:   BP (!) 138/91   Pulse 72   Ht 5\' 9"  (1.753 m)   Wt 179 lb (81.2 kg)   SpO2 98%   BMI 26.43 kg/m   Well-appearing, no acute distress Cardio: Regular rate, regular rhythm, no murmurs on exam. Pulm: Clear, no wheezing, no crackles. No increased work of breathing HEENT: pupils equal and reactive to light, normal accomodation, intact peripheral vision, TM clear bilaterally   Noted ~2 cm cystic-like lesion present in subdermal area above the right eyebrow that is tender to touch. Non-mobile. No pain at right temple or around the forehead.        08/10/2023    1:48 PM 05/04/2023    9:23 PM 04/06/2023   10:10 AM  PHQ9 SCORE ONLY  PHQ-9 Total Score 5 7 7       ASSESSMENT/PLAN:   Skin lesion Differential for presentation includes complex cyst, soft tissue mass, lipoma. Concerning for bony metastasis from known prostate cancer but reassured by recent PET scan and pain relief from NSAID. Will schedule in derm clinic for punch biopsy to further classify the lesion. Will also send in a short course of Naproxen for pain relief.      Glendale Chard, DO [facility]

## 2023-08-10 NOTE — Assessment & Plan Note (Signed)
Differential for presentation includes complex cyst, soft tissue mass, lipoma. Concerning for bony metastasis from known prostate cancer but reassured by recent PET scan and pain relief from NSAID. Will schedule in derm clinic for punch biopsy to further classify the lesion. Will also send in a short course of Naproxen for pain relief.

## 2023-08-10 NOTE — Patient Instructions (Signed)
It was great to see you today!   Today we addressed: I have refilled naproxen for your pain. Please go to the dermatology clinic to have a punch biopsy of the lesion.   Future Appointments  Date Time Provider Department Center  08/10/2023  2:20 PM Glendale Chard, DO [facility]  08/15/2023  9:30 AM Mariane Masters, PT [facility] None  08/16/2023  9:50 AM [facility]  08/23/2023  8:00 AM McGowan, Carollee Herter A, PA-C [facility] None  08/29/2023 10:15 AM Dayle Points, Shin-Yiing, PT [facility] None  09/05/2023  3:00 PM Mariane Masters, PT [facility] None  09/12/2023  1:30 PM Mariane Masters, PT [facility] None  09/19/2023  1:30 PM Mariane Masters, PT [facility] None  09/21/2023 11:15 AM [facility] None  09/25/2023 11:00 AM Vanna Scotland, MD [facility] None    Please arrive 15 minutes before your appointment to ensure smooth check in process.    Please call the clinic at [phone] if your symptoms worsen or you have any concerns.  Thank you for allowing me to participate in your care, Dr. Glendale Chard [facility]

## 2023-08-15 ENCOUNTER — Ambulatory Visit: Payer: Medicare HMO | Admitting: Physical Therapy

## 2023-08-15 DIAGNOSIS — R2689 Other abnormalities of gait and mobility: Secondary | ICD-10-CM

## 2023-08-15 DIAGNOSIS — R278 Other lack of coordination: Secondary | ICD-10-CM | POA: Diagnosis not present

## 2023-08-15 DIAGNOSIS — M533 Sacrococcygeal disorders, not elsewhere classified: Secondary | ICD-10-CM | POA: Diagnosis not present

## 2023-08-15 DIAGNOSIS — M5459 Other low back pain: Secondary | ICD-10-CM | POA: Diagnosis not present

## 2023-08-15 NOTE — Therapy (Signed)
OUTPATIENT PHYSICAL THERAPY TREATMENT    Patient Name: Frank Terry MRN: [id number] DOB:December 02, 1965, 57 y.o., male Today's Date: 08/15/2023   PT End of Session - 08/15/23 0942     Visit Number 6    Number of Visits 10    Date for PT Re-Evaluation 09/11/23    PT Start Time 0933    PT Stop Time 1015    PT Time Calculation (min) 42 min    Activity Tolerance Patient tolerated treatment well;No increased pain    Behavior During Therapy WFL for tasks assessed/performed             Past Medical History:  Diagnosis Date   Alcohol use disorder    pt currently lives at the [facility]   Allergy    Anxiety    Arthritis    Asthma    well controlled   Compression fracture of spine (HCC)    Congenital absence of one kidney    pt has right kidney   Depression    Gastric ulcer    GERD (gastroesophageal reflux disease)    no meds   Gout    HTN (hypertension)    Hyponatremia    Osteoporosis    Prostate cancer (HCC)    Ulcer    Vasitis 05/2023   Past Surgical History:  Procedure Laterality Date   COLONOSCOPY WITH ESOPHAGOGASTRODUODENOSCOPY (EGD)     PELVIC LYMPH NODE DISSECTION Bilateral 06/18/2023   Procedure: PELVIC LYMPH NODE DISSECTION;  Surgeon: Vanna Scotland, MD;  Location: [facility] ORS;  Service: Urology;  Laterality: Bilateral;   PROSTATE BIOPSY     ROBOT ASSISTED LAPAROSCOPIC RADICAL PROSTATECTOMY N/A 06/18/2023   Procedure: XI ROBOTIC ASSISTED LAPAROSCOPIC RADICAL PROSTATECTOMY;  Surgeon: Vanna Scotland, MD;  Location: [facility] OR[facility];  Service: Urology;  Laterality: N/A;   TONSILLECTOMY     age 61   Patient Active Problem List   Diagnosis Date Noted   Allergic rhinitis 08/10/2023   Osteoporosis 08/10/2023   Skin lesion 08/10/2023   Fatigue 04/06/2023   Knee pain, bilateral 03/19/2023   Compression fracture of spine (HCC) 03/19/2023   Anxious mood 03/19/2023   B12 deficiency 03/19/2023   Hypertension 03/03/2023   Gastric ulcer 03/03/2023   Hyponatremia 03/02/2023    Asthma, chronic 03/02/2023   Gout 03/02/2023   Prostate cancer (HCC) 03/02/2023   Spondylosis of lumbar region without myelopathy or radiculopathy 03/05/2020   Diverticulosis of intestine with bleeding 01/28/2016   Congenital absence of left kidney 01/28/2016    PCP: Elberta Fortis, MD  REFERRING PROVIDER: Apolinar Junes MD   REFERRING DIAG: Prostate Cancer   Rationale for Evaluation and Treatment Rehabilitation  THERAPY DIAG:  Sacrococcygeal disorders, not elsewhere classified  Other abnormalities of gait and mobility  Other low back pain  Other lack of coordination  ONSET DATE: Prostate Surgery RALP 06/18/23   SUBJECTIVE:  SUBJECTIVE STATEMENT TODAY: Pt  has been doing his new HEP from last week  without difficulty   SUBJECTIVE STATEMENT 07/03/23 Eval: 1) urinary leakage s/p 2 weeks RALP: pt changes briefs every hour.  Pt does not change at night. Pt can sense the urge to go and able to make it before leakage.  [place] with coughing, laughing, sneezing, sit to stand, bending, standing to put on briefs. Pt normally do not drink any water, 2 L soda, 28 oz of coffee,   2) testicular pain:  sensitive to the touch, pressure there . It hurts when he has to hold it to get the the toilet before leakage and with sitting 5-6/10 pain   3) CLBP with R thigh numbness, L lateral thigh: 5/10     Compression Fx at low and midback   PERTINENT HISTORY:  Quit smoking in May 12, 2023 , recovering alcoholic and living at recovering home   PAIN:  Are you having pain? Yes: see above    PRECAUTIONS: lifting restrictions for 6 weeks ( pt currently at 2 weeks post op)   WEIGHT BEARING RESTRICTIONS: No  FALLS:  Has patient fallen in last 6 months? No  LIVING ENVIRONMENT: Lives with: recovering house (  recovering alcoholic)  Lives in: Group home Stairs: moved to 1st floor after surgery   OCCUPATION: prior to diasability status, pt worked [employer] representative , Engineer, maintenance (IT)   PLOF: Independent  PATIENT GOALS:  Have as much control as he can get  Improve LBP    OBJECTIVE:     [facility] PT Assessment - 08/15/23 0944       Observation/Other Assessments   Scoliosis R shoulder less low, Pelvis levelled , more upright  posture      Coordination   Coordination and Movement Description poor multidifis coordination             [facility] Adult PT Treatment/Exercise - 08/15/23 1038       Therapeutic Activites    Other Therapeutic Activities showed anatomy images , explained kegels for quick contractions      Neuro Re-ed    Neuro Re-ed Details  cued for multidifis and quick pelvic floor              HOME EXERCISE PROGRAM: See pt instruction section    ASSESSMENT:  CLINICAL IMPRESSION:               Pt progressed to quick contractions of pelvic floor with cues for less ab overuse. Pt achieved 3 reps.    Pt also progressed to multidifis strenghtening in seated position with cues for diassociation of trunk and pelvis. Pt also was cued for standing glut strenghtening with scapulothoracic strengthening HEP to further     promote his improved upright posture.    These new HEP will help further with continence.     Plan to progress to endurance contraction of kegels at upcoming session  Pt benefits from skilled PT.    OBJECTIVE IMPAIRMENTS decreased activity tolerance, decreased coordination, decreased endurance, decreased mobility, difficulty walking, decreased ROM, decreased strength, decreased safety awareness, hypomobility, increased muscle spasms, impaired flexibility, improper body mechanics, postural dysfunction, and pain. scar restrictions   ACTIVITY LIMITATIONS  self-care,  sleep, home chores, work tasks    PARTICIPATION LIMITATIONS:  community, gym activities     PERSONAL FACTORS        are also affecting patient's functional outcome.    REHAB POTENTIAL: Good   CLINICAL DECISION MAKING: Evolving/moderate complexity  EVALUATION COMPLEXITY: Moderate    PATIENT EDUCATION:    Education details: Showed pt anatomy images. Explained muscles attachments/ connection, physiology of deep core system/ spinal- thoracic-pelvis-lower kinetic chain as they relate to pt's presentation, Sx, and past Hx. Explained what and how these areas of deficits need to be restored to balance and function    See Therapeutic activity / neuromuscular re-education section  Answered pt's questions.   Person educated: Patient Education method: Explanation, Demonstration, Tactile cues, Verbal cues, and Handouts Education comprehension: verbalized understanding, returned demonstration, verbal cues required, tactile cues required, and needs further education     PLAN: PT FREQUENCY: 1x/week   PT DURATION: 10 weeks   PLANNED INTERVENTIONS: Therapeutic exercises, Therapeutic activity, Neuromuscular re-education, Balance training, Gait training, Patient/Family education, Self Care, Joint mobilization, Spinal mobilization, Moist heat, Taping, and Manual therapy, dry needling.   PLAN FOR NEXT SESSION: See clinical impression for plan     GOALS: Goals reviewed with patient? Yes  SHORT TERM GOALS: Target date: 07/31/2023    Pt will demo IND with HEP                    Baseline: Not IND            Goal status: INITIAL   LONG TERM GOALS: Target date: 09/11/2023    1.Pt will demo proper deep core coordination without chest breathing and optimal excursion of diaphragm/pelvic floor in order to promote spinal stability and pelvic floor function  Baseline: dyscoordination Goal status: INITIAL  2.  Pt will demo > 5 pt change on FOTO  to improve QOL and function  PFDI Urinary baseline -  Lower score = better function  Urinary Problem baseline-   Higher score = better  function  Pelvic Pain baseline - Lower score = better function  Bowel  constipation baseline -   Higher score = better function  PFDI Bowel - Higher score = better function  Lumber baseline  -   31pts Higher score = better function   Goal status: INITIAL  3.  Pt will demo proper body mechanics in against gravity tasks and ADLs  work tasks, fitness  to minimize straining pelvic floor / back                  Baseline: not IND, improper form that places strain on pelvic floor                Goal status:  Ongoing    4. Pt increases water intake from 0 to 24 fl oz of water , decrease from 2 L to  1 L soda ,  coffee from 28 to  14 fl oz in order to decrease urinary frequency and improve bladder health  Baseline: Pt normally do not drink any water, 2 L soda, 28 oz of coffee,  Goal status: MET    5. Pt will report < 2-3/10 pain at testicular pain with sitting with proper posture  in order to return to community events  Baseline: pt with pain sitting and delaying urine to make it to the toilet 5-6/10  Goal status: MET ( 08/01/23:  0--2/10 testicular pain)    6.  Pt will demo levelled pelvic girdle and shoulder height in order to progress to deep core strengthening HEP and restore mobility at spine, pelvis, gait, posture minimize falls, and improve balance   Baseline: R shoulder higher, L iliac crest, R sideflexion/ rotation caused Radiating R LBP  Goal status: MET  7. Pt will demo increased gait speed > 1.3 m/s in order to ambulate safely in community and return to fitness routine   Baseline: 1.08 m/s, L trunk lean,  excessive pelvic sway to L, no arm swing/ trunk rotation   Goal status:  Met ( 08/01/23: 1.25 m/s) ( 1.42 m/s reciporcal pattern)      Mariane Masters, PT 08/15/2023, 9:43 AM

## 2023-08-15 NOTE — Patient Instructions (Addendum)
  SKI against chair   In front of chair, knee against seat to line knee above ankle,  back foot hip width apart, push off through ballmounds,  opp arm up, thumbs up, chin tuck, shoulders down  Push off through ballmounds , step back to under hip width   3 min   __  Multifidis twist   Band is on doorknob: sit facing perpendicular to door , sit halfway towards front of chair, firm through 4 points of contact at buttocks and feet. Feet are placed hip with apart.   Twisting trunk without moving the hips and knees Hold band at the level of ribcage, elbows bent,shoulder blades roll back and down like squeezing a pencil under armpit   Exhale twist,.10-15 deg away from door without moving your hips/ knees, press more weight on the side of the sitting bones/ foot opp of your direction of turn as your counterweight. Continue to maintain equal weight through legs.  Keep knee unlocked.  30 reps   __   Morning and night   Mon Tue Wed Thurs Fri  Sat Sun   Find your anterior pelvic tilt  quick  Pelvic floor squeeze  3 reps           Deep core level 1 ( 10 breaths            Find your anterior pelvic tilt  quick  Pelvic floor squeeze  3 reps            Deep core level 2 ( 6 min)           Find your anterior pelvic tilt  quick  Pelvic floor squeeze  3 reps

## 2023-08-16 ENCOUNTER — Ambulatory Visit (INDEPENDENT_AMBULATORY_CARE_PROVIDER_SITE_OTHER): Payer: Medicare HMO | Admitting: Family Medicine

## 2023-08-16 VITALS — BP 134/82 | HR 68 | Wt 180.4 lb

## 2023-08-16 DIAGNOSIS — R222 Localized swelling, mass and lump, trunk: Secondary | ICD-10-CM | POA: Diagnosis not present

## 2023-08-16 DIAGNOSIS — R229 Localized swelling, mass and lump, unspecified: Secondary | ICD-10-CM | POA: Diagnosis not present

## 2023-08-16 DIAGNOSIS — R220 Localized swelling, mass and lump, head: Secondary | ICD-10-CM | POA: Insufficient documentation

## 2023-08-16 DIAGNOSIS — H538 Other visual disturbances: Secondary | ICD-10-CM

## 2023-08-16 DIAGNOSIS — L578 Other skin changes due to chronic exposure to nonionizing radiation: Secondary | ICD-10-CM | POA: Diagnosis not present

## 2023-08-16 NOTE — Assessment & Plan Note (Addendum)
Differentials include lipoma, malignancy Skin biopsy vs observation with imaging at some point discussed He agreed to skin biopsy today Informed consent signed - procedure risk discussed (infection, bleeding, trauma, metastasis if malignant) Biopsy completed Tylenol as needed for head pain  Skin Biopsy Procedure Note PRE-OP DIAGNOSIS: Skin nodules  POST-OP DIAGNOSIS: Same   PROCEDURE: skin biopsy  Performing Physician: Janit Pagan, MD, MPH  PROCEDURE:    Punch (Size 3mm)  The area surrounding the skin lesion was prepared and draped in the  usual sterile manner. The lesion was removed in the usual manner by the  biopsy method noted above. Hemostasis was assured. The patient tolerated  the procedure well.  Closure:    Drysol applied t stop bleeding followed by pressure dressing  Followup: The patient tolerated the procedure well without  complications.  Standard post-procedure care is explained and return  precautions are given.

## 2023-08-16 NOTE — Patient Instructions (Signed)

## 2023-08-16 NOTE — Assessment & Plan Note (Signed)
Similar to facial nodule Hence, biopsy not done for this lesion Consider imaging of both lesion ([place] vs CT scan) in the future if his biopsy is non-revealing I discussed surgical referral at some point

## 2023-08-16 NOTE — Progress Notes (Signed)
    SUBJECTIVE:   CHIEF COMPLAINT / HPI:   Skin nodules/Blurry vision/Headache: C/O a lump on the left side of his forehead which has increased in size in the last two months following his prostate surgery. Lump is associaed with dull aching pain about 5/10 in severity, which worsens to 8/10 in severity when pressure is applied to the lump. The pain/headache is behind the lump and does not radiate anywhere else. He ensorded associated intermittent blurry vision with his headache. He had a similar lump on his back, which started at the same time and with similar symptoms.   PERTINENT  PMH / PSH: PMhx reviewed  OBJECTIVE:   BP 134/82   Pulse 68   Wt 180 lb 6.4 oz (81.8 kg)   SpO2 100%   BMI 26.64 kg/m   Physical Exam Vitals reviewed.  Eyes:     Conjunctiva/sclera:     Right eye: Right conjunctiva is not injected. No hemorrhage.    Left eye: Left conjunctiva is not injected. No hemorrhage.    Comments: Small stye of his left lower eyelid  Cardiovascular:     Rate and Rhythm: Normal rate and regular rhythm.     Heart sounds: Normal heart sounds. No murmur heard. Pulmonary:     Effort: Pulmonary effort is normal. No respiratory distress.     Breath sounds: Normal breath sounds. No wheezing.  Skin:         Comments: Firm, non-erythematous, mildly tender, nodular mass of about 2 x 3 cm on his left frontal area, a few inches above his left eyebrow. A similar lesion of about the same size on his left mid-upper back.  Neurological:     General: No focal deficit present.      ASSESSMENT/PLAN:   Nodule of skin of head Differentials include lipoma, malignancy Skin biopsy vs observation with imaging at some point discussed He agreed to skin biopsy today Informed consent signed - procedure risk discussed (infection, bleeding, trauma, metastasis if malignant) Biopsy completed Tylenol as needed for head pain  Skin Biopsy Procedure Note PRE-OP DIAGNOSIS: Skin nodules  POST-OP  DIAGNOSIS: Same   PROCEDURE: skin biopsy  Performing Physician: Janit Pagan, MD, MPH  PROCEDURE:    Punch (Size 3mm)  The area surrounding the skin lesion was prepared and draped in the  usual sterile manner. The lesion was removed in the usual manner by the  biopsy method noted above. Hemostasis was assured. The patient tolerated  the procedure well.  Closure:    Drysol applied t stop bleeding followed by pressure dressing  Followup: The patient tolerated the procedure well without  complications.  Standard post-procedure care is explained and return  precautions are given.   Nodule of skin of back Similar to facial nodule Hence, biopsy not done for this lesion Consider imaging of both lesion ([place] vs CT scan) in the future if his biopsy is non-revealing I discussed surgical referral at some point  Blurry vision Described as related to his facial nodule I discussed referral to an ophthalmologist for further evaluation He agreed with the plan Referral placed     Janit Pagan, MD [facility]

## 2023-08-16 NOTE — Assessment & Plan Note (Signed)
Described as related to his facial nodule I discussed referral to an ophthalmologist for further evaluation He agreed with the plan Referral placed

## 2023-08-21 ENCOUNTER — Telehealth: Payer: Self-pay | Admitting: Family Medicine

## 2023-08-21 DIAGNOSIS — R220 Localized swelling, mass and lump, head: Secondary | ICD-10-CM

## 2023-08-21 DIAGNOSIS — R222 Localized swelling, mass and lump, trunk: Secondary | ICD-10-CM

## 2023-08-21 LAB — DERMATOLOGY PATHOLOGY

## 2023-08-21 NOTE — Telephone Encounter (Signed)
Skin biopsy result discussed. Deeper tissue biopsy +/_ excision biopsy with repeat pathology evaluation discussed.  The plan is to send him to Gen Surg for both head and back nodules. He agreed with the plan. He requested a referral to a surgeon in [place]. PCP f/u for further management and evaluation discussed. He will schedule f/u soon.

## 2023-08-22 NOTE — Progress Notes (Unsigned)
08/23/2023 8:14 AM   Frank Terry 07-Aug-1966 [id number]  Referring provider: Elberta Fortis, MD [address] [place],  [place] [address]  Urological history: 1.  Prostate cancer -PSA (06/2023) 0.1 -Robotic prostatectomy (05/2023) -pT3aN1 -Scheduled for follow-up in November  2. Erectile dysfunction -Contributing factors of age, smoking, prostate cancer, prostate surgery, hypertension, spondylosis of the lumbar spine, alcohol consumption, depression, anxiety and smoking -failed PDE5i's  Chief Complaint  Patient presents with   Follow-up   HPI: Frank Terry is a 57 y.o. male who presents today for trimix titration.   Previous records reviewed.   He did not bring all the requested supplies.   PMH: Past Medical History:  Diagnosis Date   Alcohol use disorder    pt currently lives at the [facility]   Allergy    Anxiety    Arthritis    Asthma    well controlled   Compression fracture of spine (HCC)    Congenital absence of one kidney    pt has right kidney   Depression    Gastric ulcer    GERD (gastroesophageal reflux disease)    no meds   Gout    HTN (hypertension)    Hyponatremia    Osteoporosis    Prostate cancer (HCC)    Ulcer    Vasitis 05/2023    Surgical History: Past Surgical History:  Procedure Laterality Date   COLONOSCOPY WITH ESOPHAGOGASTRODUODENOSCOPY (EGD)     PELVIC LYMPH NODE DISSECTION Bilateral 06/18/2023   Procedure: PELVIC LYMPH NODE DISSECTION;  Surgeon: Vanna Scotland, MD;  Location: [facility] ORS;  Service: Urology;  Laterality: Bilateral;   PROSTATE BIOPSY     ROBOT ASSISTED LAPAROSCOPIC RADICAL PROSTATECTOMY N/A 06/18/2023   Procedure: XI ROBOTIC ASSISTED LAPAROSCOPIC RADICAL PROSTATECTOMY;  Surgeon: Vanna Scotland, MD;  Location: [facility] OR[facility];  Service: Urology;  Laterality: N/A;   TONSILLECTOMY     age 57    Home Medications:  Allergies as of 08/23/2023       Reactions   Penicillins Rash        Medication List         Accurate as of August 23, 2023  8:14 AM. If you have any questions, ask your nurse or doctor.          acetaminophen 500 MG tablet Commonly known as: TYLENOL Take 1,000 mg by mouth every 6 (six) hours as needed.   albuterol 108 (90 Base) MCG/ACT inhaler Commonly known as: VENTOLIN HFA Inhale 2 puffs into the lungs every 6 (six) hours as needed for wheezing.   colchicine 0.6 MG tablet Take 0.6 mg by mouth as needed.   irbesartan 150 MG tablet Commonly known as: AVAPRO Take 1 tablet (150 mg total) by mouth daily.   naproxen 500 MG tablet Commonly known as: Naprosyn Take 1 tablet (500 mg total) by mouth 2 (two) times daily with a meal.   NONFORMULARY OR COMPOUNDED ITEM Trimix (30/1/10)-(Pap/Phent/PGE)  Test Dose  1ml vial   Qty #3 Refills 0  [facility] [phone] Fax [phone]   sildenafil 100 MG tablet Commonly known as: VIAGRA Take 1 tablet (100 mg total) by mouth daily as needed for erectile dysfunction.        Allergies:  Allergies  Allergen Reactions   Penicillins Rash    Family History: Family History  Problem Relation Age of Onset   Hypertension Mother    Lung cancer Mother    Osteoporosis Mother    Alcohol abuse Mother  Arthritis Mother    Cancer Mother    Heart disease Mother    Hypertension Father    Cancer Father        Abdominal   Heart attack Father    Hypertension Sister    Multiple sclerosis Sister     Social History:  reports that he has quit smoking. His smoking use included cigarettes. He started smoking about 40 years ago. He has quit using smokeless tobacco.  His smokeless tobacco use included chew. He reports that he does not currently use alcohol after a past usage of about 168.0 standard drinks of alcohol per week. He reports that he does not currently use drugs.  ROS: Pertinent ROS in HPI  Physical Exam: BP 122/79   Pulse 83   Constitutional:  Well nourished. Alert and oriented, No acute  distress. Neurologic: Grossly intact, no focal deficits, moving all 4 extremities. Psychiatric: Normal mood and affect.  Laboratory Data: Lab Results  Component Value Date   WBC 6.4 07/14/2023   HGB 15.2 07/14/2023   HCT 46.7 07/14/2023   MCV 98.1 07/14/2023   PLT 242 07/14/2023    Lab Results  Component Value Date   CREATININE 1.03 07/14/2023    Lab Results  Component Value Date   TSH 0.913 04/06/2023     Lab Results  Component Value Date   AST 19 07/14/2023   Lab Results  Component Value Date   ALT 19 07/14/2023    Urinalysis    Component Value Date/Time   COLORURINE STRAW (A) 07/14/2023 0905   APPEARANCEUR CLEAR (A) 07/14/2023 0905   APPEARANCEUR Clear 05/30/2023 0958   LABSPEC 1.005 07/14/2023 0905   PHURINE 5.0 07/14/2023 0905   GLUCOSEU NEGATIVE 07/14/2023 0905   HGBUR SMALL (A) 07/14/2023 0905   BILIRUBINUR NEGATIVE 07/14/2023 0905   BILIRUBINUR Negative 05/30/2023 0958   KETONESUR NEGATIVE 07/14/2023 0905   PROTEINUR NEGATIVE 07/14/2023 0905   NITRITE NEGATIVE 07/14/2023 0905   LEUKOCYTESUR NEGATIVE 07/14/2023 0905  I have reviewed the labs.   Pertinent Imaging: N/A  Assessment & Plan:   1. Erectile dysfunction - could not proceed with titration, will reschedule  2. Prostate cancer -has appointment with Dr. Apolinar Junes in November -Encouraged follow-up as patient has aggressive disease   Return for rescheduled .  These notes generated with voice recognition software. I apologize for typographical errors.  Cloretta Ned  [facility] [address]  [address] [place], [place] [address] [phone]

## 2023-08-23 ENCOUNTER — Ambulatory Visit: Payer: Medicare HMO | Admitting: Urology

## 2023-08-23 ENCOUNTER — Encounter: Payer: Self-pay | Admitting: Urology

## 2023-08-23 VITALS — BP 122/79 | HR 83

## 2023-08-23 DIAGNOSIS — Z5309 Procedure and treatment not carried out because of other contraindication: Secondary | ICD-10-CM

## 2023-08-23 DIAGNOSIS — N529 Male erectile dysfunction, unspecified: Secondary | ICD-10-CM

## 2023-08-23 DIAGNOSIS — C61 Malignant neoplasm of prostate: Secondary | ICD-10-CM

## 2023-08-23 DIAGNOSIS — R6889 Other general symptoms and signs: Secondary | ICD-10-CM | POA: Diagnosis not present

## 2023-08-28 NOTE — Telephone Encounter (Signed)
Patient calls nurse line checking the status of [facility] Referral.   Advised referrals can take up to 2 weeks to be processed. Referral was placed on 10/1.  Will forward to referral coordinator for an update.   He would like to go somewhere in [facility] if possible.

## 2023-08-29 ENCOUNTER — Ambulatory Visit: Payer: Medicare HMO | Admitting: Physical Therapy

## 2023-08-29 NOTE — Telephone Encounter (Signed)
Called patient and provided with referral information.   Patient appreciative.   Veronda Prude, RN

## 2023-08-29 NOTE — Telephone Encounter (Signed)
Referral sent to:  [facility] [address] [place],  [place]  [address] Main: [phone] Fax: [phone]  Thank you, Melvenia Beam

## 2023-08-30 ENCOUNTER — Ambulatory Visit: Payer: Medicare HMO | Admitting: Urology

## 2023-08-31 ENCOUNTER — Encounter: Payer: Medicare HMO | Admitting: Physical Therapy

## 2023-09-03 ENCOUNTER — Telehealth: Payer: Medicare HMO | Admitting: Physician Assistant

## 2023-09-03 DIAGNOSIS — B029 Zoster without complications: Secondary | ICD-10-CM | POA: Diagnosis not present

## 2023-09-03 MED ORDER — GABAPENTIN 100 MG PO CAPS
100.0000 mg | ORAL_CAPSULE | Freq: Three times a day (TID) | ORAL | 0 refills | Status: DC
Start: 2023-09-03 — End: 2023-09-11

## 2023-09-03 MED ORDER — VALACYCLOVIR HCL 1 G PO TABS: 1000.0000 mg | ORAL_TABLET | Freq: Three times a day (TID) | ORAL | 0 refills | Status: AC

## 2023-09-03 NOTE — Progress Notes (Signed)
Virtual Visit Consent   Frank Terry, you are scheduled for a virtual visit with a [facility] provider today. Just as with appointments in the office, your consent must be obtained to participate. Your consent will be active for this visit and any virtual visit you may have with one of our providers in the next 365 days. If you have a MyChart account, a copy of this consent can be sent to you electronically.  As this is a virtual visit, video technology does not allow for your provider to perform a traditional examination. This may limit your provider's ability to fully assess your condition. If your provider identifies any concerns that need to be evaluated in person or the need to arrange testing (such as labs, EKG, etc.), we will make arrangements to do so. Although advances in technology are sophisticated, we cannot ensure that it will always work on either your end or our end. If the connection with a video visit is poor, the visit may have to be switched to a telephone visit. With either a video or telephone visit, we are not always able to ensure that we have a secure connection.  By engaging in this virtual visit, you consent to the provision of healthcare and authorize for your insurance to be billed (if applicable) for the services provided during this visit. Depending on your insurance coverage, you may receive a charge related to this service.  I need to obtain your verbal consent now. Are you willing to proceed with your visit today? Frank Terry has provided verbal consent on 09/03/2023 for a virtual visit (video or telephone). Margaretann Loveless, PA-C  Date: 09/03/2023 8:28 AM  Virtual Visit via Video Note   I, Margaretann Loveless, connected with  Frank Terry  ([id number], [birth year]) on 09/03/23 at  8:30 AM EDT by a video-enabled telemedicine application and verified that I am speaking with the correct person using two identifiers.  Location: Patient: Virtual Visit Location  Patient: Home Provider: Virtual Visit Location Provider: Home Office   I discussed the limitations of evaluation and management by telemedicine and the availability of in person appointments. The patient expressed understanding and agreed to proceed.    History of Present Illness: [name] is a 57 y.o. who identifies as a male who was assigned male at birth, and is being seen today for possible shingles.  HPI: Rash This is a new problem. The current episode started 1 to 4 weeks ago (pain started last week in low back. Then noticed some stinging pain. Rash came out yesterday). The problem is unchanged. The affected locations include the back and groin (right). The rash is characterized by blistering, pain, redness and burning. He was exposed to nothing. Associated symptoms include fatigue. Pertinent negatives include no fever. Treatments tried: advil. The treatment provided no relief. His past medical history is significant for varicella.  Has not been vaccinated against shingles.  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 (Patient not taking: Reported on 08/16/2023), Disp: , Rfl:    albuterol (VENTOLIN HFA) 108 (90 Base) MCG/ACT inhaler, Inhale 2 puffs into the lungs every 6 (six) hours as needed for wheezing. (Patient not taking: Reported on 08/16/2023), Disp: 6.7 g, Rfl: 0   colchicine 0.6 MG tablet, Take 0.6 mg by mouth as needed., Disp: , Rfl:    irbesartan (AVAPRO) 150 MG tablet, Take 1 tablet (150 mg total) by mouth daily. (Patient not taking: Reported on 08/23/2023), Disp: 90 tablet, Rfl: 1   naproxen (NAPROSYN) 500 MG tablet, Take 1 tablet (500 mg total) by mouth 2 (two) times daily with a meal. (Patient not taking: Reported on 08/16/2023), Disp: 30 tablet, Rfl: 0   NONFORMULARY OR COMPOUNDED ITEM, Trimix (30/1/10)-(Pap/Phent/PGE)  Test Dose  1ml vial   Qty #3 Refills 0  [facility] [phone] Fax [phone], Disp: 3 each, Rfl: 0   sildenafil (VIAGRA) 100 MG tablet, Take 1 tablet (100 mg total) by mouth daily as needed for erectile dysfunction., Disp: 30 tablet, Rfl: 11  Observations/Objective: Patient is well-developed, well-nourished in no acute distress.  Resting comfortably at home.  Head is normocephalic, atraumatic.  No labored breathing.  Speech is clear and coherent with logical content.  Patient is alert and oriented at baseline.    Assessment and Plan: There are no diagnoses linked to this encounter. - Shingles suspected - Valtrex prescribed - Gabapentin for nerve pain - Can continue tylenol and ibuprofen as needed for pain - Epsom salt soaks, warm compresses - Limit picking, scratching - Seek in person evaluation if not improving or worsens  Follow Up Instructions: I discussed the assessment and treatment plan with the patient. The patient was provided an opportunity to ask questions and all were answered. The patient agreed with the plan and demonstrated an understanding of the instructions.  A copy of instructions were sent to  the patient via MyChart unless otherwise noted below.   The patient was advised to call back or seek an in-person evaluation if the symptoms worsen or if the condition fails to improve as anticipated.    Margaretann Loveless, PA-C

## 2023-09-03 NOTE — Patient Instructions (Signed)
Frank Terry, thank you for joining Margaretann Loveless, PA-C for today's virtual visit.  While this provider is not your primary care provider (PCP), if your PCP is located in our provider database this encounter information will be shared with them immediately following your visit.   A [facility] MyChart account gives you access to today's visit and all your visits, tests, and labs performed at [facility] " click here if you don't have a [facility] MyChart account or go to [email or web address]  Consent: (Patient) Frank Terry provided verbal consent for this virtual visit at the beginning of the encounter.  Current Medications:  Current Outpatient Medications:    gabapentin (NEURONTIN) 100 MG capsule, Take 1 capsule (100 mg total) by mouth 3 (three) times daily., Disp: 30 capsule, Rfl: 0   valACYclovir (VALTREX) 1000 MG tablet, Take 1 tablet (1,000 mg total) by mouth 3 (three) times daily for 7 days., Disp: 21 tablet, Rfl: 0   acetaminophen (TYLENOL) 500 MG tablet, Take 1,000 mg by mouth every 6 (six) hours as needed. (Patient not taking: Reported on 08/16/2023), Disp: , Rfl:    albuterol (VENTOLIN HFA) 108 (90 Base) MCG/ACT inhaler, Inhale 2 puffs into the lungs every 6 (six) hours as needed for wheezing. (Patient not taking: Reported on 08/16/2023), Disp: 6.7 g, Rfl: 0   colchicine 0.6 MG tablet, Take 0.6 mg by mouth as needed., Disp: , Rfl:    irbesartan (AVAPRO) 150 MG tablet, Take 1 tablet (150 mg total) by mouth daily. (Patient not taking: Reported on 08/23/2023), Disp: 90 tablet, Rfl: 1   naproxen (NAPROSYN) 500 MG tablet, Take 1 tablet (500 mg total) by mouth 2 (two) times daily with a meal. (Patient not taking: Reported on 08/16/2023), Disp: 30 tablet, Rfl: 0   NONFORMULARY OR COMPOUNDED ITEM, Trimix (30/1/10)-(Pap/Phent/PGE)  Test Dose  1ml vial   Qty #3 Refills 0  [facility] [phone] Fax [phone], Disp: 3 each, Rfl: 0   sildenafil (VIAGRA) 100 MG  tablet, Take 1 tablet (100 mg total) by mouth daily as needed for erectile dysfunction., Disp: 30 tablet, Rfl: 11   Medications ordered in this encounter:  Meds ordered this encounter  Medications   valACYclovir (VALTREX) 1000 MG tablet    Sig: Take 1 tablet (1,000 mg total) by mouth 3 (three) times daily for 7 days.    Dispense:  21 tablet    Refill:  0    Order Specific Question:   Supervising Provider    Answer:   Merrilee Jansky [[id number]]   gabapentin (NEURONTIN) 100 MG capsule    Sig: Take 1 capsule (100 mg total) by mouth 3 (three) times daily.    Dispense:  30 capsule    Refill:  0    Order Specific Question:   Supervising Provider    Answer:   Merrilee Jansky [id number]     *If you need refills on other medications prior to your next appointment, please contact your pharmacy*  Follow-Up: Call back or seek an in-person evaluation if the symptoms worsen or if the condition fails to improve as anticipated.  [facility] [phone]  Other Instructions Shingles  Shingles, or herpes zoster, is an infection. It gives you a skin rash and blisters. These infected areas may hurt a lot. Shingles only happens if: You've had chickenpox. You've been given a shot called a vaccine to protect you from getting chickenpox. Shingles is rare in this case. What are the causes?  Shingles is caused by a germ called the varicella-zoster virus. This is the same germ that causes chickenpox. After you're exposed to the germ, it stays in your body but is dormant. This means it isn't active. Shingles happens if the germ becomes active again. This can happen years after you're first exposed to the germ. What increases the risk? You may be more likely to get shingles if: You're older than 59 years of age. You're under a lot of stress. You have a weak immune system. The immune system is your body's defense system. It may be weak if: You have human immunodeficiency virus (HIV). You  have acquired immunodeficiency syndrome (AIDS). You have cancer. You take medicines that weaken your immune system. These include organ transplant medicines. What are the signs or symptoms? The first symptoms of shingles may be itching, tingling, or pain. Your skin may feel like it's burning. A few days or weeks later, you'll get a rash. Here's what you can expect: The rash is likely to be on one side of your body. The rash may be shaped like a belt or a band. Over time, it will turn into blisters filled with fluid. The blisters will break open and change into scabs. The scabs will dry up in about 2-3 weeks. You may also have: A fever. Chills. A headache. Nausea. How is this diagnosed? Shingles is diagnosed with a skin exam. A sample called a culture may be taken from one of your blisters and sent to a lab. This will show if you have shingles. How is this treated? The rash may last for several weeks. There's no cure for shingles, but your health care provider may give you medicines. These medicines may: Help with pain. Help with itching. Help with irritation and swelling. Help you get better sooner. Help to prevent long-term problems. If the rash is on your face, you may need to see an eye doctor or an ear, nose, and throat (ENT) doctor. Follow these instructions at home: Medicines Take your medicines only as told by your provider. Put an anti-itch cream or numbing cream on the rash or blisters as told by your provider. Relieving itching and discomfort  To help with itching: Put cold, wet cloths called cold compresses on the rash or blisters. Take a cool bath. Try adding baking soda or dry oatmeal to the water. Do not bathe in hot water. Use calamine lotion on the rash or blisters. You can get this type of lotion at the store. Blister and rash care Keep your rash covered with a loose bandage. Wear loose clothes that don't rub on your rash. Take care of your rash as told by your  provider. Make sure you: Wash your hands with soap and water for at least 20 seconds before and after you change your bandage. If you can't use soap and water, use hand sanitizer. Keep your rash and blisters clean by washing them with mild soap and cool water. Change your bandage. Check your rash every day for signs of infection. Check for: More redness, swelling, or pain. Fluid or blood. Warmth. Pus or a bad smell. Do not scratch your rash. Do not pick at your blisters. To help you not scratch: Keep your fingernails clean and cut short. Try to wear gloves or mittens when you sleep. General instructions Rest. Wash your hands often with soap and water for at least 20 seconds. If you can't use soap and water, use hand sanitizer. Washing your hands lowers your chance  of getting a skin infection. Your infection can cause chickenpox in others. If you have blisters that aren't scabs yet, stay away from: Babies. Pregnant people. Children who have eczema. Older people who have organ transplants. People who have a long-term, or chronic, illness. Anyone who hasn't had chickenpox before. Anyone who hasn't gotten the chickenpox vaccine. How is this prevented? Vaccines are the best way to prevent you from getting chickenpox or shingles. Talk with your provider about getting these shots. Where to find more information Centers for Disease Control and Prevention (CDC): TonerPromos.no Contact a health care provider if: Your pain doesn't get better with medicine. Your pain doesn't get better after the rash heals. You have any signs of infection around the rash. Your rash or blisters get worse. You have a fever or chills. Get help right away if: The rash is on your face or nose. You have pain in your face or by your eye. You lose feeling on one side of your face. You have trouble seeing. You have ear pain or ringing in your ear. This information is not intended to replace advice given to you by your  health care provider. Make sure you discuss any questions you have with your health care provider. Document Revised: 12/22/2022 Document Reviewed: 12/22/2022 Elsevier Patient Education  2024 Elsevier Inc.    If you have been instructed to have an in-person evaluation today at a local Urgent Care facility, please use the link below. It will take you to a list of all of our available [facility], including address, phone number and hours of operation. Please do not delay care.  [facility]  If you or a family member do not have a primary care provider, use the link below to schedule a visit and establish care. When you choose a [facility] primary care physician or advanced practice provider, you gain a long-term partner in health. Find a Primary Care Provider  Learn more about [facility]'s in-office and virtual care options: [facility] - Get Care Now

## 2023-09-04 ENCOUNTER — Ambulatory Visit: Payer: Medicare HMO | Admitting: Surgery

## 2023-09-05 ENCOUNTER — Ambulatory Visit: Payer: Medicare HMO | Admitting: Physical Therapy

## 2023-09-11 ENCOUNTER — Ambulatory Visit: Payer: Medicare HMO | Admitting: Surgery

## 2023-09-11 ENCOUNTER — Telehealth: Payer: Self-pay | Admitting: Surgery

## 2023-09-11 ENCOUNTER — Ambulatory Visit: Payer: Self-pay | Admitting: Surgery

## 2023-09-11 ENCOUNTER — Encounter: Payer: Self-pay | Admitting: Surgery

## 2023-09-11 VITALS — BP 158/94 | HR 82 | Temp 98.0°F | Ht 69.0 in | Wt 181.8 lb

## 2023-09-11 DIAGNOSIS — R6889 Other general symptoms and signs: Secondary | ICD-10-CM | POA: Diagnosis not present

## 2023-09-11 DIAGNOSIS — M7989 Other specified soft tissue disorders: Secondary | ICD-10-CM | POA: Diagnosis not present

## 2023-09-11 DIAGNOSIS — L989 Disorder of the skin and subcutaneous tissue, unspecified: Secondary | ICD-10-CM | POA: Diagnosis not present

## 2023-09-11 NOTE — H&P (View-Only) (Signed)
Patient ID: Frank Terry, male   DOB: 04-08-1966, 57 y.o.   MRN: [id number]  Chief Complaint: Nodules of mid back and forehead.  History of Present Illness Frank Terry is a 57 y.o. male with history of prostate cancer, presenting with a mass of the mid back area for a year, and left forehead for 6 months.  Left forehead was bigger at a time, seemingly more diffuse with some associated radiating pain, just above the left eyebrow.  Punch biopsy was nondiagnostic for the underlying pathology. Left back nodule appears to be slightly bigger and more tender/painful.  No history of drainage from either site.  Past Medical History Past Medical History:  Diagnosis Date   Alcohol use disorder    pt currently lives at the [facility]   Allergy    Anxiety    Arthritis    Asthma    well controlled   Compression fracture of spine (HCC)    Congenital absence of one kidney    pt has right kidney   Depression    Gastric ulcer    GERD (gastroesophageal reflux disease)    no meds   Gout    HTN (hypertension)    Hyponatremia    Osteoporosis    Prostate cancer (HCC)    Ulcer    Vasitis 05/2023      Past Surgical History:  Procedure Laterality Date   COLONOSCOPY WITH ESOPHAGOGASTRODUODENOSCOPY (EGD)     PELVIC LYMPH NODE DISSECTION Bilateral 06/18/2023   Procedure: PELVIC LYMPH NODE DISSECTION;  Surgeon: Vanna Scotland, MD;  Location: [facility] ORS;  Service: Urology;  Laterality: Bilateral;   PROSTATE BIOPSY     ROBOT ASSISTED LAPAROSCOPIC RADICAL PROSTATECTOMY N/A 06/18/2023   Procedure: XI ROBOTIC ASSISTED LAPAROSCOPIC RADICAL PROSTATECTOMY;  Surgeon: Vanna Scotland, MD;  Location: [facility] OR[facility];  Service: Urology;  Laterality: N/A;   TONSILLECTOMY     age 35    Allergies  Allergen Reactions   Penicillins Rash    Current Outpatient Medications  Medication Sig Dispense Refill   acetaminophen (TYLENOL) 500 MG tablet Take 1,000 mg by mouth every 6 (six) hours as needed.     colchicine 0.6 MG  tablet Take 0.6 mg by mouth as needed.     irbesartan (AVAPRO) 150 MG tablet Take 1 tablet (150 mg total) by mouth daily. 90 tablet 1   NONFORMULARY OR COMPOUNDED ITEM Trimix (30/1/10)-(Pap/Phent/PGE)  Test Dose  1ml vial   Qty #3 Refills 0  [facility] [phone] Fax [phone] 3 each 0   sildenafil (VIAGRA) 100 MG tablet Take 1 tablet (100 mg total) by mouth daily as needed for erectile dysfunction. 30 tablet 11   No current facility-administered medications for this visit.    Family History Family History  Problem Relation Age of Onset   Hypertension Mother    Lung cancer Mother    Osteoporosis Mother    Alcohol abuse Mother    Arthritis Mother    Cancer Mother    Heart disease Mother    Hypertension Father    Cancer Father        Abdominal   Heart attack Father    Hypertension Sister    Multiple sclerosis Sister       Social History Social History   Tobacco Use   Smoking status: Former    Types: Cigarettes    Start date: 1984   Smokeless tobacco: Former    Types: Engineer, drilling   Vaping status: Never Used  Substance Use Topics   Alcohol use: Not Currently    Alcohol/week: 168.0 standard drinks of alcohol    Types: 168 Cans of beer per week    Comment: pt lives in the [facility] for [facility]   Drug use: Not Currently        Review of Systems  Constitutional:  Positive for malaise/fatigue.  HENT:  Positive for tinnitus.   Eyes:  Positive for blurred vision and pain.  Respiratory: Negative.    Cardiovascular:  Positive for palpitations.  Gastrointestinal:  Positive for diarrhea, heartburn and nausea.  Genitourinary:  Positive for frequency and urgency.  Skin: Negative.   Neurological:  Positive for dizziness and headaches.  Psychiatric/Behavioral:  Positive for depression.      Physical Exam Blood pressure (!) 158/94, pulse 82, temperature 98 F (36.7 C), temperature source Oral, height 5\' 9"  (1.753 m), weight 181 lb  12.8 oz (82.5 kg), SpO2 98%. Last Weight  Most recent update: 09/11/2023 10:43 AM    Weight  82.5 kg (181 lb 12.8 oz)             CONSTITUTIONAL: Well developed, and nourished, appropriately responsive and aware without distress.   EYES: Sclera non-icteric.  Over the left eyebrow there is a subcutaneous flat nodule slightly larger than a centimeter.  There is a healing punch biopsy site.  There is no fluctuance no induration does appear to be deeper than skin, and may be involving the underlying fascial plane. EARS, NOSE, MOUTH AND THROAT:  The oropharynx is clear. Oral mucosa is pink and moist.    Hearing is intact to voice.  NECK: Trachea is midline, and there is no jugular venous distension.  LYMPH NODES:  Lymph nodes in the neck are not appreciated. RESPIRATORY:  Lungs are clear, and breath sounds are equal bilaterally.  Normal respiratory effort without pathologic use of accessory muscles. CARDIOVASCULAR: Heart is regular in rate and rhythm.   Well perfused.  GI: The abdomen is soft, nontender, and nondistended.  MUSCULOSKELETAL:  Symmetrical muscle tone appreciated in all four extremities.    SKIN: Skin turgor is normal. No pathologic skin lesions appreciated.  Deep of the left mid thoracolumbar back, not far from midline is a discrete readily palpable oblong lesion measuring slightly greater than 3 cm in length, perhaps a centimeter to a centimeter half in width.  Firm slightly tender, potentially tethered to the deep fascia. NEUROLOGIC:  Motor and sensation appear grossly normal.  Cranial nerves are grossly without defect. PSYCH:  Alert and oriented to person, place and time. Affect is appropriate for situation.  Data Reviewed I have personally reviewed what is currently available of the patient's imaging, recent labs and medical records.   Labs:     Latest Ref Rng & Units 07/14/2023    8:45 AM 06/20/2023    3:45 PM 06/19/2023    6:28 AM  CBC  WBC 4.0 - 10.5 K/uL 6.4  6.9  8.4    Hemoglobin 13.0 - 17.0 g/dL 16.1  09.6  04.5   Hematocrit 39.0 - 52.0 % 46.7  39.2  37.8   Platelets 150 - 400 K/uL 242  193  180       Latest Ref Rng & Units 07/14/2023    8:45 AM 06/20/2023    3:45 PM 06/19/2023    6:28 AM  CMP  Glucose 70 - 99 mg/dL 94  94  409   BUN 6 - 20 mg/dL 13  13  10  Creatinine 0.61 - 1.24 mg/dL 1.30  8.65  7.84   Sodium 135 - 145 mmol/L 139  135  136   Potassium 3.5 - 5.1 mmol/L 4.2  4.1  3.8   Chloride 98 - 111 mmol/L 108  104  105   CO2 22 - 32 mmol/L 23  23  25    Calcium 8.9 - 10.3 mg/dL 9.5  9.0  8.5   Total Protein 6.5 - 8.1 g/dL 7.5  7.3    Total Bilirubin 0.3 - 1.2 mg/dL 0.8  1.2    Alkaline Phos 38 - 126 U/L 50  41    AST 15 - 41 U/L 19  24    ALT 0 - 44 U/L 19  15        Imaging: Radiological images reviewed:   Within last 24 hrs: No results found.  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  I believe this will be the most comfortable, and to minimize risk of hematoma and overall have a more comfortable experience throughout. We discussed the left forehead lesion, and felt it  prudent to defer this to plastic surgeons considering proximity to underlying nerves and adjacent to the left eyebrow. Risks of anesthesia, bleeding, infection, need for additional procedures.  Unanticipated diagnoses etc. discussed.  I believe questions were adequately answered, without guarantees are expressed or implied.  Excision of soft tissue lesion deep thoracolumbar area greater than 3 cm. Face-to-face time spent with the patient and accompanying care providers(if present) was 30 minutes, with more than 50% of the time spent counseling, educating, and coordinating care of the patient.    These notes generated with voice recognition software. I apologize for typographical errors.  [name] M.D., FACS 09/11/2023, 11:52 AM

## 2023-09-11 NOTE — Patient Instructions (Signed)
A referral to Plastic Surgery has been placed. They will call you for an appointment.   Excision of Skin Lesions Excision of a skin lesion is the removal of a section of skin by making small incisions in the skin. Through this process, the lesion is completely removed. This procedure is often done to treat or prevent cancer or infection. It may also be done to improve cosmetic appearance. You may have this procedure to remove: Cancerous (malignant) growths, such as basal cell carcinoma, squamous cell carcinoma, or melanoma. Noncancerous (benign) growths, such as a cyst or lipoma. Growths, such as moles or skin tags, which may be removed for cosmetic reasons. Various excision or surgical techniques may be used depending on your condition, the location of the lesion, and your overall health. Tell your health care provider about: Any allergies you have. All medicines you are taking, including vitamins, herbs, eye drops, creams, and over-the-counter medicines. Any problems you or family members have had with anesthetic medicines. Any bleeding problems you have. Any surgeries you have had. Any medical conditions you have. Whether you are pregnant or may be pregnant. What are the risks? Generally, this is a safe procedure. However, problems may occur, including: Bleeding. Infection. Scarring. Recurrence of the cyst, lipoma, or cancer. Allergic reaction to anesthetics, surgical materials, or ointments. Damage to nerves, blood vessels, muscles, or other structures. What happens before the procedure? Medicines Ask your health care provider about: Changing or stopping your regular medicines. This is especially important if you are taking diabetes medicines or blood thinners. Taking medicines such as aspirin and ibuprofen. These medicines can thin your blood. Do not take these medicines unless your health care provider tells you to take them. Taking over-the-counter medicines, vitamins, herbs, and  supplements. General instructions Do not use any products that contain nicotine or tobacco. These products include cigarettes, chewing tobacco, and vaping devices, such as e-cigarettes. If you need help quitting, ask your health care provider. Follow instructions from your health care provider about eating or drinking restrictions. Ask your health care provider: How your surgery site will be marked. What steps will be taken to help prevent infection. These steps may include: Removing hair at the surgery site. Washing skin with a germ-killing soap. Taking antibiotic medicine. Ask your health care provider if you will need someone to take you home from the hospital or clinic after the procedure. What happens during the procedure?  You will be given a medicine to numb the area (local anesthetic). Your health care provider will remove the lesions using one of the following excision techniques. Complete surgical excision. This procedure may be done to treat a cancerous growth or a noncancerous cyst or lesion. A small scalpel or scissors will be used to gently cut around and under the lesion until it is completely removed. If bleeding occurs, it will be stopped with a device that delivers heat (electrocautery). The edges of the wound may be stitched (sutured) together. A bandage (dressing) will be applied. Samples will be sent to a lab for testing. Excision of a cyst. An incision will be made on the cyst. The entire cyst will be removed through the incision. The incision may be closed with sutures. Shave excision. This may be done to remove a mole or other small growths. A small blade or scalpel will be used to shave off the lesion. The wound is usually left to heal on its own without sutures. The sample may be sent to a lab for testing. Punch excision.  This may be done to completely remove a mole or other small growths. A small tool that is like a cookie cutter or a hole punch is used to cut  a circle shape out of the skin. The outer edges of the skin will be sutured together. The sample may be sent to a lab for testing. Mohs micrographic surgery. This is usually done to treat skin cancer. This type of excision is mostly used on the face and ears. This procedure is minimally invasive, and it ensures the best cosmetic outcome. A scalpel or a loop instrument will be used to remove layers of the lesion until all the abnormal or cancerous tissue has been removed. The wound may be sutured, depending on its size. The tissue will be checked under a microscope right away. The procedure may vary among health care providers and hospitals. At the end of any of these procedures, antibiotic ointment will be applied as needed. What happens after the procedure? Talk with your health care provider to discuss any test results, treatment options, and if necessary, the need for more tests. Keep all follow-up visits. This is important. Summary Excision of a skin lesion is the removal of a section of skin by making small incisions in the skin. This procedure is often done to treat or prevent skin cancer, remove benign growths, or it may be done to improve cosmetic appearance. Various excision or surgical techniques may be used depending on your condition, the location of the lesion, and your overall health. After the procedure, talk with your health care provider to discuss any test results, treatment options, and if necessary, the need for more tests. Keep all follow-up visits. This is important. This information is not intended to replace advice given to you by your health care provider. Make sure you discuss any questions you have with your health care provider. Document Revised: 06/07/2021 Document Reviewed: 06/07/2021 Elsevier Patient Education  2024 [employer].

## 2023-09-11 NOTE — Progress Notes (Signed)
Patient ID: Frank Terry, male   DOB: 04-08-1966, 57 y.o.   MRN: [id number]  Chief Complaint: Nodules of mid back and forehead.  History of Present Illness Frank Terry is a 57 y.o. male with history of prostate cancer, presenting with a mass of the mid back area for a year, and left forehead for 6 months.  Left forehead was bigger at a time, seemingly more diffuse with some associated radiating pain, just above the left eyebrow.  Punch biopsy was nondiagnostic for the underlying pathology. Left back nodule appears to be slightly bigger and more tender/painful.  No history of drainage from either site.  Past Medical History Past Medical History:  Diagnosis Date   Alcohol use disorder    pt currently lives at the [facility]   Allergy    Anxiety    Arthritis    Asthma    well controlled   Compression fracture of spine (HCC)    Congenital absence of one kidney    pt has right kidney   Depression    Gastric ulcer    GERD (gastroesophageal reflux disease)    no meds   Gout    HTN (hypertension)    Hyponatremia    Osteoporosis    Prostate cancer (HCC)    Ulcer    Vasitis 05/2023      Past Surgical History:  Procedure Laterality Date   COLONOSCOPY WITH ESOPHAGOGASTRODUODENOSCOPY (EGD)     PELVIC LYMPH NODE DISSECTION Bilateral 06/18/2023   Procedure: PELVIC LYMPH NODE DISSECTION;  Surgeon: Vanna Scotland, MD;  Location: [facility] ORS;  Service: Urology;  Laterality: Bilateral;   PROSTATE BIOPSY     ROBOT ASSISTED LAPAROSCOPIC RADICAL PROSTATECTOMY N/A 06/18/2023   Procedure: XI ROBOTIC ASSISTED LAPAROSCOPIC RADICAL PROSTATECTOMY;  Surgeon: Vanna Scotland, MD;  Location: [facility] OR[facility];  Service: Urology;  Laterality: N/A;   TONSILLECTOMY     age 35    Allergies  Allergen Reactions   Penicillins Rash    Current Outpatient Medications  Medication Sig Dispense Refill   acetaminophen (TYLENOL) 500 MG tablet Take 1,000 mg by mouth every 6 (six) hours as needed.     colchicine 0.6 MG  tablet Take 0.6 mg by mouth as needed.     irbesartan (AVAPRO) 150 MG tablet Take 1 tablet (150 mg total) by mouth daily. 90 tablet 1   NONFORMULARY OR COMPOUNDED ITEM Trimix (30/1/10)-(Pap/Phent/PGE)  Test Dose  1ml vial   Qty #3 Refills 0  [facility] [phone] Fax [phone] 3 each 0   sildenafil (VIAGRA) 100 MG tablet Take 1 tablet (100 mg total) by mouth daily as needed for erectile dysfunction. 30 tablet 11   No current facility-administered medications for this visit.    Family History Family History  Problem Relation Age of Onset   Hypertension Mother    Lung cancer Mother    Osteoporosis Mother    Alcohol abuse Mother    Arthritis Mother    Cancer Mother    Heart disease Mother    Hypertension Father    Cancer Father        Abdominal   Heart attack Father    Hypertension Sister    Multiple sclerosis Sister       Social History Social History   Tobacco Use   Smoking status: Former    Types: Cigarettes    Start date: 1984   Smokeless tobacco: Former    Types: Engineer, drilling   Vaping status: Never Used  Substance Use Topics   Alcohol use: Not Currently    Alcohol/week: 168.0 standard drinks of alcohol    Types: 168 Cans of beer per week    Comment: pt lives in the [facility] for [facility] addiction   Drug use: Not Currently        Review of Systems  Constitutional:  Positive for malaise/fatigue.  HENT:  Positive for tinnitus.   Eyes:  Positive for blurred vision and pain.  Respiratory: Negative.    Cardiovascular:  Positive for palpitations.  Gastrointestinal:  Positive for diarrhea, heartburn and nausea.  Genitourinary:  Positive for frequency and urgency.  Skin: Negative.   Neurological:  Positive for dizziness and headaches.  Psychiatric/Behavioral:  Positive for depression.      Physical Exam Blood pressure (!) 158/94, pulse 82, temperature 98 F (36.7 C), temperature source Oral, height 5\' 9"  (1.753 m), weight 181 lb  12.8 oz (82.5 kg), SpO2 98%. Last Weight  Most recent update: 09/11/2023 10:43 AM    Weight  82.5 kg (181 lb 12.8 oz)             CONSTITUTIONAL: Well developed, and nourished, appropriately responsive and aware without distress.   EYES: Sclera non-icteric.  Over the left eyebrow there is a subcutaneous flat nodule slightly larger than a centimeter.  There is a healing punch biopsy site.  There is no fluctuance no induration does appear to be deeper than skin, and may be involving the underlying fascial plane. EARS, NOSE, MOUTH AND THROAT:  The oropharynx is clear. Oral mucosa is pink and moist.    Hearing is intact to voice.  NECK: Trachea is midline, and there is no jugular venous distension.  LYMPH NODES:  Lymph nodes in the neck are not appreciated. RESPIRATORY:  Lungs are clear, and breath sounds are equal bilaterally.  Normal respiratory effort without pathologic use of accessory muscles. CARDIOVASCULAR: Heart is regular in rate and rhythm.   Well perfused.  GI: The abdomen is soft, nontender, and nondistended.  MUSCULOSKELETAL:  Symmetrical muscle tone appreciated in all four extremities.    SKIN: Skin turgor is normal. No pathologic skin lesions appreciated.  Deep of the left mid thoracolumbar back, not far from midline is a discrete readily palpable oblong lesion measuring slightly greater than 3 cm in length, perhaps a centimeter to a centimeter half in width.  Firm slightly tender, potentially tethered to the deep fascia. NEUROLOGIC:  Motor and sensation appear grossly normal.  Cranial nerves are grossly without defect. PSYCH:  Alert and oriented to person, place and time. Affect is appropriate for situation.  Data Reviewed I have personally reviewed what is currently available of the patient's imaging, recent labs and medical records.   Labs:     Latest Ref Rng & Units 07/14/2023    8:45 AM 06/20/2023    3:45 PM 06/19/2023    6:28 AM  CBC  WBC 4.0 - 10.5 K/uL 6.4  6.9  8.4    Hemoglobin 13.0 - 17.0 g/dL 16.1  09.6  04.5   Hematocrit 39.0 - 52.0 % 46.7  39.2  37.8   Platelets 150 - 400 K/uL 242  193  180       Latest Ref Rng & Units 07/14/2023    8:45 AM 06/20/2023    3:45 PM 06/19/2023    6:28 AM  CMP  Glucose 70 - 99 mg/dL 94  94  409   BUN 6 - 20 mg/dL 13  13  10  Creatinine 0.61 - 1.24 mg/dL 1.30  8.65  7.84   Sodium 135 - 145 mmol/L 139  135  136   Potassium 3.5 - 5.1 mmol/L 4.2  4.1  3.8   Chloride 98 - 111 mmol/L 108  104  105   CO2 22 - 32 mmol/L 23  23  25    Calcium 8.9 - 10.3 mg/dL 9.5  9.0  8.5   Total Protein 6.5 - 8.1 g/dL 7.5  7.3    Total Bilirubin 0.3 - 1.2 mg/dL 0.8  1.2    Alkaline Phos 38 - 126 U/L 50  41    AST 15 - 41 U/L 19  24    ALT 0 - 44 U/L 19  15        Imaging: Radiological images reviewed:   Within last 24 hrs: No results found.  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  I believe this will be the most comfortable, and to minimize risk of hematoma and overall have a more comfortable experience throughout. We discussed the left forehead lesion, and felt it  prudent to defer this to plastic surgeons considering proximity to underlying nerves and adjacent to the left eyebrow. Risks of anesthesia, bleeding, infection, need for additional procedures.  Unanticipated diagnoses etc. discussed.  I believe questions were adequately answered, without guarantees are expressed or implied.  Excision of soft tissue lesion deep thoracolumbar area greater than 3 cm. Face-to-face time spent with the patient and accompanying care providers(if present) was 30 minutes, with more than 50% of the time spent counseling, educating, and coordinating care of the patient.    These notes generated with voice recognition software. I apologize for typographical errors.  [name] M.D., FACS 09/11/2023, 11:52 AM

## 2023-09-11 NOTE — Telephone Encounter (Signed)
Patient has been advised of Pre-Admission date/time, and Surgery date at [facility].  Surgery Date: 09/19/23 Preadmission Testing Date: 09/14/23 (phone 8a-1p)  Patient has been made aware to call [phone], between 1-3:00pm the day before surgery, to find out what time to arrive for surgery.

## 2023-09-12 ENCOUNTER — Encounter: Payer: Medicare HMO | Admitting: Physical Therapy

## 2023-09-13 ENCOUNTER — Telehealth: Payer: Self-pay

## 2023-09-13 NOTE — Telephone Encounter (Signed)
Reminder letter mailed to the patient.

## 2023-09-14 ENCOUNTER — Other Ambulatory Visit: Payer: Self-pay

## 2023-09-14 ENCOUNTER — Encounter
Admission: RE | Admit: 2023-09-14 | Discharge: 2023-09-14 | Disposition: A | Discharge status: Home / Self Care | Payer: Medicare HMO | Source: Ambulatory Visit | Attending: Surgery | Admitting: Surgery

## 2023-09-14 NOTE — Patient Instructions (Addendum)
Your procedure is scheduled on: Wednesday 09/19/23  Report to the Registration Desk on the 1st floor of the [facility]. To find out your arrival time, please call [phone] between 1PM - 3PM on: Tuesday 09/18/23 If your arrival time is 6:00 am, do not arrive before that time as the [facility] entrance doors do not open until 6:00 am.  REMEMBER: Instructions that are not followed completely may result in serious medical risk, up to and including death; or upon the discretion of your surgeon and anesthesiologist your surgery may need to be rescheduled.  Do not eat food after midnight the night before surgery.  No gum chewing or hard candies.  You may however, drink CLEAR liquids up to 2 hours before you are scheduled to arrive for your surgery. Do not drink anything within 2 hours of your scheduled arrival time.  Clear liquids include: - water  - apple juice without pulp - black coffee or tea (Do NOT add milk or creamers to the coffee or tea) Do NOT drink anything that is not on this list.  One week prior to surgery: Stop Anti-inflammatories (NSAIDS) such as Advil, Aleve, Ibuprofen, Motrin, Naproxen, Naprosyn and Aspirin based products such as Excedrin, Goody's Powder, BC Powder. Stop ANY OVER THE COUNTER supplements until after surgery.  You may however, continue to take Tylenol if needed for pain up until the day of surgery.    Continue taking all of your other prescription medications up until the before your surgery except: Stop sildenafil (VIAGRA) 100 MG  2 days prior to surgery (take last dose Sunday 09/16/23)  ON THE DAY OF SURGERY ONLY TAKE THESE MEDICATIONS WITH SIPS OF WATER:  None    No Alcohol for 24 hours before or after surgery.  No Smoking including e-cigarettes for 24 hours before surgery.  No chewable tobacco products for at least 6 hours before surgery.  No nicotine patches on the day of surgery.  Do not use any "recreational" drugs for at least a  week (preferably 2 weeks) before your surgery.  Please be advised that the combination of cocaine and anesthesia may have negative outcomes, up to and including death. If you test positive for cocaine, your surgery will be cancelled.  On the morning of surgery brush your teeth with toothpaste and water, you may rinse your mouth with mouthwash if you wish. Do not swallow any toothpaste or mouthwash.  Use CHG Soap or wipes as directed on instruction sheet.  Do not wear jewelry, make-up, hairpins, clips or nail polish.  For welded (permanent) jewelry: bracelets, anklets, waist bands, etc.  Please have this removed prior to surgery.  If it is not removed, there is a chance that hospital personnel will need to cut it off on the day of surgery.  Do not wear lotions, powders, or perfumes.   Do not shave body hair from the neck down 48 hours before surgery.  Contact lenses, hearing aids and dentures may not be worn into surgery.  Do not bring valuables to the hospital. [facility] is not responsible for any missing/lost belongings or valuables.   Notify your doctor if there is any change in your medical condition (cold, fever, infection).  Wear comfortable clothing (specific to your surgery type) to the hospital.  After surgery, you can help prevent lung complications by doing breathing exercises.  Take deep breaths and cough every 1-2 hours. Your doctor may order a device called an Incentive Spirometer to help you take deep breaths.  When coughing or sneezing, hold a pillow firmly against your incision with both hands. This is called "splinting." Doing this helps protect your incision. It also decreases belly discomfort.  If you are being admitted to the hospital overnight, leave your suitcase in the car. After surgery it may be brought to your room.  In case of increased patient census, it may be necessary for you, the patient, to continue your postoperative care in the Same Day Surgery  department.  If you are being discharged the day of surgery, you will not be allowed to drive home. You will need a responsible individual to drive you home and stay with you for 24 hours after surgery.   If you are taking public transportation, you will need to have a responsible individual with you.  Please call the Pre-admissions Testing Dept. at [phone] if you have any questions about these instructions.  Surgery Visitation Policy:  Patients having surgery or a procedure may have two visitors.  Children under the age of 12 must have an adult with them who is not the patient.  Inpatient Visitation:    Visiting hours are 7 a.m. to 8 p.m. Up to four visitors are allowed at one time in a patient room. The visitors may rotate out with other people during the day.  One visitor age 60 or older may stay with the patient overnight and must be in the room by 8 p.m.    Preparing for Surgery with CHLORHEXIDINE GLUCONATE (CHG) Soap  Chlorhexidine Gluconate (CHG) Soap  o An antiseptic cleaner that kills germs and bonds with the skin to continue killing germs even after washing  o Used for showering the night before surgery and morning of surgery  Before surgery, you can play an important role by reducing the number of germs on your skin.  CHG (Chlorhexidine gluconate) soap is an antiseptic cleanser which kills germs and bonds with the skin to continue killing germs even after washing.  Please do not use if you have an allergy to CHG or antibacterial soaps. If your skin becomes reddened/irritated stop using the CHG.  1. Shower the NIGHT BEFORE SURGERY and the MORNING OF SURGERY with CHG soap.  2. If you choose to wash your hair, wash your hair first as usual with your normal shampoo.  3. After shampooing, rinse your hair and body thoroughly to remove the shampoo.  4. Use CHG as you would any other liquid soap. You can apply CHG directly to the skin and wash gently with a scrungie  or a clean washcloth.  5. Apply the CHG soap to your body only from the neck down. Do not use on open wounds or open sores. Avoid contact with your eyes, ears, mouth, and genitals (private parts). Wash face and genitals (private parts) with your normal soap.  6. Wash thoroughly, paying special attention to the area where your surgery will be performed.  7. Thoroughly rinse your body with warm water.  8. Do not shower/wash with your normal soap after using and rinsing off the CHG soap.  9. Pat yourself dry with a clean towel.  10. Wear clean pajamas to bed the night before surgery.  12. Place clean sheets on your bed the night of your first shower and do not sleep with pets.  13. Shower again with the CHG soap on the day of surgery prior to arriving at the hospital.  14. Do not apply any deodorants/lotions/powders.  15. Please wear clean clothes to the  hospital.

## 2023-09-19 ENCOUNTER — Other Ambulatory Visit: Payer: Self-pay

## 2023-09-19 ENCOUNTER — Ambulatory Visit
Admission: RE | Admit: 2023-09-19 | Discharge: 2023-09-19 | Disposition: A | Discharge status: Home / Self Care | Payer: Medicare HMO | Attending: Surgery | Admitting: Surgery

## 2023-09-19 ENCOUNTER — Ambulatory Visit: Payer: Medicare HMO | Admitting: Physical Therapy

## 2023-09-19 ENCOUNTER — Ambulatory Visit: Payer: Medicare HMO | Admitting: Anesthesiology

## 2023-09-19 ENCOUNTER — Encounter: Payer: Self-pay | Admitting: Surgery

## 2023-09-19 ENCOUNTER — Encounter
Admission: RE | Disposition: A | Discharge status: Home / Self Care | Payer: Self-pay | Source: Home / Self Care | Attending: Surgery

## 2023-09-19 DIAGNOSIS — N529 Male erectile dysfunction, unspecified: Secondary | ICD-10-CM

## 2023-09-19 DIAGNOSIS — R222 Localized swelling, mass and lump, trunk: Secondary | ICD-10-CM | POA: Insufficient documentation

## 2023-09-19 DIAGNOSIS — K219 Gastro-esophageal reflux disease without esophagitis: Secondary | ICD-10-CM | POA: Insufficient documentation

## 2023-09-19 DIAGNOSIS — M7989 Other specified soft tissue disorders: Secondary | ICD-10-CM

## 2023-09-19 DIAGNOSIS — J45909 Unspecified asthma, uncomplicated: Secondary | ICD-10-CM | POA: Diagnosis not present

## 2023-09-19 DIAGNOSIS — Z8546 Personal history of malignant neoplasm of prostate: Secondary | ICD-10-CM | POA: Insufficient documentation

## 2023-09-19 DIAGNOSIS — Z9079 Acquired absence of other genital organ(s): Secondary | ICD-10-CM | POA: Diagnosis not present

## 2023-09-19 DIAGNOSIS — Z8249 Family history of ischemic heart disease and other diseases of the circulatory system: Secondary | ICD-10-CM | POA: Diagnosis not present

## 2023-09-19 DIAGNOSIS — D171 Benign lipomatous neoplasm of skin and subcutaneous tissue of trunk: Secondary | ICD-10-CM | POA: Diagnosis not present

## 2023-09-19 DIAGNOSIS — Z801 Family history of malignant neoplasm of trachea, bronchus and lung: Secondary | ICD-10-CM | POA: Diagnosis not present

## 2023-09-19 DIAGNOSIS — C61 Malignant neoplasm of prostate: Secondary | ICD-10-CM

## 2023-09-19 DIAGNOSIS — I10 Essential (primary) hypertension: Secondary | ICD-10-CM | POA: Insufficient documentation

## 2023-09-19 DIAGNOSIS — Z87891 Personal history of nicotine dependence: Secondary | ICD-10-CM | POA: Diagnosis not present

## 2023-09-19 HISTORY — PX: MASS EXCISION: SHX2000

## 2023-09-19 SURGERY — EXCISION MASS
Anesthesia: General | Site: Back | Laterality: Left

## 2023-09-19 MED ORDER — FENTANYL CITRATE (PF) 100 MCG/2ML IJ SOLN
INTRAMUSCULAR | Status: DC | PRN
  Administered 2023-09-19 (×2): 50 ug via INTRAVENOUS

## 2023-09-19 MED ORDER — CHLORHEXIDINE GLUCONATE CLOTH 2 % EX PADS: 6.0000 | MEDICATED_PAD | Freq: Once | CUTANEOUS | Status: DC

## 2023-09-19 MED ORDER — PROPOFOL 1000 MG/100ML IV EMUL
INTRAVENOUS | Status: AC
  Filled 2023-09-19: qty 100

## 2023-09-19 MED ORDER — FENTANYL CITRATE (PF) 100 MCG/2ML IJ SOLN: 25.0000 ug | INTRAMUSCULAR | Status: DC | PRN

## 2023-09-19 MED ORDER — BUPIVACAINE-EPINEPHRINE (PF) 0.25% -1:200000 IJ SOLN
INTRAMUSCULAR | Status: DC | PRN
  Administered 2023-09-19: 30 mL

## 2023-09-19 MED ORDER — SUGAMMADEX SODIUM 200 MG/2ML IV SOLN
INTRAVENOUS | Status: DC | PRN
  Administered 2023-09-19: 200 mg via INTRAVENOUS

## 2023-09-19 MED ORDER — ACETAMINOPHEN 500 MG PO TABS
ORAL_TABLET | ORAL | Status: AC
  Filled 2023-09-19: qty 2

## 2023-09-19 MED ORDER — ACETAMINOPHEN 500 MG PO TABS
1000.0000 mg | ORAL_TABLET | ORAL | Status: AC
  Administered 2023-09-19: 1000 mg via ORAL

## 2023-09-19 MED ORDER — LIDOCAINE HCL (CARDIAC) PF 100 MG/5ML IV SOSY
PREFILLED_SYRINGE | INTRAVENOUS | Status: DC | PRN
  Administered 2023-09-19: 100 mg via INTRAVENOUS

## 2023-09-19 MED ORDER — MIDAZOLAM HCL 2 MG/2ML IJ SOLN
INTRAMUSCULAR | Status: DC | PRN
  Administered 2023-09-19: 2 mg via INTRAVENOUS

## 2023-09-19 MED ORDER — CELECOXIB 200 MG PO CAPS
200.0000 mg | ORAL_CAPSULE | ORAL | Status: AC
  Administered 2023-09-19: 200 mg via ORAL

## 2023-09-19 MED ORDER — GABAPENTIN 300 MG PO CAPS
ORAL_CAPSULE | ORAL | Status: AC
  Filled 2023-09-19: qty 1

## 2023-09-19 MED ORDER — LACTATED RINGERS IV SOLN: INTRAVENOUS | Status: DC

## 2023-09-19 MED ORDER — DEXAMETHASONE SODIUM PHOSPHATE 10 MG/ML IJ SOLN
INTRAMUSCULAR | Status: DC | PRN
  Administered 2023-09-19: 10 mg via INTRAVENOUS

## 2023-09-19 MED ORDER — CHLORHEXIDINE GLUCONATE 0.12 % MT SOLN
OROMUCOSAL | Status: AC
  Filled 2023-09-19: qty 15

## 2023-09-19 MED ORDER — ROCURONIUM BROMIDE 100 MG/10ML IV SOLN
INTRAVENOUS | Status: DC | PRN
  Administered 2023-09-19: 10 mg via INTRAVENOUS

## 2023-09-19 MED ORDER — CEFAZOLIN SODIUM-DEXTROSE 2-4 GM/100ML-% IV SOLN
INTRAVENOUS | Status: AC
  Filled 2023-09-19: qty 100

## 2023-09-19 MED ORDER — ONDANSETRON HCL 4 MG/2ML IJ SOLN
INTRAMUSCULAR | Status: DC | PRN
  Administered 2023-09-19 (×2): 4 mg via INTRAVENOUS

## 2023-09-19 MED ORDER — CHLORHEXIDINE GLUCONATE 0.12 % MT SOLN
15.0000 mL | Freq: Once | OROMUCOSAL | Status: AC
Start: 2023-09-19 — End: 2023-09-19
  Administered 2023-09-19: 15 mL via OROMUCOSAL

## 2023-09-19 MED ORDER — DEXMEDETOMIDINE HCL IN NACL 200 MCG/50ML IV SOLN
INTRAVENOUS | Status: DC | PRN
  Administered 2023-09-19: 8 ug via INTRAVENOUS

## 2023-09-19 MED ORDER — 0.9 % SODIUM CHLORIDE (POUR BTL) OPTIME
TOPICAL | Status: DC | PRN
  Administered 2023-09-19: 500 mL

## 2023-09-19 MED ORDER — FENTANYL CITRATE (PF) 100 MCG/2ML IJ SOLN
INTRAMUSCULAR | Status: AC
  Filled 2023-09-19: qty 2

## 2023-09-19 MED ORDER — IBUPROFEN 800 MG PO TABS
800.0000 mg | ORAL_TABLET | Freq: Three times a day (TID) | ORAL | 0 refills | Status: DC | PRN
  Filled 2023-09-19: qty 30, 10d supply, fill #0

## 2023-09-19 MED ORDER — ORAL CARE MOUTH RINSE: 15.0000 mL | Freq: Once | OROMUCOSAL | Status: AC

## 2023-09-19 MED ORDER — MIDAZOLAM HCL 2 MG/2ML IJ SOLN
INTRAMUSCULAR | Status: AC
  Filled 2023-09-19: qty 2

## 2023-09-19 MED ORDER — OXYCODONE HCL 5 MG/5ML PO SOLN: 5.0000 mg | Freq: Once | ORAL | Status: DC | PRN

## 2023-09-19 MED ORDER — OXYCODONE HCL 5 MG PO TABS
5.0000 mg | ORAL_TABLET | Freq: Four times a day (QID) | ORAL | 0 refills | Status: DC | PRN
  Filled 2023-09-19: qty 15, 4d supply, fill #0

## 2023-09-19 MED ORDER — CELECOXIB 200 MG PO CAPS
ORAL_CAPSULE | ORAL | Status: AC
  Filled 2023-09-19: qty 1

## 2023-09-19 MED ORDER — BUPIVACAINE LIPOSOME 1.3 % IJ SUSP: 20.0000 mL | Freq: Once | INTRAMUSCULAR | Status: DC

## 2023-09-19 MED ORDER — PROPOFOL 10 MG/ML IV BOLUS
INTRAVENOUS | Status: DC | PRN
  Administered 2023-09-19: 200 mg via INTRAVENOUS

## 2023-09-19 MED ORDER — BUPIVACAINE-EPINEPHRINE (PF) 0.25% -1:200000 IJ SOLN
INTRAMUSCULAR | Status: AC
  Filled 2023-09-19: qty 30

## 2023-09-19 MED ORDER — OXYCODONE HCL 5 MG PO TABS: 5.0000 mg | ORAL_TABLET | Freq: Once | ORAL | Status: DC | PRN

## 2023-09-19 MED ORDER — GLYCOPYRROLATE 0.2 MG/ML IJ SOLN
INTRAMUSCULAR | Status: DC | PRN
  Administered 2023-09-19: .2 mg via INTRAVENOUS

## 2023-09-19 MED ORDER — SUCCINYLCHOLINE CHLORIDE 200 MG/10ML IV SOSY
PREFILLED_SYRINGE | INTRAVENOUS | Status: DC | PRN
  Administered 2023-09-19: 100 mg via INTRAVENOUS

## 2023-09-19 MED ORDER — CEFAZOLIN SODIUM-DEXTROSE 2-4 GM/100ML-% IV SOLN
2.0000 g | INTRAVENOUS | Status: AC
  Administered 2023-09-19: 2 g via INTRAVENOUS

## 2023-09-19 MED ORDER — GABAPENTIN 300 MG PO CAPS
300.0000 mg | ORAL_CAPSULE | ORAL | Status: AC
  Administered 2023-09-19: 300 mg via ORAL

## 2023-09-19 SURGICAL SUPPLY — 32 items
ADH SKN CLS APL DERMABOND .7 (GAUZE/BANDAGES/DRESSINGS) ×1
APL PRP STRL LF DISP 70% ISPRP (MISCELLANEOUS) ×1
BLADE SURG 15 STRL LF DISP TIS (BLADE) ×1 IMPLANT
BLADE SURG 15 STRL SS (BLADE) ×1
CHLORAPREP W/TINT 26 (MISCELLANEOUS) ×1 IMPLANT
DERMABOND ADVANCED .7 DNX12 (GAUZE/BANDAGES/DRESSINGS) ×1 IMPLANT
DRAPE LAPAROTOMY 77X122 PED (DRAPES) ×1 IMPLANT
ELECT CAUTERY BLADE TIP 2.5 (TIP) ×1
ELECT REM PT RETURN 9FT ADLT (ELECTROSURGICAL) ×1
ELECTRODE CAUTERY BLDE TIP 2.5 (TIP) ×1 IMPLANT
ELECTRODE REM PT RTRN 9FT ADLT (ELECTROSURGICAL) ×1 IMPLANT
GAUZE 4X4 16PLY ~~LOC~~+RFID DBL (SPONGE) ×1 IMPLANT
GLOVE ORTHO TXT STRL SZ7.5 (GLOVE) ×1 IMPLANT
GOWN STRL REUS W/ TWL LRG LVL3 (GOWN DISPOSABLE) ×1 IMPLANT
GOWN STRL REUS W/ TWL XL LVL3 (GOWN DISPOSABLE) ×1 IMPLANT
GOWN STRL REUS W/TWL LRG LVL3 (GOWN DISPOSABLE) ×1
GOWN STRL REUS W/TWL XL LVL3 (GOWN DISPOSABLE) ×1
KIT TURNOVER KIT A (KITS) ×1 IMPLANT
MANIFOLD NEPTUNE II (INSTRUMENTS) ×1 IMPLANT
NDL HYPO 22X1.5 SAFETY MO (MISCELLANEOUS) ×1 IMPLANT
NEEDLE HYPO 22X1.5 SAFETY MO (MISCELLANEOUS) ×1
PACK BASIN MINOR ARMC (MISCELLANEOUS) ×1 IMPLANT
SUT MNCRL 4-0 (SUTURE) ×1
SUT MNCRL 4-0 27XMFL (SUTURE) ×1
SUT SILK 2 0 SH (SUTURE) IMPLANT
SUT VIC AB 3-0 SH 27 (SUTURE) ×1
SUT VIC AB 3-0 SH 27X BRD (SUTURE) ×1 IMPLANT
SUTURE MNCRL 4-0 27XMF (SUTURE) ×1 IMPLANT
SYR 10ML LL (SYRINGE) ×1 IMPLANT
SYR BULB IRRIG 60ML STRL (SYRINGE) ×1 IMPLANT
TRAP FLUID SMOKE EVACUATOR (MISCELLANEOUS) ×1 IMPLANT
WATER STERILE IRR 500ML POUR (IV SOLUTION) ×1 IMPLANT

## 2023-09-19 NOTE — Interval H&P Note (Signed)
History and Physical Interval Note:  09/19/2023 7:19 AM  Frank Terry  has presented today for surgery, with the diagnosis of mass of back thoracolumbar M79.89.  The various methods of treatment have been discussed with the patient and family. After consideration of risks, benefits and other options for treatment, the patient has consented to  Procedure(s): EXCISION MASS, deep soft tissue mass back (N/A) as a surgical intervention.  The patient's history has been reviewed, patient examined, no change in status, stable for surgery.  I have reviewed the patient's chart and labs.  Questions were answered to the patient's satisfaction.   The left infra-scapular area is marked.    [name]

## 2023-09-19 NOTE — Transfer of Care (Signed)
Immediate Anesthesia Transfer of Care Note  Patient: Frank Terry  Procedure(s) Performed: EXCISION MASS, deep soft tissue mass back (Left: Back)  Patient Location: PACU  Anesthesia Type:General  Level of Consciousness: awake, drowsy, and patient cooperative  Airway & Oxygen Therapy: Patient Spontanous Breathing and Patient connected to face mask oxygen  Post-op Assessment: Report given to RN and Post -op Vital signs reviewed and stable  Post vital signs: Reviewed and stable  Last Vitals:  Vitals Value Taken Time  BP 133/86 09/19/23 0910  Temp 36.2 C 09/19/23 0910  Pulse 80 09/19/23 0910  Resp 14 09/19/23 0910  SpO2 98 % 09/19/23 0910    Last Pain:  Vitals:   09/19/23 0910  TempSrc: Temporal  PainSc: 0-No pain         Complications: No notable events documented.

## 2023-09-19 NOTE — Anesthesia Preprocedure Evaluation (Signed)
Anesthesia Evaluation  Patient identified by MRN, date of birth, ID band Patient awake    Reviewed: Allergy & Precautions, NPO status , Patient's Chart, lab work & pertinent test results  History of Anesthesia Complications Negative for: history of anesthetic complications  Airway Mallampati: III  TM Distance: >3 FB Neck ROM: full    Dental  (+) Chipped   Pulmonary neg shortness of breath, asthma , former smoker   Pulmonary exam normal        Cardiovascular Exercise Tolerance: Good hypertension, (-) angina (-) Past MI and (-) DOE Normal cardiovascular exam     Neuro/Psych  PSYCHIATRIC DISORDERS      negative neurological ROS     GI/Hepatic Neg liver ROS, PUD,GERD  Controlled,,  Endo/Other  negative endocrine ROS    Renal/GU Renal disease     Musculoskeletal   Abdominal   Peds  Hematology negative hematology ROS (+)   Anesthesia Other Findings Past Medical History: No date: Alcohol use disorder     Comment:  pt currently lives at the [facility] No date: Allergy No date: Anxiety No date: Arthritis No date: Asthma     Comment:  well controlled No date: Compression fracture of spine (HCC) No date: Congenital absence of one kidney     Comment:  pt has right kidney No date: Depression No date: Gastric ulcer No date: GERD (gastroesophageal reflux disease)     Comment:  no meds No date: Gout No date: HTN (hypertension) No date: Hyponatremia No date: Osteoporosis No date: Prostate cancer (HCC) No date: Ulcer 05/2023: Vasitis  Past Surgical History: No date: COLONOSCOPY WITH ESOPHAGOGASTRODUODENOSCOPY (EGD) 06/18/2023: PELVIC LYMPH NODE DISSECTION; Bilateral     Comment:  Procedure: PELVIC LYMPH NODE DISSECTION;  Surgeon:               Vanna Scotland, MD;  Location: [facility] ORS;  Service:               Urology;  Laterality: Bilateral; No date: PROSTATE BIOPSY 06/18/2023: ROBOT ASSISTED LAPAROSCOPIC RADICAL  PROSTATECTOMY; N/A     Comment:  Procedure: XI ROBOTIC ASSISTED LAPAROSCOPIC RADICAL               PROSTATECTOMY;  Surgeon: Vanna Scotland, MD;  Location:               [facility] ORS;  Service: Urology;  Laterality: N/A; No date: TONSILLECTOMY     Comment:  age 15  BMI    Body Mass Index: 26.73 kg/m      Reproductive/Obstetrics negative OB ROS                             Anesthesia Physical Anesthesia Plan  ASA: 3  Anesthesia Plan: General ETT   Post-op Pain Management:    Induction: Intravenous  PONV Risk Score and Plan: Ondansetron, Dexamethasone, Midazolam and Treatment may vary due to age or medical condition  Airway Management Planned: Oral ETT  Additional Equipment:   Intra-op Plan:   Post-operative Plan: Extubation in OR  Informed Consent: I have reviewed the patients History and Physical, chart, labs and discussed the procedure including the risks, benefits and alternatives for the proposed anesthesia with the patient or authorized representative who has indicated his/her understanding and acceptance.     Dental Advisory Given  Plan Discussed with: Anesthesiologist, CRNA and Surgeon  Anesthesia Plan Comments: (Patient consented for risks of anesthesia including but not limited to:  -  adverse reactions to medications - damage to eyes, teeth, lips or other oral mucosa - nerve damage due to positioning  - sore throat or hoarseness - Damage to heart, brain, nerves, lungs, other parts of body or loss of life  Patient voiced understanding and assent.)       Anesthesia Quick Evaluation

## 2023-09-19 NOTE — Discharge Instructions (Signed)
AMBULATORY SURGERY  DISCHARGE INSTRUCTIONS   The drugs that you were given will stay in your system until tomorrow so for the next 24 hours you should not:  Drive an automobile Make any legal decisions Drink any alcoholic beverage   You may resume regular meals tomorrow.  Today it is better to start with liquids and gradually work up to solid foods.  You may eat anything you prefer, but it is better to start with liquids, then soup and crackers, and gradually work up to solid foods.   Please notify your doctor immediately if you have any unusual bleeding, trouble breathing, redness and pain at the surgery site, drainage, fever, or pain not relieved by medication.    Additional Instructions: 

## 2023-09-19 NOTE — Anesthesia Procedure Notes (Signed)
Procedure Name: Intubation Date/Time: 09/19/2023 7:34 AM  Performed by: Mohammed Kindle, CRNAPre-anesthesia Checklist: Patient identified, Emergency Drugs available, Suction available and Patient being monitored Patient Re-evaluated:Patient Re-evaluated prior to induction Oxygen Delivery Method: Circle system utilized Preoxygenation: Pre-oxygenation with 100% oxygen Induction Type: IV induction Ventilation: Mask ventilation without difficulty Laryngoscope Size: McGraph and 3 Tube type: Oral Tube size: 7.0 mm Number of attempts: 1 Airway Equipment and Method: Stylet Placement Confirmation: ETT inserted through vocal cords under direct vision, positive ETCO2, breath sounds checked- equal and bilateral and CO2 detector Secured at: 21 cm Tube secured with: Tape Dental Injury: Teeth and Oropharynx as per pre-operative assessment

## 2023-09-19 NOTE — Anesthesia Postprocedure Evaluation (Signed)
Anesthesia Post Note  Patient: [name]  Procedure(s) Performed: EXCISION MASS, deep soft tissue mass back (Left: Back)  Patient location during evaluation: PACU Anesthesia Type: General Level of consciousness: awake and alert Pain management: pain level controlled Vital Signs Assessment: post-procedure vital signs reviewed and stable Respiratory status: spontaneous breathing, nonlabored ventilation, respiratory function stable and patient connected to nasal cannula oxygen Cardiovascular status: blood pressure returned to baseline and stable Postop Assessment: no apparent nausea or vomiting Anesthetic complications: no   No notable events documented.   Last Vitals:  Vitals:   09/19/23 0910 09/19/23 1015  BP: 133/86 (!) 135/97  Pulse: 80 78  Resp: 14 18  Temp: (!) 36.2 C (!) 36.1 C  SpO2: 98% 98%    Last Pain:  Vitals:   09/19/23 1015  TempSrc: Temporal  PainSc:                  Cleda Mccreedy Carols Clemence

## 2023-09-19 NOTE — Op Note (Signed)
Excision of subfascial soft tissue mass left infrascapular back area,  less than 5 cm.  [id number]  Pre-operative Diagnosis: Deep soft tissue mass left infrascapular back area.  Post-operative Diagnosis: same.    Surgeon: [name], M.D., FACS  Anesthesia: General  Findings: Well-defined subfascial mass of uncertain etiology.  Estimated Blood Loss: 10 mL         Specimens: Subfascial mass left infrascapular area, additional anterior soft tissues as superficial margin.          Complications: none              Procedure Details  The patient was seen again in the Holding Room. The benefits, complications, treatment options, and expected outcomes were discussed with the patient. The risks of bleeding, infection, recurrence of symptoms, failure to resolve symptoms, unanticipated injury, prosthetic placement, prosthetic infection, any of which could require further surgery were reviewed with the patient. The likelihood of improving the patient's symptoms with return to their baseline status is expected.  The patient and/or family concurred with the proposed plan, giving informed consent.  The patient was taken to Operating Room, identified and the procedure verified.    Prior to the induction of general anesthesia, antibiotic prophylaxis was administered. VTE prophylaxis was in place.  General anesthesia was then administered and tolerated well. After the induction, the patient was positioned in the prone position and the left upper back was prepped with  Chloraprep and draped in the sterile fashion.  A Time Out was held and the above information confirmed.  Local infiltration of quarter percent Marcaine with epinephrine, transverse incision is made directly over the mass.  Incision was then carried the subcutaneous tissues to approach the mass.  I used electrosurgery to maintain excellent hemostasis.  Once the mass was not initially identified, I felt it prudent to proceed with wider local  excision, uncertain of this mass as primary etiology.  I maintained an excellent margin around the perimeter excising it sharply from the deep soft tissues and overlying fascia.  Once evaluating this I tagged it with a silk suture with a short single suture marking the superficial aspect and a double tailed suture for the deep aspect.  I felt I was close to the mass on that superficial aspect so I proceeded with excision of the additional margin from this area.  The mass seemed readily encapsulated. Once this was completed, we confirmed adequate hemostasis, irrigated his wound cavity.  Additional local infiltration is utilized to provide postoperative pain relief.  The wound was then closed with deep sutures of 3-0 Vicryl, followed by dermal sutures of 3-0 Vicryl, followed by a running 4-0 Monocryl subcuticular.  The incision was then sealed with Dermabond. Patient appeared to tolerate procedure well, was turned to the supine position, extubated and transferred to the recovery room in stable condition.      [name] M.D., [facility] 09/19/2023 8:26 AM

## 2023-09-20 ENCOUNTER — Encounter: Payer: Self-pay | Admitting: Surgery

## 2023-09-21 ENCOUNTER — Other Ambulatory Visit: Payer: Medicare HMO

## 2023-09-21 DIAGNOSIS — R6889 Other general symptoms and signs: Secondary | ICD-10-CM | POA: Diagnosis not present

## 2023-09-21 DIAGNOSIS — C61 Malignant neoplasm of prostate: Secondary | ICD-10-CM

## 2023-09-22 LAB — PSA: Prostate Specific Ag, Serum: <0.1 ng/mL (ref 0.0–4.0)

## 2023-09-25 ENCOUNTER — Ambulatory Visit (INDEPENDENT_AMBULATORY_CARE_PROVIDER_SITE_OTHER): Payer: Medicare HMO | Admitting: Family Medicine

## 2023-09-25 ENCOUNTER — Encounter: Payer: Self-pay | Admitting: Family Medicine

## 2023-09-25 ENCOUNTER — Ambulatory Visit: Payer: Medicare HMO | Admitting: Urology

## 2023-09-25 VITALS — BP 134/88 | HR 83 | Ht 69.0 in | Wt 186.4 lb

## 2023-09-25 VITALS — BP 136/88 | HR 92 | Ht 69.0 in | Wt 186.6 lb

## 2023-09-25 DIAGNOSIS — G4452 New daily persistent headache (NDPH): Secondary | ICD-10-CM | POA: Insufficient documentation

## 2023-09-25 DIAGNOSIS — R5383 Other fatigue: Secondary | ICD-10-CM

## 2023-09-25 DIAGNOSIS — C61 Malignant neoplasm of prostate: Secondary | ICD-10-CM

## 2023-09-25 DIAGNOSIS — C779 Secondary and unspecified malignant neoplasm of lymph node, unspecified: Secondary | ICD-10-CM

## 2023-09-25 DIAGNOSIS — Z1159 Encounter for screening for other viral diseases: Secondary | ICD-10-CM | POA: Diagnosis not present

## 2023-09-25 DIAGNOSIS — R6889 Other general symptoms and signs: Secondary | ICD-10-CM | POA: Diagnosis not present

## 2023-09-25 DIAGNOSIS — N5231 Erectile dysfunction following radical prostatectomy: Secondary | ICD-10-CM | POA: Diagnosis not present

## 2023-09-25 DIAGNOSIS — N393 Stress incontinence (female) (male): Secondary | ICD-10-CM | POA: Diagnosis not present

## 2023-09-25 MED ORDER — SUMATRIPTAN SUCCINATE 50 MG PO TABS: 50.0000 mg | ORAL_TABLET | ORAL | 0 refills | Status: DC | PRN

## 2023-09-25 NOTE — Progress Notes (Signed)
I,Amy L Pierron,acting as a scribe for Vanna Scotland, MD.,have documented all relevant documentation on the behalf of Vanna Scotland, MD,as directed by  Vanna Scotland, MD while in the presence of Vanna Scotland, MD.  09/25/2023 8:55 PM   Frank Terry 03-21-1966 [id number]  Referring provider: Elberta Fortis, MD [address] [place],  [place] [address]  Chief Complaint  Patient presents with   Prostate Cancer    HPI: 57 year-old male with a personal history of prostate cancer presents today for a two month follow-up.  He underwent radical robotic prostatectomy with bilateral pelvic lymph node dissection on 06/18/2023. It non nerve sparing on the right and partial on the left. We did do a more extensive lymph node dissection on the right side based on his PET scan, suggestive of a possible 5 mm right external iliac lymph node. Surgical pathology was consistent with Gleason 3+4 disease with focal positive margin at the right posterior lateral mid. Extraprostatic extension was noted. He also had an extended pelvic lymph node dissection that did in fact show a positive node. There is a discrepancy between a surgical pathology and interoperative lymph nodes. They indicate that 1 of 9 nodes on the left was positive, however the more extended lymph node dissection was done on the right. Suspect this was transcribed incorrectly. All of the nodes on the right were negative. [id number].   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   His most recent PSA on 09/21/2023 is undetectable.   He recently had shingles which also affected his testicles and still has some pain there.   He went to his primary care regarding feeling fatigued, staying in bed most of the day, and having a lump on his head and back. He is not sure what kind of labs, if any, were done. He has been trying to use caffeine to increase his energy.   His urinary leakage is improving. He has  switched from the disposable pads to the washable ones.  He has noticed some improvement with his erectile dysfunction. He is using the daily Viagra but doesn't need the injections anymore right now.   PMH: Past Medical History:  Diagnosis Date   Alcohol use disorder    pt currently lives at the [facility]   Allergy    Anxiety    Arthritis    Asthma    well controlled   Compression fracture of spine (HCC)    Congenital absence of one kidney    pt has right kidney   Depression    Gastric ulcer    GERD (gastroesophageal reflux disease)    no meds   Gout    HTN (hypertension)    Hyponatremia    Osteoporosis    Prostate cancer (HCC)    Ulcer    Vasitis 05/2023    Surgical History: Past Surgical History:  Procedure Laterality Date   COLONOSCOPY WITH ESOPHAGOGASTRODUODENOSCOPY (EGD)     MASS EXCISION Left 09/19/2023   Procedure: EXCISION MASS, deep soft tissue mass back;  Surgeon: [name], MD;  Location: [facility] ORS;  Service: General;  Laterality: Left;   PELVIC LYMPH NODE DISSECTION Bilateral 06/18/2023   Procedure: PELVIC LYMPH NODE DISSECTION;  Surgeon: Vanna Scotland, MD;  Location: [facility] ORS;  Service: Urology;  Laterality: Bilateral;   PROSTATE BIOPSY     ROBOT ASSISTED LAPAROSCOPIC RADICAL PROSTATECTOMY N/A 06/18/2023   Procedure: XI ROBOTIC ASSISTED LAPAROSCOPIC RADICAL PROSTATECTOMY;  Surgeon: Vanna Scotland,  MD;  Location: [facility] ORS;  Service: Urology;  Laterality: N/A;   TONSILLECTOMY     age 29    Home Medications:  Allergies as of 09/25/2023       Reactions   Penicillins Rash        Medication List        Accurate as of September 25, 2023  8:55 PM. If you have any questions, ask your nurse or doctor.          acetaminophen 500 MG tablet Commonly known as: TYLENOL Take 1,000 mg by mouth every 6 (six) hours as needed.   cetirizine 10 MG tablet Commonly known as: ZYRTEC Take 10 mg by mouth daily. Unsure of dose   colchicine 0.6 MG  tablet Take 0.6 mg by mouth as needed.   cyanocobalamin 1000 MCG tablet Commonly known as: VITAMIN B12 Take 1,000 mcg by mouth daily.   ibuprofen 800 MG tablet Commonly known as: ADVIL Take 1 tablet (800 mg total) by mouth every 8 (eight) hours as needed.   irbesartan 150 MG tablet Commonly known as: AVAPRO Take 1 tablet (150 mg total) by mouth daily.   NONFORMULARY OR COMPOUNDED ITEM Trimix (30/1/10)-(Pap/Phent/PGE)  Test Dose  1ml vial   Qty #3 Refills 0  [facility] [phone] Fax [phone]   oxyCODONE 5 MG immediate release tablet Commonly known as: Oxy IR/ROXICODONE Take 1 tablet (5 mg total) by mouth every 6 (six) hours as needed for severe pain (pain score 7-10).   sildenafil 100 MG tablet Commonly known as: VIAGRA Take 1 tablet (100 mg total) by mouth daily as needed for erectile dysfunction. What changed: when to take this   SUMAtriptan 50 MG tablet Commonly known as: Imitrex Take 1 tablet (50 mg total) by mouth every 2 (two) hours as needed for migraine. May repeat in 2 hours if headache persists or recurs. Do NOT take more than 2 tablets a day. Started by: Janeal Holmes        Allergies:  Allergies  Allergen Reactions   Penicillins Rash    Family History: Family History  Problem Relation Age of Onset   Hypertension Mother    Lung cancer Mother    Osteoporosis Mother    Alcohol abuse Mother    Arthritis Mother    Cancer Mother    Heart disease Mother    Hypertension Father    Cancer Father        Abdominal   Heart attack Father    Hypertension Sister    Multiple sclerosis Sister     Social History:  reports that he has quit smoking. His smoking use included cigarettes. He started smoking about 40 years ago. He has quit using smokeless tobacco.  His smokeless tobacco use included chew. He reports that he does not currently use alcohol after a past usage of about 168.0 standard drinks of alcohol per week. He reports that he does  not currently use drugs.   Physical Exam: BP 134/88   Pulse 83   Ht 5\' 9"  (1.753 m)   Wt 186 lb 6.4 oz (84.6 kg)   BMI 27.53 kg/m   Constitutional:  Alert and oriented, No acute distress. HEENT: [place] AT, moist mucus membranes.  Trachea midline, no masses. Neurologic: Grossly intact, no focal deficits, moving all 4 extremities. Psychiatric: Normal mood and affect.   Assessment & Plan:    1. Prostate cancer high risk, metastatic to single lymph node  - Seen on PET scan.  -  PSA undetectable  - Plan on presenting to tumor board with the oncologist and radiation oncologist to evaluate if further treatment is suggested due to being metastatic.   - Repeat PSA in 3 months, then again in another 3 months along with an office visit.   2. Stress incontinence  - Improving. He is down to washable underwear.   3. Erectile dysfunction  - Continue using daily Sildenafil. He decided not to do intracavernosal injections.  - He has had some erections at this point.   Return in about 6 months (around 03/24/2024) for PSA.   [facility] [address] [place], [place] [address] [phone]

## 2023-09-25 NOTE — Assessment & Plan Note (Signed)
Pt reports at least a month of significant fatigue. Low concern for depression as pt reports continued desire to do things, he just lacks energy to do so. Denies trouble falling or staying asleep. Will arrange for workup for anemia, low B12, kidney function/electrolytes, thyroid level, Vitamin D level. - Labs done today: B12 level, TSH, CBC for anemia, Vit D level, BMP - Consider sleep study at return visit - Consider EKG at follow-up visit, especially if palpitations/racing heart beat reoccurs

## 2023-09-25 NOTE — Patient Instructions (Addendum)
I will get a CT of your head. We will collect multiple labs today, and I will follow up those results with you. If the headaches or dizziness worsens, please let us know or go to the emergency department. I have sent in sumatriptan for the pain. DO NOT take more than 2 tablets per day.

## 2023-09-25 NOTE — Assessment & Plan Note (Addendum)
Pt reports daily headache which began around a month ago (in 07/2023) and has been getting worse. Associated blurry vision, tinnitus. Given symptoms and hx of prostate cancer, will get a CT head to evaluate for lesions/masses. Will prescribe medication for pain control. - CT Head ordered - START Sumatriptan 50 mg every 2 hours as needed, max 2 tablets a day

## 2023-09-25 NOTE — Progress Notes (Signed)
SUBJECTIVE:   CHIEF COMPLAINT / HPI:   Frank Terry is a 57 y.o. male who presents today for fatigue and headache.  Headache Headache has been present for at least a month and worsening. Headache is usually around his left eye, but more recently has been the whole head. Not worse with loud noises or bright light. Headache happens most days a week and lasts all day. Pain is a 5-6/10 in severity. Has been using ibuprofen without much relief. Has had blurry vision associated with headache, sometimes tearing of eyes. Associated with tinnitus. No rhinorrhea. No vomiting, has had nausea.  Fatigue Has had fatigue for a long time, at least a month, getting worse. Pt reports he wakes up feeling tired. He has only been able to get out of bed a few hours each day. Has not been eating much due to lack of energy. Reports he is able to sleep fine, but he stays tired. He has been sleeping 12-13 hours. No hx of chemotherapy/radiation therapy. No depressive symptoms.  He does report occasional episodes of feeling like his heart racing; not currently experiencing any episodes.  Recent surgery 09/19/2023 to remove mass of L back; benign pathology. Pt still has mass on L forehead (previously with benign punch biopsy), recommended to consult with Plastics for removal of forehead mass by surgeon who removed back mass.  Appointment with Dr. Apolinar Junes in Urology early today on 09/25/23 for follow-up of prostate cancer, cleared from their end and declared cancer-free.  PERTINENT  PMH / PSH: Prostate cancer s/p prostatectomy (05/2023), HTN, asthma, diverticulosis  OBJECTIVE:   BP 136/88   Pulse 92   Ht 5\' 9"  (1.753 m)   Wt 186 lb 9.6 oz (84.6 kg)   SpO2 100%   BMI 27.56 kg/m   General: Pt is seated in chair. Appears tired. No acute distress. Cardiovascular: RRR, no murmurs, rubs, gallops. Pulmonary: Normal work of breathing. Lungs clear to auscultation bilaterally. Neuro: Alert and oriented to person,  place, event, time. Pupils PERRL. Cranial nerves II-XII intact. Strength and sensation intact in bilateral arms, legs. Psych: Normal affect.  ASSESSMENT/PLAN:   New daily persistent headache Pt reports daily headache which began around a month ago (in 07/2023) and has been getting worse. Associated blurry vision, tinnitus. Given symptoms and hx of prostate cancer, will get a CT head to evaluate for lesions. Will prescribe medication for pain control.  Return precautions discussed for worsening headaches or dizziness. - CT Head ordered - START Sumatriptan 50 mg every 2 hours as needed, max 2 tablets a day  Fatigue Pt reports at least a month of significant fatigue. Low concern for depression as pt reports continued desire to do things, he just lacks energy to do so. Denies trouble falling or staying asleep. Will arrange for workup for anemia, low B12, kidney function/electrolytes, thyroid level, Vitamin D level. - Labs done today: B12 level, TSH, CBC for anemia, Vit D level, BMP - Consider sleep study at return visit - Consider EKG at follow-up visit, especially if palpitations/racing heart beat reoccurs  Healthcare Maintenance Getting Hep C one time screening done.  Governor Rooks, Medical Student [facility]  I was personally present and performed or re-performed the history, physical exam and medical decision making activities of this service and have verified that the service and findings are accurately documented in the student's note.  Janeal Holmes, MD  09/25/2023, 5:46 PM

## 2023-09-26 ENCOUNTER — Encounter: Payer: Self-pay | Admitting: Family Medicine

## 2023-09-26 ENCOUNTER — Other Ambulatory Visit: Payer: Self-pay

## 2023-09-26 DIAGNOSIS — G4452 New daily persistent headache (NDPH): Secondary | ICD-10-CM

## 2023-09-26 LAB — CBC WITH DIFFERENTIAL/PLATELET
Basophils Absolute: 0.1 10*3/uL (ref 0.0–0.2)
Basos: 1 %
EOS (ABSOLUTE): 0.3 10*3/uL (ref 0.0–0.4)
Eos: 3 %
Hematocrit: 42.8 % (ref 37.5–51.0)
Hemoglobin: 14.9 g/dL (ref 13.0–17.7)
Immature Grans (Abs): 0 10*3/uL (ref 0.0–0.1)
Immature Granulocytes: 0 %
Lymphocytes Absolute: 1.4 10*3/uL (ref 0.7–3.1)
Lymphs: 16 %
MCH: 33.3 pg — ABNORMAL HIGH (ref 26.6–33.0)
MCHC: 34.8 g/dL (ref 31.5–35.7)
MCV: 96 fL (ref 79–97)
Monocytes Absolute: 0.9 10*3/uL (ref 0.1–0.9)
Monocytes: 11 %
Neutrophils Absolute: 6 10*3/uL (ref 1.4–7.0)
Neutrophils: 69 %
Platelets: 252 10*3/uL (ref 150–450)
RBC: 4.48 x10E6/uL (ref 4.14–5.80)
RDW: 11.9 % (ref 11.6–15.4)
WBC: 8.7 10*3/uL (ref 3.4–10.8)

## 2023-09-26 LAB — BASIC METABOLIC PANEL
BUN/Creatinine Ratio: 17 (ref 9–20)
BUN: 18 mg/dL (ref 6–24)
CO2: 21 mmol/L (ref 20–29)
Calcium: 9.3 mg/dL (ref 8.7–10.2)
Chloride: 101 mmol/L (ref 96–106)
Creatinine, Ser: 1.08 mg/dL (ref 0.76–1.27)
Glucose: 82 mg/dL (ref 70–99)
Potassium: 4.1 mmol/L (ref 3.5–5.2)
Sodium: 135 mmol/L (ref 134–144)
eGFR: 79 mL/min/{1.73_m2} (ref >59)

## 2023-09-26 LAB — TSH RFX ON ABNORMAL TO FREE T4: TSH: 0.367 u[IU]/mL — ABNORMAL LOW (ref 0.450–4.500)

## 2023-09-26 LAB — HCV INTERPRETATION

## 2023-09-26 LAB — T4F: T4,Free (Direct): 1.23 ng/dL (ref 0.82–1.77)

## 2023-09-26 LAB — VITAMIN D 25 HYDROXY (VIT D DEFICIENCY, FRACTURES): Vit D, 25-Hydroxy: 30.6 ng/mL (ref 30.0–100.0)

## 2023-09-26 LAB — VITAMIN B12: Vitamin B-12: 505 pg/mL (ref 232–1245)

## 2023-09-26 LAB — HCV AB W REFLEX TO QUANT PCR: HCV Ab: NONREACTIVE

## 2023-09-26 MED ORDER — SUMATRIPTAN SUCCINATE 50 MG PO TABS
50.0000 mg | ORAL_TABLET | ORAL | 0 refills | Status: DC | PRN
  Filled 2023-09-26: qty 9, 30d supply, fill #0

## 2023-10-01 ENCOUNTER — Other Ambulatory Visit (HOSPITAL_COMMUNITY): Payer: Self-pay

## 2023-10-01 ENCOUNTER — Telehealth: Payer: Self-pay

## 2023-10-01 NOTE — Progress Notes (Unsigned)
[facility] POST-OP OFFICE VISIT  10/02/2023  HPI: Frank Terry is a 57 y.o. 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  Bland in anticipating behavior to be consistent with benign process.   Vital signs: BP (!) 137/90 (BP Location: Left Arm, Patient Position: Sitting, Cuff Size: Large)   Pulse 80   Temp 98.1 F (36.7 C) (Oral)   Ht 5\' 9"  (1.753 m)   Wt 186 lb 3.2 oz (84.5 kg)   SpO2 96%   BMI 27.50 kg/m    Physical Exam: Constitutional: He appears well, no complaints.  Skin: Left infrascapular back skin incision appears to be clean, dry and intact.  No ecchymosis, no induration no erythema.  Assessment/Plan: This is a 57 y.o. male 13 days s/p excision of left infrascapular soft tissue mass, consistent with benign spindle cell lipoma.  Patient Active Problem List   Diagnosis Date Noted   New daily persistent headache 09/25/2023   Soft tissue mass 09/11/2023   Nodule of skin of back 08/16/2023   Nodule of skin of head 08/16/2023   Blurry vision 08/16/2023   Allergic rhinitis 08/10/2023   Osteoporosis 08/10/2023   Skin lesion 08/10/2023   Fatigue 04/06/2023   Knee pain, bilateral 03/19/2023   Compression fracture of spine (HCC) 03/19/2023   Anxious mood 03/19/2023   B12 deficiency 03/19/2023   Hypertension 03/03/2023   Gastric ulcer 03/03/2023   Hyponatremia 03/02/2023   Asthma, chronic 03/02/2023   Gout 03/02/2023   Prostate cancer (HCC) 03/02/2023   Spondylosis of lumbar region without myelopathy or radiculopathy 03/05/2020   Diverticulosis of intestine with bleeding 01/28/2016   Congenital absence of left kidney 01/28/2016    -We discussed the benign nature of the lesion were excised.  We glad to see him back as needed should any concerns arise.   [name] M.D., FACS 10/02/2023, 11:13 AM

## 2023-10-01 NOTE — Telephone Encounter (Signed)
Pharmacy Patient Advocate Encounter   Received notification from CoverMyMeds that prior authorization for SUMATRIPTAN is required/requested.   Insurance verification completed.   The patient is insured through [employer] .   Per test claim: PA required; PA submitted to above mentioned insurance via CoverMyMeds Key/confirmation #/EOC [id number]. Status is pending   Will need to be filled #9 for 23 day supply.

## 2023-10-02 ENCOUNTER — Ambulatory Visit: Payer: Medicare HMO | Admitting: Surgery

## 2023-10-02 ENCOUNTER — Ambulatory Visit
Admission: RE | Admit: 2023-10-02 | Discharge: 2023-10-02 | Disposition: A | Discharge status: Home / Self Care | Payer: Medicare HMO | Source: Ambulatory Visit | Attending: Family Medicine | Admitting: Family Medicine

## 2023-10-02 ENCOUNTER — Encounter: Payer: Self-pay | Admitting: Surgery

## 2023-10-02 VITALS — BP 137/90 | HR 80 | Temp 98.1°F | Ht 69.0 in | Wt 186.2 lb

## 2023-10-02 DIAGNOSIS — D179 Benign lipomatous neoplasm, unspecified: Secondary | ICD-10-CM | POA: Insufficient documentation

## 2023-10-02 DIAGNOSIS — Z09 Encounter for follow-up examination after completed treatment for conditions other than malignant neoplasm: Secondary | ICD-10-CM

## 2023-10-02 DIAGNOSIS — D171 Benign lipomatous neoplasm of skin and subcutaneous tissue of trunk: Secondary | ICD-10-CM

## 2023-10-02 DIAGNOSIS — R519 Headache, unspecified: Secondary | ICD-10-CM | POA: Diagnosis not present

## 2023-10-02 DIAGNOSIS — R6889 Other general symptoms and signs: Secondary | ICD-10-CM | POA: Diagnosis not present

## 2023-10-02 DIAGNOSIS — G4452 New daily persistent headache (NDPH): Secondary | ICD-10-CM

## 2023-10-02 LAB — SURGICAL PATHOLOGY

## 2023-10-02 NOTE — Patient Instructions (Signed)

## 2023-10-05 NOTE — Telephone Encounter (Signed)
Pharmacy Patient Advocate Encounter  Received notification from [facility] that Prior Authorization for SUMTRIPTAN has been DENIED.  PHARMACY SHOULD BE ABLE TO FILL 9 TABLETS PER 30 DAYS.

## 2023-10-10 ENCOUNTER — Encounter: Payer: Self-pay | Admitting: Family Medicine

## 2023-10-26 ENCOUNTER — Other Ambulatory Visit: Payer: Self-pay | Admitting: Family Medicine

## 2023-11-20 ENCOUNTER — Institutional Professional Consult (permissible substitution): Payer: Medicare HMO | Admitting: Plastic Surgery

## 2023-11-22 ENCOUNTER — Ambulatory Visit: Payer: Medicare HMO | Admitting: Family Medicine

## 2023-11-22 NOTE — Progress Notes (Deleted)
    SUBJECTIVE:   CHIEF COMPLAINT / HPI:   HA Seen 09/25/2023 for similar concern, obtained CT head and started on sumatriptan  50 mg as needed (was not approved by insurance).  CT head showed some mucosal thickening consistent with sinusitis but otherwise negative for acute pathology.  Fatigue Seen at the same office visit as above, normal vitamin D , B12, BMP, CBC. TSH mildly low  *Colonoscopy?  PERTINENT  PMH / PSH: ***  OBJECTIVE:   There were no vitals taken for this visit. ***  General: NAD, pleasant, able to participate in exam Cardiac: RRR, no murmurs. Respiratory: CTAB, normal effort, No wheezes, rales or rhonchi Abdomen: Bowel sounds present, nontender, nondistended Extremities: no edema or cyanosis. Skin: warm and dry, no rashes noted Neuro: alert, no obvious focal deficits Psych: Normal affect and mood  ASSESSMENT/PLAN:   No problem-specific Assessment & Plan notes found for this encounter.     Dr. Izetta Nap, DO [facility]    {    This will disappear when note is signed, click to select method of visit    :1}

## 2023-11-27 ENCOUNTER — Institutional Professional Consult (permissible substitution): Payer: Medicare HMO | Admitting: Plastic Surgery

## 2023-11-30 ENCOUNTER — Ambulatory Visit: Payer: Medicare HMO | Admitting: Physician Assistant

## 2023-12-26 ENCOUNTER — Other Ambulatory Visit: Payer: Self-pay

## 2024-01-02 ENCOUNTER — Other Ambulatory Visit: Payer: 59

## 2024-01-02 DIAGNOSIS — C61 Malignant neoplasm of prostate: Secondary | ICD-10-CM

## 2024-01-03 ENCOUNTER — Encounter: Payer: Self-pay | Admitting: Urology

## 2024-01-03 LAB — PSA: Prostate Specific Ag, Serum: <0.1 ng/mL (ref 0.0–4.0)

## 2024-01-08 ENCOUNTER — Ambulatory Visit: Payer: 59 | Admitting: Physician Assistant

## 2024-01-08 VITALS — BP 133/86 | HR 76 | Ht 69.0 in | Wt 200.0 lb

## 2024-01-08 DIAGNOSIS — R399 Unspecified symptoms and signs involving the genitourinary system: Secondary | ICD-10-CM

## 2024-01-08 LAB — URINALYSIS, COMPLETE
Bilirubin, UA: NEGATIVE
Glucose, UA: NEGATIVE
Ketones, UA: NEGATIVE
Leukocytes,UA: NEGATIVE
Nitrite, UA: NEGATIVE
Protein,UA: NEGATIVE
RBC, UA: NEGATIVE
Specific Gravity, UA: 1.015 (ref 1.005–1.030)
Urobilinogen, Ur: 0.2 mg/dL (ref 0.2–1.0)
pH, UA: 5.5 (ref 5.0–7.5)

## 2024-01-08 LAB — MICROSCOPIC EXAMINATION
Bacteria, UA: NONE SEEN
RBC, Urine: NONE SEEN /[HPF] (ref 0–2)

## 2024-01-08 LAB — BLADDER SCAN AMB NON-IMAGING: Scan Result: 116

## 2024-01-08 MED ORDER — TAMSULOSIN HCL 0.4 MG PO CAPS: 0.4000 mg | ORAL_CAPSULE | Freq: Every day | ORAL | 11 refills | Status: DC

## 2024-01-08 NOTE — Progress Notes (Signed)
01/08/2024 5:15 PM   Frank Terry 09/16/1966 [id number]  CC: Chief Complaint  Patient presents with   Benign Prostatic Hypertrophy   HPI: Frank Terry is a 58 y.o. male with PMH solitary right kidney, erectile dysfunction, and prostate cancer s/p prostatectomy with Dr. Apolinar Junes in July 2024 who completed pelvic floor PT who presents today for evaluation of bothersome urinary symptoms.   Today he reports chronic frequency, urinary incontinence, and incomplete bladder emptying with double voiding.  He describes both a constant dripping leakage as well as urge incontinence.  He has been doing his pelvic floor PT exercises, but continues to be primarily bothered by the sensation of incomplete emptying.  In-office UA and microscopy pan negative. PVR .  PMH: Past Medical History:  Diagnosis Date   Alcohol use disorder    pt currently lives at the [facility]: [facility]   Allergy    Anxiety    Arthritis    Asthma    well controlled   Compression fracture of spine (HCC)    Congenital absence of one kidney    pt has right kidney   Depression    Gastric ulcer    GERD (gastroesophageal reflux disease)    no meds   Gout    HTN (hypertension)    Hyponatremia    Osteoporosis    Prostate cancer (HCC)    Ulcer    Vasitis 05/2023    Surgical History: Past Surgical History:  Procedure Laterality Date   COLONOSCOPY WITH ESOPHAGOGASTRODUODENOSCOPY (EGD)     MASS EXCISION Left 09/19/2023   Procedure: EXCISION MASS, deep soft tissue mass back;  Surgeon: [name], MD;  Location: [facility] ORS;  Service: General;  Laterality: Left;   PELVIC LYMPH NODE DISSECTION Bilateral 06/18/2023   Procedure: PELVIC LYMPH NODE DISSECTION;  Surgeon: Vanna Scotland, MD;  Location: [facility];  Service: Urology;  Laterality: Bilateral;   PROSTATE BIOPSY     ROBOT ASSISTED LAPAROSCOPIC RADICAL PROSTATECTOMY N/A 06/18/2023   Procedure: XI ROBOTIC ASSISTED LAPAROSCOPIC RADICAL PROSTATECTOMY;  Surgeon:  Vanna Scotland, MD;  Location: [facility] OR[facility];  Service: Urology;  Laterality: N/A;   TONSILLECTOMY     age 57    Home Medications:  Allergies as of 01/08/2024       Reactions   Penicillins Rash        Medication List        Accurate as of January 08, 2024  5:15 PM. If you have any questions, ask your nurse or doctor.          acetaminophen 500 MG tablet Commonly known as: TYLENOL Take 1,000 mg by mouth every 6 (six) hours as needed.   cetirizine 10 MG tablet Commonly known as: ZYRTEC Take 10 mg by mouth daily. Unsure of dose   colchicine 0.6 MG tablet Take 0.6 mg by mouth as needed.   cyanocobalamin 1000 MCG tablet Commonly known as: VITAMIN B12 Take 1,000 mcg by mouth daily.   ibuprofen 800 MG tablet Commonly known as: ADVIL Take 1 tablet (800 mg total) by mouth every 8 (eight) hours as needed.   irbesartan 150 MG tablet Commonly known as: AVAPRO Take 1 tablet (150 mg total) by mouth daily.   NONFORMULARY OR COMPOUNDED ITEM Trimix (30/1/10)-(Pap/Phent/PGE)  Test Dose  1ml vial   Qty #3 Refills 0  [facility] [phone] Fax [phone]   sildenafil 100 MG tablet Commonly known as: VIAGRA Take 1 tablet (100 mg total) by mouth daily as needed for erectile dysfunction.  What changed: when to take this   SUMAtriptan 50 MG tablet Commonly known as: Imitrex Take 1 tablet (50 mg total) by mouth every 2 (two) hours as needed for migraine. May repeat in 2 hours if headache persists or recurs. Do NOT take more than 2 tablets a day.   tamsulosin 0.4 MG Caps capsule Commonly known as: FLOMAX Take 1 capsule (0.4 mg total) by mouth daily. Started by: Carman Ching        Allergies:  Allergies  Allergen Reactions   Penicillins Rash    Family History: Family History  Problem Relation Age of Onset   Hypertension Mother    Lung cancer Mother    Osteoporosis Mother    Alcohol abuse Mother    Arthritis Mother    Cancer Mother     Heart disease Mother    Hypertension Father    Cancer Father        Abdominal   Heart attack Father    Hypertension Sister    Multiple sclerosis Sister     Social History:   reports that he has quit smoking. His smoking use included cigarettes. He started smoking about 41 years ago. He has quit using smokeless tobacco.  His smokeless tobacco use included chew. He reports that he does not currently use alcohol after a past usage of about 168.0 standard drinks of alcohol per week. He reports that he does not currently use drugs.  Physical Exam: BP 133/86   Pulse 76   Ht 5\' 9"  (1.753 m)   Wt 200 lb (90.7 kg)   BMI 29.53 kg/m   Constitutional:  Alert and oriented, no acute distress, nontoxic appearing HEENT: [place], AT Cardiovascular: No clubbing, cyanosis, or edema Respiratory: Normal respiratory effort, no increased work of breathing Skin: No rashes, bruises or suspicious lesions Neurologic: Grossly intact, no focal deficits, moving all 4 extremities Psychiatric: Normal mood and affect  Laboratory Data: Results for orders placed or performed in visit on 01/08/24  Microscopic Examination   Collection Time: 01/08/24  9:24 AM   Urine  Result Value Ref Range   WBC, UA 0-5 0 - 5 /hpf   RBC, Urine None seen 0 - 2 /hpf   Epithelial Cells (non renal) 0-10 0 - 10 /hpf   Bacteria, UA None seen None seen/Few  Urinalysis, Complete   Collection Time: 01/08/24  9:24 AM  Result Value Ref Range   Specific Gravity, UA 1.015 1.005 - 1.030   pH, UA 5.5 5.0 - 7.5   Color, UA Yellow Yellow   Appearance Ur Clear Clear   Leukocytes,UA Negative Negative   Protein,UA Negative Negative/Trace   Glucose, UA Negative Negative   Ketones, UA Negative Negative   RBC, UA Negative Negative   Bilirubin, UA Negative Negative   Urobilinogen, Ur 0.2 0.2 - 1.0 mg/dL   Nitrite, UA Negative Negative   Microscopic Examination See below:   Bladder Scan (Post Void Residual) in office   Collection Time: 01/08/24   9:32 AM  Result Value Ref Range   Scan Result 116    Assessment & Plan:   1. Lower urinary tract symptoms (LUTS) (Primary) UA is bland but PVR is relatively elevated given his prostatectomy within the past year.  We discussed that urinary leakage after prostatectomy is common, especially within the first year.  That said, he seems particularly bothered by the sensation of incomplete emptying and I question possible bladder neck contracture.  Will start him on Flomax to see if this  opens up his bladder neck a little bit and get some emptying better.  I will see him back in 4 weeks for symptom recheck and if no better at that time, will pursue cystoscopy with Dr. Apolinar Junes.  He is in agreement with this plan. - Urinalysis, Complete - Bladder Scan (Post Void Residual) in office - tamsulosin (FLOMAX) 0.4 MG CAPS capsule; Take 1 capsule (0.4 mg total) by mouth daily.  Dispense: 30 capsule; Refill: 11  Return in about 4 weeks (around 02/05/2024) for Symptom recheck with PVR.  Carman Ching, PA-C  [facility] [place] [address] [place], [place] [address] [phone]

## 2024-01-12 ENCOUNTER — Other Ambulatory Visit: Payer: Self-pay

## 2024-01-12 ENCOUNTER — Emergency Department
Admission: EM | Admit: 2024-01-12 | Discharge: 2024-01-12 | Disposition: A | Discharge status: Home / Self Care | Payer: 59 | Attending: Emergency Medicine | Admitting: Emergency Medicine

## 2024-01-12 ENCOUNTER — Emergency Department: Payer: 59

## 2024-01-12 DIAGNOSIS — I10 Essential (primary) hypertension: Secondary | ICD-10-CM | POA: Diagnosis not present

## 2024-01-12 DIAGNOSIS — J45901 Unspecified asthma with (acute) exacerbation: Secondary | ICD-10-CM | POA: Diagnosis not present

## 2024-01-12 DIAGNOSIS — R0602 Shortness of breath: Secondary | ICD-10-CM

## 2024-01-12 DIAGNOSIS — J101 Influenza due to other identified influenza virus with other respiratory manifestations: Secondary | ICD-10-CM | POA: Diagnosis not present

## 2024-01-12 DIAGNOSIS — Z8546 Personal history of malignant neoplasm of prostate: Secondary | ICD-10-CM | POA: Insufficient documentation

## 2024-01-12 DIAGNOSIS — R059 Cough, unspecified: Secondary | ICD-10-CM

## 2024-01-12 LAB — BASIC METABOLIC PANEL
Anion gap: 10 (ref 5–15)
BUN: 22 mg/dL — ABNORMAL HIGH (ref 6–20)
CO2: 20 mmol/L — ABNORMAL LOW (ref 22–32)
Calcium: 9.3 mg/dL (ref 8.9–10.3)
Chloride: 107 mmol/L (ref 98–111)
Creatinine, Ser: 1.08 mg/dL (ref 0.61–1.24)
GFR, Estimated: >60 mL/min (ref >60)
Glucose, Bld: 112 mg/dL — ABNORMAL HIGH (ref 70–99)
Potassium: 3.8 mmol/L (ref 3.5–5.1)
Sodium: 137 mmol/L (ref 135–145)

## 2024-01-12 LAB — LACTIC ACID, PLASMA: Lactic Acid, Venous: 1 mmol/L (ref 0.5–1.9)

## 2024-01-12 LAB — RESP PANEL BY RT-PCR (RSV, FLU A&B, COVID)  RVPGX2
Influenza A by PCR: POSITIVE — AB
Influenza B by PCR: NEGATIVE
Resp Syncytial Virus by PCR: NEGATIVE
SARS Coronavirus 2 by RT PCR: NEGATIVE

## 2024-01-12 LAB — CBC
HCT: 42.2 % (ref 39.0–52.0)
Hemoglobin: 14.1 g/dL (ref 13.0–17.0)
MCH: 31.8 pg (ref 26.0–34.0)
MCHC: 33.4 g/dL (ref 30.0–36.0)
MCV: 95 fL (ref 80.0–100.0)
Platelets: 182 10*3/uL (ref 150–400)
RBC: 4.44 MIL/uL (ref 4.22–5.81)
RDW: 12.6 % (ref 11.5–15.5)
WBC: 4.4 10*3/uL (ref 4.0–10.5)
nRBC: 0 % (ref 0.0–0.2)

## 2024-01-12 LAB — BRAIN NATRIURETIC PEPTIDE: B Natriuretic Peptide: 57.1 pg/mL (ref 0.0–100.0)

## 2024-01-12 LAB — TROPONIN I (HIGH SENSITIVITY): Troponin I (High Sensitivity): 5 ng/L (ref <18)

## 2024-01-12 MED ORDER — IPRATROPIUM-ALBUTEROL 0.5-2.5 (3) MG/3ML IN SOLN
3.0000 mL | Freq: Once | RESPIRATORY_TRACT | Status: AC
  Administered 2024-01-12: 3 mL via RESPIRATORY_TRACT
  Filled 2024-01-12: qty 3

## 2024-01-12 MED ORDER — ALBUTEROL SULFATE HFA 108 (90 BASE) MCG/ACT IN AERS: 2.0000 | INHALATION_SPRAY | Freq: Four times a day (QID) | RESPIRATORY_TRACT | 2 refills | Status: AC | PRN

## 2024-01-12 MED ORDER — IPRATROPIUM-ALBUTEROL 0.5-2.5 (3) MG/3ML IN SOLN
6.0000 mL | Freq: Once | RESPIRATORY_TRACT | Status: AC
  Administered 2024-01-12: 6 mL via RESPIRATORY_TRACT
  Filled 2024-01-12: qty 6

## 2024-01-12 MED ORDER — ACETAMINOPHEN 325 MG PO TABS
650.0000 mg | ORAL_TABLET | Freq: Once | ORAL | Status: AC
  Administered 2024-01-12: 650 mg via ORAL
  Filled 2024-01-12: qty 2

## 2024-01-12 MED ORDER — PREDNISONE 20 MG PO TABS
40.0000 mg | ORAL_TABLET | Freq: Once | ORAL | Status: AC
  Administered 2024-01-12: 40 mg via ORAL
  Filled 2024-01-12: qty 2

## 2024-01-12 MED ORDER — PREDNISONE 20 MG PO TABS
40.0000 mg | ORAL_TABLET | Freq: Every day | ORAL | 0 refills | Status: DC
Start: 2024-01-13 — End: 2024-01-12
  Filled 2024-01-12: qty 8, 4d supply, fill #0

## 2024-01-12 MED ORDER — LACTATED RINGERS IV BOLUS
1000.0000 mL | Freq: Once | INTRAVENOUS | Status: AC
  Administered 2024-01-12: 1000 mL via INTRAVENOUS

## 2024-01-12 MED ORDER — PREDNISONE 20 MG PO TABS: 40.0000 mg | ORAL_TABLET | Freq: Every day | ORAL | 0 refills | Status: AC

## 2024-01-12 MED ORDER — ALBUTEROL SULFATE HFA 108 (90 BASE) MCG/ACT IN AERS
2.0000 | INHALATION_SPRAY | Freq: Four times a day (QID) | RESPIRATORY_TRACT | 2 refills | Status: DC | PRN
  Filled 2024-01-12: qty 8, 30d supply, fill #0

## 2024-01-12 NOTE — Discharge Instructions (Addendum)
 For your influenza, you can take ibuprofen 600 mg or Tylenol 650 mg every 6 hours as needed for headache and fever.  Please do not exceed 4000 mg of Tylenol in a 24-hour period.  Please also take the prednisone as prescribed.  You can use the inhaler, 2 puffs every 6 hours as needed for shortness of breath and wheezing.  I have placed a referral for you for primary care, please follow-up with a primary care doctor since you might need a baseline inhaler for your asthma.  Please plan to stay out of work until you are 24 hours after your last fever.

## 2024-01-12 NOTE — ED Provider Notes (Signed)
 [facility] Provider Note    Event Date/Time   First MD Initiated Contact with Patient 01/12/24 [phone]     (approximate)   History   Shortness of Breath   HPI  Frank Terry is a 58 y.o. male patient with history of asthma, not on any inhalers currently, GERD, hypertension, presenting with 1 week of cough.  States that he has been feeling also more short of breath, has some chest pain with the coughing.  Had a fever of Tmax 101 yesterday for which she took some Tylenol.  No nausea, vomiting, diarrhea, abdominal pain, back pain.  States that his asthma is related to allergies and he does not take a baseline inhaler.  Has not used an inhaler in a long time.  No history of CHF.  Denies any leg swelling.  Also with a headache but no vision changes or focal weakness or numbness.  On independent chart review he does have history of prostate cancer status post prostatectomy by urology.  Has chronic in frequency, incontinence and incomplete bladder emptying.  He has no dysuria at this time.     Physical Exam   Triage Vital Signs: ED Triage Vitals [01/12/24 0822]  Encounter Vitals Group     BP (!) 158/76     Systolic BP Percentile      Diastolic BP Percentile      Pulse Rate (!) 130     Resp 20     Temp 98.4 F (36.9 C)     Temp Source Oral     SpO2 95 %     Weight      Height      Head Circumference      Peak Flow      Pain Score 5     Pain Loc      Pain Education      Exclude from Growth Chart     Most recent vital signs: Vitals:   01/12/24 0930 01/12/24 1000  BP: (!) 140/72 127/79  Pulse: (!) 115 (!) 106  Resp: 19 19  Temp:    SpO2: 90% 94%     General: Awake, no distress.  CV:  Good peripheral perfusion.  Tachycardic Resp:  Normal effort.  Some sparse wheezing bilaterally with prolonged expiratory phase Abd:  No distention.  Soft nontender Other:  No lower extremity edema, no unilateral calf swelling or tenderness   ED Results /  Procedures / Treatments   Labs (all labs ordered are listed, but only abnormal results are displayed) Labs Reviewed  RESP PANEL BY RT-PCR (RSV, FLU A&B, COVID)  RVPGX2 - Abnormal; Notable for the following components:      Result Value   Influenza A by PCR POSITIVE (*)    All other components within normal limits  BASIC METABOLIC PANEL - Abnormal; Notable for the following components:   CO2 20 (*)    Glucose, Bld 112 (*)    BUN 22 (*)    All other components within normal limits  CBC  LACTIC ACID, PLASMA  BRAIN NATRIURETIC PEPTIDE  LACTIC ACID, PLASMA  TROPONIN I (HIGH SENSITIVITY)  TROPONIN I (HIGH SENSITIVITY)     EKG  Sinus tachycardia, rate 122, normal QRS, normal QTc, T wave inversion to aVL, no ischemic ST elevation, not significant change compared to prior   RADIOLOGY Chest x-ray on my interpretation without focal consolidation   PROCEDURES:  Critical Care performed: No  Procedures   MEDICATIONS ORDERED IN ED:  Medications  lactated ringers bolus 1,000 mL (1,000 mLs Intravenous New Bag/Given 01/12/24 0848)  ipratropium-albuterol (DUONEB) 0.5-2.5 (3) MG/3ML nebulizer solution 6 mL (6 mLs Nebulization Given 01/12/24 0857)  acetaminophen (TYLENOL) tablet 650 mg (650 mg Oral Given 01/12/24 0856)  lactated ringers bolus 1,000 mL (1,000 mLs Intravenous New Bag/Given 01/12/24 1011)  ipratropium-albuterol (DUONEB) 0.5-2.5 (3) MG/3ML nebulizer solution 3 mL (3 mLs Nebulization Given 01/12/24 1009)  predniSONE (DELTASONE) tablet 40 mg (40 mg Oral Given 01/12/24 1009)     IMPRESSION / MDM / ASSESSMENT AND PLAN / ED COURSE  I reviewed the triage vital signs and the nursing notes.                              Differential diagnosis includes, but is not limited to, pneumonia, viral illness such as influenza, COVID, RSV, ACS, considered CHF but patient does not appear fluid overloaded, considered asthma exacerbation.  Also considered PE but patient is not on active chemo at  this time, also symptoms more consistent with an infectious process.  No unilateral calf swelling or tenderness.  Will get labs, EKG, troponin, lactic acid, viral swab, will give him some IV fluids.  Will also give him a couple DuoNebs and reassess.  Patient's presentation is most consistent with acute presentation with potential threat to life or bodily function.  On reassessment patient is feeling a lot better, his heart rate is downtrending.  Last 1 was 106.  I believe he is safe for outpatient management today but no indication for inpatient admission at this time.  Will give him a prescription for albuterol, prednisone for 4 more days.  Will also put in referral for primary care.  Instructed him to stay out of work until he is 24 hours out from the last fever.  Will discharge with strict return precautions.  Clinical Course as of 01/12/24 1040  Sat Jan 12, 2024  0921 DG Chest [place] 1 View IMPRESSION: No active disease.   [TT]  [phone] On independent chart review, his lactate, BNP, troponin are not elevated.  His electrolytes are not severely deranged, creatinine is normal, no leukocytosis.  He is flu a positive. [TT]  [id number] On reassessment patient states he is feeling a lot better after the DuoNeb's.  Will give him 1 more DuoNeb as well as some steroids and treat his like asthma exacerbation.  Will also give him another liter of fluids given that his heart rate is still in the 110s.  Discussed with patient about his laboratory results including him being influenza positive.  He is agreeable plan for additional fluids as well as DuoNebs.  Suspect he will be able to improve here and be discharged home. [TT]    Clinical Course User Index [TT] Claybon Jabs, MD     FINAL CLINICAL IMPRESSION(S) / ED DIAGNOSES   Final diagnoses:  Exacerbation of asthma, unspecified asthma severity, unspecified whether persistent  Influenza A  Cough, unspecified type  Shortness of breath     Rx / DC Orders    ED Discharge Orders          Ordered    predniSONE (DELTASONE) 20 MG tablet  Daily        01/12/24 0948    Ambulatory Referral to Primary Care (Establish Care)        01/12/24 0947    albuterol (VENTOLIN HFA) 108 (90 Base) MCG/ACT inhaler  Every 6 hours PRN  01/12/24 0948             Note:  This document was prepared using Dragon voice recognition software and may include unintentional dictation errors.    Claybon Jabs, MD 01/12/24 [phone]

## 2024-01-12 NOTE — ED Triage Notes (Addendum)
 Pt to ED via POV from home. Pt ambulatory to triage. Pt reports SOB, fever, chest tightness and cough that started yesterday. Pt reports highest temp 101

## 2024-02-05 ENCOUNTER — Encounter: Payer: Self-pay | Admitting: Physician Assistant

## 2024-02-05 ENCOUNTER — Ambulatory Visit (INDEPENDENT_AMBULATORY_CARE_PROVIDER_SITE_OTHER): Payer: 59 | Admitting: Physician Assistant

## 2024-02-05 VITALS — BP 134/86 | HR 73 | Ht 69.0 in | Wt 199.0 lb

## 2024-02-05 DIAGNOSIS — R399 Unspecified symptoms and signs involving the genitourinary system: Secondary | ICD-10-CM

## 2024-02-05 LAB — BLADDER SCAN AMB NON-IMAGING: Scan Result: 75

## 2024-02-05 NOTE — Patient Instructions (Signed)

## 2024-02-05 NOTE — Progress Notes (Signed)
 02/05/2024 10:08 AM   Frank Terry [birth year] [id number]  CC: Chief Complaint  Patient presents with   Other   HPI: Frank Terry is a 58 y.o. male with PMH solitary right kidney, erectile dysfunction, and prostate cancer s/p prostatectomy with Dr. Apolinar Junes in July 2024 who completed pelvic floor PT with postprocedural LUTS who presents today for symptom recheck on Flomax.   Today he reports no change in his voiding symptoms on Flomax.  He feels like his urinary leakage initially improved with pelvic floor PT, however things have reverted back to how he was immediately postop.  He is continuing to do home pelvic PT exercises.  He specifically describes bothersome double voiding and the sensation of incomplete emptying.  PVR 75mL, previously 116 mL.  PMH: Past Medical History:  Diagnosis Date   Alcohol use disorder    pt currently lives at the [facility]   Allergy    Anxiety    Arthritis    Asthma    well controlled   Compression fracture of spine (HCC)    Congenital absence of one kidney    pt has right kidney   Depression    Gastric ulcer    GERD (gastroesophageal reflux disease)    no meds   Gout    HTN (hypertension)    Hyponatremia    Osteoporosis    Prostate cancer (HCC)    Ulcer    Vasitis 05/2023    Surgical History: Past Surgical History:  Procedure Laterality Date   COLONOSCOPY WITH ESOPHAGOGASTRODUODENOSCOPY (EGD)     MASS EXCISION Left 09/19/2023   Procedure: EXCISION MASS, deep soft tissue mass back;  Surgeon: [name], MD;  Location: [facility] ORS;  Service: General;  Laterality: Left;   PELVIC LYMPH NODE DISSECTION Bilateral 06/18/2023   Procedure: PELVIC LYMPH NODE DISSECTION;  Surgeon: Vanna Scotland, MD;  Location: [facility];  Service: Urology;  Laterality: Bilateral;   PROSTATE BIOPSY     ROBOT ASSISTED LAPAROSCOPIC RADICAL PROSTATECTOMY N/A 06/18/2023   Procedure: XI ROBOTIC ASSISTED LAPAROSCOPIC RADICAL PROSTATECTOMY;  Surgeon: Vanna Scotland, MD;  Location: [facility] OR[facility];  Service: Urology;  Laterality: N/A;   TONSILLECTOMY     age 88    Home Medications:  Allergies as of 02/05/2024       Reactions   Penicillins Rash        Medication List        Accurate as of February 05, 2024 10:08 AM. If you have any questions, ask your nurse or doctor.          acetaminophen 500 MG tablet Commonly known as: TYLENOL Take 1,000 mg by mouth every 6 (six) hours as needed.   albuterol 108 (90 Base) MCG/ACT inhaler Commonly known as: VENTOLIN HFA Inhale 2 puffs into the lungs every 6 (six) hours as needed for wheezing or shortness of breath.   cetirizine 10 MG tablet Commonly known as: ZYRTEC Take 10 mg by mouth daily. Unsure of dose   colchicine 0.6 MG tablet Take 0.6 mg by mouth as needed.   cyanocobalamin 1000 MCG tablet Commonly known as: VITAMIN B12 Take 1,000 mcg by mouth daily.   ibuprofen 800 MG tablet Commonly known as: ADVIL Take 1 tablet (800 mg total) by mouth every 8 (eight) hours as needed.   irbesartan 150 MG tablet Commonly known as: AVAPRO Take 1 tablet (150 mg total) by mouth daily.   NONFORMULARY OR COMPOUNDED ITEM Trimix (30/1/10)-(Pap/Phent/PGE)  Test Dose  1ml vial  Qty #3 Refills 0  [facility] [phone] Fax [phone]   sildenafil 100 MG tablet Commonly known as: VIAGRA Take 1 tablet (100 mg total) by mouth daily as needed for erectile dysfunction. What changed: when to take this   SUMAtriptan 50 MG tablet Commonly known as: Imitrex Take 1 tablet (50 mg total) by mouth every 2 (two) hours as needed for migraine. May repeat in 2 hours if headache persists or recurs. Do NOT take more than 2 tablets a day.   tamsulosin 0.4 MG Caps capsule Commonly known as: FLOMAX Take 1 capsule (0.4 mg total) by mouth daily.        Allergies:  Allergies  Allergen Reactions   Penicillins Rash    Family History: Family History  Problem Relation Age of Onset    Hypertension Mother    Lung cancer Mother    Osteoporosis Mother    Alcohol abuse Mother    Arthritis Mother    Cancer Mother    Heart disease Mother    Hypertension Father    Cancer Father        Abdominal   Heart attack Father    Hypertension Sister    Multiple sclerosis Sister     Social History:   reports that he has quit smoking. His smoking use included cigarettes. He started smoking about 41 years ago. He has quit using smokeless tobacco.  His smokeless tobacco use included chew. He reports that he does not currently use alcohol after a past usage of about 168.0 standard drinks of alcohol per week. He reports that he does not currently use drugs.  Physical Exam: BP 134/86   Pulse 73   Ht 5\' 9"  (1.753 m)   Wt 199 lb (90.3 kg)   BMI 29.39 kg/m   Constitutional:  Alert and oriented, no acute distress, nontoxic appearing HEENT: [place], AT Cardiovascular: No clubbing, cyanosis, or edema Respiratory: Normal respiratory effort, no increased work of breathing Skin: No rashes, bruises or suspicious lesions Neurologic: Grossly intact, no focal deficits, moving all 4 extremities Psychiatric: Normal mood and affect  Laboratory Data: Results for orders placed or performed in visit on 02/05/24  Bladder Scan (Post Void Residual) in office   Collection Time: 02/05/24 10:07 AM  Result Value Ref Range   Scan Result 75    Assessment & Plan:   1. Lower urinary tract symptoms (LUTS) (Primary) Obstructive symptoms not improved on Flomax.  I continue to question possible bladder neck contracture and recommended proceeding to cystoscopy.  He is in agreement with this plan.  Will stop Flomax. - Bladder Scan (Post Void Residual) in office   Return in about 2 months (around 04/06/2024) for Cysto with Dr. Apolinar Junes.  Carman Ching, PA-C  [facility] [place] [address], [address] [place], [place] [address] [phone]

## 2024-02-07 ENCOUNTER — Encounter: Payer: Self-pay | Admitting: Student

## 2024-02-07 ENCOUNTER — Ambulatory Visit (INDEPENDENT_AMBULATORY_CARE_PROVIDER_SITE_OTHER): Admitting: Student

## 2024-02-07 VITALS — BP 140/90 | HR 75 | Temp 97.8°F | Ht 69.0 in | Wt 199.4 lb

## 2024-02-07 DIAGNOSIS — R0600 Dyspnea, unspecified: Secondary | ICD-10-CM | POA: Diagnosis not present

## 2024-02-07 DIAGNOSIS — G4452 New daily persistent headache (NDPH): Secondary | ICD-10-CM

## 2024-02-07 MED ORDER — SUMATRIPTAN SUCCINATE 50 MG PO TABS
50.0000 mg | ORAL_TABLET | ORAL | 0 refills | Status: DC | PRN
Start: 2024-02-07 — End: 2024-03-10

## 2024-02-07 MED ORDER — FLUTICASONE-SALMETEROL 230-21 MCG/ACT IN AERO: 2.0000 | INHALATION_SPRAY | Freq: Two times a day (BID) | RESPIRATORY_TRACT | 12 refills | Status: DC

## 2024-02-07 MED ORDER — IRBESARTAN 150 MG PO TABS: 150.0000 mg | ORAL_TABLET | Freq: Every day | ORAL | 0 refills | Status: DC

## 2024-02-07 NOTE — Patient Instructions (Addendum)
 It was great to see you today! Thank you for choosing [facility] for your primary care.  Today we addressed: Advair -- 2 puffs twice daily  Can use albuterol every 4- hr as needed  We will order an echocardiogram to assess your heart and a chest xray I want you to schedule an appt in 2 weeks with Dr. Raymondo Band for lung function testing  Want you to be seen in 1-2 months with pcp for weight discussion and follow up on breathing   If you haven't already, sign up for My Chart to have easy access to your labs results, and communication with your primary care physician.   Please arrive 15 minutes before your appointment to ensure smooth check in process.  We appreciate your efforts in making this happen.  Thank you for allowing me to participate in your care, Frank Martinez, MD 02/07/2024, 9:15 AM PGY-3, [facility]

## 2024-02-07 NOTE — Progress Notes (Signed)
    SUBJECTIVE:   CHIEF COMPLAINT / HPI:   F/u from Flu  Medication Refill:  - Approximately one month ago, he experienced flu-like symptoms and was treated in the emergency room (+Flu A) with a rescue inhaler and prednisone.  - Despite completing the steroid treatment, he has persistent shortness of breath, particularly with minimal exertion such as walking a block or bending over.  - The shortness of breath is accompanied by ribcage pain during physical activities.  - He has asthma but had not required regular treatment for some time prior to this episode.  - He uses the rescue inhaler provided by the emergency room, which offers some relief but is insufficient for his current symptoms.  - He recalls previously using Advair with good results. No fever, chills, or heart problems.  - He quit smoking approximately eight months ago and is a recovering alcoholic.  - During his period of alcohol use, he neglected asthma management. He has year-round allergies and uses Zyrtec and Flonase regularly.   - He is currently taking irbesartan for hypertension and sumatriptan for migraines.   - He is concerned about recent weight gain, which he attributes to decreased physical activity following a prostatectomy.  - He does not know if he wakes up short of breath due to frequent bathroom trips at night.  PERTINENT  PMH / PSH: Prostate cancer s/p prostatectomy (05/2023), HTN, asthma, diverticulosis   OBJECTIVE:   BP (!) 130/90   Pulse 75   Temp 97.8 F (36.6 C) (Oral)   Ht 5\' 9"  (1.753 m)   Wt 199 lb 6 oz (90.4 kg)   SpO2 95%   BMI 29.44 kg/m   General: Alert and oriented in no apparent distress Heart: Regular rate and rhythm with no murmurs appreciated Lungs: CTA bilaterally, no wheezing Abdomen: Bowel sounds present, no abdominal pain Skin: Warm and dry Extremities: No lower extremity edema  ASSESSMENT/PLAN:   Assessment & Plan Dyspnea, unspecified type Prolonged exacerbation  post-flu with significant dyspnea. Rescue inhaler insufficient. Allergies may contribute. Former smoker and recovering alcoholic history noted. Chest x-ray to rule out pneumonia vs structural abn. Echo for heart function. Schedule with [facility] for PFTs for lung function testing. Reassured by oxygen saturation on RA. Exam overall unremarkable and vitals stable.  - Start Advair (fluticasone/salmeterol) daily. - Order chest x-ray. - cardiac evaluation via echo  - PFTs     Hypertension Requires irbesartan refill. Blood pressure 130/90 mmHg. Inconsistent medication adherence. - Refill irbesartan prescription.  Migraine Requires sumatriptan refill. Insurance covers nine tablets. - Refill sumatriptan prescription.  Weight gain post-prostatectomy and cancer treatment. Impact on health noted, especially with dyspnea and asthma. Future evaluation for sleep apnea considered. - Address weight management after resolving acute asthma exacerbation.  Alfredo Martinez, MD [facility]

## 2024-02-11 ENCOUNTER — Encounter (INDEPENDENT_AMBULATORY_CARE_PROVIDER_SITE_OTHER): Payer: Self-pay

## 2024-02-20 ENCOUNTER — Telehealth: Payer: Self-pay | Admitting: Cardiology

## 2024-02-20 NOTE — Telephone Encounter (Incomplete)
 Called to confirm/remind patient of their appointment at the [facility] on 02/21/24***.   Appointment:   [x] Confirmed  [] Left mess   [] No answer/No voice mail  [] Phone not in service  Patient reminded to bring all medications and/or complete list.  Confirmed patient has transportation. Gave directions, instructed to utilize valet parking.

## 2024-02-21 ENCOUNTER — Encounter: Payer: Self-pay | Admitting: Cardiology

## 2024-02-21 ENCOUNTER — Other Ambulatory Visit (HOSPITAL_COMMUNITY): Payer: Self-pay | Admitting: Cardiology

## 2024-02-21 ENCOUNTER — Inpatient Hospital Stay (HOSPITAL_COMMUNITY)
Admission: RE | Admit: 2024-02-21 | Discharge: 2024-02-21 | Disposition: A | Discharge status: Home / Self Care | Source: Ambulatory Visit | Attending: Cardiology | Admitting: Cardiology

## 2024-02-21 ENCOUNTER — Ambulatory Visit: Attending: Family Medicine | Admitting: Cardiology

## 2024-02-21 VITALS — BP 118/87 | HR 74 | Wt 203.0 lb

## 2024-02-21 DIAGNOSIS — Z5986 Financial insecurity: Secondary | ICD-10-CM | POA: Insufficient documentation

## 2024-02-21 DIAGNOSIS — Z8249 Family history of ischemic heart disease and other diseases of the circulatory system: Secondary | ICD-10-CM | POA: Insufficient documentation

## 2024-02-21 DIAGNOSIS — R079 Chest pain, unspecified: Secondary | ICD-10-CM | POA: Insufficient documentation

## 2024-02-21 DIAGNOSIS — Z87891 Personal history of nicotine dependence: Secondary | ICD-10-CM | POA: Insufficient documentation

## 2024-02-21 DIAGNOSIS — J45909 Unspecified asthma, uncomplicated: Secondary | ICD-10-CM | POA: Diagnosis not present

## 2024-02-21 DIAGNOSIS — Z5982 Transportation insecurity: Secondary | ICD-10-CM | POA: Diagnosis not present

## 2024-02-21 DIAGNOSIS — R002 Palpitations: Secondary | ICD-10-CM | POA: Diagnosis not present

## 2024-02-21 DIAGNOSIS — R0600 Dyspnea, unspecified: Secondary | ICD-10-CM | POA: Diagnosis not present

## 2024-02-21 NOTE — Progress Notes (Signed)
 Zio patch placed onto patient.  All instructions and information reviewed with patient, they verbalize understanding with no questions.

## 2024-02-21 NOTE — Patient Instructions (Addendum)
 Medication Changes:  No medication changes today!  Lab Work:  Go DOWN to LOWER LEVEL (LL) to have your blood work completed inside of [employer] office.   We will only call you if the results are abnormal or if the provider would like to make medication changes.   Testing/Procedures:  Your provider has recommended that  you wear a Zio Patch for 7 days.  This monitor will record your heart rhythm for our review.  IF you have any symptoms while wearing the monitor please press the button.  If you have any issues with the patch or you notice a red or orange light on it please call the company at [phone].  Once you remove the patch please mail it back to the company as soon as possible so we can get the results.   Your physician has requested that you have an echocardiogram. Echocardiography is a painless test that uses sound waves to create images of your heart. It provides your doctor with information about the size and shape of your heart and how well your heart's chambers and valves are working. This procedure takes approximately one hour. There are no restrictions for this procedure. Please do NOT wear cologne, perfume, aftershave, or lotions (deodorant is allowed). Please arrive 15 minutes prior to your appointment time.  Please note: We ask at that you not bring children with you during ultrasound (echo/ vascular) testing. Due to room size and safety concerns, children are not allowed in the ultrasound rooms during exams. Our front office staff cannot provide observation of children in our lobby area while testing is being conducted. An adult accompanying a patient to their appointment will only be allowed in the ultrasound room at the discretion of the ultrasound technician under special circumstances. We apologize for any inconvenience.  Please have your echo completed. You will check in for this at the [facility]. You have to arrive 15 MINS EARLY for preparation, otherwise you  will have to reschedule.   How to Prepare for Your Myoview Test (stress test):  Please do not take these medications before your test: n/a (please note if this is an exercise test pt should hold beta blocker prior) Your remaining medications may be taken with water. Nothing to eat or drink, except water, 4 hours prior to arrival time.  NO caffeine/decaffeinated products, or chocolate 12 hours prior to arrival. [place], please do not wear dresses.  Skirts or pants are approprate, please wear a short sleeve shirt. NO perfume, cologne or lotion Wear comfortable walking shoes.  NO HEELS! Total time is 3 to 4 hours; you may want to bring reading material for the waiting time. Please report to [facility] for your test  What to expect after you arrive:  Once you arrive and check in for your appointment an IV will be started in your arm.  Then the Technoligist will inject a small amount of radioactive tracer.  There will be a 1 hour waiting period after this injection.  A series of pictures will be taken of your heart following this waiting period.  You will be prepped for the stress portion of the test.  During the stress portion of your test you will either walk on a treadmill or receive a small, safe amount of radioactive tracer injected in your IV.  After the stress portion, there is a short rest period during which time your heart and blood pressure will be monitored.  After the short rest period the Technologist will begin  your second set of pictures.  Your doctor will inform you of your test results within 7-10 business days.  In preparation for your appointment, medication and supplies will be purchased.  Appointment availability is limited, so if you need to cancel or reschedule please call the office at [phone] 24 hours in advance to avoid a cancellation fee of $100.00  IF YOU THINK YOU MAY BE PREGNANT, PLEASE INFORM THE TECHNOLOGIST.    Follow-Up in: Please follow up with the Heart  Failure Clinic in 1 month with Dr. Shirlee Latch.  At the [facility], you and your health needs are our priority. We have a designated team specialized in the treatment of Heart Failure. This Care Team includes your primary Heart Failure Specialized Cardiologist (physician), Advanced Practice Providers (APPs- Physician Assistants and Nurse Practitioners), and Pharmacist who all work together to provide you with the care you need, when you need it.   You may see any of the following providers on your designated Care Team at your next follow up:  Dr. Arvilla Meres Dr. Marca Ancona Dr. Dorthula Nettles Dr. Theresia Bough Clarisa Kindred, FNP Enos Fling, RPH-CPP  Please be sure to bring in all your medications bottles to every appointment.   Need to Contact us:  If you have any questions or concerns before your next appointment please send [place] a message through [place] or call our office at [phone].    TO LEAVE A MESSAGE FOR THE NURSE SELECT OPTION 2, PLEASE LEAVE A MESSAGE INCLUDING: YOUR NAME DATE OF BIRTH CALL BACK NUMBER REASON FOR CALL**this is important as we prioritize the call backs  YOU WILL RECEIVE A CALL BACK THE SAME DAY AS LONG AS YOU CALL BEFORE 4:00 PM

## 2024-02-21 NOTE — Progress Notes (Signed)
 PCP: Elberta Fortis, MD Cardiology: Dr. Shirlee Latch   58 y.o. with history of HTN and asthma was referred by Dr. Ardyth Harps for evaluation of palpitations, dyspnea, and chest tightness.  Patient has a long history of wheezing due to asthma.  He was in the ER in 2/25 with dyspnea and chest tightness.  Troponin was negative and he was found to have influenza A.  He does not feels like he has ever completely recovered. He is very fatigued.  He notes bendopnea (since even before his ER trip) though no orthopnea.  He is short of breath walking 2-3 blocks.  He will also note tightness across his chest when he gets short of breath.  He is not clearly wheezing with these episodes. He is also short of breath with moderate exertion like cleaning up the house or making up his bed.   Patient reports a sensation of heart racing and "beating hard" at times, especially when he lies down at night.  He will often notice it with rest, not with exertion. Palpitations occur daily.  He will feel mildly lightheaded when his heart is racing.  No syncope or presyncope.    Patient's father had an MI at 25, multiple aunts and uncles with MIs and both grandfathers with MIs.  He quit both smoking and ETOH about a year ago. Of note, his PET-CT last year showed coronary calcium.   ECG (personally reviewed): NSR, normal  Labs (2/25): BNP 57, lactate normal, TnI normal, K 3.8, creatinine 1.08  PMH: 1. Asthma 2. HTN 3. Prior ETOH abuse: No ETOH since early 2024.  4. Prior smoker: Quit 2024.  5. Migraines 6. Prostate cancer: s/p prostatectomy in 7/24.  7. Solitary right kidney  FH: Father with MI at 60, aunts/uncles with MIs, grandfathers with MIs.   Social History   Socioeconomic History   Marital status: Divorced    Spouse name: Not on file   Number of children: Not on file   Years of education: Not on file   Highest education level: Some college, no degree  Occupational History   Not on file  Tobacco Use   Smoking  status: Former    Types: Cigarettes    Start date: 1984   Smokeless tobacco: Former    Types: Chew   Tobacco comments:    Nicotine patches   Vaping Use   Vaping status: Never Used  Substance and Sexual Activity   Alcohol use: Not Currently    Alcohol/week: 168.0 standard drinks of alcohol    Types: 168 Cans of beer per week    Comment: pt lives in the [facility] home for alcohol addiction   Drug use: Not Currently   Sexual activity: Yes    Birth control/protection: None  Other Topics Concern   Not on file  Social History Narrative   Not on file   Social Drivers of Health   Financial Resource Strain: Medium Risk (11/18/2023)   Overall Financial Resource Strain (CARDIA)    Difficulty of Paying Living Expenses: Somewhat hard  Food Insecurity: Food Insecurity Present (11/18/2023)   Hunger Vital Sign    Worried About Running Out of Food in the Last Year: Often true    Ran Out of Food in the Last Year: Often true  Transportation Needs: Unmet Transportation Needs (11/18/2023)   PRAPARE - Transportation    Lack of Transportation (Medical): Yes    Lack of Transportation (Non-Medical): Yes  Physical Activity: Insufficiently Active (11/18/2023)   Exercise Vital Sign  Days of Exercise per Week: 4 days    Minutes of Exercise per Session: 10 min  Stress: No Stress Concern Present (11/18/2023)   [employer] of Occupational Health - Occupational Stress Questionnaire    Feeling of Stress : Not at all  Social Connections: Moderately Isolated (11/18/2023)   Social Connection and Isolation Panel [NHANES]    Frequency of Communication with Friends and Family: Three times a week    Frequency of Social Gatherings with Friends and Family: More than three times a week    Attends Religious Services: Never    Database administrator or Organizations: Yes    Attends Banker Meetings: 1 to 4 times per year    Marital Status: Divorced  Intimate Partner Violence: Not At Risk  (06/18/2023)   Humiliation, Afraid, Rape, and Kick questionnaire    Fear of Current or Ex-Partner: No    Emotionally Abused: No    Physically Abused: No    Sexually Abused: No   ROS: All systems reviewed and negative except as per HPI.   Current Outpatient Medications  Medication Sig Dispense Refill   acetaminophen (TYLENOL) 500 MG tablet Take 1,000 mg by mouth every 6 (six) hours as needed.     albuterol (VENTOLIN HFA) 108 (90 Base) MCG/ACT inhaler Inhale 2 puffs into the lungs every 6 (six) hours as needed for wheezing or shortness of breath. 8 g 2   cetirizine (ZYRTEC) 10 MG tablet Take 10 mg by mouth daily. Unsure of dose     colchicine 0.6 MG tablet Take 0.6 mg by mouth as needed.     fluticasone-salmeterol (ADVAIR HFA) 230-21 MCG/ACT inhaler Inhale 2 puffs into the lungs 2 (two) times daily. 1 each 12   ibuprofen (ADVIL) 800 MG tablet Take 1 tablet (800 mg total) by mouth every 8 (eight) hours as needed. 30 tablet 0   irbesartan (AVAPRO) 150 MG tablet Take 1 tablet (150 mg total) by mouth daily. 90 tablet 0   sildenafil (VIAGRA) 100 MG tablet Take 1 tablet (100 mg total) by mouth daily as needed for erectile dysfunction. 30 tablet 11   SUMAtriptan (IMITREX) 50 MG tablet Take 1 tablet (50 mg total) by mouth every 2 (two) hours as needed for migraine. May repeat in 2 hours if headache persists or recurs. Do NOT take more than 2 tablets a day. 15 tablet 0   No current facility-administered medications for this visit.   BP 118/87   Pulse 74   Wt 203 lb (92.1 kg)   SpO2 98%   BMI 29.98 kg/m  General: NAD Neck: No JVD, no thyromegaly or thyroid nodule.  Lungs: No wheezing but prolonged expiratory phase.  CV: Nondisplaced PMI.  Heart regular S1/S2, no S3/S4, no murmur.  No peripheral edema.  No carotid bruit.  Normal pedal pulses.  Abdomen: Soft, nontender, no hepatosplenomegaly, no distention.  Skin: Intact without lesions or rashes.  Neurologic: Alert and oriented x 3.  Psych:  Normal affect. Extremities: No clubbing or cyanosis.  HEENT: Normal.   Assessment/Plan: 1. Exertional dyspnea/chest pain: This has been present for several months but worse for several weeks.  He thinks that this is different from his usual asthma. ECG today was normal.  He has a family history of CAD, no prior personal cardiac history. Prior smoker but quit about a year ago.  He had some coronary calcium on his PET-CT in 5/24.  Exam is unremarkable.  - He will need evaluation for ischemia. I  will arrange for ETT-Cardiolite.  - I will arrange for echocardiogram.  - Check lipids and Lp(a).  - PCP has ordered PFTs.  2.  Palpitations: Patient has noted tachypalpitations, occasional lightheadedness but no syncope. ?PVCs.  - I will arrange for 7 day Zio monitor.   Followup 1 month.   I spent 56 minutes reviewing records, interviewing/examining patient, and managing orders.   Marca Ancona 02/21/2024

## 2024-02-21 NOTE — Addendum Note (Signed)
 Encounter addended by: Nicole Cella, RN on: 02/21/2024 1:54 PM  Actions taken: Imaging Exam begun

## 2024-02-22 LAB — LIPID PANEL
Chol/HDL Ratio: 5.3 ratio — ABNORMAL HIGH (ref 0.0–5.0)
Cholesterol, Total: 260 mg/dL — ABNORMAL HIGH (ref 100–199)
HDL: 49 mg/dL (ref >39)
LDL Chol Calc (NIH): 187 mg/dL — ABNORMAL HIGH (ref 0–99)
Triglycerides: 134 mg/dL (ref 0–149)
VLDL Cholesterol Cal: 24 mg/dL (ref 5–40)

## 2024-02-22 LAB — LIPOPROTEIN A (LPA): Lipoprotein (a): 20.9 nmol/L (ref <75.0)

## 2024-02-26 ENCOUNTER — Encounter: Payer: Self-pay | Admitting: Family

## 2024-02-26 ENCOUNTER — Ambulatory Visit: Attending: Family | Admitting: Family

## 2024-02-26 VITALS — BP 149/97 | HR 75 | Wt 204.4 lb

## 2024-02-26 DIAGNOSIS — Z8546 Personal history of malignant neoplasm of prostate: Secondary | ICD-10-CM | POA: Insufficient documentation

## 2024-02-26 DIAGNOSIS — J45909 Unspecified asthma, uncomplicated: Secondary | ICD-10-CM | POA: Insufficient documentation

## 2024-02-26 DIAGNOSIS — E782 Mixed hyperlipidemia: Secondary | ICD-10-CM | POA: Diagnosis not present

## 2024-02-26 DIAGNOSIS — R002 Palpitations: Secondary | ICD-10-CM | POA: Diagnosis not present

## 2024-02-26 DIAGNOSIS — I10 Essential (primary) hypertension: Secondary | ICD-10-CM

## 2024-02-26 DIAGNOSIS — Z87891 Personal history of nicotine dependence: Secondary | ICD-10-CM | POA: Diagnosis not present

## 2024-02-26 DIAGNOSIS — R0602 Shortness of breath: Secondary | ICD-10-CM | POA: Diagnosis present

## 2024-02-26 DIAGNOSIS — F1011 Alcohol abuse, in remission: Secondary | ICD-10-CM | POA: Insufficient documentation

## 2024-02-26 DIAGNOSIS — R079 Chest pain, unspecified: Secondary | ICD-10-CM

## 2024-02-26 DIAGNOSIS — Z905 Acquired absence of kidney: Secondary | ICD-10-CM | POA: Diagnosis not present

## 2024-02-26 DIAGNOSIS — E785 Hyperlipidemia, unspecified: Secondary | ICD-10-CM | POA: Insufficient documentation

## 2024-02-26 MED ORDER — SPIRONOLACTONE 25 MG PO TABS: 25.0000 mg | ORAL_TABLET | Freq: Every day | ORAL | 3 refills | Status: DC

## 2024-02-26 MED ORDER — ROSUVASTATIN CALCIUM 20 MG PO TABS: 20.0000 mg | ORAL_TABLET | Freq: Every day | ORAL | 3 refills | Status: DC

## 2024-02-26 NOTE — Progress Notes (Signed)
 [facility] Note    PCP: Elberta Fortis, MD Cardiologist: Marca Ancona, MD  Chief Complaint: shortness of breath  HPI:  [name] is a 58 y/o male with a history of asthma, HTN, Prior ETOH abuse: No ETOH since early 2024, Prior smoker: Quit 2024, migraines, prostate cancer: s/p prostatectomy 07/24 and solitary right kidney.  Patient has a long history of wheezing due to asthma. He was in the ER 01/12/24 with dyspnea and chest tightness. Troponin was negative and he was found to have influenza A.   He presents today for an acute visit with a chief complaint of shortness of breath. Has associated fatigue, dizziness, abdominal distention (worsening), pedal edema (worsening), bilateral knee ache, occasional chest pain/ palpitations and some nausea. Wears depends due to prostate surgery 07/24  Feels constantly thirsty and estimates that he's drinking 10-15 water bottles daily. Does like to use "a lot" of salt in his food.   Patient's father had an MI at 38, multiple aunts and uncles with MIs and both grandfathers with MIs.  He quit both smoking and ETOH ~ 2024. Of note, his PET-CT last year showed coronary calcium.   ROS: All systems negative except what is listed in HPI, PMH and Problem List   Past Medical History:  Diagnosis Date   Alcohol use disorder    pt currently lives at the [facility]   Allergy    Anxiety    Arthritis    Asthma    well controlled   Compression fracture of spine (HCC)    Congenital absence of one kidney    pt has right kidney   Depression    Gastric ulcer    GERD (gastroesophageal reflux disease)    no meds   Gout    HTN (hypertension)    Hyponatremia    Osteoporosis    Prostate cancer (HCC)    Ulcer    Vasitis 05/2023    Current Outpatient Medications  Medication Sig Dispense Refill   acetaminophen (TYLENOL) 500 MG tablet Take 1,000 mg by mouth every 6 (six) hours as needed.     albuterol (VENTOLIN HFA) 108 (90 Base)  MCG/ACT inhaler Inhale 2 puffs into the lungs every 6 (six) hours as needed for wheezing or shortness of breath. 8 g 2   cetirizine (ZYRTEC) 10 MG tablet Take 10 mg by mouth daily. Unsure of dose     colchicine 0.6 MG tablet Take 0.6 mg by mouth as needed.     fluticasone-salmeterol (ADVAIR HFA) 230-21 MCG/ACT inhaler Inhale 2 puffs into the lungs 2 (two) times daily. 1 each 12   ibuprofen (ADVIL) 800 MG tablet Take 1 tablet (800 mg total) by mouth every 8 (eight) hours as needed. 30 tablet 0   irbesartan (AVAPRO) 150 MG tablet Take 1 tablet (150 mg total) by mouth daily. 90 tablet 0   sildenafil (VIAGRA) 100 MG tablet Take 1 tablet (100 mg total) by mouth daily as needed for erectile dysfunction. 30 tablet 11   SUMAtriptan (IMITREX) 50 MG tablet Take 1 tablet (50 mg total) by mouth every 2 (two) hours as needed for migraine. May repeat in 2 hours if headache persists or recurs. Do NOT take more than 2 tablets a day. 15 tablet 0   No current facility-administered medications for this visit.    Allergies  Allergen Reactions   Penicillins Rash      Social History   Socioeconomic History   Marital status: Divorced    Spouse  name: Not on file   Number of children: Not on file   Years of education: Not on file   Highest education level: Some college, no degree  Occupational History   Not on file  Tobacco Use   Smoking status: Former    Types: Cigarettes    Start date: 1984   Smokeless tobacco: Former    Types: Chew   Tobacco comments:    Nicotine patches   Vaping Use   Vaping status: Never Used  Substance and Sexual Activity   Alcohol use: Not Currently    Alcohol/week: 168.0 standard drinks of alcohol    Types: 168 Cans of beer per week    Comment: pt lives in the [facility] home for alcohol addiction   Drug use: Not Currently   Sexual activity: Yes    Birth control/protection: None  Other Topics Concern   Not on file  Social History Narrative   Not on file   Social Drivers  of Health   Financial Resource Strain: Medium Risk (11/18/2023)   Overall Financial Resource Strain (CARDIA)    Difficulty of Paying Living Expenses: Somewhat hard  Food Insecurity: Food Insecurity Present (11/18/2023)   Hunger Vital Sign    Worried About Running Out of Food in the Last Year: Often true    Ran Out of Food in the Last Year: Often true  Transportation Needs: Unmet Transportation Needs (11/18/2023)   PRAPARE - Administrator, Civil Service (Medical): Yes    Lack of Transportation (Non-Medical): Yes  Physical Activity: Insufficiently Active (11/18/2023)   Exercise Vital Sign    Days of Exercise per Week: 4 days    Minutes of Exercise per Session: 10 min  Stress: No Stress Concern Present (11/18/2023)   [employer] of Occupational Health - Occupational Stress Questionnaire    Feeling of Stress : Not at all  Social Connections: Moderately Isolated (11/18/2023)   Social Connection and Isolation Panel [NHANES]    Frequency of Communication with Friends and Family: Three times a week    Frequency of Social Gatherings with Friends and Family: More than three times a week    Attends Religious Services: Never    Database administrator or Organizations: Yes    Attends Banker Meetings: 1 to 4 times per year    Marital Status: Divorced  Intimate Partner Violence: Not At Risk (06/18/2023)   Humiliation, Afraid, Rape, and Kick questionnaire    Fear of Current or Ex-Partner: No    Emotionally Abused: No    Physically Abused: No    Sexually Abused: No      Family History  Problem Relation Age of Onset   Hypertension Mother    Lung cancer Mother    Osteoporosis Mother    Alcohol abuse Mother    Arthritis Mother    Cancer Mother    Heart disease Mother    Hypertension Father    Cancer Father        Abdominal   Heart attack Father    Hypertension Sister    Multiple sclerosis Sister    Vitals:   02/26/24 1139  BP: (!) 149/97  Pulse: 75   SpO2: 99%  Weight: 204 lb 6.4 oz (92.7 kg)   Wt Readings from Last 3 Encounters:  02/26/24 204 lb 6.4 oz (92.7 kg)  02/21/24 203 lb (92.1 kg)  02/07/24 199 lb 6 oz (90.4 kg)   Lab Results  Component Value Date   CREATININE 1.08  01/12/2024   CREATININE 1.08 09/25/2023   CREATININE 1.03 07/14/2023    PHYSICAL EXAM:  General: Well appearing. No resp difficulty HEENT: normal Neck: supple, JVD elevated to jawline Cor: Regular rhythm, rate. No rubs, gallops or murmurs Lungs: clear Abdomen: soft, nontender, distended. Extremities: no cyanosis, clubbing, rash, trace pitting edema bilateral shins Neuro: alert & oriented X 3. Moves all 4 extremities w/o difficulty. Affect pleasant   ECG: NSR with HR 74 (personally reviewed)  ReDs reading: 41 %, abnormal (see plan below)   ASSESSMENT & PLAN:  1. Exertional dyspnea/chest pain: This has been present for several months but worsening from his last visit here last week. - He has a family history of CAD, no prior personal cardiac history. Prior smoker but quit about a year ago. He had some coronary calcium on his PET-CT in 5/24.   - NYHA class III - fluid up with worsening symptoms and elevated ReDs reading - ReDs elevated at 41% - begin spironolactone 25mg  daily - BMET next week - weight up 1 pound from last visit here 5 days ago - ETT-Cardiolite to be done; awaiting on insurance approval - Echo to be done 03/27/24; will see if this can be moved up - continue irbesartan 150mg  daily - decrease fluid intake to 60-64 oz daily; estimates that he currently drinks 160-240 oz daily; reviewed using hard candy/ gum to moisten mouth - reviewed decreasing sodium consumption - PCP has ordered PFTs.   2.  Palpitations:  - Patient has noted tachypalpitations, occasional lightheadedness but no syncope. ?PVCs.  - currently wearing 7 day zio monitor - EKG today is NSR  3: HTN: - BP 149/97 - starting spironolactone 25mg  daily - BMET next  week - continue irbesartan 150mg  daily - BMET 01/12/24 reviewed: sodium 137, potassium 3.8, creatinine 1.08 & GFR >60  4: Hyperlipidemia- - LDL 02/21/24 was 187 - lipo (a) 02/21/24 was 20.9 - begin crestor 20mg  daily  - recheck lipids ~ 06/25    Return in 1 week, sooner if needed.   Delma Freeze, FNP 02/26/24

## 2024-02-26 NOTE — Patient Instructions (Signed)
 Medication Changes:  START Spironolactone 25mg  (1 tab) daily START Rosuvastatin 20mg  (1 tab) daily  Lab Work:  No lab work today!  Special Instructions // Education:  PLEASE LIMIT fluid intake to 60-64oz daily  PLEASE LIMIT sodium intake  Follow-Up in: Please follow up with the [facility] in 1 week with Clarisa Kindred, NP.  At the [facility], you and your health needs are our priority. We have a designated team specialized in the treatment of Heart Failure. This Care Team includes your primary Heart Failure Specialized Cardiologist (physician), Advanced Practice Providers (APPs- Physician Assistants and Nurse Practitioners), and Pharmacist who all work together to provide you with the care you need, when you need it.   You may see any of the following providers on your designated Care Team at your next follow up:  Dr. Arvilla Meres Dr. Marca Ancona Dr. Dorthula Nettles Dr. Theresia Bough Clarisa Kindred, FNP Enos Fling, RPH-CPP  Please be sure to bring in all your medications bottles to every appointment.   Need to Contact us:  If you have any questions or concerns before your next appointment please send [place] a message through [place] or call our office at [phone].    TO LEAVE A MESSAGE FOR THE NURSE SELECT OPTION 2, PLEASE LEAVE A MESSAGE INCLUDING: YOUR NAME DATE OF BIRTH CALL BACK NUMBER REASON FOR CALL**this is important as we prioritize the call backs  YOU WILL RECEIVE A CALL BACK THE SAME DAY AS LONG AS YOU CALL BEFORE 4:00 PM

## 2024-02-26 NOTE — Progress Notes (Signed)
 ReDS Vest / Clip - 02/26/24 1139       ReDS Vest / Clip   Station Marker D    Ruler Value 37    ReDS Value Range High volume overload    ReDS Actual Value 41

## 2024-02-29 ENCOUNTER — Telehealth: Payer: Self-pay | Admitting: Family

## 2024-02-29 ENCOUNTER — Other Ambulatory Visit (HOSPITAL_COMMUNITY): Payer: Self-pay | Admitting: Cardiology

## 2024-02-29 ENCOUNTER — Inpatient Hospital Stay (HOSPITAL_COMMUNITY)
Admission: RE | Admit: 2024-02-29 | Discharge: 2024-02-29 | Disposition: A | Discharge status: Home / Self Care | Source: Ambulatory Visit | Attending: Cardiology | Admitting: Cardiology

## 2024-02-29 ENCOUNTER — Telehealth (HOSPITAL_COMMUNITY): Payer: Self-pay | Admitting: Surgery

## 2024-02-29 DIAGNOSIS — R002 Palpitations: Secondary | ICD-10-CM

## 2024-02-29 NOTE — Telephone Encounter (Signed)
 I called patient after receiving information that the Zio monitor had expired before or during his current wear.  I informed him that we will order a new device if indicated.  He has completed the study and intends to send the expired device back today.

## 2024-02-29 NOTE — Telephone Encounter (Incomplete)
 Called to confirm/remind patient of their appointment at the [facility] on 03/03/24***.   Appointment:   [x] Confirmed  [] Left mess   [] No answer/No voice mail  [] Phone not in service  Patient reminded to bring all medications and/or complete list.  Confirmed patient has transportation. Gave directions, instructed to utilize valet parking.

## 2024-03-01 NOTE — Progress Notes (Unsigned)
 [facility] Note    PCP: Jonne Netters, MD Cardiologist: Peder Bourdon, MD  Chief Complaint: shortness of breath  HPI:  [name] is a 58 y/o male with a history of asthma, HTN, Prior ETOH abuse: No ETOH since early 2024, Prior smoker: Quit 2024, migraines, prostate cancer: s/p prostatectomy 07/24 and solitary right kidney.  Patient has a long history of wheezing due to asthma. He was in the ER 01/12/24 with dyspnea and chest tightness. Troponin was negative and he was found to have influenza A.   He presents today for an acute visit with a chief complaint of shortness of breath. Has associated fatigue, dizziness, abdominal distention (worsening), pedal edema (worsening), bilateral knee ache, occasional chest pain/ palpitations and some nausea. Wears depends due to prostate surgery 07/24  Feels constantly thirsty and estimates that he's drinking 10-15 water bottles daily. Does like to use "a lot" of salt in his food.   Patient's father had an MI at 53, multiple aunts and uncles with MIs and both grandfathers with MIs.  He quit both smoking and ETOH ~ 2024. Of note, his PET-CT last year showed coronary calcium.   ROS: All systems negative except what is listed in HPI, PMH and Problem List   Past Medical History:  Diagnosis Date   Alcohol use disorder    pt currently lives at the [facility]   Allergy    Anxiety    Arthritis    Asthma    well controlled   Compression fracture of spine (HCC)    Congenital absence of one kidney    pt has right kidney   Depression    Gastric ulcer    GERD (gastroesophageal reflux disease)    no meds   Gout    HTN (hypertension)    Hyponatremia    Osteoporosis    Prostate cancer (HCC)    Ulcer    Vasitis 05/2023    Current Outpatient Medications  Medication Sig Dispense Refill   acetaminophen (TYLENOL) 500 MG tablet Take 1,000 mg by mouth every 6 (six) hours as needed.     albuterol (VENTOLIN HFA) 108 (90 Base)  MCG/ACT inhaler Inhale 2 puffs into the lungs every 6 (six) hours as needed for wheezing or shortness of breath. 8 g 2   cetirizine (ZYRTEC) 10 MG tablet Take 10 mg by mouth daily. Unsure of dose     colchicine 0.6 MG tablet Take 0.6 mg by mouth as needed.     fluticasone-salmeterol (ADVAIR HFA) 230-21 MCG/ACT inhaler Inhale 2 puffs into the lungs 2 (two) times daily. 1 each 12   ibuprofen (ADVIL) 800 MG tablet Take 1 tablet (800 mg total) by mouth every 8 (eight) hours as needed. 30 tablet 0   irbesartan (AVAPRO) 150 MG tablet Take 1 tablet (150 mg total) by mouth daily. 90 tablet 0   rosuvastatin (CRESTOR) 20 MG tablet Take 1 tablet (20 mg total) by mouth daily. 90 tablet 3   sildenafil (VIAGRA) 100 MG tablet Take 1 tablet (100 mg total) by mouth daily as needed for erectile dysfunction. 30 tablet 11   spironolactone (ALDACTONE) 25 MG tablet Take 1 tablet (25 mg total) by mouth daily. 90 tablet 3   SUMAtriptan (IMITREX) 50 MG tablet Take 1 tablet (50 mg total) by mouth every 2 (two) hours as needed for migraine. May repeat in 2 hours if headache persists or recurs. Do NOT take more than 2 tablets a day. 15 tablet 0  No current facility-administered medications for this visit.    Allergies  Allergen Reactions   Penicillins Rash      Social History   Socioeconomic History   Marital status: Divorced    Spouse name: Not on file   Number of children: Not on file   Years of education: Not on file   Highest education level: Some college, no degree  Occupational History   Not on file  Tobacco Use   Smoking status: Former    Types: Cigarettes    Start date: 1984   Smokeless tobacco: Former    Types: Chew   Tobacco comments:    Nicotine patches   Vaping Use   Vaping status: Never Used  Substance and Sexual Activity   Alcohol use: Not Currently    Alcohol/week: 168.0 standard drinks of alcohol    Types: 168 Cans of beer per week    Comment: pt lives in the [facility] for alcohol  addiction   Drug use: Not Currently   Sexual activity: Yes    Birth control/protection: None  Other Topics Concern   Not on file  Social History Narrative   Not on file   Social Drivers of Health   Financial Resource Strain: Medium Risk (11/18/2023)   Overall Financial Resource Strain (CARDIA)    Difficulty of Paying Living Expenses: Somewhat hard  Food Insecurity: Food Insecurity Present (11/18/2023)   Hunger Vital Sign    Worried About Running Out of Food in the Last Year: Often true    Ran Out of Food in the Last Year: Often true  Transportation Needs: Unmet Transportation Needs (11/18/2023)   PRAPARE - Administrator, Civil Service (Medical): Yes    Lack of Transportation (Non-Medical): Yes  Physical Activity: Insufficiently Active (11/18/2023)   Exercise Vital Sign    Days of Exercise per Week: 4 days    Minutes of Exercise per Session: 10 min  Stress: No Stress Concern Present (11/18/2023)   [employer] of Occupational Health - Occupational Stress Questionnaire    Feeling of Stress : Not at all  Social Connections: Moderately Isolated (11/18/2023)   Social Connection and Isolation Panel [NHANES]    Frequency of Communication with Friends and Family: Three times a week    Frequency of Social Gatherings with Friends and Family: More than three times a week    Attends Religious Services: Never    Database administrator or Organizations: Yes    Attends Banker Meetings: 1 to 4 times per year    Marital Status: Divorced  Intimate Partner Violence: Not At Risk (06/18/2023)   Humiliation, Afraid, Rape, and Kick questionnaire    Fear of Current or Ex-Partner: No    Emotionally Abused: No    Physically Abused: No    Sexually Abused: No      Family History  Problem Relation Age of Onset   Hypertension Mother    Lung cancer Mother    Osteoporosis Mother    Alcohol abuse Mother    Arthritis Mother    Cancer Mother    Heart disease Mother     Hypertension Father    Cancer Father        Abdominal   Heart attack Father    Hypertension Sister    Multiple sclerosis Sister    There were no vitals filed for this visit.  Wt Readings from Last 3 Encounters:  02/26/24 204 lb 6.4 oz (92.7 kg)  02/21/24 203 lb (  92.1 kg)  02/07/24 199 lb 6 oz (90.4 kg)   Lab Results  Component Value Date   CREATININE 1.08 01/12/2024   CREATININE 1.08 09/25/2023   CREATININE 1.03 07/14/2023    PHYSICAL EXAM:  General: Well appearing. No resp difficulty HEENT: normal Neck: supple, JVD elevated to jawline Cor: Regular rhythm, rate. No rubs, gallops or murmurs Lungs: clear Abdomen: soft, nontender, distended. Extremities: no cyanosis, clubbing, rash, trace pitting edema bilateral shins Neuro: alert & oriented X 3. Moves all 4 extremities w/o difficulty. Affect pleasant   ECG: NSR with HR 74 (personally reviewed)  ReDs reading: 41 %, abnormal (see plan below)   ASSESSMENT & PLAN:  1. Exertional dyspnea/chest pain: This has been present for several months but worsening from his last visit here last week. - He has a family history of CAD, no prior personal cardiac history. Prior smoker but quit about a year ago. He had some coronary calcium on his PET-CT in 5/24.   - NYHA class III - fluid up with worsening symptoms and elevated ReDs reading - ReDs elevated at 41% - begin spironolactone 25mg  daily - BMET next week - weight up 1 pound from last visit here 5 days ago - ETT-Cardiolite to be done; awaiting on insurance approval - Echo to be done 03/27/24; will see if this can be moved up - continue irbesartan 150mg  daily - decrease fluid intake to 60-64 oz daily; estimates that he currently drinks 160-240 oz daily; reviewed using hard candy/ gum to moisten mouth - reviewed decreasing sodium consumption - PCP has ordered PFTs.   2.  Palpitations:  - Patient has noted tachypalpitations, occasional lightheadedness but no syncope. ?PVCs.  -  currently wearing 7 day zio monitor - EKG today is NSR  3: HTN: - BP 149/97 - starting spironolactone 25mg  daily - BMET next week - continue irbesartan 150mg  daily - BMET 01/12/24 reviewed: sodium 137, potassium 3.8, creatinine 1.08 & GFR >60  4: Hyperlipidemia- - LDL 02/21/24 was 187 - lipo (a) 02/21/24 was 20.9 - begin crestor 20mg  daily  - recheck lipids ~ 06/25    Return in 1 week, sooner if needed.   Charlette Console, FNP 03/01/24

## 2024-03-03 ENCOUNTER — Encounter: Payer: Self-pay | Admitting: Family

## 2024-03-03 ENCOUNTER — Telehealth: Payer: Self-pay

## 2024-03-03 ENCOUNTER — Other Ambulatory Visit (HOSPITAL_COMMUNITY): Payer: Self-pay

## 2024-03-03 ENCOUNTER — Ambulatory Visit: Attending: Family | Admitting: Family

## 2024-03-03 VITALS — BP 125/80 | HR 73 | Wt 204.0 lb

## 2024-03-03 DIAGNOSIS — R5383 Other fatigue: Secondary | ICD-10-CM | POA: Diagnosis not present

## 2024-03-03 DIAGNOSIS — R002 Palpitations: Secondary | ICD-10-CM | POA: Insufficient documentation

## 2024-03-03 DIAGNOSIS — R0602 Shortness of breath: Secondary | ICD-10-CM | POA: Insufficient documentation

## 2024-03-03 DIAGNOSIS — Z79899 Other long term (current) drug therapy: Secondary | ICD-10-CM | POA: Insufficient documentation

## 2024-03-03 DIAGNOSIS — R0609 Other forms of dyspnea: Secondary | ICD-10-CM | POA: Insufficient documentation

## 2024-03-03 DIAGNOSIS — R0683 Snoring: Secondary | ICD-10-CM | POA: Diagnosis not present

## 2024-03-03 DIAGNOSIS — E782 Mixed hyperlipidemia: Secondary | ICD-10-CM | POA: Diagnosis not present

## 2024-03-03 DIAGNOSIS — R079 Chest pain, unspecified: Secondary | ICD-10-CM | POA: Insufficient documentation

## 2024-03-03 DIAGNOSIS — I10 Essential (primary) hypertension: Secondary | ICD-10-CM | POA: Diagnosis not present

## 2024-03-03 DIAGNOSIS — J45909 Unspecified asthma, uncomplicated: Secondary | ICD-10-CM | POA: Insufficient documentation

## 2024-03-03 DIAGNOSIS — R140 Abdominal distension (gaseous): Secondary | ICD-10-CM | POA: Insufficient documentation

## 2024-03-03 DIAGNOSIS — Z87891 Personal history of nicotine dependence: Secondary | ICD-10-CM | POA: Insufficient documentation

## 2024-03-03 DIAGNOSIS — E785 Hyperlipidemia, unspecified: Secondary | ICD-10-CM | POA: Insufficient documentation

## 2024-03-03 DIAGNOSIS — Z8249 Family history of ischemic heart disease and other diseases of the circulatory system: Secondary | ICD-10-CM | POA: Diagnosis not present

## 2024-03-03 DIAGNOSIS — R600 Localized edema: Secondary | ICD-10-CM | POA: Insufficient documentation

## 2024-03-03 MED ORDER — EMPAGLIFLOZIN 10 MG PO TABS: 10.0000 mg | ORAL_TABLET | Freq: Every day | ORAL | 3 refills | Status: DC

## 2024-03-03 NOTE — Telephone Encounter (Signed)
 Advanced Heart Failure Patient Advocate Encounter  Test billing for Jardiance returns $0 copay for 90 day supply. Patient does not need medication assistance at this time.  Kennis Peacock, CPhT Rx Patient Advocate Phone: [phone]

## 2024-03-03 NOTE — Progress Notes (Signed)
 Patient Name:         DOB:       Height:     Weight:  Office Name:         Referring Provider: Shawnee Dellen, NP  Today's Date: 03/03/24  Date:   STOP BANG RISK ASSESSMENT S (snore) Have you been told that you snore?     YES   T (tired) Are you often tired, fatigued, or sleepy during the day?   YES  O (obstruction) Do you stop breathing, choke, or gasp during sleep? NO   P (pressure) Do you have or are you being treated for high blood pressure? YES   B (BMI) Is your body index greater than 35 kg/m? NO   A (age) Are you 60 years old or older? YES   N (neck) Do you have a neck circumference greater than 16 inches?   NO   G (gender) Are you a male? YES   TOTAL STOP/BANG "YES" ANSWERS 5                                                                       For Office Use Only              Procedure Order Form    YES to 3+ Stop Bang questions OR two clinical symptoms - patient qualifies for WatchPAT (CPT 95800)             Clinical Notes: Will consult Sleep Specialist and refer for management of therapy due to patient increased risk of Sleep Apnea. Ordering a sleep study due to the following two clinical symptoms: Excessive daytime sleepiness G47.10 /  Insomnia G47.00    I understand that I am proceeding with a home sleep apnea test as ordered by my treating physician. I understand that untreated sleep apnea is a serious cardiovascular risk factor and it is my responsibility to perform the test and seek management for sleep apnea. I will be contacted with the results and be managed for sleep apnea by a local sleep physician. I will be receiving equipment and further instructions from [facility]. I shall promptly ship back the equipment via the included mailing label. I understand my insurance will be billed for the test and as the patient I am responsible for any insurance related out-of-pocket costs incurred. I have been provided with written instructions and can call for  additional video or telephonic instruction, with 24-hour availability of qualified personnel to answer any questions: Patient Help Desk [phone].  Patient Signature ______________________________________________________   Date______________________ Patient Telemedicine Verbal Consent

## 2024-03-03 NOTE — Patient Instructions (Addendum)
 Medication Changes:  START Jardiance 10mg  (1 tab) every daily  Lab Work:  Go DOWN to LOWER LEVEL (LL) to have your blood work completed inside of [employer] office.   We will only call you if the results are abnormal or if the provider would like to make medication changes.   Special Instructions // Education:  Please keep your echocardiogram appointment on April 28th  Your provider has recommended that you have a home sleep study Engineer, water Test).  We have provided you with the equipment in our office today. Please go ahead and download the app. DO NOT OPEN OR TAMPER WITH THE BOX UNTIL WE ADVISE YOU TO DO SO. Once insurance has approved the test our office will call you with PIN number and approval to proceed with testing. Once you have completed the test you just dispose of the equipment, the information is automatically uploaded to us  via blue-tooth technology. If your test is positive for sleep apnea and you need a home CPAP machine you will be contacted by Dr Charl Concha office [facility]) to set this up.     Follow-Up in: Please keep follow up appointment with the [facility] in May.  At the [facility], you and your health needs are our priority. We have a designated team specialized in the treatment of Heart Failure. This Care Team includes your primary Heart Failure Specialized Cardiologist (physician), Advanced Practice Providers (APPs- Physician Assistants and Nurse Practitioners), and Pharmacist who all work together to provide you with the care you need, when you need it.   You may see any of the following providers on your designated Care Team at your next follow up:  Dr. Jules Oar Dr. Peder Bourdon Dr. Alwin Baars Dr. Judyth Nunnery Shawnee Dellen, FNP Bevely Brush, RPH-CPP  Please be sure to bring in all your medications bottles to every appointment.   Need to Contact Us :  If you have any questions or concerns before your  next appointment please send us  a message through [place] or call our office at [phone].    TO LEAVE A MESSAGE FOR THE NURSE SELECT OPTION 2, PLEASE LEAVE A MESSAGE INCLUDING: YOUR NAME DATE OF BIRTH CALL BACK NUMBER REASON FOR CALL**this is important as we prioritize the call backs  YOU WILL RECEIVE A CALL BACK THE SAME DAY AS LONG AS YOU CALL BEFORE 4:00 PM

## 2024-03-03 NOTE — Progress Notes (Signed)
 ReDS Vest / Clip - 03/03/24 0800       ReDS Vest / Clip   Station Marker D    Ruler Value 34    ReDS Value Range Low volume    ReDS Actual Value 32

## 2024-03-03 NOTE — Progress Notes (Signed)
 ITAMAR home sleep study given to patient, all instructions explained, waiver signed, and CLOUDPAT registration complete.

## 2024-03-03 NOTE — Telephone Encounter (Signed)
 Frank Terry sleep study no precert required. Called the pt and let him know that he can do the test at his convenience. Pt verbalized understanding and agreeable to plan.

## 2024-03-03 NOTE — Progress Notes (Signed)
 [facility] - HEART FAILURE CLINIC - PHARMACIST COUNSELING NOTE  Adherence Assessment  Do you ever forget to take your medication? [] Yes [x] No  Do you ever skip doses due to side effects?  SOB, more frequent and severe chest pains right in the middle, aches in the joints, headache at base of skull/frontal headache  [] Yes [x] No  Do you have trouble affording your medicines?  [] Yes [x] No  Are you ever unable to pick up your medication due to transportation difficulties? [] Yes [x] No  Do you ever stop taking your medications because you don't believe they are helping? [] Yes [x] No  Do you check your weight daily? 199 lbs  [] Yes [x] No  Do you check your blood pressure daily? No way to check this at home  [] Yes [x] No  Adherence strategy: Pill box   Barriers to obtaining medications: None reported    Vital signs: HR 73, BP 125/80, weight 204 lbs.  Renal function: Date 01/12/24, GFR > 60  Current Guideline-Directed Medical Therapy/Evidence Based Medicine  ACE/ARB/ARNI:  Irbesartan 150 mg daily Target dose: 300 mg daily   Beta Blocker:  N/A Target dose: N/A  Aldosterone Antagonist: Spironolactone 25 mg daily Target dose: 25-50 mg daily   SGLT2i:  N/A Target dose: N/A  Diuretic:  N/A  ASSESSMENT 58 year old male who presents to the HF clinic ***. PMH is significant for ***.   Recent ED visit or hospitalization (past 6 months):  Date - ***, CC - *** , Admission Dx (if applicable) - ***   COUNSELING POINTS/CLINICAL PEARLS  Empagliflozin (Goal: 10 mg daily) Inform patients that the most common adverse reactions are genital mycotic infections, nasopharyngitis (dapagliflozin), and urinary tract infections. Inform patients that hypotension may occur and advise them to contact their healthcare provider if they experience such symptoms. Inform patients that dehydration may increase the risk for hypotension, and to have adequate fluid intake.   PLAN Continue  current regimen as directed by NP ECHO scheduled for 03/17/2024  Time spent: 20 minutes  Shailyn Weyandt, PharmD Pharmacy Resident  03/03/2024 11:03 AM

## 2024-03-04 ENCOUNTER — Encounter: Payer: Self-pay | Admitting: Family

## 2024-03-04 ENCOUNTER — Encounter (INDEPENDENT_AMBULATORY_CARE_PROVIDER_SITE_OTHER): Payer: Self-pay | Admitting: Cardiology

## 2024-03-04 DIAGNOSIS — G4733 Obstructive sleep apnea (adult) (pediatric): Secondary | ICD-10-CM

## 2024-03-04 LAB — BASIC METABOLIC PANEL WITH GFR
BUN/Creatinine Ratio: 17 (ref 10–24)
BUN: 18 mg/dL (ref 8–27)
CO2: 20 mmol/L (ref 20–29)
Calcium: 10.3 mg/dL — ABNORMAL HIGH (ref 8.6–10.2)
Chloride: 103 mmol/L (ref 96–106)
Creatinine, Ser: 1.08 mg/dL (ref 0.76–1.27)
Glucose: 94 mg/dL (ref 70–99)
Potassium: 4.8 mmol/L (ref 3.5–5.2)
Sodium: 141 mmol/L (ref 134–144)
eGFR: 79 mL/min/{1.73_m2} (ref >59)

## 2024-03-10 ENCOUNTER — Other Ambulatory Visit: Payer: Self-pay | Admitting: Student

## 2024-03-10 ENCOUNTER — Ambulatory Visit: Attending: Family

## 2024-03-10 DIAGNOSIS — R0683 Snoring: Secondary | ICD-10-CM

## 2024-03-10 DIAGNOSIS — G4452 New daily persistent headache (NDPH): Secondary | ICD-10-CM

## 2024-03-10 MED ORDER — SUMATRIPTAN SUCCINATE 50 MG PO TABS: 50.0000 mg | ORAL_TABLET | ORAL | 0 refills | Status: DC | PRN

## 2024-03-10 NOTE — Procedures (Signed)
   SLEEP STUDY REPORT Patient Information Study Date: 03/04/2024 Patient Name: Frank Terry Patient ID: [id number] Birth Date: 20-Jul-1966 Age: 58 Gender:  BMI: 30.4 (W=205 lb, H=5' 9'') Stopbang: 5 Referring Physician: Peder Bourdon, MD  TEST DESCRIPTION: Home sleep apnea testing was completed using the WatchPat, a Type 1 device, utilizing  peripheral arterial tonometry (PAT), chest movement, actigraphy, pulse oximetry, pulse rate, body position and snore.  AHI was calculated with apnea and hypopnea using valid sleep time as the denominator. RDI includes apneas,  hypopneas, and RERAs. The data acquired and the scoring of sleep and all associated events were performed in  accordance with the recommended standards and specifications as outlined in the AASM Manual for the Scoring of  Sleep and Associated Events 2.2.0 (2015).   FINDINGS:   1. Mild Obstructive Sleep Apnea with AHI 11/hr.   2. No Central Sleep Apnea with pAHIc 1/hr.   3. Oxygen desaturations as low as 85%.   4. Mild to moderate snoring was present. O2 sats were < 88% for 1.3 min.   5. Total sleep time was 6 hrs and 54 min.   6. 27% of total sleep time was spent in REM sleep.   7. Normal sleep onset latency at 18 min.   8. Prolonged REM sleep onset latency at 133 min.   9. Total awakenings were 9.  10. Arrhythmia detection: None  DIAGNOSIS: Mild Obstructive Sleep Apnea (G47.33)  RECOMMENDATIONS: 1. Clinical correlation of these findings is necessary. The decision to treat obstructive sleep apnea (OSA) is usually  based on the presence of apnea symptoms or the presence of associated medical conditions such as Hypertension,  Congestive Heart Failure, Atrial Fibrillation or Obesity. The most common symptoms of OSA are snoring, gasping for  breath while sleeping, daytime sleepiness and fatigue.  2. Initiating apnea therapy is recommended given the presence of symptoms and/or associated conditions.  Recommend proceeding  with one of the following:  a. Auto-CPAP therapy with a pressure range of 5-20cm H2O.  b. An oral appliance (OA) that can be obtained from certain dentists with expertise in sleep medicine. These are  primarily of use in non-obese patients with mild and moderate disease.  c. An ENT consultation which may be useful to look for specific causes of obstruction and possible treatment  options.  d. If patient is intolerant to PAP therapy, consider referral to ENT for evaluation for hypoglossal nerve stimulator.  3. Close follow-up is necessary to ensure success with CPAP or oral appliance therapy for maximum benefit . 4. A follow-up oximetry study on CPAP is recommended to assess the adequacy of therapy and determine the need  for supplemental oxygen or the potential need for Bi-level therapy. An arterial blood gas to determine the adequacy of  baseline ventilation and oxygenation should also be considered. 5. Healthy sleep recommendations include: adequate nightly sleep (normal 7-9 hrs/night), avoidance of caffeine after  noon and alcohol near bedtime, and maintaining a sleep environment that is cool, dark and quiet. 6. Weight loss for overweight patients is recommended. Even modest amounts of weight loss can significantly  improve the severity of sleep apnea. 7. Snoring recommendations include: weight loss where appropriate, side sleeping, and avoidance of alcohol before  bed. 8. Operation of motor vehicle should be avoided when sleepy. Signature: Gaylyn Keas, MD; [facility]; Diplomat, American Board of Sleep  Medicine Electronically Signed: 03/10/2024 10:13:43 AM

## 2024-03-12 ENCOUNTER — Telehealth: Payer: Self-pay | Admitting: *Deleted

## 2024-03-12 DIAGNOSIS — R0683 Snoring: Secondary | ICD-10-CM

## 2024-03-12 DIAGNOSIS — I10 Essential (primary) hypertension: Secondary | ICD-10-CM

## 2024-03-12 DIAGNOSIS — G4733 Obstructive sleep apnea (adult) (pediatric): Secondary | ICD-10-CM

## 2024-03-12 DIAGNOSIS — R002 Palpitations: Secondary | ICD-10-CM

## 2024-03-12 NOTE — Telephone Encounter (Signed)
 The patient has been notified of the result and verbalized understanding.  All questions (if any) were answered. Gaylene Kays, CMA 03/12/2024 10:19 AM     Upon patient request DME selection is [facility] Patient understands he will be contacted by [facility] to set up his cpap. Patient understands to call if [facility] does not contact him with new setup in a timely manner. Patient understands they will be called once confirmation has been received from [facility] that they have received their new machine to schedule 10 week follow up appointment.   [facility] notified of new cpap order  Please add to airview Patient was grateful for the call and thanked me

## 2024-03-12 NOTE — Telephone Encounter (Signed)
-----   Message from Gaylyn Keas sent at 03/10/2024 10:15 AM EDT ----- Please let patient know that they have sleep apnea and recommend treating with CPAP.  Please order an auto CPAP from 4-15cm H2O with heated humidity and mask of choice.  Order overnight pulse ox on CPAP.  Followup with me in 6 weeks.

## 2024-03-17 ENCOUNTER — Ambulatory Visit
Admission: RE | Admit: 2024-03-17 | Discharge: 2024-03-17 | Disposition: A | Discharge status: Home / Self Care | Source: Ambulatory Visit | Attending: Cardiology | Admitting: Cardiology

## 2024-03-17 DIAGNOSIS — I10 Essential (primary) hypertension: Secondary | ICD-10-CM | POA: Insufficient documentation

## 2024-03-17 DIAGNOSIS — R0609 Other forms of dyspnea: Secondary | ICD-10-CM | POA: Diagnosis not present

## 2024-03-17 DIAGNOSIS — R0600 Dyspnea, unspecified: Secondary | ICD-10-CM | POA: Diagnosis present

## 2024-03-17 LAB — ECHOCARDIOGRAM COMPLETE
AR max vel: 3.45 cm2
AV Area VTI: 3.56 cm2
AV Area mean vel: 3.21 cm2
AV Mean grad: 2 mmHg
AV Peak grad: 4.2 mmHg
Ao pk vel: 1.03 m/s
Area-P 1/2: 3.87 cm2
MV VTI: 2.65 cm2
S' Lateral: 2.4 cm

## 2024-03-17 NOTE — Progress Notes (Signed)
*  PRELIMINARY RESULTS* Echocardiogram 2D Echocardiogram has been performed.  Frank Terry 03/17/2024, 9:34 AM

## 2024-03-17 NOTE — Addendum Note (Signed)
 Encounter addended by: Stan Eans, RN on: 03/17/2024 1:01 PM  Actions taken: Imaging Exam ended

## 2024-03-19 ENCOUNTER — Telehealth: Payer: Self-pay | Admitting: Cardiology

## 2024-03-19 NOTE — Telephone Encounter (Signed)
 Called to confirm/remind patient of their appointment at the [facility] on 03/20/24.   Appointment:   [x] Confirmed  [] Left mess   [] No answer/No voice mail  [] VM Full/unable to leave message  [] Phone not in service  Patient reminded to bring all medications and/or complete list.  Confirmed patient has transportation. Gave directions, instructed to utilize valet parking.

## 2024-03-20 ENCOUNTER — Other Ambulatory Visit: Payer: Self-pay

## 2024-03-20 ENCOUNTER — Encounter: Payer: Self-pay | Admitting: Emergency Medicine

## 2024-03-20 ENCOUNTER — Encounter: Payer: Self-pay | Admitting: Cardiology

## 2024-03-20 ENCOUNTER — Emergency Department

## 2024-03-20 ENCOUNTER — Emergency Department
Admission: EM | Admit: 2024-03-20 | Discharge: 2024-03-20 | Disposition: A | Discharge status: Home / Self Care | Attending: Emergency Medicine | Admitting: Emergency Medicine

## 2024-03-20 ENCOUNTER — Ambulatory Visit (HOSPITAL_BASED_OUTPATIENT_CLINIC_OR_DEPARTMENT_OTHER): Admitting: Cardiology

## 2024-03-20 ENCOUNTER — Telehealth: Payer: Self-pay

## 2024-03-20 VITALS — BP 123/81 | HR 70 | Wt 205.0 lb

## 2024-03-20 DIAGNOSIS — Z5986 Financial insecurity: Secondary | ICD-10-CM | POA: Diagnosis not present

## 2024-03-20 DIAGNOSIS — R0602 Shortness of breath: Secondary | ICD-10-CM | POA: Diagnosis not present

## 2024-03-20 DIAGNOSIS — I10 Essential (primary) hypertension: Secondary | ICD-10-CM | POA: Insufficient documentation

## 2024-03-20 DIAGNOSIS — R079 Chest pain, unspecified: Secondary | ICD-10-CM | POA: Diagnosis not present

## 2024-03-20 DIAGNOSIS — Z79899 Other long term (current) drug therapy: Secondary | ICD-10-CM | POA: Insufficient documentation

## 2024-03-20 DIAGNOSIS — R002 Palpitations: Secondary | ICD-10-CM | POA: Diagnosis not present

## 2024-03-20 DIAGNOSIS — Z7984 Long term (current) use of oral hypoglycemic drugs: Secondary | ICD-10-CM | POA: Insufficient documentation

## 2024-03-20 DIAGNOSIS — Z5982 Transportation insecurity: Secondary | ICD-10-CM | POA: Insufficient documentation

## 2024-03-20 DIAGNOSIS — I5032 Chronic diastolic (congestive) heart failure: Secondary | ICD-10-CM | POA: Diagnosis not present

## 2024-03-20 DIAGNOSIS — J45909 Unspecified asthma, uncomplicated: Secondary | ICD-10-CM | POA: Insufficient documentation

## 2024-03-20 DIAGNOSIS — I110 Hypertensive heart disease with heart failure: Secondary | ICD-10-CM | POA: Diagnosis not present

## 2024-03-20 DIAGNOSIS — Z8249 Family history of ischemic heart disease and other diseases of the circulatory system: Secondary | ICD-10-CM | POA: Insufficient documentation

## 2024-03-20 DIAGNOSIS — I471 Supraventricular tachycardia, unspecified: Secondary | ICD-10-CM | POA: Insufficient documentation

## 2024-03-20 DIAGNOSIS — Z87891 Personal history of nicotine dependence: Secondary | ICD-10-CM | POA: Insufficient documentation

## 2024-03-20 DIAGNOSIS — R0600 Dyspnea, unspecified: Secondary | ICD-10-CM

## 2024-03-20 DIAGNOSIS — E785 Hyperlipidemia, unspecified: Secondary | ICD-10-CM | POA: Insufficient documentation

## 2024-03-20 DIAGNOSIS — R0789 Other chest pain: Secondary | ICD-10-CM | POA: Diagnosis present

## 2024-03-20 LAB — BASIC METABOLIC PANEL WITH GFR
Anion gap: 4 — ABNORMAL LOW (ref 5–15)
BUN: 18 mg/dL (ref 6–20)
CO2: 23 mmol/L (ref 22–32)
Calcium: 9.3 mg/dL (ref 8.9–10.3)
Chloride: 109 mmol/L (ref 98–111)
Creatinine, Ser: 1.02 mg/dL (ref 0.61–1.24)
GFR, Estimated: >60 mL/min (ref >60)
Glucose, Bld: 97 mg/dL (ref 70–99)
Potassium: 4.1 mmol/L (ref 3.5–5.1)
Sodium: 136 mmol/L (ref 135–145)

## 2024-03-20 LAB — BRAIN NATRIURETIC PEPTIDE: B Natriuretic Peptide: 11.6 pg/mL (ref 0.0–100.0)

## 2024-03-20 LAB — CBC
HCT: 44.8 % (ref 39.0–52.0)
Hemoglobin: 15.2 g/dL (ref 13.0–17.0)
MCH: 31.6 pg (ref 26.0–34.0)
MCHC: 33.9 g/dL (ref 30.0–36.0)
MCV: 93.1 fL (ref 80.0–100.0)
Platelets: 217 10*3/uL (ref 150–400)
RBC: 4.81 MIL/uL (ref 4.22–5.81)
RDW: 12.5 % (ref 11.5–15.5)
WBC: 5.8 10*3/uL (ref 4.0–10.5)
nRBC: 0 % (ref 0.0–0.2)

## 2024-03-20 LAB — HEPATIC FUNCTION PANEL
ALT: 17 U/L (ref 0–44)
AST: 17 U/L (ref 15–41)
Albumin: 4.7 g/dL (ref 3.5–5.0)
Alkaline Phosphatase: 44 U/L (ref 38–126)
Bilirubin, Direct: <0.1 mg/dL (ref 0.0–0.2)
Total Bilirubin: 1.2 mg/dL (ref 0.0–1.2)
Total Protein: 7.7 g/dL (ref 6.5–8.1)

## 2024-03-20 LAB — TROPONIN I (HIGH SENSITIVITY)
Troponin I (High Sensitivity): 3 ng/L (ref <18)
Troponin I (High Sensitivity): 2 ng/L (ref <18)

## 2024-03-20 LAB — D-DIMER, QUANTITATIVE: D-Dimer, Quant: 1.46 ug{FEU}/mL — ABNORMAL HIGH (ref 0.00–0.50)

## 2024-03-20 LAB — LIPASE, BLOOD: Lipase: 35 U/L (ref 11–51)

## 2024-03-20 MED ORDER — MORPHINE SULFATE (PF) 4 MG/ML IV SOLN
4.0000 mg | Freq: Once | INTRAVENOUS | Status: AC
  Administered 2024-03-20: 4 mg via INTRAVENOUS
  Filled 2024-03-20: qty 1

## 2024-03-20 MED ORDER — ONDANSETRON HCL 4 MG/2ML IJ SOLN
4.0000 mg | Freq: Once | INTRAMUSCULAR | Status: AC
  Administered 2024-03-20: 4 mg via INTRAVENOUS
  Filled 2024-03-20: qty 2

## 2024-03-20 MED ORDER — DOXYCYCLINE HYCLATE 100 MG PO TABS: 100.0000 mg | ORAL_TABLET | Freq: Two times a day (BID) | ORAL | 0 refills | Status: AC

## 2024-03-20 MED ORDER — LIDOCAINE 5 % EX PTCH: 1.0000 | MEDICATED_PATCH | CUTANEOUS | 0 refills | Status: AC

## 2024-03-20 MED ORDER — PREDNISONE 20 MG PO TABS: 40.0000 mg | ORAL_TABLET | Freq: Every day | ORAL | 0 refills | Status: AC

## 2024-03-20 MED ORDER — LIDOCAINE 5 % EX PTCH
1.0000 | MEDICATED_PATCH | CUTANEOUS | Status: DC
  Administered 2024-03-20: 1 via TRANSDERMAL
  Filled 2024-03-20: qty 1

## 2024-03-20 MED ORDER — IOHEXOL 350 MG/ML SOLN
75.0000 mL | Freq: Once | INTRAVENOUS | Status: AC | PRN
  Administered 2024-03-20: 75 mL via INTRAVENOUS

## 2024-03-20 NOTE — ED Triage Notes (Signed)
 Pt to ED via POV. Pt states that he was sent over from the heart failure clinic. Pt reports that he woke up with chest pain and shortness of breath. Pt states that the pain is in the center of his chest and does not radiate. Pt reports that the pain is constant and gets worse when he moves. Pt denies nausea or vomiting. Pt is able to speak in complete sentences and does not have increased work of breathing.

## 2024-03-20 NOTE — Progress Notes (Signed)
 PCP: Jonne Netters, MD Cardiology: Dr. Mitzie Anda   Chief complaint: Chest pain, dyspnea.   58 y.o. with history of HTN and asthma was referred by Dr. Ival Marines for evaluation of palpitations, dyspnea, and chest tightness.  Patient has a long history of wheezing due to asthma.  He was in the ER in 2/25 with dyspnea and chest tightness.  Troponin was negative and he was found to have influenza A.  He does not feels like he has ever completely recovered. He has been very fatigued.  He notes bendopnea (since even before his ER trip) though no orthopnea.  He is short of breath walking 2-3 blocks.  He will also note tightness across his chest when he gets short of breath.  He is not clearly wheezing with these episodes. He is also short of breath with moderate exertion like cleaning up the house or making up his bed.   Patient reports a sensation of heart racing and "beating hard" at times, especially when he lies down at night.  He will often notice it with rest, not with exertion. Palpitations occur daily.  He will feel mildly lightheaded when his heart is racing.  No syncope or presyncope.    Patient's father had an MI at 33, multiple aunts and uncles with MIs and both grandfathers with MIs.  He quit both smoking and ETOH about a year ago. Of note, his PET-CT last year showed coronary calcium .   Patient had an echo in 4/25 which was a normal study showing EF 55-60%, mild LVH, normal diastolic function, normal RV, valves normal, IVC normal. Zio monitor in 4/25 was unremarkable with 1 short SVT run (5 beats).    Patient was seen by our NP with ongoing dyspnea, REDS clip reading elevated and he was started on spironolactone  and Jardiance  due to concern for diastolic CHF.    He presents today for followup of dyspnea/chest pain.  Still waiting on insurance approval for Cardiolite stress test.  He continues to have chest tightness and dyspnea.  He says that the chest tightness has been worse for the last 2 days.   It is central.  It is worse with deep breathing and leaning forwards.  No cough.  He has associated subjective dyspnea.  He does not feel like he is wheezing. He has had constant tightness with pleuritic component since he woke up this morning.   REDS clip ?50%  ECG (personally reviewed): NSR, normal  Labs (2/25): BNP 57, lactate normal, TnI normal, K 3.8, creatinine 1.08 Labs (4/25): LDL 187, Lp(a) 21, K 4.8, creatinine 1.08  PMH: 1. Asthma 2. HTN 3. Prior ETOH abuse: No ETOH since early 2024.  4. Prior smoker: Quit 2024.  5. Migraines 6. Prostate cancer: s/p prostatectomy in 7/24.  7. Solitary right kidney 8. Sleep study: Mild OSA 9. ?Diastolic CHF: Echo (4/25) with EF 55-60%, mild LVH, normal RV, valves normal, IVC normal.  10. Palpitations: Zio monitor (4/25) with 5 beats SVT, otherwise normal.   FH: Father with MI at 41, aunts/uncles with MIs, grandfathers with MIs.   Social History   Socioeconomic History   Marital status: Divorced    Spouse name: Not on file   Number of children: Not on file   Years of education: Not on file   Highest education level: Some college, no degree  Occupational History   Not on file  Tobacco Use   Smoking status: Former    Types: Cigarettes    Start date: 1984  Smokeless tobacco: Former    Types: Chew   Tobacco comments:    Nicotine  patches   Vaping Use   Vaping status: Never Used  Substance and Sexual Activity   Alcohol use: Not Currently    Alcohol/week: 168.0 standard drinks of alcohol    Types: 168 Cans of beer per week    Comment: pt lives in the [facility] for alcohol addiction   Drug use: Not Currently   Sexual activity: Yes    Birth control/protection: None  Other Topics Concern   Not on file  Social History Narrative   Not on file   Social Drivers of Health   Financial Resource Strain: Medium Risk (11/18/2023)   Overall Financial Resource Strain (CARDIA)    Difficulty of Paying Living Expenses: Somewhat hard   Food Insecurity: Food Insecurity Present (11/18/2023)   Hunger Vital Sign    Worried About Running Out of Food in the Last Year: Often true    Ran Out of Food in the Last Year: Often true  Transportation Needs: Unmet Transportation Needs (11/18/2023)   PRAPARE - Administrator, Civil Service (Medical): Yes    Lack of Transportation (Non-Medical): Yes  Physical Activity: Insufficiently Active (11/18/2023)   Exercise Vital Sign    Days of Exercise per Week: 4 days    Minutes of Exercise per Session: 10 min  Stress: No Stress Concern Present (11/18/2023)   [employer] of Occupational Health - Occupational Stress Questionnaire    Feeling of Stress : Not at all  Social Connections: Moderately Isolated (11/18/2023)   Social Connection and Isolation Panel [NHANES]    Frequency of Communication with Friends and Family: Three times a week    Frequency of Social Gatherings with Friends and Family: More than three times a week    Attends Religious Services: Never    Database administrator or Organizations: Yes    Attends Banker Meetings: 1 to 4 times per year    Marital Status: Divorced  Intimate Partner Violence: Not At Risk (06/18/2023)   Humiliation, Afraid, Rape, and Kick questionnaire    Fear of Current or Ex-Partner: No    Emotionally Abused: No    Physically Abused: No    Sexually Abused: No   ROS: All systems reviewed and negative except as per HPI.   Current Outpatient Medications  Medication Sig Dispense Refill   acetaminophen  (TYLENOL ) 500 MG tablet Take 1,000 mg by mouth every 6 (six) hours as needed.     albuterol  (VENTOLIN  HFA) 108 (90 Base) MCG/ACT inhaler Inhale 2 puffs into the lungs every 6 (six) hours as needed for wheezing or shortness of breath. 8 g 2   cetirizine (ZYRTEC) 10 MG tablet Take 10 mg by mouth daily. Unsure of dose     colchicine  0.6 MG tablet Take 0.6 mg by mouth as needed.     empagliflozin  (JARDIANCE ) 10 MG TABS tablet  Take 1 tablet (10 mg total) by mouth daily before breakfast. 90 tablet 3   fluticasone -salmeterol (ADVAIR HFA) 230-21 MCG/ACT inhaler Inhale 2 puffs into the lungs 2 (two) times daily. 1 each 12   ibuprofen  (ADVIL ) 800 MG tablet Take 1 tablet (800 mg total) by mouth every 8 (eight) hours as needed. 30 tablet 0   irbesartan  (AVAPRO ) 150 MG tablet Take 1 tablet (150 mg total) by mouth daily. 90 tablet 0   rosuvastatin  (CRESTOR ) 20 MG tablet Take 1 tablet (20 mg total) by mouth daily. 90 tablet  3   sildenafil  (VIAGRA ) 100 MG tablet Take 1 tablet (100 mg total) by mouth daily as needed for erectile dysfunction. 30 tablet 11   spironolactone  (ALDACTONE ) 25 MG tablet Take 1 tablet (25 mg total) by mouth daily. 90 tablet 3   SUMAtriptan  (IMITREX ) 50 MG tablet Take 1 tablet (50 mg total) by mouth every 2 (two) hours as needed for migraine. May repeat in 2 hours if headache persists or recurs. Do NOT take more than 2 tablets a day. 15 tablet 0   No current facility-administered medications for this visit.   BP 123/81 (BP Location: Right Arm, Patient Position: Sitting, Cuff Size: Normal)   Pulse 70   Wt 205 lb (93 kg)   SpO2 99%   BMI 30.27 kg/m  General: NAD Neck: No JVD, no thyromegaly or thyroid  nodule.  Lungs: Distant breath sounds CV: Nondisplaced PMI.  Heart regular S1/S2, no S3/S4, no murmur.  No peripheral edema.  No carotid bruit.  Normal pedal pulses.  Abdomen: Soft, nontender, no hepatosplenomegaly, no distention.  Skin: Intact without lesions or rashes.  Neurologic: Alert and oriented x 3.  Psych: Normal affect. Extremities: No clubbing or cyanosis.  HEENT: Normal.   Assessment/Plan: 1. Chest pain/dyspnea: This has been present for several months now  He has a family history of CAD, no prior personal cardiac history. Prior smoker but quit about a year ago.  He had some coronary calcium  on his PET-CT in 5/24.  He has a long history of asthma.  Echo in last week was normal, EF 55-60%,  mild LVH, normal RV, valves normal, IVC normal. He presents today with increased chest pain x 2 days with a pleuritic component (constant today).  ECG remains normal, no evidence for MI or pericarditis.  Breath sounds are distant.  REDS clip reading is elevated at 50% though I question accuracy as he is not volume overloaded on exam (?REDS abnormal because of underlying primary lung problem rather than CHF).   - Given the ongoing chest pain, I think he needs to go to the ER for full evaluation with labs and CXR.  The chest pain is not clearly ischemic, has a clear pleuritic component.  Will need troponin checked to rule out acute MI (think unlikely). D dimer to rule out PE (also think less likely).  Will need BNP.  My greatest suspicion at this point is asthma flare.   -  If he is able to go home from the ER, will expedite Cardiolite as outpatient next week.  However, may need to consider RHC/LHC if significant cardiac concern remains after ER workup.  - PCP has ordered PFTs.  - Continue spironolactone  and Jardiance .  2.  Palpitations: Still has occasional palpitations but Zio monitor recently was unremarkable.  3. Hyperlipidemia: Markedly high LDL, Crestor  started at 20 mg daily.  - Lipids/LFTs in 2 months.    He will go to the ER here at [facility] for acute evaluation.  I will arrange expedited Cardiolite as outpatient and followup in 2 wks.   I spent 41 minutes reviewing records, interviewing/examining patient, and managing orders.   Frank Terry 03/20/2024

## 2024-03-20 NOTE — Patient Instructions (Addendum)
 Medication Changes:  No medication changes today!   You have been referred to [facility]. They will be in contact with you in order to schedule an appointment.   Follow-Up in: We will follow up with you after your exercise stress test. We will be in contact in order to schedule your appointment.  At the [facility], you and your health needs are our priority. We have a designated team specialized in the treatment of Heart Failure. This Care Team includes your primary Heart Failure Specialized Cardiologist (physician), Advanced Practice Providers (APPs- Physician Assistants and Nurse Practitioners), and Pharmacist who all work together to provide you with the care you need, when you need it.   You may see any of the following providers on your designated Care Team at your next follow up:  Dr. Jules Oar Dr. Peder Bourdon Dr. Alwin Baars Dr. Judyth Nunnery Shawnee Dellen, FNP Bevely Brush, RPH-CPP  Please be sure to bring in all your medications bottles to every appointment.   Need to Contact Us :  If you have any questions or concerns before your next appointment please send us  a message through [place] or call our office at [phone].    TO LEAVE A MESSAGE FOR THE NURSE SELECT OPTION 2, PLEASE LEAVE A MESSAGE INCLUDING: YOUR NAME DATE OF BIRTH CALL BACK NUMBER REASON FOR CALL**this is important as we prioritize the call backs  YOU WILL RECEIVE A CALL BACK THE SAME DAY AS LONG AS YOU CALL BEFORE 4:00 PM\

## 2024-03-20 NOTE — Telephone Encounter (Signed)
 Called pt to make aware of stress test date, time and location. Also reviewed pre test instructions. Pt verbalized understanding and is agreeable to plan. Also scheduled follow up to be 2 weeks post stress test per dr. Mclean.

## 2024-03-20 NOTE — ED Provider Notes (Signed)
 [facility] Provider Note    Event Date/Time   First MD Initiated Contact with Patient 03/20/24 1043     (approximate)   History   Chest Pain and Shortness of Breath   HPI  Frank Terry is a 58 y.o. male with history of hypertension, asthma who was seen by cardiology today by Dr. Mitzie Anda who comes in with chest pain, shortness of breath.  Patient has had a history of chest pain for some months now but over the past 2 days it seems to be worse with a pleuritic component.  EKG was normal.  Patient sent in for D-dimer, cardiac markers and if negative they can follow-up outpatient and cardiology is working on getting a stress test done outpatient.  Patient reports that this is the same pain that he has had previously although today it seems little bit more pronounced.  Today's been more constant does hurt with taking a deep breath.  Denies really any pain in his right upper abdomen.  He states that even when he tries to lift up his neck he reports pain in his upper chest.  He denies any risk factors for pulmonary embolism other than that he has a history of prostate cancer but after having his prostate removed he is been cancer free.   Physical Exam   Triage Vital Signs: ED Triage Vitals  Encounter Vitals Group     BP 03/20/24 1009 128/86     Systolic BP Percentile --      Diastolic BP Percentile --      Pulse Rate 03/20/24 1009 72     Resp 03/20/24 1009 16     Temp 03/20/24 1009 98 F (36.7 C)     Temp Source 03/20/24 1009 Oral     SpO2 03/20/24 1009 98 %     Weight --      Height 03/20/24 1010 5\' 9"  (1.753 m)     Head Circumference --      Peak Flow --      Pain Score 03/20/24 1010 4     Pain Loc --      Pain Education --      Exclude from Growth Chart --     Most recent vital signs: Vitals:   03/20/24 1009  BP: 128/86  Pulse: 72  Resp: 16  Temp: 98 F (36.7 C)  SpO2: 98%     General: Awake, no distress.  CV:  Good peripheral perfusion.   Resp:  Normal effort.  No wheezing Abd:  No distention.  No right upper quadrant tenderness Other:  No swelling in legs.  No calf tenderness   ED Results / Procedures / Treatments   Labs (all labs ordered are listed, but only abnormal results are displayed) Labs Reviewed  CBC  BASIC METABOLIC PANEL WITH GFR  TROPONIN I (HIGH SENSITIVITY)     EKG  My interpretation of EKG:  Normal sinus rhythm 71 without any ST elevation or T wave versions, normal intervals  RADIOLOGY I have reviewed the xray personally and interpreted no signs of PNA    PROCEDURES:  Critical Care performed: No  .1-3 Lead EKG Interpretation  Performed by: Lubertha Rush, MD Authorized by: Lubertha Rush, MD     Interpretation: normal     ECG rate:  70   ECG rate assessment: normal     Rhythm: sinus rhythm     Ectopy: none     Conduction: normal  MEDICATIONS ORDERED IN ED: Medications  morphine  (PF) 4 MG/ML injection 4 mg (has no administration in time range)  ondansetron  (ZOFRAN ) injection 4 mg (has no administration in time range)  lidocaine  (LIDODERM ) 5 % 1 patch (has no administration in time range)     IMPRESSION / MDM / ASSESSMENT AND PLAN / ED COURSE  I reviewed the triage vital signs and the nursing notes.   Patient's presentation is most consistent with acute presentation with potential threat to life or bodily function.      INITIAL IMPRESSION / ASSESSMENT AND PLAN / ED COURSE   Most Likely DDx:  -Consider ACS vs MSK Vs Tonna Frederic- will get EKG/troponin to evaluate for ACS       DDx that was also considered d/t potential to cause harm, but was found less likely based on history and physical (as detailed above): -PNA (no fevers, cough but CXR to evaluate) -PNX (reassured with equal b/l breath sounds, CXR to evaluate) -Symptomatic anemia (will get H&H) -Pulmonary embolism- ddimer ordered given low wells score.  -Aortic Dissection as no tearing pain and no radiation to the  mid back, pulses equal, no murmur -Pericarditis no EKG changes or hx to suggest dx -Tamponade (no notable SOB, tachycardic, hypotensive) -Esophageal rupture (no h/o diffuse vomitting/no crepitus)   Troponin is negative.  D-dimer positive.  Lipase normal hepatic function normal BMP normal CBC reassuring.  BNP normal does not appear fluid overloaded on examination.  11:41 AM Discussed with patient doing CT imaging he expressed understanding felt comfortable with plan.  He reports pain is much improved    I have considered admission for patient, but given re-assuring workup including EKG, troponin, and re-assuring vitals patient can follow up outpatient with cardiology. I discussed case with Dr Mitzie Anda from cardiology to see if he wanted pt to be admitted for inpt stress test vs outpt stress test. Given re-assurance workup he was okay with outpatient workup.  Will treat patient for possible asthma exacerbation with prednisone , Doxy, lidocaine  patches given he does have some point tenderness with palpation.  Patient expressed understanding felt comfortable with this plan    Discussed with patient that I can not predict future heart attacks and that cardiology can evaluate for need for further workup including stress test.  Explained to patient that if there is a change in symptoms, worsening symptoms, or any other concerns that they should return for repeat evaluation to have repeat EKG/troponin. They expressed understanding.  ____________________________________________   Note:  This document was prepared using Dragon voice recognition software and may include unintentional dictation errors.  The patient is on the cardiac monitor to evaluate for evidence of arrhythmia and/or significant heart rate changes.      FINAL CLINICAL IMPRESSION(S) / ED DIAGNOSES   Final diagnoses:  Chest pain, unspecified type  Asthma, unspecified asthma severity, unspecified whether complicated, unspecified  whether persistent     Rx / DC Orders   ED Discharge Orders          Ordered    doxycycline  (VIBRA -TABS) 100 MG tablet  2 times daily        03/20/24 1509    predniSONE  (DELTASONE ) 20 MG tablet  Daily with breakfast        03/20/24 1509    lidocaine  (LIDODERM ) 5 %  Every 24 hours        03/20/24 1509             Note:  This document was prepared using Dragon voice  recognition software and may include unintentional dictation errors.   Lubertha Rush, MD 03/20/24 [phone]

## 2024-03-20 NOTE — Discharge Instructions (Addendum)
  We are treating you for possible asthma flareup with antibiotics, steroids.  We discussed with your cardiologist is going to follow you up with a stress test outpatient however you develop a change in symptoms or any other concerns you should return to the ER immediately for repeat EKG/troponin as we can't predict future heart attacks.    MPRESSION: 1. No evidence of pulmonary embolus. 2. Curvilinear subpleural consolidation within the medial left lower lobe, favor atelectasis over airspace disease. 3. Aortic Atherosclerosis (ICD10-I70.0). Coronary artery atherosclerosis.

## 2024-03-21 ENCOUNTER — Encounter (HOSPITAL_COMMUNITY): Payer: Self-pay

## 2024-03-24 ENCOUNTER — Other Ambulatory Visit: Payer: Self-pay

## 2024-03-24 DIAGNOSIS — C61 Malignant neoplasm of prostate: Secondary | ICD-10-CM

## 2024-03-25 LAB — PSA: Prostate Specific Ag, Serum: <0.1 ng/mL (ref 0.0–4.0)

## 2024-03-26 ENCOUNTER — Telehealth: Payer: Self-pay | Admitting: Family

## 2024-03-26 ENCOUNTER — Other Ambulatory Visit: Payer: Self-pay | Admitting: Cardiology

## 2024-03-26 DIAGNOSIS — R079 Chest pain, unspecified: Secondary | ICD-10-CM

## 2024-03-27 ENCOUNTER — Ambulatory Visit (HOSPITAL_COMMUNITY): Attending: Cardiology

## 2024-03-27 ENCOUNTER — Ambulatory Visit

## 2024-03-27 DIAGNOSIS — R079 Chest pain, unspecified: Secondary | ICD-10-CM | POA: Diagnosis present

## 2024-03-27 LAB — MYOCARDIAL PERFUSION IMAGING
LV dias vol: 90 mL (ref 62–150)
LV sys vol: 30 mL
Nuc Stress EF: 67 %
Peak HR: 100 {beats}/min
Rest HR: 75 {beats}/min
Rest Nuclear Isotope Dose: 11 mCi
SDS: 0
SRS: 6
SSS: 0
ST Depression (mm): 0 mm
Stress Nuclear Isotope Dose: 31.4 mCi
TID: 1.11

## 2024-03-27 MED ORDER — TECHNETIUM TC 99M TETROFOSMIN IV KIT
31.4000 | PACK | Freq: Once | INTRAVENOUS | Status: AC | PRN
  Administered 2024-03-27: 31.4 via INTRAVENOUS

## 2024-03-27 MED ORDER — REGADENOSON 0.4 MG/5ML IV SOLN
0.4000 mg | Freq: Once | INTRAVENOUS | Status: AC
  Administered 2024-03-27: 0.4 mg via INTRAVENOUS

## 2024-03-27 MED ORDER — REGADENOSON 0.4 MG/5ML IV SOLN
INTRAVENOUS | Status: AC
  Filled 2024-03-27: qty 5

## 2024-03-27 MED ORDER — TECHNETIUM TC 99M TETROFOSMIN IV KIT
11.0000 | PACK | Freq: Once | INTRAVENOUS | Status: AC | PRN
  Administered 2024-03-27: 11 via INTRAVENOUS

## 2024-03-31 ENCOUNTER — Encounter (HOSPITAL_COMMUNITY)

## 2024-04-01 ENCOUNTER — Other Ambulatory Visit: Payer: Medicare HMO | Admitting: Urology

## 2024-04-02 ENCOUNTER — Ambulatory Visit: Attending: Cardiology | Admitting: Cardiology

## 2024-04-02 ENCOUNTER — Encounter: Payer: Self-pay | Admitting: Cardiology

## 2024-04-02 VITALS — BP 116/82 | HR 74 | Wt 211.6 lb

## 2024-04-02 DIAGNOSIS — R002 Palpitations: Secondary | ICD-10-CM | POA: Diagnosis not present

## 2024-04-02 DIAGNOSIS — Z7984 Long term (current) use of oral hypoglycemic drugs: Secondary | ICD-10-CM | POA: Insufficient documentation

## 2024-04-02 DIAGNOSIS — R079 Chest pain, unspecified: Secondary | ICD-10-CM | POA: Diagnosis present

## 2024-04-02 DIAGNOSIS — J45909 Unspecified asthma, uncomplicated: Secondary | ICD-10-CM | POA: Insufficient documentation

## 2024-04-02 DIAGNOSIS — Z79899 Other long term (current) drug therapy: Secondary | ICD-10-CM | POA: Diagnosis not present

## 2024-04-02 DIAGNOSIS — I110 Hypertensive heart disease with heart failure: Secondary | ICD-10-CM | POA: Diagnosis not present

## 2024-04-02 DIAGNOSIS — E785 Hyperlipidemia, unspecified: Secondary | ICD-10-CM | POA: Insufficient documentation

## 2024-04-02 DIAGNOSIS — R0600 Dyspnea, unspecified: Secondary | ICD-10-CM | POA: Insufficient documentation

## 2024-04-02 DIAGNOSIS — I5032 Chronic diastolic (congestive) heart failure: Secondary | ICD-10-CM | POA: Diagnosis not present

## 2024-04-02 NOTE — Patient Instructions (Addendum)
 Medication Changes:  STOP SPIRONOLACTONE    Lab Work:  Go DOWN to LOWER LEVEL (LL) to have your blood work completed inside of [employer] office.  We will only call you if the results are abnormal or if the provider would like to make medication changes.   Follow-Up in: 3 MONTHS WITH DR. Mitzie Anda.  Our Doctors' schedules are NOT open yet for 3 months. We will place you on our recall list. Once they are available, we will call you to schedule your follow up appointment.  At the [facility], you and your health needs are our priority. We have a designated team specialized in the treatment of Heart Failure. This Care Team includes your primary Heart Failure Specialized Cardiologist (physician), Advanced Practice Providers (APPs- Physician Assistants and Nurse Practitioners), and Pharmacist who all work together to provide you with the care you need, when you need it.   You may see any of the following providers on your designated Care Team at your next follow up:  Dr. Jules Oar Dr. Peder Bourdon Dr. Alwin Baars Dr. Judyth Nunnery Shawnee Dellen, FNP Bevely Brush, RPH-CPP  Please be sure to bring in all your medications bottles to every appointment.   Need to Contact Us :  If you have any questions or concerns before your next appointment please send us  a message through [place] or call our office at [phone].    TO LEAVE A MESSAGE FOR THE NURSE SELECT OPTION 2, PLEASE LEAVE A MESSAGE INCLUDING: YOUR NAME DATE OF BIRTH CALL BACK NUMBER REASON FOR CALL**this is important as we prioritize the call backs  YOU WILL RECEIVE A CALL BACK THE SAME DAY AS LONG AS YOU CALL BEFORE 4:00 PM

## 2024-04-02 NOTE — Telephone Encounter (Signed)
 Patient was seen in office today. Discussed at appt.

## 2024-04-03 ENCOUNTER — Ambulatory Visit (HOSPITAL_COMMUNITY): Payer: Self-pay | Admitting: Cardiology

## 2024-04-03 LAB — SEDIMENTATION RATE: Sed Rate: 6 mm/h (ref 0–30)

## 2024-04-03 LAB — C-REACTIVE PROTEIN: CRP: 1 mg/L (ref 0–10)

## 2024-04-04 ENCOUNTER — Ambulatory Visit (INDEPENDENT_AMBULATORY_CARE_PROVIDER_SITE_OTHER): Admitting: Family Medicine

## 2024-04-04 ENCOUNTER — Encounter: Payer: Self-pay | Admitting: Family Medicine

## 2024-04-04 VITALS — BP 129/90 | HR 78 | Ht 69.0 in | Wt 208.6 lb

## 2024-04-04 DIAGNOSIS — M6281 Muscle weakness (generalized): Secondary | ICD-10-CM

## 2024-04-04 DIAGNOSIS — H538 Other visual disturbances: Secondary | ICD-10-CM

## 2024-04-04 DIAGNOSIS — R5383 Other fatigue: Secondary | ICD-10-CM

## 2024-04-04 DIAGNOSIS — H9191 Unspecified hearing loss, right ear: Secondary | ICD-10-CM

## 2024-04-04 NOTE — Assessment & Plan Note (Signed)
 Concerning constellation of symptoms, unclear etiology but most suspicious for neurologic vs autoimmune cause.  Reassuringly cardiac workup otherwise negative.  CT head in 09/2023 reviewed, negative but warrants MRI brain given proximal lower extremity weakness and sensory abnormalities on exam. -MR brain -Autoimmune blood work -Consider referral to neurology pending further workup

## 2024-04-04 NOTE — Patient Instructions (Addendum)
 It was wonderful to see you today! Thank you for choosing [facility].   Please bring ALL of your medications with you to every visit.   Today we talked about:  I ordered an MRI to be done of your brain given your symptoms.  Our office will help with scheduling this and call you with information. Please also get the autoimmune workup next-door.  You will see those results on MyChart and I will follow-up with those results. I also referred you to an audiologist for formal hearing screening, our office will follow-up with that referral information. Please consider going to see an ophthalmologist to further assess your blurry vision and given your other symptoms. Pending additional workup we may need to involve neurology for further consultation input.  Please follow up in 1 month   We are checking some labs today. If they are abnormal, I will call you. If they are normal, I will send you a MyChart message (if it is active) or a letter in the mail. If you do not hear about your labs in the next 2 weeks, please call the office.  Call the clinic at [phone] if your symptoms worsen or you have any concerns.  Please be sure to schedule follow up at the front desk before you leave today.   Jonne Netters, DO Family Medicine

## 2024-04-04 NOTE — Assessment & Plan Note (Signed)
 Possibly related to above, would benefit from ophthalmologic evaluation.  Recommend patient follow-up.

## 2024-04-04 NOTE — Progress Notes (Signed)
    SUBJECTIVE:   CHIEF COMPLAINT / HPI:   Tinnitus, HA, persistent fatigue, blurred vision, weakness Ongoing for months.  Impacting ADLs significantly.  Reports he also has hearing loss in his right ear at times.  Overall difficulty concentrating with brain fog.  Persistent night sweats.  Does have numbness in his upper arms and thighs at times.  Feels weak in his upper legs.  Seen by cardiology, had extensive workup that was largely negative including echo, Zio patch, perfusion CT and OSA evaluation.  H/o prostate cancer but had normal PSA on recent surveillance.  Family history of MS and his sister that also started with odd neurologic presentation.  PERTINENT  PMH / PSH: HTN, asthma, gastric ulcer, diverticulosis, prostate cancer, gout  OBJECTIVE:   BP (!) 129/90   Pulse 78   Ht 5\' 9"  (1.753 m)   Wt 208 lb 9.6 oz (94.6 kg)   SpO2 100%   BMI 30.80 kg/m    General: NAD, pleasant, able to participate in exam HEENT: PERRLA.  EOMI.  TM clear bilaterally.  Nonerythematous pharynx.  No palpable cervical lymphadenopathy.  No palpable axillary lymphadenopathy. Cardiac: RRR, no murmurs. Respiratory: CTAB, normal effort, No wheezes, rales or rhonchi Abdomen: Bowel sounds present, nontender, nondistended Extremities: no edema or cyanosis. Skin: warm and dry, no rashes noted Neuro: Cranial nerves intact.  5/5 grip strength and elbow flexion/extension bilaterally. 4+/5 hip flexion bilaterally.  Numbness over thighs.  5/5 knee flexion/extension bilaterally. Psych: Normal affect and mood  ASSESSMENT/PLAN:   Assessment & Plan Other fatigue Concerning constellation of symptoms, unclear etiology but most suspicious for neurologic vs autoimmune cause.  Reassuringly cardiac workup otherwise negative.  CT head in 09/2023 reviewed, negative but warrants MRI brain given proximal lower extremity weakness and sensory abnormalities on exam. -MR brain -Autoimmune blood work -Consider referral to  neurology pending further workup Hearing loss of right ear, unspecified hearing loss type Will refer for formal audiology evaluation given possible asymmetric hearing loss. -Referral to audiology Blurry vision Possibly related to above, would benefit from ophthalmologic evaluation.  Recommend patient follow-up.   Dr. Jonne Netters, DO [place] [facility]

## 2024-04-06 ENCOUNTER — Ambulatory Visit (HOSPITAL_COMMUNITY): Payer: Self-pay | Admitting: Cardiology

## 2024-04-06 NOTE — Progress Notes (Signed)
 PCP: Jonne Netters, MD Cardiology: Dr. Mitzie Anda   Chief complaint: Chest pain, dyspnea.   58 y.o. with history of HTN and asthma was referred by Dr. Ival Marines for evaluation of palpitations, dyspnea, and chest tightness.  Patient has a long history of wheezing due to asthma.  He was in the ER in 2/25 with dyspnea and chest tightness.  Troponin was negative and he was found to have influenza A.  Patient's father had an MI at 27, multiple aunts and uncles with MIs and both grandfathers with MIs.  He quit both smoking and ETOH about a year ago. Of note, his PET-CT last year showed coronary calcium .   Patient had an echo in 4/25 which was a normal study showing EF 55-60%, mild LVH, normal diastolic function, normal RV, valves normal, IVC normal. Zio monitor in 4/25 was unremarkable with 1 short SVT run (5 beats).    Patient was seen by our NP with ongoing dyspnea, REDS clip reading elevated and he was started on spironolactone  and Jardiance  due to concern for diastolic CHF.    Today presents after his recent normal stress test.  We discussed that his results are reassuringly normal and he does not have evidence of coronary artery calcifications or CAD.  He continues to have ongoing symptoms of shortness of breath with exertion, significant fatigue, and has been feeling worse and worse over the past few months.  He has yet to be seen by pulmonary.   Labs (2/25): BNP 57, lactate normal, TnI normal, K 3.8, creatinine 1.08 Labs (4/25): LDL 187, Lp(a) 21, K 4.8, creatinine 1.08  PMH: 1. Asthma 2. HTN 3. Prior ETOH abuse: No ETOH since early 2024.  4. Prior smoker: Quit 2024.  5. Migraines 6. Prostate cancer: s/p prostatectomy in 7/24.  7. Solitary right kidney 8. Sleep study: Mild OSA 9. ?Diastolic CHF: Echo (4/25) with EF 55-60%, mild LVH, normal RV, valves normal, IVC normal.  10. Palpitations: Zio monitor (4/25) with 5 beats SVT, otherwise normal.   FH: Father with MI at 57, aunts/uncles  with MIs, grandfathers with MIs.  BP 116/82   Pulse 74   Wt 211 lb 9.6 oz (96 kg)   SpO2 97%   BMI 31.25 kg/m  General: NAD Lungs: Normal work of breathing CV:  Heart regular S1/S2, no S3/S4, no murmur.  No peripheral edema.  No carotid bruit.  Normal pedal pulses.  Abdomen: Soft, nontender, no hepatosplenomegaly, no distention.  Skin: Intact without lesions or rashes.  Neurologic: Alert and oriented x 3.  Psych: Normal affect. Extremities: No clubbing or cyanosis.   Assessment/Plan: 1. Chest pain/dyspnea: Description is noncardiac, workup has been reassuringly normal including recent nuclear stress test, also noted the absence of coronary artery calcifications.  Given this as well as fairly normal echocardiogram I do not believe his symptoms have a cardiac origin.   - Discussed reassuringly normal cardiac workup, will obtain inflammatory markers given somewhat positional nature of his symptoms but EKG not consistent with pericarditis -If negative would follow-up with PCP and pulmonary to evaluate noncardiac etiologies - PCP has ordered PFTs.  - Given minimal improvement on therapy for HFpEF, will stop spironolactone  at this time, continue Jardiance  10 mg daily -Continue irbesartan  150 mg daily 2.  Palpitations: Still has occasional palpitations but Zio monitor recently was unremarkable 3. Hyperlipidemia: Markedly high LDL, Crestor  started at 20 mg daily.  - Lipids/LFTs in 2 months.    As he is not my patient will arrange follow-up with Dr.  Mitzie Anda, but could likely be discharged from advanced heart failure clinic  Lauralee Poll 04/06/2024

## 2024-04-09 ENCOUNTER — Encounter: Admitting: Family

## 2024-04-12 LAB — ANTI-DNA ANTIBODY, DOUBLE-STRANDED: dsDNA Ab: <1 [IU]/mL (ref 0–9)

## 2024-04-18 ENCOUNTER — Ambulatory Visit
Admission: RE | Admit: 2024-04-18 | Discharge: 2024-04-18 | Disposition: A | Discharge status: Home / Self Care | Source: Ambulatory Visit | Attending: Family Medicine | Admitting: Family Medicine

## 2024-04-18 DIAGNOSIS — M6281 Muscle weakness (generalized): Secondary | ICD-10-CM

## 2024-04-18 DIAGNOSIS — R5383 Other fatigue: Secondary | ICD-10-CM

## 2024-04-18 MED ORDER — GADOPICLENOL 0.5 MMOL/ML IV SOLN
10.0000 mL | Freq: Once | INTRAVENOUS | Status: AC | PRN
  Administered 2024-04-18: 10 mL via INTRAVENOUS

## 2024-04-24 ENCOUNTER — Telehealth: Payer: Self-pay | Admitting: Audiologist

## 2024-04-28 ENCOUNTER — Encounter: Payer: Self-pay | Admitting: Student in an Organized Health Care Education/Training Program

## 2024-04-28 ENCOUNTER — Ambulatory Visit: Admitting: Student in an Organized Health Care Education/Training Program

## 2024-04-28 ENCOUNTER — Other Ambulatory Visit: Payer: Self-pay | Admitting: Family Medicine

## 2024-04-28 VITALS — BP 112/74 | HR 90 | Temp 98.4°F | Ht 69.0 in | Wt 216.6 lb

## 2024-04-28 DIAGNOSIS — J4520 Mild intermittent asthma, uncomplicated: Secondary | ICD-10-CM | POA: Diagnosis not present

## 2024-04-28 DIAGNOSIS — G4452 New daily persistent headache (NDPH): Secondary | ICD-10-CM

## 2024-04-28 DIAGNOSIS — R0602 Shortness of breath: Secondary | ICD-10-CM | POA: Diagnosis not present

## 2024-04-28 DIAGNOSIS — Z87891 Personal history of nicotine dependence: Secondary | ICD-10-CM | POA: Diagnosis not present

## 2024-04-28 NOTE — Progress Notes (Unsigned)
 Synopsis: Referred in for shortness of breath by Darlis Eisenmenger, MD  Assessment & Plan:   1. Mild intermittent chronic asthma without complication (Primary) 2. Shortness of breath  Presents for the evaluation of shortness of breath that is mostly exertional with some associated chest tightness over the past year.  This is in the setting of a history of smoking, history of asthma, and weight gain.  My differential for his symptoms include obstructive lung disease (such as COPD, asthma, or asthma/COPD overlap), and restrictive lung disease (neuromuscular weakness, obesity). I have personally reviewed his chest CT from May of 2025 which does not show any signs of interstitial lung disease. There is an area of subpleural curvilineal consolidation in the left lower lobe that likely represents atelectasis. I will follow up on that at future date with repeat CT, likely at the 6 month mark.  For workup, I will obtain a pulmonary function test (spirometry, lung volumes, DLCO, MIP/MEP) to better evaluate the etiology behind symptoms.  I will also obtain sniff test to rule out diaphragmatic weakness.  - Pulmonary Function Test; Future - DG Sniff Test; Future  3. History of tobacco use  He does report of 45-pack-year smoking history, now quit, which does qualify him for lung cancer screening.  I did recommend to the patient that he be enrolled in our lung cancer screening program and he is agreeable.  - Ambulatory Referral for Lung Cancer Screening   Return in about 2 months (around 06/28/2024).  I spent 60 minutes caring for this patient today, including preparing to see the patient, obtaining a medical history , reviewing a separately obtained history, performing a medically appropriate examination and/or evaluation, counseling and educating the patient/family/caregiver, ordering medications, tests, or procedures, documenting clinical information in the electronic health record, and independently  interpreting results (not separately reported/billed) and communicating results to the patient/family/caregiver  Vergia Glasgow, MD [facility]  End of visit medications:  No orders of the defined types were placed in this encounter.    Current Outpatient Medications:    acetaminophen  (TYLENOL ) 500 MG tablet, Take 1,000 mg by mouth every 6 (six) hours as needed., Disp: , Rfl:    albuterol  (VENTOLIN  HFA) 108 (90 Base) MCG/ACT inhaler, Inhale 2 puffs into the lungs every 6 (six) hours as needed for wheezing or shortness of breath., Disp: 8 g, Rfl: 2   cetirizine (ZYRTEC) 10 MG tablet, Take 10 mg by mouth daily. Unsure of dose, Disp: , Rfl:    colchicine  0.6 MG tablet, Take 0.6 mg by mouth as needed., Disp: , Rfl:    fluticasone -salmeterol (ADVAIR HFA) 230-21 MCG/ACT inhaler, Inhale 2 puffs into the lungs 2 (two) times daily., Disp: 1 each, Rfl: 12   ibuprofen  (ADVIL ) 800 MG tablet, Take 1 tablet (800 mg total) by mouth every 8 (eight) hours as needed., Disp: 30 tablet, Rfl: 0   irbesartan  (AVAPRO ) 150 MG tablet, Take 1 tablet (150 mg total) by mouth daily., Disp: 90 tablet, Rfl: 0   sildenafil  (VIAGRA ) 100 MG tablet, Take 1 tablet (100 mg total) by mouth daily as needed for erectile dysfunction., Disp: 30 tablet, Rfl: 11   SUMAtriptan  (IMITREX ) 50 MG tablet, Take 1 tablet (50 mg total) by mouth every 2 (two) hours as needed for migraine. May repeat in 2 hours if headache persists or recurs. Do NOT take more than 2 tablets a day., Disp: 15 tablet, Rfl: 0   empagliflozin  (JARDIANCE ) 10 MG TABS tablet, Take 1 tablet (  10 mg total) by mouth daily before breakfast., Disp: 90 tablet, Rfl: 3   rosuvastatin  (CRESTOR ) 20 MG tablet, Take 1 tablet (20 mg total) by mouth daily., Disp: 90 tablet, Rfl: 3   Subjective:   PATIENT ID: Frank Terry GENDER: male DOB: 05/09/1966, MRN: [id number]  Chief Complaint  Patient presents with   Consult    Shortness of breath on exertion and at  rest.     HPI  Patient is a pleasant 58 year old male with a past medical history of prostate cancer s/p resection and asthma who presents to clinic for the evaluation of shortness of breath and chest tightness.  Patient is presenting for symptoms of exertional dyspnea associated with chest tightness (bandlike sensation) for the past year.  He started noticing the symptoms some time around his prostatectomy.  He would previously walk a couple of miles without any issues. This worsened over the past year and he is now only able to walk 2 blocks before having to stop and catch his breath.  Today, he was dropped off at the entrance but walking from the main lobby to the clinic caused him significant dyspnea.  This is associated with a bandlike sensation around his chest.  The shortness of breath does not wake him up from sleep, but he does feel somewhat more short of breath when he lays flat. He sleeps with his head propped up but that is more for comfort rather than for anything else.  He denies any cough, sputum production, wheeze, fevers, chills, or night sweats.  He does report a significant weight gain over the past year. Other symptoms reported by the patient include joint pains, numbness, and overall fatigue.  Patient has been seen and evaluated by cardiology where workup was essentially unremarkable.  He is also been seen in sleep clinic and a sleep study showed mild OSA.  He was prescribed a CPAP machine but he did not feel that he would use it so has not started it.  He is also being followed closely by his primary care physician and an autoimmune workup has been initiated.   Patient was seen in the ER in February 2025 with shortness of breath and chest tightness where he was diagnosed with influenza.  He was given nebulizers, inhalers, and a course of prednisone  and discharged.  He was prescribed Advair 230/21 HFA which he used for around a month without any improvement. Patient was also seen in  the ED in May 2025 with chest tightness where a CT scan of the chest with PE protocol was performed and was negative for PE.  He reports a history of smoking, having started around the age of 61.  He tells me that he did try to quit and was successful for a few years.  He is now a former smoker.  He smoked a pack a day for around 30 years, 2 packs a day for around 4 years, and 3 packs a day for around 2 or 3 years.  Collectively, he probably has around 45 pack years of smoking history.  He also reports smoking marijuana, at times on a regular basis.  He also smoked crack cocaine for around 5 years.  Patient also is a recovering alcoholic, and has abstained from drinking alcohol for the past year.  He is originally from [place]  but lived most of his life.  [place] .  Ancillary information including prior medications, full medical/surgical/family/social histories, and PFTs (when available) are listed below and have been reviewed.  Review of Systems  Constitutional:  Positive for malaise/fatigue. Negative for chills, fever and weight loss.  Respiratory:  Positive for shortness of breath. Negative for cough, hemoptysis, sputum production and wheezing.   Cardiovascular:  Negative for chest pain, palpitations and orthopnea.     Objective:   Vitals:   04/28/24 0818  BP: 112/74  Pulse: 90  Temp: 98.4 F (36.9 C)  TempSrc: Temporal  SpO2: 96%  Weight: 216 lb 9.6 oz (98.2 kg)  Height: 5\' 9"  (1.753 m)   96% on RA  BMI Readings from Last 3 Encounters:  04/28/24 31.99 kg/m  04/04/24 30.80 kg/m  04/02/24 31.25 kg/m   Wt Readings from Last 3 Encounters:  04/28/24 216 lb 9.6 oz (98.2 kg)  04/04/24 208 lb 9.6 oz (94.6 kg)  04/02/24 211 lb 9.6 oz (96 kg)    Physical Exam Constitutional:      Appearance: He is obese. He is not ill-appearing.  Cardiovascular:     Rate and Rhythm: Normal rate and regular rhythm.     Pulses: Normal pulses.     Heart sounds: Normal heart sounds.   Pulmonary:     Effort: Pulmonary effort is normal. No respiratory distress.     Breath sounds: Normal breath sounds. No wheezing, rhonchi or rales.  Neurological:     General: No focal deficit present.     Mental Status: He is alert and oriented to person, place, and time. Mental status is at baseline.       Ancillary Information    Past Medical History:  Diagnosis Date   Alcohol use disorder    pt currently lives at the [facility]   Allergy    Anxiety    Arthritis    Asthma    well controlled   Compression fracture of spine (HCC)    Congenital absence of one kidney    pt has right kidney   Depression    Gastric ulcer    GERD (gastroesophageal reflux disease)    no meds   Gout    HTN (hypertension)    Hyponatremia    Osteoporosis    Prostate cancer (HCC)    Ulcer    Vasitis 05/2023     Family History  Problem Relation Age of Onset   Hypertension Mother    Lung cancer Mother    Osteoporosis Mother    Alcohol abuse Mother    Arthritis Mother    Cancer Mother    Heart disease Mother    Hypertension Father    Cancer Father        Abdominal   Heart attack Father    Hypertension Sister    Multiple sclerosis Sister      Past Surgical History:  Procedure Laterality Date   COLONOSCOPY WITH ESOPHAGOGASTRODUODENOSCOPY (EGD)     MASS EXCISION Left 09/19/2023   Procedure: EXCISION MASS, deep soft tissue mass back;  Surgeon: Flynn Hylan, MD;  Location: [facility] ORS;  Service: General;  Laterality: Left;   PELVIC LYMPH NODE DISSECTION Bilateral 06/18/2023   Procedure: PELVIC LYMPH NODE DISSECTION;  Surgeon: Dustin Gimenez, MD;  Location: [facility] ORS;  Service: Urology;  Laterality: Bilateral;   PROSTATE BIOPSY     ROBOT ASSISTED LAPAROSCOPIC RADICAL PROSTATECTOMY N/A 06/18/2023   Procedure: XI ROBOTIC ASSISTED LAPAROSCOPIC RADICAL PROSTATECTOMY;  Surgeon: Dustin Gimenez, MD;  Location: [facility] ORS;  Service: Urology;  Laterality: N/A;   TONSILLECTOMY     age 53     Social History   Socioeconomic History  Marital status: Divorced    Spouse name: Not on file   Number of children: Not on file   Years of education: Not on file   Highest education level: Some college, no degree  Occupational History   Not on file  Tobacco Use   Smoking status: Former    Types: Cigarettes    Start date: 1984   Smokeless tobacco: Former    Types: Chew   Tobacco comments:    Nicotine  patches   Vaping Use   Vaping status: Never Used  Substance and Sexual Activity   Alcohol use: Not Currently    Alcohol/week: 168.0 standard drinks of alcohol    Types: 168 Cans of beer per week    Comment: pt lives in the [facility] for alcohol addiction   Drug use: Not Currently   Sexual activity: Yes    Birth control/protection: None  Other Topics Concern   Not on file  Social History Narrative   Not on file   Social Drivers of Health   Financial Resource Strain: Medium Risk (11/18/2023)   Overall Financial Resource Strain (CARDIA)    Difficulty of Paying Living Expenses: Somewhat hard  Food Insecurity: Food Insecurity Present (11/18/2023)   Hunger Vital Sign    Worried About Running Out of Food in the Last Year: Often true    Ran Out of Food in the Last Year: Often true  Transportation Needs: Unmet Transportation Needs (11/18/2023)   PRAPARE - Administrator, Civil Service (Medical): Yes    Lack of Transportation (Non-Medical): Yes  Physical Activity: Insufficiently Active (11/18/2023)   Exercise Vital Sign    Days of Exercise per Week: 4 days    Minutes of Exercise per Session: 10 min  Stress: No Stress Concern Present (11/18/2023)   [employer] of Occupational Health - Occupational Stress Questionnaire    Feeling of Stress : Not at all  Social Connections: Moderately Isolated (11/18/2023)   Social Connection and Isolation Panel [NHANES]    Frequency of Communication with Friends and Family: Three times a week    Frequency of Social  Gatherings with Friends and Family: More than three times a week    Attends Religious Services: Never    Active Member of Clubs or Organizations: Yes    Attends Banker Meetings: 1 to 4 times per year    Marital Status: Divorced  Intimate Partner Violence: Not At Risk (06/18/2023)   Humiliation, Afraid, Rape, and Kick questionnaire    Fear of Current or Ex-Partner: No    Emotionally Abused: No    Physically Abused: No    Sexually Abused: No     Allergies  Allergen Reactions   Penicillins Rash     CBC    Component Value Date/Time   WBC 5.8 03/20/2024 1012   RBC 4.81 03/20/2024 1012   HGB 15.2 03/20/2024 1012   HGB 14.9 09/25/2023 1606   HCT 44.8 03/20/2024 1012   HCT 42.8 09/25/2023 1606   PLT 217 03/20/2024 1012   PLT 252 09/25/2023 1606   MCV 93.1 03/20/2024 1012   MCV 96 09/25/2023 1606   MCV 95 10/31/2013 1229   MCH 31.6 03/20/2024 1012   MCHC 33.9 03/20/2024 1012   RDW 12.5 03/20/2024 1012   RDW 11.9 09/25/2023 1606   RDW 13.2 10/31/2013 1229   LYMPHSABS 1.4 09/25/2023 1606   MONOABS 0.6 05/18/2022 1058   EOSABS 0.3 09/25/2023 1606   BASOSABS 0.1 09/25/2023 1606  Pulmonary Functions Testing Results:     No data to display          Outpatient Medications Prior to Visit  Medication Sig Dispense Refill   acetaminophen  (TYLENOL ) 500 MG tablet Take 1,000 mg by mouth every 6 (six) hours as needed.     albuterol  (VENTOLIN  HFA) 108 (90 Base) MCG/ACT inhaler Inhale 2 puffs into the lungs every 6 (six) hours as needed for wheezing or shortness of breath. 8 g 2   cetirizine (ZYRTEC) 10 MG tablet Take 10 mg by mouth daily. Unsure of dose     colchicine  0.6 MG tablet Take 0.6 mg by mouth as needed.     fluticasone -salmeterol (ADVAIR HFA) 230-21 MCG/ACT inhaler Inhale 2 puffs into the lungs 2 (two) times daily. 1 each 12   ibuprofen  (ADVIL ) 800 MG tablet Take 1 tablet (800 mg total) by mouth every 8 (eight) hours as needed. 30 tablet 0   irbesartan   (AVAPRO ) 150 MG tablet Take 1 tablet (150 mg total) by mouth daily. 90 tablet 0   sildenafil  (VIAGRA ) 100 MG tablet Take 1 tablet (100 mg total) by mouth daily as needed for erectile dysfunction. 30 tablet 11   SUMAtriptan  (IMITREX ) 50 MG tablet Take 1 tablet (50 mg total) by mouth every 2 (two) hours as needed for migraine. May repeat in 2 hours if headache persists or recurs. Do NOT take more than 2 tablets a day. 15 tablet 0   empagliflozin  (JARDIANCE ) 10 MG TABS tablet Take 1 tablet (10 mg total) by mouth daily before breakfast. 90 tablet 3   rosuvastatin  (CRESTOR ) 20 MG tablet Take 1 tablet (20 mg total) by mouth daily. 90 tablet 3   No facility-administered medications prior to visit.

## 2024-04-30 ENCOUNTER — Ambulatory Visit: Attending: Family Medicine | Admitting: Audiologist

## 2024-04-30 DIAGNOSIS — H903 Sensorineural hearing loss, bilateral: Secondary | ICD-10-CM | POA: Diagnosis present

## 2024-04-30 NOTE — Procedures (Signed)
  [facility] [address] [place], [place] [address] [phone]  AUDIOLOGICAL EVALUATION  Name: Frank Terry    Status: Outpatient DOB: [birth year]    Referent: Jonne Netters, MD MRN: [id number] Date: 04/30/2024     Diagnosis: Sensorineural hearing loss, bilateral   HISTORY: Frank Terry, 58 y.o., was seen for an audiological evaluation.   Frank Terry notices increased tinnitus. Over the last few months his tinnitus has become near constant. It is greater in the left ear. Occasionally the right ear will stop ringing and his hearing in the right ear will go away for a few seconds. The tinnitus is a whine sound that changes in tone.  Frank Terry notes no ear pain, pressure, or fullness.  He is also experiencing dizziness and vertigo that comes and goes. Sometimes it will be when he wakes up. The length of dizziness and vertigo varies from a few seconds to 20 minutes.  EVALUATION: Otoscopic inspection reveals clear ear canals with visible tympanic membranes.   Tympanometry was completed to assess middle ear status. Normal, Type A tympanograms were obtained bilaterally.  Standard audiometric techniques were used to obtain thresholds under high frequency headphones. Speech reception thresholds are 10 dBHL on the right and 10 dBHL on the left using recorded spondee word lists. Word recognition was 100% at 50 dBHL on the right and 92% at 50 dBHL on the left using recorded NU-6 word lists, in quiet. A mild high frequency sensorineural hearing loss is present bilaterally, at 6000Hz  - 8000Hz  for the right ear and at 3000Hz  - 8000Hz  for the left ear.   CONCLUSION:  Frank Terry has a mild high frequency sensorineural hearing loss bilaterally.  Word recognition is good in quiet at conversational speech levels bilaterally. Findings were reviewed with the patient.   RECOMMENDATIONS: Medical evaluation by an Ear, Nose and Throat physician.   Use of noise maskers or fans to distract from  tinnitus at night.  Audiogram scanned in under media tab.  Burgess Caroline, Au.D., CCC-A Audiologist 04/30/2024  cc: Jonne Netters, MD

## 2024-05-12 ENCOUNTER — Telehealth: Payer: Self-pay

## 2024-05-12 NOTE — Telephone Encounter (Signed)
 Patient calls nurse line requesting MRI results.   He reports he feels his symptoms are worsening. He reports continued tinnitus, he reports he feels its getting louder.  He reports he had a fall this weekend. He reports my legs just gave out on me. He denies hitting his head. No LOC.   Patient scheduled for a FU with PCP for 7/1. He does not want to see anyone else.   Advised will forward to PCP for MRI results.   Precautions discussed with patient.

## 2024-05-14 ENCOUNTER — Ambulatory Visit

## 2024-05-16 ENCOUNTER — Ambulatory Visit: Payer: Self-pay | Admitting: Urology

## 2024-05-19 ENCOUNTER — Telehealth: Payer: Self-pay | Admitting: Urology

## 2024-05-19 NOTE — Telephone Encounter (Signed)
 Called patient and lvm for him to call office to reschedule cysto that was cancelled in May. It was scheduled with Dr. Penne, but can be rescheduled with another provider.

## 2024-05-19 NOTE — Progress Notes (Unsigned)
    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  Seen by audiology on 04/30/2024 and noted to have mild high-frequency sensorineural hearing loss bilaterally.  Reports his symptoms have persisted and continued to impact his life every day.  Reports he has weakness extending down his left side.  Reports he has also fallen twice due to feeling like his legs are too weak.  Also notes he has random nerve twinges that can occur throughout his body without inciting trigger.  Ports he does have a constant headache associated with dizziness and light sensitivity.  Has taken sumatriptan  but provides mild relief but he is having daily symptoms.  Also reports generalized fatigue and difficulty completing tasks or leaving the home.  States he is getting some sleep but it has not been restful.  PERTINENT  PMH / PSH: HTN, asthma, spondylosis lumbar spine, prostate cancer  OBJECTIVE:   BP (!) 138/93   Pulse 69   Ht 5' 9 (1.753 m)   Wt 213 lb (96.6 kg)   SpO2 100%   BMI 31.45 kg/m    General: NAD, pleasant, able to participate in exam HEENT: TM clear bilaterally.  Nonerythematous pharynx.  No cervical lymphadenopathy. Cardiac: RRR, no murmurs. Respiratory: CTAB, normal effort, No wheezes, rales or rhonchi Abdomen: Bowel sounds present, nontender, nondistended Extremities: no edema or cyanosis. Skin: warm and dry, no rashes noted Neuro: CN II: PERRL CN III, IV,VI: EOMI CV V: Normal sensation in V1, V2, V3 CVII: Symmetric smile and brow raise CN VIII: Normal hearing CN IX,X: Symmetric palate raise  CN XI: 5/5 shoulder shrug CN XII: Symmetric tongue protrusion  5/5 strength in shoulder flexion and grip strength bilaterally 5 -/5 strength in hip flexion bilaterally Diminished sensation in left forearm and hand compared to the right side Numbness of thighs  bilaterally Psych: Normal affect and mood  ASSESSMENT/PLAN:   Assessment & Plan Chronic intractable headache, unspecified headache type Unclear etiology for constellation of symptoms, constant headache possibly intractable migraine and given sensation abnormalities possibly underlying complex migraine picture.  Reassuringly MRI brain without acute etiology but given persistence of symptoms will refer to neurology for additional workup and consideration of EMG given objective weakness in lower extremities.  Taking sumatriptan  without resolution of migraine and given daily symptoms will transition to maintenance migraine medication and assess for benefit. -Start Nurtec every other day with as needed option if migraine on off days -Referral to neurology -Lab work to rule out secondary causes including ANA, B12, folate Primary hypertension 138/93 today has been out of his irbesartan . -Refilled irbesartan  150 mg daily continue home BP monitoring Other fatigue Likely related to neurologic etiology as above, extensively discussed sleep hygiene and need to establish regular sleep patterns.  Will assess for secondary etiology as above.     Dr. Izetta Nap, DO [facility]

## 2024-05-20 ENCOUNTER — Encounter: Payer: Self-pay | Admitting: Neurology

## 2024-05-20 ENCOUNTER — Ambulatory Visit: Admitting: Family Medicine

## 2024-05-20 ENCOUNTER — Telehealth: Payer: Self-pay

## 2024-05-20 VITALS — BP 138/93 | HR 69 | Ht 69.0 in | Wt 213.0 lb

## 2024-05-20 DIAGNOSIS — G8929 Other chronic pain: Secondary | ICD-10-CM | POA: Diagnosis not present

## 2024-05-20 DIAGNOSIS — R5383 Other fatigue: Secondary | ICD-10-CM | POA: Diagnosis not present

## 2024-05-20 DIAGNOSIS — R519 Headache, unspecified: Secondary | ICD-10-CM

## 2024-05-20 DIAGNOSIS — I10 Essential (primary) hypertension: Secondary | ICD-10-CM | POA: Diagnosis not present

## 2024-05-20 DIAGNOSIS — G43719 Chronic migraine without aura, intractable, without status migrainosus: Secondary | ICD-10-CM

## 2024-05-20 MED ORDER — NURTEC 75 MG PO TBDP: ORAL_TABLET | ORAL | 0 refills | Status: DC

## 2024-05-20 MED ORDER — IRBESARTAN 150 MG PO TABS: 150.0000 mg | ORAL_TABLET | Freq: Every day | ORAL | 1 refills | Status: DC

## 2024-05-20 NOTE — Telephone Encounter (Signed)
 Pharmacy Patient Advocate Encounter   Received notification from CoverMyMeds that prior authorization for Nurtec 75MG  dispersible tablets is required/requested.   Insurance verification completed.   The patient is insured through [employer] .   PA required; PA submitted to above mentioned insurance via CoverMyMeds Key/confirmation #/[id number] [employer]. Status is pending

## 2024-05-20 NOTE — Assessment & Plan Note (Signed)
 138/93 today has been out of his irbesartan . -Refilled irbesartan  150 mg daily continue home BP monitoring

## 2024-05-20 NOTE — Assessment & Plan Note (Signed)
 Likely related to neurologic etiology as above, extensively discussed sleep hygiene and need to establish regular sleep patterns.  Will assess for secondary etiology as above.

## 2024-05-20 NOTE — Patient Instructions (Addendum)
 It was wonderful to see you today! Thank you for choosing [facility].   Please bring ALL of your medications with you to every visit.   Today we talked about:  We are getting additional blood work today to check for additional reasons for your weakness and fatigue.  Reassuringly your MRI looked mostly normal but given your persistent symptoms I do think you need to see neurology.   I am hopeful migraine medication may help with your symptoms further.  Since the sumatriptan  is not effective we are switching you to Nurtec which you will take every other day for maintenance therapy.  You can take a dose on days you are not taking it if you have a migraine.  This medication may need insurance approval so keep in mind it may take a little time to get it from the pharmacy. I refilled your blood pressure medication today, please take it as prescribed and check your blood pressure at home with a goal of less than 130/80.  Please follow up in 1 month if not improved or seen by Neurology.  Please come back and see me so we can discuss other potential options for treatment.   We are checking some labs today. If they are abnormal, I will call you. If they are normal, I will send you a MyChart message (if it is active) or a letter in the mail. If you do not hear about your labs in the next 2 weeks, please call the office.  Call the clinic at [phone] if your symptoms worsen or you have any concerns.  Please be sure to schedule follow up at the front desk before you leave today.   Izetta Nap, DO Family Medicine

## 2024-05-22 ENCOUNTER — Ambulatory Visit: Payer: Self-pay | Admitting: Family Medicine

## 2024-05-22 LAB — VITAMIN B12: Vitamin B-12: 508 pg/mL (ref 232–1245)

## 2024-05-22 LAB — FOLATE: Folate: 5.4 ng/mL (ref >3.0)

## 2024-05-22 LAB — ANA: Anti Nuclear Antibody (ANA): NEGATIVE

## 2024-05-22 LAB — RHEUMATOID FACTOR: Rheumatoid fact SerPl-aCnc: <10 [IU]/mL (ref <14.0)

## 2024-05-22 LAB — CYCLIC CITRUL PEPTIDE ANTIBODY, IGG/IGA: Cyclic Citrullin Peptide Ab: 10 U (ref 0–19)

## 2024-05-22 NOTE — Telephone Encounter (Signed)
 Submitted addt'l info to insurance.

## 2024-05-22 NOTE — Telephone Encounter (Signed)
 Pharmacy Patient Advocate Encounter  Received notification from [employer] that Prior Authorization for NURTEC has been DENIED.  Full denial letter will be uploaded to the media tab. See denial reason below.  NURTEC TAB 75MG  ODT is not FDA approved for your medical condition(s): Chronic Intractable Headache, unspecified headache type. These condition(s) are not supported by one of the accepted references. Therefore your drug is denied because it is not being used for a medically accepted indication.  PA #/Case ID/Reference #: [id number]

## 2024-05-26 ENCOUNTER — Other Ambulatory Visit: Payer: Self-pay | Admitting: Family Medicine

## 2024-05-26 ENCOUNTER — Ambulatory Visit
Admission: RE | Admit: 2024-05-26 | Discharge: 2024-05-26 | Disposition: A | Discharge status: Home / Self Care | Source: Ambulatory Visit | Attending: Student in an Organized Health Care Education/Training Program | Admitting: Student in an Organized Health Care Education/Training Program

## 2024-05-26 ENCOUNTER — Other Ambulatory Visit (HOSPITAL_COMMUNITY): Payer: Self-pay

## 2024-05-26 DIAGNOSIS — R0602 Shortness of breath: Secondary | ICD-10-CM | POA: Insufficient documentation

## 2024-05-26 DIAGNOSIS — G4452 New daily persistent headache (NDPH): Secondary | ICD-10-CM

## 2024-05-26 MED ORDER — NURTEC 75 MG PO TBDP: ORAL_TABLET | ORAL | 0 refills | Status: DC

## 2024-05-26 NOTE — Telephone Encounter (Signed)
 Faxed appeal letter with previous note from provider.   If any further info is requested, provider will need to contact insurance.

## 2024-05-26 NOTE — Addendum Note (Signed)
 Addended by: THEOPHILUS PAGAN on: 05/26/2024 09:08 AM   Modules accepted: Orders

## 2024-05-26 NOTE — Telephone Encounter (Signed)
 Updated diagnosis code to more accurately reflect patient diagnosis of intractable migraine not relieved with sumatriptan  therapy.  Patient would greatly benefit from daily suppressive migraine maintenance therapy.  Izetta Nap, DO

## 2024-05-27 ENCOUNTER — Ambulatory Visit: Payer: Self-pay | Admitting: Student in an Organized Health Care Education/Training Program

## 2024-05-28 NOTE — Telephone Encounter (Signed)
 Rec'd additional questions from patients insurance:   Does patient have greater than 4 migraines per month?  Does patient have greater than 8 migraines per month?  Will Nurtec be used in combination with another CGRP inhibitor for the preventative treatment of migraines? If yes, please specify which CGRP inhibitor.

## 2024-05-29 NOTE — Telephone Encounter (Signed)
 Addtl PA questions answered and faxed back to insurance 05/29/24.

## 2024-06-10 ENCOUNTER — Telehealth: Admitting: Nurse Practitioner

## 2024-06-10 DIAGNOSIS — J0140 Acute pansinusitis, unspecified: Secondary | ICD-10-CM

## 2024-06-10 MED ORDER — DOXYCYCLINE HYCLATE 100 MG PO TABS: 100.0000 mg | ORAL_TABLET | Freq: Two times a day (BID) | ORAL | 0 refills | Status: AC

## 2024-06-10 NOTE — Progress Notes (Signed)
 Virtual Visit Consent   Frank Terry Alert, you are scheduled for a virtual visit with a [place] provider today. Just as with appointments in the office, your consent must be obtained to participate. Your consent will be active for this visit and any virtual visit you may have with one of our providers in the next 365 days. If you have a MyChart account, a copy of this consent can be sent to you electronically.  As this is a virtual visit, video technology does not allow for your provider to perform a traditional examination. This may limit your provider's ability to fully assess your condition. If your provider identifies any concerns that need to be evaluated in person or the need to arrange testing (such as labs, EKG, etc.), we will make arrangements to do so. Although advances in technology are sophisticated, we cannot ensure that it will always work on either your end or our end. If the connection with a video visit is poor, the visit may have to be switched to a telephone visit. With either a video or telephone visit, we are not always able to ensure that we have a secure connection.  By engaging in this virtual visit, you consent to the provision of healthcare and authorize for your insurance to be billed (if applicable) for the services provided during this visit. Depending on your insurance coverage, you may receive a charge related to this service.  I need to obtain your verbal consent now. Are you willing to proceed with your visit today? KAYSIN BROCK has provided verbal consent on 06/10/2024 for a virtual visit (video or telephone). Lauraine Kitty, FNP  Date: 06/10/2024 3:08 PM   Virtual Visit via Video Note   I, Lauraine Kitty, connected with  Frank Terry  ([id number], 08/17/1966) on 06/10/24 at  3:15 PM EDT by a video-enabled telemedicine application and verified that I am speaking with the correct person using two identifiers.  Location: Patient: Virtual Visit Location Patient:  Home Provider: Virtual Visit Location Provider: Home Office   I discussed the limitations of evaluation and management by telemedicine and the availability of in person appointments. The patient expressed understanding and agreed to proceed.    History of Present Illness: Frank Terry is a 58 y.o. who identifies as a male who was assigned male at birth, and is being seen today for sinus congestion.  Symptom onset was two weeks ago Started as what the patient describes as a head cold. He has used over the counter decongestants and will get temporary relief from those. Feels a thick PND that has lead to a cough. 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   New daily persistent headache 09/25/2023   Soft tissue mass 09/11/2023   Nodule of skin of back 08/16/2023   Nodule of skin of head 08/16/2023   Blurry vision 08/16/2023   Allergic rhinitis 08/10/2023   Osteoporosis 08/10/2023   Skin lesion 08/10/2023   Fatigue 04/06/2023   Knee pain, bilateral 03/19/2023   Compression fracture of spine (HCC) 03/19/2023   Anxious mood 03/19/2023   B12 deficiency 03/19/2023   Hypertension 03/03/2023   Gastric ulcer 03/03/2023   Hyponatremia 03/02/2023   Asthma, chronic 03/02/2023   Gout 03/02/2023   Prostate cancer (HCC)  03/02/2023   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 Unsure of dose, Disp: , Rfl:    colchicine  0.6 MG tablet, Take 0.6 mg by mouth as needed., Disp: , Rfl:    empagliflozin  (JARDIANCE ) 10 MG TABS tablet, Take 1 tablet (10 mg total) by mouth daily before breakfast., Disp: 90 tablet, Rfl: 3   fluticasone -salmeterol (ADVAIR HFA) 230-21 MCG/ACT inhaler, Inhale 2 puffs into the lungs 2 (two) times daily., Disp: 1 each, Rfl: 12   irbesartan  (AVAPRO ) 150 MG tablet, Take 1 tablet (150 mg total) by mouth daily., Disp: 90 tablet, Rfl: 1   Rimegepant Sulfate (NURTEC) 75 MG TBDP, Please take every other day and you can use as needed dose on days you do not take the medication if you have a migraine, Disp: 30 tablet, Rfl: 0   rosuvastatin  (CRESTOR ) 20 MG tablet, Take 1 tablet (20 mg total) by mouth daily., Disp: 90 tablet, Rfl: 3   sildenafil  (VIAGRA ) 100 MG tablet, Take 1 tablet (100 mg total) by mouth daily as needed for erectile dysfunction., Disp: 30 tablet, Rfl: 11  Observations/Objective: Patient is well-developed, well-nourished in no acute distress.  Resting comfortably  at home.  Head is normocephalic, atraumatic.  No labored breathing.  Speech is clear and coherent with logical content.  Patient is alert and oriented at baseline.    Assessment and Plan:  1. Acute non-recurrent pansinusitis  Meds ordered this encounter  Medications   doxycycline  (VIBRA -TABS) 100 MG tablet    Sig: Take 1 tablet (100 mg total) by mouth 2 (two) times daily for 7 days.    Dispense:  14 tablet    Refill:  0    Advised use of inhaler as needed for cough, pair with Mucinex  for added relief  Follow up with new/worsening symptoms  Discussed sun protection while on antibiotics   Follow Up Instructions: I discussed the assessment and treatment plan with the patient. The patient was  provided an opportunity to ask questions and all were answered. The patient agreed with the plan and demonstrated an understanding of the instructions.  A copy of instructions were sent to the patient via MyChart unless otherwise noted below.    The patient was advised to call back or seek an in-person evaluation if the symptoms worsen or if the condition fails to improve as anticipated.    Lauraine Kitty, FNP

## 2024-06-12 ENCOUNTER — Other Ambulatory Visit (HOSPITAL_COMMUNITY): Payer: Self-pay

## 2024-06-12 NOTE — Telephone Encounter (Signed)
 Attempted test claim to check on status of appeal.   Per test claim, #8 tabs for 30 days is $0 copay.

## 2024-06-18 NOTE — Progress Notes (Signed)
 Initial neurology clinic note  Reason for Evaluation: Consultation requested by Anders Otto DASEN, MD for an opinion regarding headache and weakness. My final recommendations will be communicated back to the requesting physician by way of shared medical record or letter to requesting physician via [place]  mail.  HPI: This is Frank Terry, a 58 y.o. right-handed male with a medical history of HTN, asthma, lumbar spondylosis, prostate cancer, B12 deficiency, gastric ulcer, EtOH abuse, substance abuse who presents to neurology clinic with the chief complaint of headache and weakness. The patient is alone today.  Patient started having low back and disc issues in the late 90s. He was using EtOH and drugs for pain in addition to getting pain meds from his doctors. He would also take steroids. This continued throughout the years. In 2020 he had to stop working due to chronic pain. He was having pain in the back, hips, and had pain in his thighs. He went to the spine center at [facility]. He was found to have compression fractures in his low back. Patient denies trauma, but wonders if it was due to chronic steroids. He was told medications were his only option.  In 02/2023, patient got sober again. He was homeless at points as well. He started becoming more concerned about his health. He went to ortho who did another MRI. He was told there was a possibility of surgery to help. In 04/2023, he was diagnosed with stage 4 prostate cancer (spread to lymph nodes). He had surgery but did not require chemo or radiation. He states he is currently cancer free. Due to the prostate cancer, he did not follow up with ortho as planned. He does not currently have a pain management provide. He is managing currently with tylenol  and ibuprofen  and finds this to be okay for now. He takes 1000 mg of tylenol  and at least 800 mg of ibuprofen  daily. He is not on any other pain medication.  Since, he has a lot of fatigue. When he walks, his  leg will start to shake. He finds walking any distance very hard. He has had 2 falls in the last 4 months. His legs were shaking, he felt weak all over and short of breath and fell over. Another time, his right leg gave out. When he sits down, the shakiness will stop, but he still has pain and feels dizzy and off. He has been checked by pulmonology and cardiology without a problem found per patient. When at the store, leaning on a cart will help symptoms. He will also notice shaking of the arms when trying to hold things. His pain (neck to low back and shooting pains in legs and arms) has gotten worse progressively as well.   He also gets headaches. These started after the prostate surgery in 2024. He does not know if he was having headaches prior to this because of his addiction and self medicating. He pain typically starts in the neck and radiates into his entire head. When most severe, he will have photophobia and phonophobia. He rates the headache pain from 5-7/10. He has an average of 3-4 headaches per week. They can last a few hours to a whole day. He has been given sumatriptan  in the past which would knock down the pain a little. PCP prescribed Nurtec but this was never approved by insurance.  He has been on gabapentin  in the past (worked some but felt he abused it in the past). He was given amitriptyline years ago  for depression. He thinks he tolerated this okay. He is not currently on medication for depression.  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 Patient was seen by audiology on 04/30/24 who noted patient had mild high-frequency sensorineural hearing loss bilaterally. Audiology recommended ENT evaluation and use of noise maskers to distract from tinnitus at night. He has not looked into this yet.   MEDICATIONS:  Outpatient Encounter Medications as of 06/26/2024  Medication Sig   acetaminophen  (TYLENOL ) 500 MG  tablet Take 1,000 mg by mouth every 6 (six) hours as needed.   albuterol  (VENTOLIN  HFA) 108 (90 Base) MCG/ACT inhaler Inhale 2 puffs into the lungs every 6 (six) hours as needed for wheezing or shortness of breath.   cetirizine (ZYRTEC) 10 MG tablet Take 10 mg by mouth daily. Unsure of dose   colchicine  0.6 MG tablet Take 0.6 mg by mouth as needed.   empagliflozin  (JARDIANCE ) 10 MG TABS tablet Take 1 tablet (10 mg total) by mouth daily before breakfast.   fluticasone -salmeterol (ADVAIR HFA) 230-21 MCG/ACT inhaler Inhale 2 puffs into the lungs 2 (two) times daily.   irbesartan  (AVAPRO ) 150 MG tablet Take 1 tablet (150 mg total) by mouth daily.   Rimegepant Sulfate (NURTEC) 75 MG TBDP Please take every other day and you can use as needed dose on days you do not take the medication if you have a migraine   rosuvastatin  (CRESTOR ) 20 MG tablet Take 1 tablet (20 mg total) by mouth daily.   sildenafil  (VIAGRA ) 100 MG tablet Take 1 tablet (100 mg total) by mouth daily as needed for erectile dysfunction.   No facility-administered encounter medications on file as of 06/26/2024.    PAST MEDICAL HISTORY: Past Medical History:  Diagnosis Date   Alcohol use disorder    pt currently lives at the [facility]   Allergy    Anxiety    Arthritis    Asthma    well controlled   Compression fracture of spine (HCC)    Congenital absence of one kidney    pt has right kidney   Depression    Gastric ulcer    GERD (gastroesophageal reflux disease)    no meds   Gout    HTN (hypertension)    Hyponatremia    Osteoporosis    Prostate cancer (HCC)    Ulcer    Vasitis 05/2023    PAST SURGICAL HISTORY: Past Surgical History:  Procedure Laterality Date   COLONOSCOPY WITH ESOPHAGOGASTRODUODENOSCOPY (EGD)     MASS EXCISION Left 09/19/2023   Procedure: EXCISION MASS, deep soft tissue mass back;  Surgeon: Lane Shope, MD;  Location: [facility] ORS;  Service: General;  Laterality: Left;   PELVIC LYMPH NODE  DISSECTION Bilateral 06/18/2023   Procedure: PELVIC LYMPH NODE DISSECTION;  Surgeon: Penne Knee, MD;  Location: [facility] ORS;  Service: Urology;  Laterality: Bilateral;   PROSTATE BIOPSY     ROBOT ASSISTED LAPAROSCOPIC RADICAL PROSTATECTOMY N/A 06/18/2023   Procedure: XI ROBOTIC ASSISTED LAPAROSCOPIC RADICAL PROSTATECTOMY;  Surgeon: Penne Knee, MD;  Location: [facility] ORS;  Service: Urology;  Laterality: N/A;   TONSILLECTOMY     age 44    ALLERGIES: Allergies  Allergen Reactions   Penicillins Rash    FAMILY HISTORY: Family History  Problem Relation Age of Onset   Hypertension Mother    Lung cancer Mother    Osteoporosis Mother    Alcohol abuse Mother    Arthritis Mother  Cancer Mother    Heart disease Mother    Hypertension Father    Cancer Father        Abdominal   Heart attack Father    Hypertension Sister    Multiple sclerosis Sister     SOCIAL HISTORY: Social History   Tobacco Use   Smoking status: Former    Types: Cigarettes    Start date: 1984   Smokeless tobacco: Former    Types: Chew   Tobacco comments:    Nicotine  patches   Vaping Use   Vaping status: Never Used  Substance Use Topics   Alcohol use: Not Currently    Alcohol/week: 168.0 standard drinks of alcohol    Types: 168 Cans of beer per week    Comment: pt lives in the [place] home for alcohol addiction   Drug use: Not Currently   Social History   Social History Narrative   Right handed   Lives at an [place] house with 7 other men     OBJECTIVE: PHYSICAL EXAM: BP 136/87 (BP Location: Left Arm, Patient Position: Sitting)   Pulse (!) 105   Ht 5' 9 (1.753 m)   Wt 212 lb (96.2 kg)   SpO2 96%   BMI 31.31 kg/m   General: General appearance: Awake and Terry. No distress. Cooperative with exam.  Skin: No obvious rash or jaundice. HEENT: Atraumatic. Anicteric. Pain on palpation of cervical paraspinal muscles. Reduced range of motion. Lungs: Non-labored breathing on room air  Heart:  Regular Extremities: No edema. No obvious deformity.  Musculoskeletal: No obvious joint swelling. Psych: Affect appropriate.  Neurological: Mental Status: Terry. Speech fluent. No pseudobulbar affect Cranial Nerves: CNII: No RAPD. Visual fields grossly intact. CNIII, IV, VI: PERRL. No nystagmus. EOMI. CN V: Facial sensation diminished on right to light touch and pin prick.  CN VII: Facial muscles symmetric and strong. No ptosis at rest CN VIII: Hearing grossly intact bilaterally. CN IX: No hypophonia. CN X: Palate elevates symmetrically. CN XI: Full strength shoulder shrug bilaterally. CN XII: Tongue protrusion full and midline. No atrophy or fasciculations. No significant dysarthria Motor: Tone is normal.  Individual muscle group testing (MRC grade out of 5):  Movement     Neck flexion 5    Neck extension 5     Right Left   Shoulder abduction 5 5   Elbow flexion 5 5   Elbow extension 5 5   Finger abduction - FDI 5 5   Finger abduction - ADM 5 5   Finger extension 5 5   Finger distal flexion - 2/3 5 5    Finger distal flexion - 4/5 5 5    Thumb flexion - FPL 5 5   Thumb abduction - APB 5 5    Hip flexion 5 5   Hip extension 5 5   Hip adduction 5 5   Hip abduction 5 5   Knee extension 5 5   Knee flexion 5 5   Dorsiflexion 5 5   Plantarflexion 5 5     Reflexes:  Right Left   Bicep 2+ 2+   Tricep 2+ 2+   BrRad 2+ 2+   Knee 3+ 3+ Cross adductors bilaterally  Ankle 0 2+    Pathological Reflexes: Babinski: flexor response bilaterally Hoffman: absent bilaterally Troemner: absent bilaterally Sensation: Pinprick: Patchy diminished sensation in right thigh, left foot Vibration: Intact in all extremities Proprioception: Intact in bilateral great toes Coordination: Intact finger-to- nose-finger bilaterally. Romberg negative. Gait: Able to rise from  chair with arms crossed unassisted. Walks with a cane. Narrow based. Antalgic.  Lab and Test Review: Internal  labs: 05/20/24: RF negative CCP negative ANA negative Folate wnl B12: 508  04/02/24: CRP wnl ESR wnl  Lipid panel (02/21/24): tChol 260, LDL 187, TG 134 TSH (09/25/23): low at 0.367 Vit D (09/25/23) wnl  Imaging/Procedures: MRI lumbar spine wo contrast (04/04/23): FINDINGS: Segmentation:  Standard.   Alignment:  Unchanged trace anterolisthesis at T11-T12.   Vertebrae: No acute fracture, evidence of discitis, or bone lesion. Chronic T11, T12, and L1 superior endplate compression deformities.   Conus medullaris and cauda equina: Conus extends to the L2 level. Conus and cauda equina appear normal.   Paraspinal and other soft tissues: Absent left kidney. Otherwise negative.   Disc levels:   T11-T12: Minimal retropulsion of the T12 posterosuperior endplate. Minimal disc bulging and mild bilateral facet arthropathy. No stenosis.   T12-L1:  Minimal disc bulging.  No stenosis.   L1-L2:  Minimal disc bulging.  No stenosis.   L2-L3:  Mild disc bulging.  No stenosis.   L3-L4: Mild disc bulging and bilateral facet arthropathy. Mild bilateral lateral recess stenosis. No spinal canal or neuroforaminal stenosis.   L4-L5: Mild disc bulging and bilateral facet arthropathy. Mild bilateral lateral recess and neuroforaminal stenosis. No spinal canal stenosis.   L5-S1: Mild disc bulging. Right subarticular annular fissure. Mild bilateral facet arthropathy. Mild right lateral recess stenosis. Mild bilateral neuroforaminal stenosis. No spinal canal stenosis.  IMPRESSION: 1. Mild multilevel lumbar spondylosis as described above. No high-grade stenosis or impingement. 2. Chronic T11, T12, and L1 superior endplate compression deformities.  MRI brain w/wo contrast (04/18/24): FINDINGS: Brain: There is no acute infarction or intracranial hemorrhage. There is no intracranial mass, mass effect, or edema. There is no hydrocephalus or extra-axial fluid collection. Incidental small  left frontal developmental venous anomaly. There is 2 mm focus of ill-defined enhancement along the left insula (series 119, image 84). There is corresponding susceptibility hypointensity. Minimal punctate foci of T2 hyperintensity in the supratentorial white matter reflecting nonspecific gliosis/demyelination of doubtful significance.   Vascular: Major vessel flow voids at the skull base are preserved.   Skull and upper cervical spine: Normal marrow signal is preserved.   Sinuses/Orbits: Paranasal sinus mucosal thickening. Orbits are unremarkable.   Other: Sella is partially empty.  Mastoid air cells are clear.   IMPRESSION: No intracranial mass or edema. 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 Noting delay between study date and this report, consider a single follow-up study in a few weeks.  Audiology (04/30/24): CONCLUSION:  Frank Terry has a mild high frequency sensorineural hearing loss bilaterally.  Word recognition is good in quiet at conversational speech levels bilaterally. Findings were reviewed with the patient.    RECOMMENDATIONS: Medical evaluation by an Ear, Nose and Throat physician.   Use of noise maskers or fans to distract from tinnitus at night.  Sniff test (05/26/24): IMPRESSION: Normal bilateral diaphragmatic motion during inspiration, expiration and sniffing.   No evidence of diaphragmatic paralysis.  ASSESSMENT: Frank Terry is a 58 y.o. male who presents for evaluation of chronic neck and back pain, weakness of arms and legs, and headaches. He has a relevant medical history of HTN, asthma, lumbar spondylosis, prostate cancer, B12 deficiency, gastric ulcer, EtOH abuse, substance abuse. His neurological examination is pertinent for asymmetric sensation in patchy distribution, hyperreflexia at patella, and absent right ankle jerk reflex. This  is a complex case with multiple potential etiologies. He has known compression fractures in the low back that may be causing pain. A lot of his leg symptoms sound consistent with neurogenic claudication. Per patient, surgery was discussed years ago prior to prostate cancer diagnosis which prevented further follow up. He is interested in reconnecting with someone in spine, but closer to home [facility] area). He also has neck pain and symptoms in his arms, so a cervical spine issue is also possible, and given his hyperreflexia at the patella, even cervical stenosis.   His headaches could be due to cervigalgia, but also have features of migraines. He is currently having 3-4 headaches per week, so a preventative medication that could also treat chronic pain is reasonable to try. He tolerated a TCA in the past, so nortriptyline  is likely a good option. MRI brain showed a possible vascular malformation, which is likely not the etiology of headaches, but a repeat MRI was recommended, so I will order this today with MRI of cervical spine to evaluate the neck further.  Patient was also seen by audiology who recommended ENT referral for hearing loss and tinnitus, so I will also place this referral.  PLAN: -Blood work: B1, TSH, IFE -Repeat MRI brain w/wo contrast as recommended by radiology -MRI cervical spine wo contrast -Referral to spine surgery -Referral to ENT as recommended by audiology  -For headaches: Headache prevention:  Start nortriptyline  25 mg at bedtime Headache rescue:  Start rizatriptan  10 mg as needed at headache, can repeat in 2 hours if needed Limit use of pain relievers to no more than 2 days out of week to prevent risk of rebound or medication-overuse headache. Keep headache diary Will consider PT for cervicalgia pending results of MRI cervical spine   -Return to clinic in 3 months   The impression above as well as the plan as outlined below were extensively discussed with the  patient who voiced understanding. All questions were answered to their satisfaction.  The patient was counseled on pertinent fall precautions per the printed material provided today, and as noted under the Patient Instructions section below.  When available, results of the above investigations and possible further recommendations will be communicated to the patient via telephone/MyChart. Patient to call office if not contacted after expected testing turnaround time.   Total time spent reviewing records, interview, history/exam, documentation, and coordination of care on day of encounter:  70 min   Thank you for allowing me to participate in patient's care.  If I can answer any additional questions, I would be pleased to do so.  Venetia Potters, MD   CC: Theophilus Pagan, MD [address] [place] [address]  CC: Referring provider: Anders Otto DASEN, MD [address] [place],  [place] [address]

## 2024-06-26 ENCOUNTER — Ambulatory Visit

## 2024-06-26 ENCOUNTER — Other Ambulatory Visit

## 2024-06-26 ENCOUNTER — Encounter (INDEPENDENT_AMBULATORY_CARE_PROVIDER_SITE_OTHER): Payer: Self-pay

## 2024-06-26 ENCOUNTER — Ambulatory Visit (INDEPENDENT_AMBULATORY_CARE_PROVIDER_SITE_OTHER): Admitting: Neurology

## 2024-06-26 ENCOUNTER — Encounter: Payer: Self-pay | Admitting: Neurology

## 2024-06-26 VITALS — BP 136/87 | HR 105 | Ht 69.0 in | Wt 212.0 lb

## 2024-06-26 VITALS — BP 136/87 | Ht 69.0 in | Wt 212.0 lb

## 2024-06-26 DIAGNOSIS — M47816 Spondylosis without myelopathy or radiculopathy, lumbar region: Secondary | ICD-10-CM

## 2024-06-26 DIAGNOSIS — H9193 Unspecified hearing loss, bilateral: Secondary | ICD-10-CM

## 2024-06-26 DIAGNOSIS — G43709 Chronic migraine without aura, not intractable, without status migrainosus: Secondary | ICD-10-CM

## 2024-06-26 DIAGNOSIS — Z0000 Encounter for general adult medical examination without abnormal findings: Secondary | ICD-10-CM

## 2024-06-26 DIAGNOSIS — M545 Low back pain, unspecified: Secondary | ICD-10-CM

## 2024-06-26 DIAGNOSIS — G8929 Other chronic pain: Secondary | ICD-10-CM

## 2024-06-26 DIAGNOSIS — M542 Cervicalgia: Secondary | ICD-10-CM

## 2024-06-26 DIAGNOSIS — R9089 Other abnormal findings on diagnostic imaging of central nervous system: Secondary | ICD-10-CM

## 2024-06-26 MED ORDER — RIZATRIPTAN BENZOATE 10 MG PO TABS: 10.0000 mg | ORAL_TABLET | ORAL | 5 refills | Status: DC | PRN

## 2024-06-26 MED ORDER — NORTRIPTYLINE HCL 25 MG PO CAPS: 25.0000 mg | ORAL_CAPSULE | Freq: Every day | ORAL | 5 refills | Status: DC

## 2024-06-26 NOTE — Progress Notes (Signed)
 Because this visit was a virtual/telehealth visit,  certain criteria was not obtained, such a blood pressure, CBG if applicable, and timed get up and go. Any medications not marked as taking were not mentioned during the medication reconciliation part of the visit. Any vitals not documented were not able to be obtained due to this being a telehealth visit or patient was unable to self-report a recent blood pressure reading due to a lack of equipment at home via telehealth. Vitals that have been documented are verbally provided by the patient.   Subjective:   Frank Terry is a 58 y.o. who presents for a Medicare Wellness preventive visit.  As a reminder, Annual Wellness Visits don't include a physical exam, and some assessments may be limited, especially if this visit is performed virtually. We may recommend an in-person follow-up visit with your provider if needed.  Visit Complete: Virtual I connected with  Frank Terry on 06/26/24 by a video and audio enabled telemedicine application and verified that I am speaking with the correct person using two identifiers.  Patient Location: Home  Provider Location: Home Office  I discussed the limitations of evaluation and management by telemedicine. The patient expressed understanding and agreed to proceed.  Vital Signs: Because this visit was a virtual/telehealth visit, some criteria may be missing or patient reported. Any vitals not documented were not able to be obtained and vitals that have been documented are patient reported.  Persons Participating in Visit: Patient.  AWV Questionnaire: Yes: Patient Medicare AWV questionnaire was completed by the patient on 06/23/2024; I have confirmed that all information answered by patient is correct and no changes since this date.  Cardiac Risk Factors include: advanced age (>75men, >39 women);hypertension;male gender;sedentary lifestyle;dyslipidemia;family history of premature cardiovascular disease      Objective:    Today's Vitals   06/26/24 1454  BP: 136/87  Weight: 212 lb (96.2 kg)  Height: 5' 9 (1.753 m)  PainSc: 6   PainLoc: Generalized   Body mass index is 31.31 kg/m.     06/26/2024    2:59 PM 06/26/2024    7:48 AM 05/20/2024   10:59 AM 03/20/2024   10:11 AM 02/07/2024    8:50 AM 01/12/2024    8:23 AM 09/19/2023    6:22 AM  Advanced Directives  Does Patient Have a Medical Advance Directive? No No No No No No No  Would patient like information on creating a medical advance directive? No - Patient declined    No - Patient declined No - Patient declined No - Patient declined    Current Medications (verified) Outpatient Encounter Medications as of 06/26/2024  Medication Sig   acetaminophen  (TYLENOL ) 500 MG tablet Take 1,000 mg by mouth every 6 (six) hours as needed.   albuterol  (VENTOLIN  HFA) 108 (90 Base) MCG/ACT inhaler Inhale 2 puffs into the lungs every 6 (six) hours as needed for wheezing or shortness of breath.   cetirizine (ZYRTEC) 10 MG tablet Take 10 mg by mouth daily. Unsure of dose   colchicine  0.6 MG tablet Take 0.6 mg by mouth as needed.   fluticasone -salmeterol (ADVAIR HFA) 230-21 MCG/ACT inhaler Inhale 2 puffs into the lungs 2 (two) times daily.   irbesartan  (AVAPRO ) 150 MG tablet Take 1 tablet (150 mg total) by mouth daily.   nortriptyline  (PAMELOR ) 25 MG capsule Take 1 capsule (25 mg total) by mouth at bedtime.   rizatriptan  (MAXALT ) 10 MG tablet Take 1 tablet (10 mg total) by mouth as needed for  migraine. May repeat in 2 hours if needed   sildenafil  (VIAGRA ) 100 MG tablet Take 1 tablet (100 mg total) by mouth daily as needed for erectile dysfunction.   [DISCONTINUED] rosuvastatin  (CRESTOR ) 20 MG tablet Take 1 tablet (20 mg total) by mouth daily.   [DISCONTINUED] empagliflozin  (JARDIANCE ) 10 MG TABS tablet Take 1 tablet (10 mg total) by mouth daily before breakfast.   No facility-administered encounter medications on file as of 06/26/2024.    Allergies  (verified) Penicillins   History: Past Medical History:  Diagnosis Date   Alcohol use disorder    pt currently lives at the [facility]   Allergy    Anxiety    Arthritis    Asthma    well controlled   Compression fracture of spine (HCC)    Congenital absence of one kidney    pt has right kidney   Depression    Gastric ulcer    GERD (gastroesophageal reflux disease)    no meds   Gout    HTN (hypertension)    Hyponatremia    Osteoporosis    Prostate cancer (HCC)    Ulcer    Vasitis 05/2023   Past Surgical History:  Procedure Laterality Date   COLONOSCOPY WITH ESOPHAGOGASTRODUODENOSCOPY (EGD)     MASS EXCISION Left 09/19/2023   Procedure: EXCISION MASS, deep soft tissue mass back;  Surgeon: Lane Shope, MD;  Location: [facility] ORS;  Service: General;  Laterality: Left;   PELVIC LYMPH NODE DISSECTION Bilateral 06/18/2023   Procedure: PELVIC LYMPH NODE DISSECTION;  Surgeon: Penne Knee, MD;  Location: [facility] OR[facility];  Service: Urology;  Laterality: Bilateral;   PROSTATE BIOPSY     ROBOT ASSISTED LAPAROSCOPIC RADICAL PROSTATECTOMY N/A 06/18/2023   Procedure: XI ROBOTIC ASSISTED LAPAROSCOPIC RADICAL PROSTATECTOMY;  Surgeon: Penne Knee, MD;  Location: [facility] OR[facility];  Service: Urology;  Laterality: N/A;   TONSILLECTOMY     age 1   Family History  Problem Relation Age of Onset   Hypertension Mother    Lung cancer Mother    Osteoporosis Mother    Alcohol abuse Mother    Arthritis Mother    Cancer Mother    Heart disease Mother    Hypertension Father    Cancer Father        Abdominal   Heart attack Father    Hypertension Sister    Multiple sclerosis Sister    Social History   Socioeconomic History   Marital status: Divorced    Spouse name: Not on file   Number of children: Not on file   Years of education: Not on file   Highest education level: Some college, no degree  Occupational History   Not on file  Tobacco Use   Smoking status: Former    Types: Cigarettes     Start date: 1984   Smokeless tobacco: Former    Types: Chew   Tobacco comments:    Nicotine  patches   Vaping Use   Vaping status: Never Used  Substance and Sexual Activity   Alcohol use: Not Currently    Alcohol/week: 168.0 standard drinks of alcohol    Types: 168 Cans of beer per week    Comment: pt lives in the [place] [facility] for alcohol addiction   Drug use: Not Currently   Sexual activity: Yes    Birth control/protection: None  Other Topics Concern   Not on file  Social History Narrative   Right handed   Lives at an [facility] with 7 other men  Social Drivers of Health   Financial Resource Strain: Medium Risk (06/26/2024)   Overall Financial Resource Strain (CARDIA)    Difficulty of Paying Living Expenses: Somewhat hard  Food Insecurity: Food Insecurity Present (06/26/2024)   Hunger Vital Sign    Worried About Running Out of Food in the Last Year: Sometimes true    Ran Out of Food in the Last Year: Sometimes true  Transportation Needs: No Transportation Needs (06/26/2024)   PRAPARE - Administrator, Civil Service (Medical): No    Lack of Transportation (Non-Medical): No  Physical Activity: Inactive (06/26/2024)   Exercise Vital Sign    Days of Exercise per Week: 0 days    Minutes of Exercise per Session: 10 min  Stress: No Stress Concern Present (06/26/2024)   [employer] of Occupational Health - Occupational Stress Questionnaire    Feeling of Stress: Not at all  Social Connections: Moderately Integrated (06/26/2024)   Social Connection and Isolation Panel    Frequency of Communication with Friends and Family: More than three times a week    Frequency of Social Gatherings with Friends and Family: More than three times a week    Attends Religious Services: More than 4 times per year    Active Member of [employer] or Organizations: Yes    Attends Engineer, structural: More than 4 times per year    Marital Status: Divorced    Tobacco  Counseling Counseling given: Not Answered Tobacco comments: Nicotine  patches     Clinical Intake:  Pre-visit preparation completed: Yes  Pain : 0-10 Pain Score: 6  (CHRONIC PAIN) Pain Type: Chronic pain Pain Location: Generalized (NECK, LOWER BACK, HEAD)     BMI - recorded: 31.31 Nutritional Status: BMI > 30  Obese Nutritional Risks: None Diabetes: No  No results found for: HGBA1C   How often do you need to have someone help you when you Terry instructions, pamphlets, or other written materials from your doctor or pharmacy?: 1 - Never  Interpreter Needed?: No  Information entered by :: Roz Fuller, LPN.   Activities of Daily Living     06/26/2024    3:12 PM 06/23/2024    2:07 AM  In your present state of health, do you have any difficulty performing the following activities:  Hearing? 0 0  Vision? 0 0  Difficulty concentrating or making decisions? 0 0  Walking or climbing stairs? 1 1  Dressing or bathing? 0 0  Doing errands, shopping? 1 1  Preparing Food and eating ? Y Y  Using the Toilet? N N  In the past six months, have you accidently leaked urine? Y Y  Do you have problems with loss of bowel control? N N  Managing your Medications? N N  Managing your Finances? N N  Housekeeping or managing your Housekeeping? CINDERELLA CINDERELLA    Patient Care Team: Theophilus Pagan, MD as PCP - General (Family Medicine) Twylla Glendia BROCKS, MD (Urology) Georgina Ozell LABOR, MD as Consulting Physician (Orthopedic Surgery)  I have updated your Care Teams any recent Medical Services you may have received from other providers in the past year.     Assessment:   This is a routine wellness examination for Sargon.  Hearing/Vision screen Hearing Screening - Comments:: Tinnitus in bilateral ears. Vision Screening - Comments:: Wears rx glasses - up to date with routine eye exams with [facility]    Goals Addressed  This Visit's Progress     Patient stated: (pt-stated)         06/26/2024: To have more mobility in my back.       Depression Screen     06/26/2024    3:01 PM 05/20/2024   11:35 AM 04/04/2024   11:20 AM 02/07/2024    8:55 AM 09/25/2023    2:51 PM 08/10/2023    1:48 PM 05/04/2023    9:23 PM  PHQ 2/9 Scores  PHQ - 2 Score 0 0 0 0 0 2 0  PHQ- 9 Score 4 9 6  0 9 5 7     Fall Risk     06/26/2024    3:00 PM 06/26/2024    7:48 AM 06/23/2024    2:07 AM 04/04/2024   11:18 AM 05/04/2023    9:22 PM  Fall Risk   Falls in the past year? 1 1 1  0 0  Number falls in past yr: 1 1 1   0  Injury with Fall? 0 0 0  0  Risk for fall due to : History of fall(s);Impaired balance/gait;Orthopedic patient    No Fall Risks  Follow up Falls evaluation completed;Education provided Falls evaluation completed   Falls prevention discussed;Education provided;Falls evaluation completed    MEDICARE RISK AT HOME:  Medicare Risk at Home Any stairs in or around the home?: Yes If so, are there any without handrails?: No Home free of loose throw rugs in walkways, pet beds, electrical cords, etc?: Yes Adequate lighting in your home to reduce risk of falls?: Yes Life Terry?: No Use of a cane, walker or w/c?: Yes Grab bars in the bathroom?: No Shower chair or bench in shower?: No Elevated toilet seat or a handicapped toilet?: No  TIMED UP AND GO:  Was the test performed?  No  Cognitive Function: Declined/Normal: No cognitive concerns noted by patient or family. Patient Terry, oriented, able to answer questions appropriately and recall recent events. No signs of memory loss or confusion.    06/26/2024    3:03 PM  MMSE - Mini Mental State Exam  Not completed: Unable to complete        06/26/2024    3:12 PM 05/04/2023    9:26 PM  6CIT Screen  What Year? 0 points 0 points  What month? 0 points 0 points  What time? 0 points 0 points  Count back from 20 0 points 0 points  Months in reverse 0 points 0 points  Repeat phrase 0 points 0 points  Total Score 0 points 0 points     Immunizations Immunization History  Administered Date(s) Administered   Influenza,inj,Quad PF,6+ Mos 08/31/2015   Influenza-Unspecified 08/13/2019   PFIZER(Purple Top)SARS-COV-2 Vaccination 12/01/2020    Screening Tests Health Maintenance  Topic Date Due   DTaP/Tdap/Td (1 - Tdap) Never done   Pneumococcal Vaccine: 19-49 Years (1 of 2 - PCV) Never done   Pneumococcal Vaccine: 50+ Years (1 of 2 - PCV) Never done   Zoster Vaccines- Shingrix (1 of 2) Never done   Colonoscopy  Never done   INFLUENZA VACCINE  06/20/2024   COVID-19 Vaccine (2 - Pfizer risk series) 10/10/2024 (Originally 12/22/2020)   Medicare Annual Wellness (AWV)  06/26/2025   Hepatitis C Screening  Completed   HIV Screening  Completed   Hepatitis B Vaccines  Aged Out   HPV VACCINES  Aged Out   Meningococcal B Vaccine  Aged Out   Lung Cancer Screening  Discontinued    Health Maintenance  Health Maintenance Due  Topic Date Due   DTaP/Tdap/Td (1 - Tdap) Never done   Pneumococcal Vaccine: 19-49 Years (1 of 2 - PCV) Never done   Pneumococcal Vaccine: 50+ Years (1 of 2 - PCV) Never done   Zoster Vaccines- Shingrix (1 of 2) Never done   Colonoscopy  Never done   INFLUENZA VACCINE  06/20/2024   Health Maintenance Items Addressed: Yes Patient declined vaccines. Patient will get a Flu and Shingrix vaccine at local pharmacy.  Additional Screening:  Vision Screening: Recommended annual ophthalmology exams for early detection of glaucoma and other disorders of the eye. Would you like a referral to an eye doctor? No    Dental Screening: Recommended annual dental exams for proper oral hygiene  Community Resource Referral / Chronic Care Management: CRR required this visit?  No   CCM required this visit?  PCP informed of CCM need   Plan:    I have personally reviewed and noted the following in the patient's chart:   Medical and social history Use of alcohol, tobacco or illicit drugs  Current medications and  supplements including opioid prescriptions. Patient is not currently taking opioid prescriptions. Functional ability and status Nutritional status Physical activity Advanced directives List of other physicians Hospitalizations, surgeries, and ER visits in previous 12 months Vitals Screenings to include cognitive, depression, and falls Referrals and appointments  In addition, I have reviewed and discussed with patient certain preventive protocols, quality metrics, and best practice recommendations. A written personalized care plan for preventive services as well as general preventive health recommendations were provided to patient.   Roz LOISE Fuller, LPN   11/21/7972   After Visit Summary: (MyChart) Due to this being a telephonic visit, the after visit summary with patients personalized plan was offered to patient via MyChart   Notes: Patient declined vaccines. Patient will get a Flu and Shingrix vaccine at local pharmacy.

## 2024-06-26 NOTE — Patient Instructions (Signed)
 Mr. Hohn , Thank you for taking time out of your busy schedule to complete your Annual Wellness Visit with me. I enjoyed our conversation and look forward to speaking with you again next year. I, as well as your care team,  appreciate your ongoing commitment to your health goals. Please review the following plan we discussed and let me know if I can assist you in the future. Your Game plan/ To Do List    Referrals: If you haven't heard from the office you've been referred to, please reach out to them at the phone provided.   Follow up Visits: We will see or speak with you next year for your Next Medicare AWV with our clinical staff Have you seen your provider in the last 6 months (3 months if uncontrolled diabetes)? Yes  Clinician Recommendations:  Aim for 30 minutes of exercise or brisk walking, 6-8 glasses of water , and 5 servings of fruits and vegetables each day.       This is a list of the screenings recommended for you:  Health Maintenance  Topic Date Due   DTaP/Tdap/Td vaccine (1 - Tdap) Never done   Pneumococcal Vaccine for high risk medical condition (1 of 2 - PCV) Never done   Pneumococcal Vaccine for age over 79 (1 of 2 - PCV) Never done   Zoster (Shingles) Vaccine (1 of 2) Never done   Colon Cancer Screening  Never done   Flu Shot  06/20/2024   COVID-19 Vaccine (2 - Pfizer risk series) 10/10/2024*   Medicare Annual Wellness Visit  06/26/2025   Hepatitis C Screening  Completed   HIV Screening  Completed   Hepatitis B Vaccine  Aged Out   HPV Vaccine  Aged Out   Meningitis B Vaccine  Aged Out   Screening for Lung Cancer  Discontinued  *Topic was postponed. The date shown is not the original due date.    Advanced directives: (Declined) Advance directive discussed with you today. Even though you declined this today, please call our office should you change your mind, and we can give you the proper paperwork for you to fill out. Advance Care Planning is important because  it:  [x]  Makes sure you receive the medical care that is consistent with your values, goals, and preferences  [x]  It provides guidance to your family and loved ones and reduces their decisional burden about whether or not they are making the right decisions based on your wishes.  Follow the link provided in your after visit summary or read over the paperwork we have mailed to you to help you started getting your Advance Directives in place. If you need assistance in completing these, please reach out to us  so that we can help you!  See attachments for Preventive Care and Fall Prevention Tips.

## 2024-06-26 NOTE — Patient Instructions (Addendum)
 I saw you today for back pain, arm and leg, weakness, and headaches.  Your neck and back pain with arm and leg weakness is concerning for a problem with the spine and/or pinched nerves in the neck and back.  Your headaches may be migraines with your neck also contributing.  I would like to get blood work today.  I will get a repeat of your MRI brain as recommended by radiology and also get an MRI of your neck (cervical spine).  I will refer you to someone in spine closer to home for the known back problems.  I will refer you to ENT as recommended by the hearing specialists.  For your headaches: Headache prevention:  Start nortriptyline  25 mg at bedtime Headache rescue:  Start rizatriptan  10 mg as needed at headache, can repeat in 2 hours if needed Limit use of pain relievers to no more than 2 days out of week to prevent risk of rebound or medication-overuse headache. Keep headache diary Will consider PT for neck pain pending results of MRI cervical spine  I will be in touch when I have your results.   I'll see you back in clinic in about 3 months.  Please let me know if you have any questions or concerns in the meantime.  The physicians and staff at [facility] are committed to providing excellent care. You may receive a survey requesting feedback about your experience at our office. We strive to receive very good responses to the survey questions. If you feel that your experience would prevent you from giving the office a very good  response, please contact our office to try to remedy the situation. We may be reached at [phone]. Thank you for taking the time out of your busy day to complete the survey.  Venetia Potters, MD [facility]  Preventing Falls at [facility] are common, often dreaded events in the lives of older people. Aside from the obvious injuries and even death that may result, fall can cause wide-ranging consequences including loss of independence,  mental decline, decreased activity and mobility. Younger people are also at risk of falling, especially those with chronic illnesses and fatigue.  Ways to reduce risk for falling Examine diet and medications. Warm foods and alcohol dilate blood vessels, which can lead to dizziness when standing. Sleep aids, antidepressants and pain medications can also increase the likelihood of a fall.  Get a vision exam. Poor vision, cataracts and glaucoma increase the chances of falling.  Check foot gear. Shoes should fit snugly and have a sturdy, nonskid sole and a broad, low heel  Participate in a physician-approved exercise program to build and maintain muscle strength and improve balance and coordination. Programs that use ankle weights or stretch bands are excellent for muscle-strengthening. Water  aerobics programs and low-impact Tai Chi programs have also been shown to improve balance and coordination.  Increase vitamin D  intake. Vitamin D  improves muscle strength and increases the amount of calcium  the body is able to absorb and deposit in bones.  How to prevent falls from common hazards Floors - Remove all loose wires, cords, and throw rugs. Minimize clutter. Make sure rugs are anchored and smooth. Keep furniture in its usual place.  Chairs -- Use chairs with straight backs, armrests and firm seats. Add firm cushions to existing pieces to add height.  Bathroom - Install grab bars and non-skid tape in the tub or shower. Use a bathtub transfer bench or a shower chair with a back support  Use an elevated toilet seat and/or safety rails to assist standing from a low surface. Do not use towel racks or bathroom tissue holders to help you stand.  Lighting - Make sure halls, stairways, and entrances are well-lit. Install a night light in your bathroom or hallway. Make sure there is a light switch at the top and bottom of the staircase. Turn lights on if you get up in the middle of the night. Make sure lamps or  light switches are within reach of the bed if you have to get up during the night.  Kitchen - Install non-skid rubber mats near the sink and stove. Clean spills immediately. Store frequently used utensils, pots, pans between waist and eye level. This helps prevent reaching and bending. Sit when getting things out of lower cupboards.  Living room/ Bedrooms - Place furniture with wide spaces in between, giving enough room to move around. Establish a route through the living room that gives you something to hold onto as you walk.  Stairs - Make sure treads, rails, and rugs are secure. Install a rail on both sides of the stairs. If stairs are a threat, it might be helpful to arrange most of your activities on the lower level to reduce the number of times you must climb the stairs.  Entrances and doorways - Install metal handles on the walls adjacent to the doorknobs of all doors to make it more secure as you travel through the doorway.  Tips for maintaining balance Keep at least one hand free at all times. Try using a backpack or fanny pack to hold things rather than carrying them in your hands. Never carry objects in both hands when walking as this interferes with keeping your balance.  Attempt to swing both arms from front to back while walking. This might require a conscious effort if Parkinson's disease has diminished your movement. It will, however, help you to maintain balance and posture, and reduce fatigue.  Consciously lift your feet off of the ground when walking. Shuffling and dragging of the feet is a common culprit in losing your balance.  When trying to navigate turns, use a U technique of facing forward and making a wide turn, rather than pivoting sharply.  Try to stand with your feet shoulder-length apart. When your feet are close together for any length of time, you increase your risk of losing your balance and falling.  Do one thing at a time. Don't try to walk and accomplish  another task, such as reading or looking around. The decrease in your automatic reflexes complicates motor function, so the less distraction, the better.  Do not wear rubber or gripping soled shoes, they might catch on the floor and cause tripping.  Move slowly when changing positions. Use deliberate, concentrated movements and, if needed, use a grab bar or walking aid. Count 15 seconds between each movement. For example, when rising from a seated position, wait 15 seconds after standing to begin walking.  If balance is a continuous problem, you might want to consider a walking aid such as a cane, walking stick, or walker. Once you've mastered walking with help, you might be ready to try it on your own again.

## 2024-06-30 ENCOUNTER — Encounter

## 2024-06-30 ENCOUNTER — Ambulatory Visit: Admitting: Student in an Organized Health Care Education/Training Program

## 2024-07-01 ENCOUNTER — Emergency Department

## 2024-07-01 ENCOUNTER — Other Ambulatory Visit: Payer: Self-pay

## 2024-07-01 ENCOUNTER — Emergency Department
Admission: EM | Admit: 2024-07-01 | Discharge: 2024-07-01 | Disposition: A | Discharge status: Home / Self Care | Attending: Emergency Medicine | Admitting: Emergency Medicine

## 2024-07-01 DIAGNOSIS — J45909 Unspecified asthma, uncomplicated: Secondary | ICD-10-CM | POA: Insufficient documentation

## 2024-07-01 DIAGNOSIS — Z8546 Personal history of malignant neoplasm of prostate: Secondary | ICD-10-CM | POA: Diagnosis not present

## 2024-07-01 DIAGNOSIS — M545 Low back pain, unspecified: Secondary | ICD-10-CM | POA: Diagnosis present

## 2024-07-01 DIAGNOSIS — R109 Unspecified abdominal pain: Secondary | ICD-10-CM | POA: Insufficient documentation

## 2024-07-01 DIAGNOSIS — I10 Essential (primary) hypertension: Secondary | ICD-10-CM | POA: Insufficient documentation

## 2024-07-01 LAB — CBC
HCT: 46.3 % (ref 39.0–52.0)
Hemoglobin: 14.9 g/dL (ref 13.0–17.0)
MCH: 30.7 pg (ref 26.0–34.0)
MCHC: 32.2 g/dL (ref 30.0–36.0)
MCV: 95.5 fL (ref 80.0–100.0)
Platelets: 209 K/uL (ref 150–400)
RBC: 4.85 MIL/uL (ref 4.22–5.81)
RDW: 13 % (ref 11.5–15.5)
WBC: 6.8 K/uL (ref 4.0–10.5)
nRBC: 0 % (ref 0.0–0.2)

## 2024-07-01 LAB — URINALYSIS, ROUTINE W REFLEX MICROSCOPIC
Bilirubin Urine: NEGATIVE
Glucose, UA: NEGATIVE mg/dL
Hgb urine dipstick: NEGATIVE
Ketones, ur: NEGATIVE mg/dL
Leukocytes,Ua: NEGATIVE
Nitrite: NEGATIVE
Protein, ur: NEGATIVE mg/dL
Specific Gravity, Urine: 1.014 (ref 1.005–1.030)
pH: 5 (ref 5.0–8.0)

## 2024-07-01 LAB — VITAMIN B1: Vitamin B1 (Thiamine): 18 nmol/L (ref 8–30)

## 2024-07-01 LAB — BASIC METABOLIC PANEL WITH GFR
Anion gap: 9 (ref 5–15)
BUN: 18 mg/dL (ref 6–20)
CO2: 21 mmol/L — ABNORMAL LOW (ref 22–32)
Calcium: 9.3 mg/dL (ref 8.9–10.3)
Chloride: 107 mmol/L (ref 98–111)
Creatinine, Ser: 1.17 mg/dL (ref 0.61–1.24)
GFR, Estimated: >60 mL/min (ref >60)
Glucose, Bld: 96 mg/dL (ref 70–99)
Potassium: 4.4 mmol/L (ref 3.5–5.1)
Sodium: 137 mmol/L (ref 135–145)

## 2024-07-01 LAB — IMMUNOFIXATION ELECTROPHORESIS
IgG (Immunoglobin G), Serum: 1058 mg/dL (ref 600–1640)
IgM, Serum: 82 mg/dL (ref 50–300)
Immunoglobulin A: 177 mg/dL (ref 47–310)

## 2024-07-01 LAB — TSH: TSH: 0.8 m[IU]/L (ref 0.40–4.50)

## 2024-07-01 MED ORDER — KETOROLAC TROMETHAMINE 30 MG/ML IJ SOLN
30.0000 mg | Freq: Once | INTRAMUSCULAR | Status: AC
  Administered 2024-07-01 (×2): 30 mg via INTRAMUSCULAR
  Filled 2024-07-01: qty 1

## 2024-07-01 MED ORDER — LIDOCAINE 5 % EX PTCH
1.0000 | MEDICATED_PATCH | Freq: Two times a day (BID) | CUTANEOUS | 0 refills | Status: DC
Start: 2024-07-01 — End: 2024-09-26

## 2024-07-01 MED ORDER — CYCLOBENZAPRINE HCL 5 MG PO TABS: 5.0000 mg | ORAL_TABLET | Freq: Three times a day (TID) | ORAL | 0 refills | Status: DC | PRN

## 2024-07-01 MED ORDER — LIDOCAINE 5 % EX PTCH
1.0000 | MEDICATED_PATCH | CUTANEOUS | Status: DC
  Administered 2024-07-01 (×2): 1 via TRANSDERMAL
  Filled 2024-07-01: qty 1

## 2024-07-01 NOTE — ED Triage Notes (Signed)
 Pt reports lower back pain that radiates to his abdomen and is now causing him to leak urine. Pt has prostate hx and chronic back pain but the incontinence is new

## 2024-07-01 NOTE — ED Provider Notes (Signed)
 [facility] Provider Note    Event Date/Time   First MD Initiated Contact with Patient 07/01/24 1142     (approximate)   History   Chief Complaint Flank Pain   HPI  MELROY BOUGHER is a 58 y.o. male with past medical history of hypertension, prostate cancer, asthma, and alcohol abuse who presents to the ED complaining of flank pain.  Patient reports a longstanding history of back issues that flareup from time to time, has been dealing with increasing pain in his lower back that radiates around to his left flank as well as into his left hip and thigh.  Pain has increased over the past couple of days and he reports some numbness throughout his foot as well as leaking of urine when pain gets severe with certain positional changes.  He denies any urinary retention, feels like he can empty his bladder completely, and denies any saddle anesthesia.  He reports chronic weakness in his extremities and has been following with neurology for this problem, but also feels like the weakness has slightly increased in his left leg recently.  He continues to be able to walk, but has been using a cane over the past couple of days.     Physical Exam   Triage Vital Signs: ED Triage Vitals  Encounter Vitals Group     BP 07/01/24 1117 (!) 165/95     Girls Systolic BP Percentile --      Girls Diastolic BP Percentile --      Boys Systolic BP Percentile --      Boys Diastolic BP Percentile --      Pulse Rate 07/01/24 1117 99     Resp 07/01/24 1117 18     Temp 07/01/24 1117 98.3 F (36.8 C)     Temp Source 07/01/24 1117 Oral     SpO2 07/01/24 1117 96 %     Weight 07/01/24 1124 212 lb (96.2 kg)     Height 07/01/24 1124 5' 9 (1.753 m)     Head Circumference --      Peak Flow --      Pain Score 07/01/24 1124 8     Pain Loc --      Pain Education --      Exclude from Growth Chart --     Most recent vital signs: Vitals:   07/01/24 1117 07/01/24 1524  BP: (!) 165/95 136/84   Pulse: 99 84  Resp: 18 18  Temp: 98.3 F (36.8 C) 98.1 F (36.7 C)  SpO2: 96% 100%    Constitutional: Alert and oriented. Eyes: Conjunctivae are normal. Head: Atraumatic. Nose: No congestion/rhinnorhea. Mouth/Throat: Mucous membranes are moist.  Cardiovascular: Normal rate, regular rhythm. Grossly normal heart sounds.  2+ radial and DP pulses bilaterally. Respiratory: Normal respiratory effort.  No retractions. Lungs CTAB. Gastrointestinal: Soft and nontender.  No CVA tenderness bilaterally.  No distention. Musculoskeletal: No lower extremity tenderness nor edema.  Midline lumbar spinal tenderness to palpation. Neurologic:  Normal speech and language.  4 out of 5 strength with dorsi flexion of the left foot, otherwise 5 out of 5 strength in the left lower and right lower extremity.    ED Results / Procedures / Treatments   Labs (all labs ordered are listed, but only abnormal results are displayed) Labs Reviewed  URINALYSIS, ROUTINE W REFLEX MICROSCOPIC - Abnormal; Notable for the following components:      Result Value   Color, Urine YELLOW (*)  APPearance CLEAR (*)    All other components within normal limits  BASIC METABOLIC PANEL WITH GFR - Abnormal; Notable for the following components:   CO2 21 (*)    All other components within normal limits  CBC    RADIOLOGY MRI lumbar spine reviewed and interpreted by me with no significant spinal stenosis.  PROCEDURES:  Critical Care performed: No  Procedures   MEDICATIONS ORDERED IN ED: Medications  lidocaine  (LIDODERM ) 5 % 1 patch (1 patch Transdermal Patch Applied 07/01/24 1252)  ketorolac  (TORADOL ) 30 MG/ML injection 30 mg (30 mg Intramuscular Given 07/01/24 1254)     IMPRESSION / MDM / ASSESSMENT AND PLAN / ED COURSE  I reviewed the triage vital signs and the nursing notes.                              58 y.o. male with past medical history of hypertension, prostate cancer, asthma, and alcohol abuse who  presents to the ED complaining of left lower back pain rating to his left flank and upper leg.  Patient's presentation is most consistent with acute presentation with potential threat to life or bodily function.  Differential diagnosis includes, but is not limited to, cauda equina, lumbar radiculopathy, lumbar strain, spinal stenosis.  Patient nontoxic-appearing and in no acute distress, vital signs are unremarkable.  He has strong pulses in his feet, does seem to have some mild weakness with dorsi flexion of his left foot and reports urinary incontinence.  No recent trauma with the symptoms, will further assess with MRI of his lumbar spine.  Labs without significant anemia, leukocytosis, electrolyte abnormality, or AKI.  Urinalysis with no signs of infection or hematuria to suggest kidney stone.  We will treat pain with IM Toradol  and Lidoderm  patch.  MRI of the lumbar spine shows chronic compression fracture at T12, unchanged from previous.  No significant spinal canal stenosis noted and no features concerning for cauda equina.  Suspect patient's urinary incontinence may be related to prior prostatectomy and he is appropriate for discharge home with outpatient neurosurgery follow-up.  He was counseled to return to the ED for new or worsening symptoms, patient agrees with plan.      FINAL CLINICAL IMPRESSION(S) / ED DIAGNOSES   Final diagnoses:  Acute left-sided low back pain without sciatica     Rx / DC Orders   ED Discharge Orders          Ordered    lidocaine  (LIDODERM ) 5 %  Every 12 hours        07/01/24 1603    cyclobenzaprine  (FLEXERIL ) 5 MG tablet  3 times daily PRN        07/01/24 1603             Note:  This document was prepared using Dragon voice recognition software and may include unintentional dictation errors.   Willo Dunnings, MD 07/01/24 [phone]

## 2024-07-02 ENCOUNTER — Ambulatory Visit: Payer: Self-pay | Admitting: Neurology

## 2024-07-18 NOTE — Progress Notes (Unsigned)
 Referring Physician:  Theophilus Pagan, MD [address] [place],  [place] [address]  Primary Physician:  Theophilus Pagan, MD  History of Present Illness: 07/24/2024 [name] has a history of HTN, gastric ulcer, osteoporosis, compression fracture T12, prostate CA, gout, B12 deficiency, asthma, ETOH abuse, history of chronic steroid use.   He has been sober from ETOH for about a year.   He only has 1 kidney.   Evaluated by neurology on 06/26/24 [facility]). Labs ordered along with MRI of brain and cervical spine.   Seen in ED on 07/01/24 for back and flank pain into his left foot. He has history of chronic LBP.   He has constant LBP with bilateral buttock pain and bilateral posterior leg pain to his feet with numbness, tingling, and weakness. Pain is worse with prolonged standing/sitting/walking. Hard for him to stand/cook due to pain- he has lower thoracic pain with some radiation into his ribs. Some relief with laying down.  He also has constant neck pain with constant numbness, tingling, and weakness in both arms. He has intermittent bilateral arm pain mostly to elbow, but sometimes to his wrist. Pain is worse with lifting and using his arms.   Tobacco use:  Does not smoke. He does nicotine  pouches- he does 4 a day.   Bowel/Bladder Dysfunction: had prostate removed last July- he's had some urinary leaking and urgency since that time. No issues with his bowels.   Conservative measures:  Physical therapy: did pelvic floor PT in last year.  Multimodal medical therapy including regular antiinflammatories: tylenol , flexeril  Injections:  no epidural steroid injections for his neck, he has had some for his back in the early 2000s and in 2021.    Past Surgery: no spinal surgeries  Frank Terry has dexterity issues. He thinks his balance is getting worse.   The symptoms are causing a significant impact on the patient's life.   Review of Systems:  A 10 point review of systems is  negative, except for the pertinent positives and negatives detailed in the HPI.  Past Medical History: Past Medical History:  Diagnosis Date   Alcohol use disorder    pt currently lives at the [facility]   Allergy    Anxiety    Arthritis    Asthma    well controlled   Compression fracture of spine (HCC)    Congenital absence of one kidney    pt has right kidney   Depression    Gastric ulcer    GERD (gastroesophageal reflux disease)    no meds   Gout    HTN (hypertension)    Hyponatremia    Osteoporosis    Prostate cancer (HCC)    Ulcer    Vasitis 05/2023    Past Surgical History: Past Surgical History:  Procedure Laterality Date   COLONOSCOPY WITH ESOPHAGOGASTRODUODENOSCOPY (EGD)     MASS EXCISION Left 09/19/2023   Procedure: EXCISION MASS, deep soft tissue mass back;  Surgeon: Lane Shope, MD;  Location: [facility] ORS;  Service: General;  Laterality: Left;   PELVIC LYMPH NODE DISSECTION Bilateral 06/18/2023   Procedure: PELVIC LYMPH NODE DISSECTION;  Surgeon: Penne Knee, MD;  Location: [facility] ORS;  Service: Urology;  Laterality: Bilateral;   PROSTATE BIOPSY     ROBOT ASSISTED LAPAROSCOPIC RADICAL PROSTATECTOMY N/A 06/18/2023   Procedure: XI ROBOTIC ASSISTED LAPAROSCOPIC RADICAL PROSTATECTOMY;  Surgeon: Penne Knee, MD;  Location: [facility] ORS;  Service: Urology;  Laterality: N/A;   TONSILLECTOMY  age 58    Allergies: Allergies as of 07/24/2024 - Review Complete 07/24/2024  Allergen Reaction Noted   Penicillins Rash 05/07/2022    Medications: Outpatient Encounter Medications as of 07/24/2024  Medication Sig   acetaminophen  (TYLENOL ) 500 MG tablet Take 1,000 mg by mouth every 6 (six) hours as needed.   albuterol  (VENTOLIN  HFA) 108 (90 Base) MCG/ACT inhaler Inhale 2 puffs into the lungs every 6 (six) hours as needed for wheezing or shortness of breath.   cetirizine (ZYRTEC) 10 MG tablet Take 10 mg by mouth daily. Unsure of dose   colchicine  0.6 MG tablet Take  0.6 mg by mouth as needed.   cyclobenzaprine  (FLEXERIL ) 5 MG tablet Take 1 tablet (5 mg total) by mouth 3 (three) times daily as needed.   doxycycline  (VIBRAMYCIN ) 100 MG capsule Take 1 capsule (100 mg total) by mouth 2 (two) times daily for 7 days.   fluticasone -salmeterol (ADVAIR HFA) 230-21 MCG/ACT inhaler Inhale 2 puffs into the lungs 2 (two) times daily.   irbesartan  (AVAPRO ) 150 MG tablet Take 1 tablet (150 mg total) by mouth daily.   lidocaine  (LIDODERM ) 5 % Place 1 patch onto the skin every 12 (twelve) hours. Remove & Discard patch within 12 hours or as directed by MD   nortriptyline  (PAMELOR ) 25 MG capsule Take 1 capsule (25 mg total) by mouth at bedtime.   rizatriptan  (MAXALT ) 10 MG tablet Take 1 tablet (10 mg total) by mouth as needed for migraine. May repeat in 2 hours if needed   sildenafil  (VIAGRA ) 100 MG tablet Take 1 tablet (100 mg total) by mouth daily as needed for erectile dysfunction.   No facility-administered encounter medications on file as of 07/24/2024.    Social History: Social History   Tobacco Use   Smoking status: Former    Types: Cigarettes    Start date: 1984   Smokeless tobacco: Current   Tobacco comments:    Nicotine  patches   Vaping Use   Vaping status: Never Used  Substance Use Topics   Alcohol use: Not Currently    Alcohol/week: 168.0 standard drinks of alcohol    Types: 168 Cans of beer per week    Comment: pt lives in the [facility] for alcohol addiction   Drug use: Not Currently    Family Medical History: Family History  Problem Relation Age of Onset   Hypertension Mother    Lung cancer Mother    Osteoporosis Mother    Alcohol abuse Mother    Arthritis Mother    Cancer Mother    Heart disease Mother    Hypertension Father    Cancer Father        Abdominal   Heart attack Father    Hypertension Sister    Multiple sclerosis Sister     Physical Examination: Vitals:   07/24/24 0834  BP: (!) 150/90    General: Patient is well  developed, well nourished, calm, collected, and in no apparent distress. Attention to examination is appropriate.  Respiratory: Patient is breathing without any difficulty.   NEUROLOGICAL:     Awake, Terry, oriented to person, place, and time.  Speech is clear and fluent. Fund of knowledge is appropriate.   Cranial Nerves: Pupils equal round and reactive to light.  Facial tone is symmetric.    No abnormal lesions on exposed skin.   Strength: Side Biceps Triceps Deltoid Interossei Grip Wrist Ext. Wrist Flex.  R 5 5 5 5 5 5 5   L 5 5 5  5 5 5 5    Side Iliopsoas Quads Hamstring PF DF EHL  R 5 5 5 4 5 5   L 5 5 5 4 5 5    Reflexes are 2+ and symmetric at the biceps, brachioradialis, and achilles, absent at right achilles. 3+ at the patella.      Hoffman's is absent.  Clonus is not present.    Bilateral upper and lower extremity sensation is intact to light touch.     No pain with IR/ER of both hips.   He has unsteady gait. He can heel stand on both feet. He has difficulty toe standing on both right and left.   Medical Decision Making  Imaging: Lumbar MRI dated 07/01/24:  FINDINGS:   BONES AND ALIGNMENT: Moderate chronic compression deformity of T12, unchanged from prior study. The vertebral body has lost about 50% of its height anteriorly and there is a chronic schmorl's node within the superior endplate. There is chronic downward bowing of the superior endplates of L1 and L2, as before.   SPINAL CORD: The conus medullaris terminates at L1.   SOFT TISSUES: No paraspinal mass.   L1-L2: No significant disc herniation. No spinal canal stenosis or neural foraminal narrowing.   L2-L3: Mild diffuse disc bulging with mild bilateral lateral recess stenosis.   L3-L4: Slight disc bulging without significant spinal canal or neural foraminal stenosis.   L4-L5: No significant disc herniation. No spinal canal stenosis or neural foraminal narrowing.   L5-S1: Slight disc  bulging without significant spinal canal or neural foraminal stenosis.   OTHER FINDINGS: The left kidney is not visualized.   IMPRESSION: 1. No evidence of cauda equina syndrome. 2. Moderate chronic compression deformity of T12, unchanged. 3. Chronic downward bowing of the superior endplates of L1 and L2, as before. 4. Mild diffuse disc bulging at L2-3 with mild bilateral lateral recess stenosis. 5. Slight disc bulging at L3-4 and L5-S1 without significant spinal canal or neural foraminal stenosis.   Electronically signed by: evalene coho 07/01/2024 02:59 PM EDT RP Workstation: [id number]  I have personally reviewed the images and agree with the above interpretation.  Assessment and Plan: Mr. Marsala has constant LBP with bilateral buttock pain and bilateral posterior leg pain to his feet with numbness, tingling, and weakness. Pain is worse with prolonged standing/sitting/walking.  He has known lumbar spondylosis with mild bilateral lateral recess stenosis L2-L3.   It is hard for him to stand/cook due to pain- he has lower thoracic pain with some radiation into his ribs. Some relief with laying down.  No thoracic imaging.   He also has constant neck pain with constant numbness, tingling, and weakness in both arms. He has intermittent bilateral arm pain mostly to elbow, but sometimes to his wrist.   He has dexterity and balance issues. He has unsteady gait. He is hyper reflexic at patella bilaterally. This is all suspicious for cervical stenosis. He has no current cervical imaging.   Treatment options discussed with patient and following plan made:   - Agree with MRI of cervical spine as ordered by neurology. Message to Dr. Leigh about getting this scheduled. He also ordered MRI of brain.  - MRI of thoracic spine ordered to evaluate thoracic pain and unsteady gait.  - Depending on MRI results, may consider EMG of bilateral lower extremities.  - Care with medications, he only  has 1 kidney.  - Once I have MRI results, will schedule MyChart visit to review them.   BP was slightly elevated. No  symptoms of chest pain, shortness of breath, blurry vision, or headaches. Will recheck at home and call PCP if not improved. If he develops CP, SOB, blurry vision, or headaches, then he will go to ED.     I spent a total of 45 minutes in face-to-face and non-face-to-face activities related to this patient's care today including review of outside records, review of imaging, review of symptoms, physical exam, discussion of differential diagnosis, discussion of treatment options, and documentation.   Thank you for involving me in the care of this patient.   Glade Boys PA-C Dept. of Neurosurgery

## 2024-07-21 ENCOUNTER — Telehealth: Admitting: Physician Assistant

## 2024-07-21 DIAGNOSIS — J019 Acute sinusitis, unspecified: Secondary | ICD-10-CM

## 2024-07-21 MED ORDER — DOXYCYCLINE HYCLATE 100 MG PO CAPS
100.0000 mg | ORAL_CAPSULE | Freq: Two times a day (BID) | ORAL | 0 refills | Status: AC
Start: 2024-07-21 — End: 2024-07-28

## 2024-07-21 NOTE — Patient Instructions (Signed)
 Frank Terry, thank you for joining Lynden GORMAN Snuffer, PA-C for today's virtual visit.  While this provider is not your primary care provider (PCP), if your PCP is located in our provider database this encounter information will be shared with them immediately following your visit.   A [facility] MyChart account gives you access to today's visit and all your visits, tests, and labs performed at [facility]  click here if you don't have a [facility] MyChart account or go to [email or web address]  Consent: (Patient) Frank Terry provided verbal consent for this virtual visit at the beginning of the encounter.  Current Medications:  Current Outpatient Medications:    acetaminophen  (TYLENOL ) 500 MG tablet, Take 1,000 mg by mouth every 6 (six) hours as needed., Disp: , Rfl:    albuterol  (VENTOLIN  HFA) 108 (90 Base) MCG/ACT inhaler, Inhale 2 puffs into the lungs every 6 (six) hours as needed for wheezing or shortness of breath., Disp: 8 g, Rfl: 2   cetirizine (ZYRTEC) 10 MG tablet, Take 10 mg by mouth daily. Unsure of dose, Disp: , Rfl:    colchicine  0.6 MG tablet, Take 0.6 mg by mouth as needed., Disp: , Rfl:    cyclobenzaprine  (FLEXERIL ) 5 MG tablet, Take 1 tablet (5 mg total) by mouth 3 (three) times daily as needed., Disp: 12 tablet, Rfl: 0   fluticasone -salmeterol (ADVAIR HFA) 230-21 MCG/ACT inhaler, Inhale 2 puffs into the lungs 2 (two) times daily., Disp: 1 each, Rfl: 12   irbesartan  (AVAPRO ) 150 MG tablet, Take 1 tablet (150 mg total) by mouth daily., Disp: 90 tablet, Rfl: 1   lidocaine  (LIDODERM ) 5 %, Place 1 patch onto the skin every 12 (twelve) hours. Remove & Discard patch within 12 hours or as directed by MD, Disp: 10 patch, Rfl: 0   nortriptyline  (PAMELOR ) 25 MG capsule, Take 1 capsule (25 mg total) by mouth at bedtime., Disp: 30 capsule, Rfl: 5   rizatriptan  (MAXALT ) 10 MG tablet, Take 1 tablet (10 mg total) by mouth as needed for migraine. May repeat in 2 hours if  needed, Disp: 10 tablet, Rfl: 5   sildenafil  (VIAGRA ) 100 MG tablet, Take 1 tablet (100 mg total) by mouth daily as needed for erectile dysfunction., Disp: 30 tablet, Rfl: 11   Medications ordered in this encounter:  No orders of the defined types were placed in this encounter.    *If you need refills on other medications prior to your next appointment, please contact your pharmacy*  Follow-Up: Call back or seek an in-person evaluation if the symptoms worsen or if the condition fails to improve as anticipated.  [facility] [phone]  Other Instructions Continue the steroid nasal spray and take over the counter antihistamines for congestion   If symptoms persistent for more than 48 hours or you develop a fever you can start the antibiotic medication  Follow up with your regular doctor in 1 week for reassessment and seek care sooner if your symptoms worsen or fail to improve.   If you have been instructed to have an in-person evaluation today at a local Urgent Care facility, please use the link below. It will take you to a list of all of our available [facility], including address, phone number and hours of operation. Please do not delay care.   Urgent Cares  If you or a family member do not have a primary care provider, use the link below to schedule a visit and establish care.  When you choose a [facility] primary care physician or advanced practice provider, you gain a long-term partner in health. Find a Primary Care Provider  Learn more about 's in-office and virtual care options:  - Get Care Now

## 2024-07-21 NOTE — Progress Notes (Signed)
 [name], Frank Terry are scheduled for a virtual visit with your provider today.    Just as we do with appointments in the office, we must obtain your consent to participate.  Your consent will be active for this visit and any virtual visit you may have with one of our providers in the next 365 days.    If you have a MyChart account, I can also send a copy of this consent to you electronically.  All virtual visits are billed to your insurance company just like a traditional visit in the office.  As this is a virtual visit, video technology does not allow for your provider to perform a traditional examination.  This may limit your provider's ability to fully assess your condition.  If your provider identifies any concerns that need to be evaluated in person or the need to arrange testing such as labs, EKG, etc, we will make arrangements to do so.    Although advances in technology are sophisticated, we cannot ensure that it will always work on either your end or our end.  If the connection with a video visit is poor, we may have to switch to a telephone visit.  With either a video or telephone visit, we are not always able to ensure that we have a secure connection.   I need to obtain your verbal consent now.   Are you willing to proceed with your visit today?   Frank Terry has provided verbal consent on 07/21/2024 for a virtual visit (video or telephone).   Frank Terry, [place] 07/21/2024  2:39 PM   Date:  07/21/2024   ID:  Frank Terry, DOB 02-Feb-1966, MRN [id number]  Patient Location: Home Provider Location: Home Office   Participants: Patient and Provider for Visit and Wrap up  Method of visit: Video  Location of Patient: Home Location of Provider: Home Office Consent was obtain for visit over the video. Services rendered by provider: Visit was performed via video  A video enabled telemedicine application was used and I verified that I am speaking with the correct person using two  identifiers.  PCP:  Frank Pagan, MD   Chief Complaint:  sinus symptoms  History of Present Illness:    Frank Terry is a 58 y.o. male with history as stated below. Presents video telehealth for an acute care visit  Pt reports congestion, post nasal drip, cough, headache, left ear discomfort, ongoing for 4 days. He reports sweats but no documented fevers.  He has tried sinus medication and nasal spray with mild relief.  Past Medical, Surgical, Social History, Allergies, and Medications have been Reviewed.  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  Allergies:   Penicillins   ROS See HPI for history of present illness.  Physical Exam Constitutional:      Appearance: Normal appearance.  Pulmonary:     Effort: Pulmonary effort is normal.  Neurological:     Mental Status: He is alert.             MDM: Pt with sinus sxs for 4 days. No documented fevers. Sxs appear to be viral in nature. Did give rx for abx to cover bacterial sinus infection should symptoms continue. Advised watch/wait approach. Advised continuation of steroid nasal spray and antihistamines   Tests Ordered: No orders of the defined types were placed in this encounter.   Medication Changes: Meds ordered this encounter  Medications   doxycycline  (VIBRAMYCIN ) 100 MG capsule    Sig: Take 1 capsule (100 mg total) by mouth 2  (two) times daily for 7 days.    Dispense:  14 capsule    Refill:  0     Disposition:  Follow up  Signed, Frank GORMAN Snuffer, PA-C  07/21/2024 2:39 PM

## 2024-07-23 ENCOUNTER — Ambulatory Visit: Admitting: Neurology

## 2024-07-24 ENCOUNTER — Ambulatory Visit (INDEPENDENT_AMBULATORY_CARE_PROVIDER_SITE_OTHER): Admitting: Orthopedic Surgery

## 2024-07-24 ENCOUNTER — Other Ambulatory Visit: Payer: Self-pay

## 2024-07-24 ENCOUNTER — Telehealth: Payer: Self-pay | Admitting: Neurology

## 2024-07-24 ENCOUNTER — Encounter: Payer: Self-pay | Admitting: Orthopedic Surgery

## 2024-07-24 VITALS — BP 148/90 | Ht 69.0 in | Wt 217.0 lb

## 2024-07-24 DIAGNOSIS — M542 Cervicalgia: Secondary | ICD-10-CM

## 2024-07-24 DIAGNOSIS — R292 Abnormal reflex: Secondary | ICD-10-CM

## 2024-07-24 DIAGNOSIS — R2681 Unsteadiness on feet: Secondary | ICD-10-CM | POA: Diagnosis not present

## 2024-07-24 DIAGNOSIS — M47816 Spondylosis without myelopathy or radiculopathy, lumbar region: Secondary | ICD-10-CM

## 2024-07-24 DIAGNOSIS — M48061 Spinal stenosis, lumbar region without neurogenic claudication: Secondary | ICD-10-CM | POA: Diagnosis not present

## 2024-07-24 DIAGNOSIS — M5412 Radiculopathy, cervical region: Secondary | ICD-10-CM

## 2024-07-24 DIAGNOSIS — Z8781 Personal history of (healed) traumatic fracture: Secondary | ICD-10-CM

## 2024-07-24 DIAGNOSIS — R9089 Other abnormal findings on diagnostic imaging of central nervous system: Secondary | ICD-10-CM

## 2024-07-24 DIAGNOSIS — M4726 Other spondylosis with radiculopathy, lumbar region: Secondary | ICD-10-CM

## 2024-07-24 DIAGNOSIS — R030 Elevated blood-pressure reading, without diagnosis of hypertension: Secondary | ICD-10-CM

## 2024-07-24 DIAGNOSIS — M545 Low back pain, unspecified: Secondary | ICD-10-CM

## 2024-07-24 DIAGNOSIS — M5416 Radiculopathy, lumbar region: Secondary | ICD-10-CM

## 2024-07-24 DIAGNOSIS — M546 Pain in thoracic spine: Secondary | ICD-10-CM

## 2024-07-24 NOTE — Telephone Encounter (Signed)
 Patient called again and left another message. He needs to get clarification on an MRI or CT scan. He would like to be called back today if possible

## 2024-07-24 NOTE — Telephone Encounter (Signed)
 Pt called and LM, states she was to get an MRI but wants to get more information

## 2024-07-24 NOTE — Patient Instructions (Signed)
 It was so nice to see you today. Thank you so much for coming in.    You have some wear and tear in your back and this may be causing your back pain. I am not sure what is causing your leg pain.   I want to get an MRI of your thoracic spine to look into things further. We will get this approved through your insurance and [facility] will call you to schedule the appointment. Ask about your patient responsibility. You do not need to pay this prior to getting MRI, they can bill you.   After you have the MRIs, it can take 14-28 days for me to get the results back. If I don't have them in 2 weeks, we will call to try to get the results.   Once I have the results, we will call you to schedule a follow up MyChart visit with me to review them.   I have sent Dr. Leigh a message to check on the MRI scans of the brain and cervical spine that he ordered. If you don't hear from them by Monday, please call [phone].   BP was elevated. No symptoms of chest pain, shortness of breath, blurry vision, or headaches. Will recheck at home and call PCP if not improved. If he develops CP, SOB, blurry vision, or headaches, then he will go to ED.     Please do not hesitate to call if you have any questions or concerns. You can also message me in MyChart.   Glade Boys PA-C [phone]     The physicians and staff at [facility] are committed to providing excellent care. You may receive a survey asking for feedback about your experience at our office. We value you your feedback and appreciate you taking the time to to fill it out. The [facility] leadership team is also available to discuss your experience in person, feel free to contact us  [phone].

## 2024-07-25 NOTE — Telephone Encounter (Signed)
 Called patient and told him Dr. Venus order and that they had been sent to [facility] in [place]. He understood. No further questions.

## 2024-07-30 ENCOUNTER — Ambulatory Visit
Admission: RE | Admit: 2024-07-30 | Discharge: 2024-07-30 | Disposition: A | Discharge status: Home / Self Care | Source: Ambulatory Visit | Attending: Neurology | Admitting: Neurology

## 2024-07-30 DIAGNOSIS — M47816 Spondylosis without myelopathy or radiculopathy, lumbar region: Secondary | ICD-10-CM

## 2024-07-30 DIAGNOSIS — M542 Cervicalgia: Secondary | ICD-10-CM

## 2024-07-30 DIAGNOSIS — G8929 Other chronic pain: Secondary | ICD-10-CM

## 2024-07-31 ENCOUNTER — Ambulatory Visit
Admission: RE | Admit: 2024-07-31 | Discharge: 2024-07-31 | Disposition: A | Discharge status: Home / Self Care | Source: Ambulatory Visit | Attending: Orthopedic Surgery | Admitting: Orthopedic Surgery

## 2024-07-31 ENCOUNTER — Ambulatory Visit
Admission: RE | Admit: 2024-07-31 | Discharge: 2024-07-31 | Disposition: A | Discharge status: Home / Self Care | Source: Ambulatory Visit | Attending: Neurology | Admitting: Neurology

## 2024-07-31 DIAGNOSIS — R9089 Other abnormal findings on diagnostic imaging of central nervous system: Secondary | ICD-10-CM | POA: Insufficient documentation

## 2024-07-31 DIAGNOSIS — M546 Pain in thoracic spine: Secondary | ICD-10-CM | POA: Insufficient documentation

## 2024-07-31 MED ORDER — GADOBUTROL 1 MMOL/ML IV SOLN
9.0000 mL | Freq: Once | INTRAVENOUS | Status: AC | PRN
  Administered 2024-07-31: 9 mL via INTRAVENOUS

## 2024-08-08 ENCOUNTER — Telehealth: Payer: Self-pay | Admitting: Neurology

## 2024-08-08 ENCOUNTER — Other Ambulatory Visit: Payer: Self-pay

## 2024-08-08 DIAGNOSIS — M47816 Spondylosis without myelopathy or radiculopathy, lumbar region: Secondary | ICD-10-CM

## 2024-08-08 DIAGNOSIS — G8929 Other chronic pain: Secondary | ICD-10-CM

## 2024-08-08 DIAGNOSIS — R9089 Other abnormal findings on diagnostic imaging of central nervous system: Secondary | ICD-10-CM

## 2024-08-08 DIAGNOSIS — M542 Cervicalgia: Secondary | ICD-10-CM

## 2024-08-08 NOTE — Progress Notes (Signed)
 My Chart Video Visit- Progress Note: Referring Physician:  Theophilus Pagan, MD [address] [place],  [place] [address]  Primary Physician:  Frank Pagan, MD  This visit was performed via MyChart/video.   Patient location: home Provider location: office  I spent a total of 12 minutes non-face-to-face activities for this visit on the date of this encounter including review of current clinical condition and response to treatment.    Patient has given verbal consent to this MyChart video visit and we reviewed the limitations of a MyChart video visit. Patient wishes to proceed.    Chief Complaint:  review imaging  History of Present Illness: Frank Terry is a 58 y.o. male has a history of  HTN, gastric ulcer, osteoporosis, compression fracture T12, prostate CA, gout, B12 deficiency, asthma, ETOH abuse, history of chronic steroid use.    He has been sober from ETOH for about a year.    He only has 1 kidney.   Last seen by me on 07/24/24 for neck, thoracic, and LBP with pain in arms and legs. He was having balance and dexterity issues with unsteady gait.   He is seeing neurology [facility]). MyChart visit scheduled to review his thoracic MRI.   He continues with constant neck pain with constant numbness, tingling, and weakness in both arms. He has intermittent bilateral arm pain mostly to elbow, but sometimes to his wrist. Pain is worse with lifting and using his arms.   He has intermittent mid to lower thoracic pain with some radiation into his ribs that is only with prolonged standing and walking.  Some relief with laying down.   He has intermittent LBP with constant pain in bilateral anterior thigh pain and lateral hips. Occasional pain past his knees. He continues with numbness, tingling, and weakness. Pain is worse with prolonged standing/sitting/walking. Some relief with laying down.    Tobacco use:  Does not smoke. He does nicotine  pouches- he does 4 a day.     Bowel/Bladder Dysfunction: had prostate removed last July- he's had some urinary leaking and urgency since that time. No issues with his bowels.    Conservative measures:  Physical therapy: did pelvic floor PT in last year.  Multimodal medical therapy including regular antiinflammatories: tylenol , flexeril  Injections:  no epidural steroid injections for his neck, he has had some for his back in the early 2000s and in 2021.     Past Surgery: no spinal surgeries   Frank Terry has dexterity issues. He thinks his balance is getting worse.     Exam: General: Patient is well developed, well nourished, calm, collected, and in no apparent distress. Attention to examination is appropriate.  Respiratory: Patient is breathing without any difficulty.    Awake, Terry, oriented to person, place, and time.  Speech is clear and fluent. Fund of knowledge is appropriate.     Imaging: Thoracic MRI dated  FINDINGS: Limited cervical spine imaging: Largely negative for age, concordant with cervical spine CT earlier this month.   Thoracic spine segmentation:  Normal on previous CTs.   Alignment: Chronic exaggerated lower thoracic kyphosis in conjunction with chronic lower thoracic compression fractures, stable from CT last year. No significant thoracic scoliosis or spondylolisthesis.   Vertebrae: Chronic superior endplate compression fractures of T10, T11 and T12 are stable since last year. No associated marrow edema. Normal background bone marrow signal. Small chronic sclerotic focus superimposed in the right T10 vertebral body also stable by CT since last year and compatible with  benign etiology, probably bone island. Stable, maintained thoracic vertebral height elsewhere. No marrow edema or evidence of acute osseous abnormality.   Cord: Normal. Capacious thoracic spinal canal at most levels, see details below. Conus medullaris mostly visible at T12-L1 and appears to be normal.    Paraspinal and other soft tissues: Stable, negative.   Disc levels:   No age advanced thoracic spine degeneration   T1-T2 through T7-T8.   T8-T9: Circumferential disc bulge with mild to moderate facet and ligament flavum hypertrophy. No spinal stenosis. Mild left and moderate to severe right T8 neural foraminal stenosis.   T9-T10: Negative disc. Moderate to severe facet hypertrophy. Mild left and moderate to severe right T9 foraminal stenosis.   T10-T11: Minor disc bulging. Moderate facet and ligament flavum hypertrophy. Mild T10 foraminal stenosis.   T11-T12: Disc osteophyte complex in part related to chronic retropulsion of the posterosuperior T12 endplate, stable from last year. Moderate posterior element hypertrophy. Borderline to mild spinal stenosis. No cord mass effect. Mild bilateral T11 foraminal stenosis.   IMPRESSION: 1. No acute or suspicious osseous abnormality in the thoracic spine. Chronic T10, T11, and T12 compression fractures are stable from last year. 2. Mild for age thoracic spine degeneration above T8-T9. Mild chronic T12 retropulsion contributes to borderline or mild spinal stenosis at T11-T12. Lower thoracic facet arthropathy results in moderate to severe neural foraminal stenosis at the right T8 and T9 nerve levels.     Electronically Signed   By: VEAR Hurst M.D.   On: 08/08/2024 10:57   CT of cervical spine dated 07/30/24:  FINDINGS: Alignment: Facet joints are aligned without dislocation or traumatic listhesis. Dens and lateral masses are aligned. Straightening of the cervical lordosis, which may be positional.   Skull base and vertebrae: No acute fracture. No primary bone lesion or focal pathologic process.   Soft tissues and spinal canal: No prevertebral fluid or swelling. No visible canal hematoma.   Disc levels: Multilevel mild disc space narrowing and endplate spurring, most notably at C3-4, C5-6, and C6-7. Mild  multilevel degenerative facet arthropathy. Foraminal narrowing appears most notably at C5-6 on the right. No evidence of high-grade canal stenosis by CT.   Upper chest: Included lung apices are clear.   Other: Bilateral carotid atherosclerosis.   IMPRESSION: 1. No acute fracture or traumatic listhesis of the cervical spine. 2. Mild multilevel degenerative changes of the cervical spine.     Electronically Signed   By: Mabel Converse D.O.   On: 08/06/2024 15:20   I have personally reviewed the images and agree with the above interpretation.  Assessment and Plan: Mr. Feick continues with constant neck pain with constant numbness, tingling, and weakness in both arms. He has intermittent bilateral arm pain mostly to elbow, but sometimes to his wrist.   He has known cervical spondylosis and DDD per above cervical CT.   He has dexterity and balance issues. He has unsteady gait. He is hyper reflexic at patella bilaterally. No high grade canal stenosis seen on CT.   He has intermittent mid to lower thoracic pain with some radiation into his ribs that is only with prolonged standing and walking.  Some relief with laying down.  He has mild central and bilateral foraminal stenosis T11-T12, mild left and moderate/severe right foraminal stenosis T8-T9, mild bilateral foraminal stenosis T10-T11, and chronic compression fractures at T10-T12.    He has intermittent LBP with constant pain in bilateral anterior thigh pain and lateral hips. Occasional pain past his knees. He  continues with numbness, tingling, and weakness.    He has known lumbar spondylosis with mild bilateral lateral recess stenosis L2-L3.    Treatment options discussed with patient and following plan made:    - Agree with MRI of cervical spine as ordered by neurology. This is scheduled for tomorrow.  - Will regroup with him once above cervical MRI is done. If this does not show significant central stenosis, consider referral  to pain management for possible thoracic/lumbar injections.  - May also consider EMG of lower extremities.  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  - Once I see his cervical MRI results, will schedule MyChart or in person visit to review them.   Glade Boys PA-C Neurosurgery

## 2024-08-08 NOTE — Telephone Encounter (Signed)
 Called patient about MRI results. MRI brain was stable without etiology to explain headaches or upper motor neuron signs. MRI thoracic spine showed known compression fractures and likely radiculopathy, but without definitive cord compression. Unfortunately, MRI cervical spine was apparently switched to CT cervical spine by our office. While CT looked out, this is not good enough to rule out myelopathy. I apologized to patient. He accepted and understood and agreed to get the MRI cervical spine. I will order this today and attempt to get ASAP since patient has been waiting on results.  Regarding his headaches, they have improved greatly, now only having 1 per week that responds to rizatriptan . He has had more vertigo. He is to see ENT next month.  All questions were answered.  Venetia Potters, MD [facility] Neurology

## 2024-08-11 ENCOUNTER — Encounter: Payer: Self-pay | Admitting: Orthopedic Surgery

## 2024-08-11 ENCOUNTER — Telehealth (INDEPENDENT_AMBULATORY_CARE_PROVIDER_SITE_OTHER): Admitting: Orthopedic Surgery

## 2024-08-11 ENCOUNTER — Telehealth: Payer: Self-pay | Admitting: Orthopedic Surgery

## 2024-08-11 DIAGNOSIS — Z8781 Personal history of (healed) traumatic fracture: Secondary | ICD-10-CM

## 2024-08-11 DIAGNOSIS — M47812 Spondylosis without myelopathy or radiculopathy, cervical region: Secondary | ICD-10-CM | POA: Diagnosis not present

## 2024-08-11 DIAGNOSIS — R292 Abnormal reflex: Secondary | ICD-10-CM

## 2024-08-11 DIAGNOSIS — M5416 Radiculopathy, lumbar region: Secondary | ICD-10-CM

## 2024-08-11 DIAGNOSIS — M4854XS Collapsed vertebra, not elsewhere classified, thoracic region, sequela of fracture: Secondary | ICD-10-CM

## 2024-08-11 DIAGNOSIS — R2681 Unsteadiness on feet: Secondary | ICD-10-CM

## 2024-08-11 DIAGNOSIS — M5412 Radiculopathy, cervical region: Secondary | ICD-10-CM

## 2024-08-11 DIAGNOSIS — M47814 Spondylosis without myelopathy or radiculopathy, thoracic region: Secondary | ICD-10-CM

## 2024-08-11 DIAGNOSIS — M47816 Spondylosis without myelopathy or radiculopathy, lumbar region: Secondary | ICD-10-CM | POA: Diagnosis not present

## 2024-08-11 DIAGNOSIS — M48061 Spinal stenosis, lumbar region without neurogenic claudication: Secondary | ICD-10-CM

## 2024-08-11 DIAGNOSIS — M4804 Spinal stenosis, thoracic region: Secondary | ICD-10-CM

## 2024-08-11 NOTE — Telephone Encounter (Signed)
 Error

## 2024-08-11 NOTE — Telephone Encounter (Signed)
 error

## 2024-08-12 ENCOUNTER — Ambulatory Visit: Payer: Self-pay | Admitting: Neurology

## 2024-08-12 ENCOUNTER — Ambulatory Visit
Admission: RE | Admit: 2024-08-12 | Discharge: 2024-08-12 | Disposition: A | Discharge status: Home / Self Care | Source: Ambulatory Visit | Attending: Neurology

## 2024-08-12 DIAGNOSIS — M47816 Spondylosis without myelopathy or radiculopathy, lumbar region: Secondary | ICD-10-CM

## 2024-08-12 DIAGNOSIS — R9089 Other abnormal findings on diagnostic imaging of central nervous system: Secondary | ICD-10-CM

## 2024-08-12 DIAGNOSIS — M542 Cervicalgia: Secondary | ICD-10-CM

## 2024-08-12 DIAGNOSIS — G8929 Other chronic pain: Secondary | ICD-10-CM

## 2024-08-13 ENCOUNTER — Other Ambulatory Visit

## 2024-08-15 ENCOUNTER — Emergency Department

## 2024-08-15 ENCOUNTER — Telehealth: Payer: Self-pay | Admitting: Orthopedic Surgery

## 2024-08-15 ENCOUNTER — Other Ambulatory Visit: Payer: Self-pay

## 2024-08-15 ENCOUNTER — Emergency Department
Admission: EM | Admit: 2024-08-15 | Discharge: 2024-08-16 | Disposition: A | Discharge status: Home / Self Care | Attending: Emergency Medicine | Admitting: Emergency Medicine

## 2024-08-15 DIAGNOSIS — J45909 Unspecified asthma, uncomplicated: Secondary | ICD-10-CM | POA: Insufficient documentation

## 2024-08-15 DIAGNOSIS — R600 Localized edema: Secondary | ICD-10-CM | POA: Diagnosis not present

## 2024-08-15 DIAGNOSIS — R911 Solitary pulmonary nodule: Secondary | ICD-10-CM | POA: Insufficient documentation

## 2024-08-15 DIAGNOSIS — I10 Essential (primary) hypertension: Secondary | ICD-10-CM | POA: Diagnosis not present

## 2024-08-15 DIAGNOSIS — M7989 Other specified soft tissue disorders: Secondary | ICD-10-CM | POA: Diagnosis present

## 2024-08-15 DIAGNOSIS — R079 Chest pain, unspecified: Secondary | ICD-10-CM

## 2024-08-15 LAB — CBC
HCT: 43.6 % (ref 39.0–52.0)
Hemoglobin: 13.9 g/dL (ref 13.0–17.0)
MCH: 29.2 pg (ref 26.0–34.0)
MCHC: 31.9 g/dL (ref 30.0–36.0)
MCV: 91.6 fL (ref 80.0–100.0)
Platelets: 301 K/uL (ref 150–400)
RBC: 4.76 MIL/uL (ref 4.22–5.81)
RDW: 12.7 % (ref 11.5–15.5)
WBC: 7.9 K/uL (ref 4.0–10.5)
nRBC: 0 % (ref 0.0–0.2)

## 2024-08-15 LAB — COMPREHENSIVE METABOLIC PANEL WITH GFR
ALT: 13 U/L (ref 0–44)
AST: 15 U/L (ref 15–41)
Albumin: 4.2 g/dL (ref 3.5–5.0)
Alkaline Phosphatase: 77 U/L (ref 38–126)
Anion gap: 10 (ref 5–15)
BUN: 20 mg/dL (ref 6–20)
CO2: 21 mmol/L — ABNORMAL LOW (ref 22–32)
Calcium: 9.5 mg/dL (ref 8.9–10.3)
Chloride: 104 mmol/L (ref 98–111)
Creatinine, Ser: 1.13 mg/dL (ref 0.61–1.24)
GFR, Estimated: >60 mL/min (ref >60)
Glucose, Bld: 84 mg/dL (ref 70–99)
Potassium: 3.8 mmol/L (ref 3.5–5.1)
Sodium: 135 mmol/L (ref 135–145)
Total Bilirubin: 0.4 mg/dL (ref 0.0–1.2)
Total Protein: 8.4 g/dL — ABNORMAL HIGH (ref 6.5–8.1)

## 2024-08-15 NOTE — Telephone Encounter (Signed)
 Pt has gotten his mri results in, he is wanting to see if you had a chance to review these and if so he would like to schedule that appointment.

## 2024-08-15 NOTE — ED Notes (Signed)
 Patient states mid sternal chest pain starting now. Pain 5/10 with no radiation. EKG performed. MD made aware.

## 2024-08-15 NOTE — ED Triage Notes (Signed)
 Pt presents to ER from home with complaints of right ankle and lower leg swelling. Pt reports he noticed swelling to right lower leg this morning but it has progressed during the day. Pt also reports he is susceptible to blood clots. Pt talks in complete sentences no respiratory distress noted

## 2024-08-16 ENCOUNTER — Emergency Department

## 2024-08-16 LAB — TROPONIN I (HIGH SENSITIVITY)
Troponin I (High Sensitivity): 3 ng/L (ref <18)
Troponin I (High Sensitivity): 3 ng/L (ref <18)

## 2024-08-16 LAB — BRAIN NATRIURETIC PEPTIDE: B Natriuretic Peptide: 141.2 pg/mL — ABNORMAL HIGH (ref 0.0–100.0)

## 2024-08-16 MED ORDER — ACETAMINOPHEN 500 MG PO TABS
1000.0000 mg | ORAL_TABLET | Freq: Once | ORAL | Status: AC
  Administered 2024-08-16: 1000 mg via ORAL
  Filled 2024-08-16: qty 2

## 2024-08-16 MED ORDER — IOHEXOL 350 MG/ML SOLN
100.0000 mL | Freq: Once | INTRAVENOUS | Status: AC | PRN
  Administered 2024-08-16: 100 mL via INTRAVENOUS

## 2024-08-16 NOTE — ED Provider Notes (Signed)
 [facility] Provider Note    Event Date/Time   First MD Initiated Contact with Patient 08/15/24 2355     (approximate)   History   Leg Swelling   HPI  Frank Terry is a 58 y.o. male with history of hypertension, alcohol use disorder, peptic ulcer disease, asthma, presenting with leg swelling.  They noticed right ankle and lower leg swelling this morning, says he has progressed during the day.  States that he does not have any shortness of breath but did have some chest pain in the waiting room with some lightheadedness.  Symptoms have resolved now.  He denies new focal weakness or numbness, denies any trauma or falls.  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  On independent chart review, he had a CT PE study done in May that was negative for PE.  He had an echo done in April 2025 that showed EF of 55 to 60%, normal diastolic parameters.  Stress test was done that was normal.     Physical Exam   Triage Vital Signs: ED Triage Vitals  Encounter Vitals Group     BP 08/15/24 1912 (!) 159/105     Girls Systolic BP Percentile --      Girls Diastolic BP Percentile --      Boys Systolic BP Percentile --      Boys Diastolic BP Percentile --      Pulse Rate 08/15/24 1912 96     Resp 08/15/24 1912 18     Temp 08/15/24 1912 98.5 F (36.9 C)     Temp Source 08/15/24 1912 Oral     SpO2 08/15/24 1912 100 %     Weight 08/15/24 1912 208 lb (94.3 kg)     Height 08/15/24 1912 5' 9 (1.753 m)     Head Circumference --      Peak Flow --      Pain Score 08/15/24 1916 5     Pain Loc --      Pain Education --      Exclude from Growth Chart --     Most recent vital signs: Vitals:   08/16/24 0330 08/16/24 0400  BP: 128/88 (!) 137/105  Pulse: 87 86  Resp: 19   Temp: 98.6 F (37 C)   SpO2: 97% 98%     General: Awake, no distress.  CV:  Good peripheral perfusion.  Resp:  Normal effort.  No tachypnea or respiratory  distress Abd:  No distention.  Soft nontender Other:  He does have mild edema to his right ankle, no overlying lesions or erythema, no ecchymoses, he is palpable DP pulses, no focal weakness or numbness to the lower extremities.   ED Results / Procedures / Treatments   Labs (all labs ordered are listed, but only abnormal results are displayed) Labs Reviewed  COMPREHENSIVE METABOLIC PANEL WITH GFR - Abnormal; Notable for the following components:      Result Value   CO2 21 (*)    Total Protein 8.4 (*)    All other components within normal limits  BRAIN NATRIURETIC PEPTIDE - Abnormal; Notable for the following components:   B Natriuretic Peptide 141.2 (*)    All other components within normal limits  CBC  TROPONIN I (HIGH SENSITIVITY)  TROPONIN I (HIGH SENSITIVITY)     EKG  EKG shows, sinus rhythm, rate 89, normal QS, normal QTc, no obvious ischemic ST elevation, T wave flattening in  V2, mild T wave inversion to aVL, T wave change to V2 is new compared to prior   RADIOLOGY On my independent interpretation, ultrasound without obvious DVT   PROCEDURES:  Critical Care performed: No  Procedures   MEDICATIONS ORDERED IN ED: Medications  acetaminophen  (TYLENOL ) tablet 1,000 mg (1,000 mg Oral Given 08/16/24 0221)  iohexol  (OMNIPAQUE ) 350 MG/ML injection 100 mL (100 mLs Intravenous Contrast Given 08/16/24 0213)     IMPRESSION / MDM / ASSESSMENT AND PLAN / ED COURSE  I reviewed the triage vital signs and the nursing notes.                              Differential diagnosis includes, but is not limited to, angina, ACS, electrolyte derangements, PE, DVT.  Lymphedema.  Labs, EKG, troponin, CT PE study, DVT ultrasound.  Patient's presentation is most consistent with acute presentation with potential threat to life or bodily function.  Independent interpretation of labs and imaging below.  CT imaging as well as ultrasound without evidence of PE or DVT, he has a pulmonary nodule  that was noted in prior imaging.  Labs are reassuring.  Considered but no indication for inpatient admission at this time, he safe for outpatient management.  Discussed imaging and lab results including incidental findings, instructed to follow-up with primary care as well as cardiology for further management of his symptoms as well as incidental findings.  Discharged with strict precautions.  Referrals were made to cardiology and primary care.  The patient is on the cardiac monitor to evaluate for evidence of arrhythmia and/or significant heart rate changes.   Clinical Course as of 08/16/24 0443  Sat Aug 16, 2024  0002 US  Venous Img Lower Unilateral Right (DVT) IMPRESSION: No evidence of acute deep venous thrombosis in the visualized lower extremity veins.   [TT]  0128 Independent review of labs, electrolytes not severely deranged, LFTs are normal, no leukocytosis, troponin is negative, BNP is mildly elevated. [TT]  0307 CT Angio Chest PE W/Cm &/Or Wo Cm IMPRESSION: 1. No pulmonary embolism. 2. Stable 6 mm ground-glass nodule in the posterior right upper lobe is unchanged from Mar 20 2024. Given peristence, according to the Fleischner Society pulmonary nodule recommendations, recommend CT every 2 years until 5 years of stability is documented.   [TT]  0417 Troponin I (High Sensitivity) Troponin x 2 is negative. [TT]    Clinical Course User Index [TT] Waymond, Lorelle Cummins, MD     FINAL CLINICAL IMPRESSION(S) / ED DIAGNOSES   Final diagnoses:  Leg edema  Chest pain, unspecified type  Pulmonary nodule     Rx / DC Orders   ED Discharge Orders          Ordered    Ambulatory referral to Cardiology       Comments: If you have not heard from the Cardiology office within the next 72 hours please call [phone].   08/16/24 0336    Ambulatory Referral to Primary Care (Establish Care)        08/16/24 0336             Note:  This document was prepared using Dragon voice  recognition software and may include unintentional dictation errors.    [place]mond Lorelle Cummins, MD 08/16/24 [phone]

## 2024-08-16 NOTE — Discharge Instructions (Signed)
 Please make sure to keep your legs elevated when you are sitting or standing.  I put in a primary care doctor referral as well as cardiology referral for you.  Please make sure to follow-up.  Your CT imaging also incidentally noted a pulmonary nodule, this was seen on prior imaging.  Please make sure to follow-up with primary care so that they can follow up on these incidental findings.

## 2024-08-20 NOTE — Progress Notes (Unsigned)
 My Chart Video Visit- Progress Note: Referring Physician:  Theophilus Pagan, MD [address] [place],  [place] [address]  Primary Physician:  Theophilus Pagan, MD  This visit was performed via MyChart/video.   Patient location: home Provider location: working from home  I spent a total of 20 minutes non-face-to-face activities for this visit on the date of this encounter including review of current clinical condition and response to treatment.    Patient has given verbal consent to this MyChart video visit and we reviewed the limitations of a MyChart video visit. Patient wishes to proceed.    Chief Complaint:  review imaging  History of Present Illness: Frank Terry is a 58 y.o. male has a history of  HTN, gastric ulcer, osteoporosis, compression fracture T12, prostate CA, gout, B12 deficiency, asthma, ETOH abuse, history of chronic steroid use.    He has been sober from ETOH for about a year.    He only has 1 kidney.   Last seen by me for video visit on 08/11/24 to review his imaging. He was having neck, thoracic, and LBP with pain in arms and legs. He was having balance and dexterity issues with unsteady gait.   He is seeing neurology [facility]). MyChart visit scheduled to review his cervical MRI.  Seen in ED recently for pain and swelling in right foot and ankle.   Primary complaint today is more constant mid to lower thoracic pain with some radiation into his ribs. This is worse with prolonged standing and walking.  Some relief with laying down.   He still has constant neck pain with constant numbness, tingling, and weakness in both arms. He has intermittent bilateral arm pain mostly to elbow, but sometimes to his wrist. Pain is worse with lifting and using his arms.   He also has intermittent LBP with constant pain in bilateral anterior thigh pain and lateral hips. Occasional pain past his knees. He continues with numbness, tingling, and weakness. Pain is worse with  prolonged standing/sitting/walking. Some relief with laying down.  He continues with balance/dexterity issues and an unsteady gait.    Tobacco use:  Does not smoke. He does nicotine  pouches- he does 4 a day.    Bowel/Bladder Dysfunction: had prostate removed last July- he's had some urinary leaking and urgency since that time. No issues with his bowels.    Conservative measures:  Physical therapy: did pelvic floor PT in last year.  Multimodal medical therapy including regular antiinflammatories: tylenol , flexeril  Injections:  no epidural steroid injections for his neck, he has had some for his back in the early 2000s and in 2021.     Past Surgery: no spinal surgeries   [name] has dexterity issues. He thinks his balance is getting worse.     Exam: General: Patient is well developed, well nourished, calm, collected, and in no apparent distress. Attention to examination is appropriate.  Respiratory: Patient is breathing without any difficulty.    Awake, alert, oriented to person, place, and time.  Speech is clear and fluent. Fund of knowledge is appropriate.     Imaging: Cervical MRI dated 08/12/24:  FINDINGS:   BONES AND ALIGNMENT: Normal alignment. Normal vertebral body heights. Bone marrow signal is unremarkable.   SPINAL CORD: Normal spinal cord size. No abnormal spinal cord signal. No apparent impingement of the spinal cord.   SOFT TISSUES: No paraspinal mass.   C2-C3: No significant disc herniation. No spinal canal stenosis or neural foraminal narrowing.   C3-C4:  Minimal disc bulging and mild bilateral uncovertebral joint hypertrophy with mild central spinal canal stenosis. The neural foramina are patent.   C4-C5: No significant disc herniation. No spinal canal stenosis or neural foraminal narrowing.   C5-C6: Broad-based disc bulge and endplate ridging with mild central spinal canal stenosis and mild right neural foraminal stenosis.   C6-C7: Diffuse  disc bulging and left-sided uncovertebral joint hypertrophy, with mild left spinal canal and left neural foraminal stenosis.   C7-T1: No significant disc herniation. No spinal canal stenosis or neural foraminal narrowing.   IMPRESSION: 1. Mild central spinal canal stenosis at C3-4, C5-6, and left C6-7. 2. Mild right neural foraminal stenosis at C5-6 and mild left neural foraminal stenosis at C6-7. 3. No apparent impingement of the spinal cord or nerve roots.   Electronically signed by: Evalene Coho MD 08/12/2024 08:16 AM EDT RP Workstation: [id number]      I have personally reviewed the images and agree with the above interpretation.  Assessment and Plan: Mr. Casamento's primary complaint today is more constant mid to lower thoracic pain with some radiation into his ribs.   He has mild central and bilateral foraminal stenosis T11-T12, mild left and moderate/severe right foraminal stenosis T8-T9, mild bilateral foraminal stenosis T10-T11, and chronic compression fractures at T10-T12.   He still has constant neck pain with constant numbness, tingling, and weakness in both arms. He has intermittent bilateral arm pain mostly to elbow, but sometimes to his wrist.   He has mild central stenosis C3-C7 with mild right foraminal stenosis C5-C6 and mild left foraminal stenosis C6-C7. No significant cord compression.   He also has intermittent LBP with constant pain in bilateral anterior thigh pain and lateral hips. Occasional pain past his knees. He continues with numbness, tingling, and weakness.   He has known lumbar spondylosis with mild bilateral lateral recess stenosis L2-L3.   He continues with balance/dexterity issues and an unsteady gait.   Treatment options discussed with patient and following plan made:   - Do not see spinal cause of balance/dexterity issues and unsteady gait. Dr. Leigh has referred him to ENT.  - PT for cervical, thoracic, and lumbar spine. Orders to [facility].  -  Referral to pain management (Lateef) to consider injections.  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  - Follow up with Dr. Leigh as scheduled.  - May consider EMG of upper/lower extremities at some point, but will hold off for now.  - Follow up with me in 6-8 weeks and prn.   Glade Boys PA-C Neurosurgery

## 2024-08-22 ENCOUNTER — Encounter: Payer: Self-pay | Admitting: Orthopedic Surgery

## 2024-08-22 ENCOUNTER — Telehealth (INDEPENDENT_AMBULATORY_CARE_PROVIDER_SITE_OTHER): Admitting: Orthopedic Surgery

## 2024-08-22 DIAGNOSIS — R2689 Other abnormalities of gait and mobility: Secondary | ICD-10-CM

## 2024-08-22 DIAGNOSIS — M47814 Spondylosis without myelopathy or radiculopathy, thoracic region: Secondary | ICD-10-CM

## 2024-08-22 DIAGNOSIS — M47816 Spondylosis without myelopathy or radiculopathy, lumbar region: Secondary | ICD-10-CM

## 2024-08-22 DIAGNOSIS — M4804 Spinal stenosis, thoracic region: Secondary | ICD-10-CM

## 2024-08-22 DIAGNOSIS — M4722 Other spondylosis with radiculopathy, cervical region: Secondary | ICD-10-CM | POA: Diagnosis not present

## 2024-08-22 DIAGNOSIS — M4726 Other spondylosis with radiculopathy, lumbar region: Secondary | ICD-10-CM | POA: Diagnosis not present

## 2024-08-22 DIAGNOSIS — M5412 Radiculopathy, cervical region: Secondary | ICD-10-CM

## 2024-08-22 DIAGNOSIS — M4802 Spinal stenosis, cervical region: Secondary | ICD-10-CM

## 2024-08-22 DIAGNOSIS — M47812 Spondylosis without myelopathy or radiculopathy, cervical region: Secondary | ICD-10-CM

## 2024-08-22 DIAGNOSIS — M5416 Radiculopathy, lumbar region: Secondary | ICD-10-CM

## 2024-08-26 ENCOUNTER — Ambulatory Visit (INDEPENDENT_AMBULATORY_CARE_PROVIDER_SITE_OTHER): Admitting: Audiology

## 2024-08-26 ENCOUNTER — Institutional Professional Consult (permissible substitution) (INDEPENDENT_AMBULATORY_CARE_PROVIDER_SITE_OTHER)

## 2024-09-18 NOTE — Progress Notes (Signed)
 I saw JJESUS DINGLEY in neurology clinic on 06/26/24 in follow up for neck and back pain, weakness, and headaches.  HPI: Frank Terry is a 58 y.o. year old male with a history of HTN, asthma, lumbar spondylosis, prostate cancer, B12 deficiency, gastric ulcer, EtOH abuse, substance abuse who we last saw on 06/26/24.  To briefly review: 06/26/24: Patient started having low back and disc issues in the late 90s. He was using EtOH and drugs for pain in addition to getting pain meds from his doctors. He would also take steroids. This continued throughout the years. In 2020 he had to stop working due to chronic pain. He was having pain in the back, hips, and had pain in his thighs. He went to the spine center at [facility]. He was found to have compression fractures in his low back. Patient denies trauma, but wonders if it was due to chronic steroids. He was told medications were his only option.   In 02/2023, patient got sober again. He was homeless at points as well. He started becoming more concerned about his health. He went to ortho who did another MRI. He was told there was a possibility of surgery to help. In 04/2023, he was diagnosed with stage 4 prostate cancer (spread to lymph nodes). He had surgery but did not require chemo or radiation. He states he is currently cancer free. Due to the prostate cancer, he did not follow up with ortho as planned. He does not currently have a pain management provide. He is managing currently with tylenol  and ibuprofen  and finds this to be okay for now. He takes 1000 mg of tylenol  and at least 800 mg of ibuprofen  daily. He is not on any other pain medication.   Since, he has a lot of fatigue. When he walks, his leg will start to shake. He finds walking any distance very hard. He has had 2 falls in the last 4 months. His legs were shaking, he felt weak all over and short of breath and fell over. Another time, his right leg gave out. When he sits down, the shakiness will stop, but  he still has pain and feels dizzy and off. He has been checked by pulmonology and cardiology without a problem found per patient. When at the store, leaning on a cart will help symptoms. He will also notice shaking of the arms when trying to hold things. His pain (neck to low back and shooting pains in legs and arms) has gotten worse progressively as well.    He also gets headaches. These started after the prostate surgery in 2024. He does not know if he was having headaches prior to this because of his addiction and self medicating. He pain typically starts in the neck and radiates into his entire head. When most severe, he will have photophobia and phonophobia. He rates the headache pain from 5-7/10. He has an average of 3-4 headaches per week. They can last a few hours to a whole day. He has been given sumatriptan  in the past which would knock down the pain a little. PCP prescribed Nurtec but this was never approved by insurance.   He has been on gabapentin  in the past (worked some but felt he abused it in the past). He was given amitriptyline years ago for depression. He thinks he tolerated this okay. He is not currently on medication for depression.   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 Patient was seen by audiology on 04/30/24 who noted patient had mild high-frequency sensorineural hearing loss bilaterally. Audiology recommended ENT evaluation and use of noise maskers to distract from tinnitus at night. He has not looked into this yet.  Most recent Assessment and Plan (06/26/24): JASN XIA is a 58 y.o. male who presents for evaluation of chronic neck and back pain, weakness of arms and legs, and headaches. He has a relevant medical history of HTN, asthma, lumbar spondylosis, prostate cancer, B12 deficiency, gastric ulcer, EtOH abuse, substance abuse. His neurological examination is pertinent for asymmetric  sensation in patchy distribution, hyperreflexia at patella, and absent right ankle jerk reflex. This is a complex case with multiple potential etiologies. He has known compression fractures in the low back that may be causing pain. A lot of his leg symptoms sound consistent with neurogenic claudication. Per patient, surgery was discussed years ago prior to prostate cancer diagnosis which prevented further follow up. He is interested in reconnecting with someone in spine, but closer to home [facility] area). He also has neck pain and symptoms in his arms, so a cervical spine issue is also possible, and given his hyperreflexia at the patella, even cervical stenosis.    His headaches could be due to cervigalgia, but also have features of migraines. He is currently having 3-4 headaches per week, so a preventative medication that could also treat chronic pain is reasonable to try. He tolerated a TCA in the past, so nortriptyline  is likely a good option. MRI brain showed a possible vascular malformation, which is likely not the etiology of headaches, but a repeat MRI was recommended, so I will order this today with MRI of cervical spine to evaluate the neck further.   Patient was also seen by audiology who recommended ENT referral for hearing loss and tinnitus, so I will also place this referral.   PLAN: -Blood work: B1, TSH, IFE -Repeat MRI brain w/wo contrast as recommended by radiology -MRI cervical spine wo contrast -Referral to spine surgery -Referral to ENT as recommended by audiology   -For headaches: Headache prevention:  Start nortriptyline  25 mg at bedtime Headache rescue:  Start rizatriptan  10 mg as needed at headache, can repeat in 2 hours if needed Limit use of pain relievers to no more than 2 days out of week to prevent risk of rebound or medication-overuse headache. Keep headache diary Will consider PT for cervicalgia pending results of MRI cervical spine  Since their last visit: Labs  were normal. MRI brain was stable without clear pathology. MRI thoracic spine showed known compression fractures and likely radiculopathy, but no definitive cord compression. While MRI cervical spine was order, this was somehow changed to CT cervical spine that looked okay. This was not sufficient though, so patient agreed to get MRI cervical spine. MRI cervical spine did not show any significant stenosis. While patient was referred to NSGY, there was no clear spinal cause of symptoms. Patient was referred to PT. He was also referred to Dr. Marcelino in pain management (seen on 09/23/24). There are injections planned.  He denies any recent falls.  Headaches had improved when I spoke to the patient on 08/08/24, having only 1 headache per week that responded to rizatriptan . He continues to have 1 headache per week on average and rizatriptan  will get rid of the headache. He continues to take nortriptyline  25 mg at bedtime. He has not noticed any side effects.  Patient is seeing ENT later this  month for intermittent dizziness and tinnitus.    MEDICATIONS:  Outpatient Encounter Medications as of 09/26/2024  Medication Sig   acetaminophen  (TYLENOL ) 500 MG tablet Take 1,000 mg by mouth every 6 (six) hours as needed.   albuterol  (VENTOLIN  HFA) 108 (90 Base) MCG/ACT inhaler Inhale 2 puffs into the lungs every 6 (six) hours as needed for wheezing or shortness of breath.   cetirizine (ZYRTEC) 10 MG tablet Take 10 mg by mouth daily. Unsure of dose   colchicine  0.6 MG tablet Take 0.6 mg by mouth as needed.   fluticasone -salmeterol (ADVAIR HFA) 230-21 MCG/ACT inhaler Inhale 2 puffs into the lungs 2 (two) times daily.   irbesartan  (AVAPRO ) 150 MG tablet Take 1 tablet (150 mg total) by mouth daily.   nortriptyline  (PAMELOR ) 25 MG capsule Take 1 capsule (25 mg total) by mouth at bedtime.   rizatriptan  (MAXALT ) 10 MG tablet Take 1 tablet (10 mg total) by mouth as needed for migraine. May repeat in 2 hours if needed    cyclobenzaprine  (FLEXERIL ) 5 MG tablet Take 1 tablet (5 mg total) by mouth 3 (three) times daily as needed.   lidocaine  (LIDODERM ) 5 % Place 1 patch onto the skin every 12 (twelve) hours. Remove & Discard patch within 12 hours or as directed by MD   sildenafil  (VIAGRA ) 100 MG tablet Take 1 tablet (100 mg total) by mouth daily as needed for erectile dysfunction. (Patient not taking: Reported on 09/26/2024)   No facility-administered encounter medications on file as of 09/26/2024.    PAST MEDICAL HISTORY: Past Medical History:  Diagnosis Date   Alcohol use disorder    pt currently lives at the [facility]   Allergy    Anxiety    Arthritis    Asthma    well controlled   Compression fracture of spine (HCC)    Congenital absence of one kidney    pt has right kidney   Depression    Gastric ulcer    GERD (gastroesophageal reflux disease)    no meds   Gout    HTN (hypertension)    Hyponatremia    Osteoporosis    Prostate cancer (HCC)    Ulcer    Vasitis 05/2023    PAST SURGICAL HISTORY: Past Surgical History:  Procedure Laterality Date   COLONOSCOPY WITH ESOPHAGOGASTRODUODENOSCOPY (EGD)     MASS EXCISION Left 09/19/2023   Procedure: EXCISION MASS, deep soft tissue mass back;  Surgeon: Lane Shope, MD;  Location: [facility] ORS;  Service: General;  Laterality: Left;   PELVIC LYMPH NODE DISSECTION Bilateral 06/18/2023   Procedure: PELVIC LYMPH NODE DISSECTION;  Surgeon: Penne Knee, MD;  Location: [facility] ORS;  Service: Urology;  Laterality: Bilateral;   PROSTATE BIOPSY     ROBOT ASSISTED LAPAROSCOPIC RADICAL PROSTATECTOMY N/A 06/18/2023   Procedure: XI ROBOTIC ASSISTED LAPAROSCOPIC RADICAL PROSTATECTOMY;  Surgeon: Penne Knee, MD;  Location: [facility] ORS;  Service: Urology;  Laterality: N/A;   TONSILLECTOMY     age 72    ALLERGIES: Allergies  Allergen Reactions   Penicillins Rash    FAMILY HISTORY: Family History  Problem Relation Age of Onset   Hypertension Mother     Lung cancer Mother    Osteoporosis Mother    Alcohol abuse Mother    Arthritis Mother    Cancer Mother    Heart disease Mother    Hypertension Father    Cancer Father        Abdominal   Heart attack Father    Hypertension  Sister    Multiple sclerosis Sister     SOCIAL HISTORY: Social History   Tobacco Use   Smoking status: Former    Types: Cigarettes    Start date: 1984   Smokeless tobacco: Current   Tobacco comments:    Nicotine  patches   Vaping Use   Vaping status: Never Used  Substance Use Topics   Alcohol use: Not Currently    Alcohol/week: 168.0 standard drinks of alcohol    Types: 168 Cans of beer per week    Comment: pt lives in the [facility] for alcohol addiction   Drug use: Not Currently   Social History   Social History Narrative   Right handed   Lives at an [facility] with 7 other men   Retired   One story home    Objective:  Vital Signs:  BP (!) 136/90   Pulse 97   Ht 5' 11 (1.803 m)   Wt 220 lb (99.8 kg)   SpO2 97%   BMI 30.68 kg/m   General: General appearance: Awake and alert. No distress. Cooperative with exam.  Skin: No obvious rash or jaundice. HEENT: Atraumatic. Anicteric. Lungs: Non-labored breathing on room air  Heart: Regular  Neurological: Mental Status: Alert. Speech fluent. No pseudobulbar affect Cranial Nerves: CNII: No RAPD. Visual fields intact. CNIII, IV, VI: PERRL. No nystagmus. EOMI. CN V: Facial sensation intact bilaterally to fine touch. CN VII: Facial muscles symmetric and strong. No ptosis at rest. CN VIII: Hears finger rub well bilaterally. CN IX: No hypophonia. CN X: Palate elevates symmetrically. CN XI: Full strength shoulder shrug bilaterally. CN XII: Tongue protrusion full and midline. No atrophy or fasciculations. No significant dysarthria Motor: Tone is normal. Strength is 5/5 in bilateral upper and lower extremities Reflexes:  Right Left  Bicep 2-3+ 2-3+  Tricep 2-3+ 2-3+  BrRad 2-3+ 2-3+   Knee 3+ 3+  Ankle 0 1+   Sensation: Pinprick: Diminished in right thigh and left foot Coordination: Intact finger-to- nose-finger bilaterally. Romberg negative. Gait: Narrow based, antalgic gait.   Lab and Test Review: New results: 06/26/24: TSH wnl IFE: no M protein B1 wnl  MRI lumbar spine wo contrast (07/01/24): IMPRESSION: 1. No evidence of cauda equina syndrome. 2. Moderate chronic compression deformity of T12, unchanged. 3. Chronic downward bowing of the superior endplates of L1 and L2, as before. 4. Mild diffuse disc bulging at L2-3 with mild bilateral lateral recess stenosis. 5. Slight disc bulging at L3-4 and L5-S1 without significant spinal canal or neural foraminal stenosis.  MRI thoracic spine wo contrast (07/31/24): IMPRESSION: 1. No acute or suspicious osseous abnormality in the thoracic spine. Chronic T10, T11, and T12 compression fractures are stable from last year. 2. Mild for age thoracic spine degeneration above T8-T9. Mild chronic T12 retropulsion contributes to borderline or mild spinal stenosis at T11-T12. Lower thoracic facet arthropathy results in moderate to severe neural foraminal stenosis at the right T8 and T9 nerve levels.  MRI brain w/wo contrast (07/31/24): FINDINGS: Brain: No restricted diffusion to suggest acute infarction. No midline shift, mass effect, evidence of mass lesion, ventriculomegaly, extra-axial collection or acute intracranial hemorrhage. Cervicomedullary junction and pituitary are within normal limits.   Unchanged 2-3 mm area of mild susceptibility and faint postcontrast enhancement along the posterior right insula since 04/18/2024 (series 14, image 42, series 19, image 93, series 20, image 14). No regional mass effect no regional T2 or FLAIR hyperintensity.   Cerebral volume is within normal limits for age. And  elsewhere largely normal for age gray and white matter signal, mild nonspecific anterior right corona radiata  T2 and FLAIR hyperintensity is stable. No cortical encephalomalacia identified. No other chronic cerebral blood products on SWI.   No other abnormal enhancement. Incidental left middle frontal gyrus developmental venous anomaly (normal variant series 20, image 19). No abnormal dural thickening.   Vascular: Major intracranial vascular flow voids are preserved, stable. Following contrast major dural venous sinuses are enhancing and appear to be patent.   Skull and upper cervical spine: Visualized bone marrow signal is within normal limits. Negative visible cervical spine.   Sinuses/Orbits: Orbits appear stable and negative. Mild paranasal sinus mucosal thickening has not significantly changed.   Other: Mastoids are clear. Visible internal auditory structures appear normal. Negative visible scalp and face.   IMPRESSION: 1. No metastatic disease or acute intracranial abnormality.   2. 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 And otherwise normal for age MRI appearance of the brain.  MRI cervical spine wo contrast (08/12/24): BONES AND ALIGNMENT: Normal alignment. Normal vertebral body heights. Bone marrow signal is unremarkable.   SPINAL CORD: Normal spinal cord size. No abnormal spinal cord signal. No apparent impingement of the spinal cord.   SOFT TISSUES: No paraspinal mass.   C2-C3: No significant disc herniation. No spinal canal stenosis or neural foraminal narrowing.   C3-C4: Minimal disc bulging and mild bilateral uncovertebral joint hypertrophy with mild central spinal canal stenosis. The neural foramina are patent.   C4-C5: No significant disc herniation. No spinal canal stenosis or neural foraminal narrowing.   C5-C6: Broad-based disc bulge and endplate ridging with mild central spinal canal stenosis and mild right neural foraminal stenosis.   C6-C7: Diffuse disc bulging and  left-sided uncovertebral joint hypertrophy, with mild left spinal canal and left neural foraminal stenosis.   C7-T1: No significant disc herniation. No spinal canal stenosis or neural foraminal narrowing.   IMPRESSION: 1. Mild central spinal canal stenosis at C3-4, C5-6, and left C6-7. 2. Mild right neural foraminal stenosis at C5-6 and mild left neural foraminal stenosis at C6-7. 3. No apparent impingement of the spinal cord or nerve roots.  Previously reviewed results: 05/20/24: RF negative CCP negative ANA negative Folate wnl B12: 508   04/02/24: CRP wnl ESR wnl   Lipid panel (02/21/24): tChol 260, LDL 187, TG 134 TSH (09/25/23): low at 0.367 Vit D (09/25/23) wnl   Imaging/Procedures: MRI lumbar spine wo contrast (04/04/23): FINDINGS: Segmentation:  Standard.   Alignment:  Unchanged trace anterolisthesis at T11-T12.   Vertebrae: No acute fracture, evidence of discitis, or bone lesion. Chronic T11, T12, and L1 superior endplate compression deformities.   Conus medullaris and cauda equina: Conus extends to the L2 level. Conus and cauda equina appear normal.   Paraspinal and other soft tissues: Absent left kidney. Otherwise negative.   Disc levels:   T11-T12: Minimal retropulsion of the T12 posterosuperior endplate. Minimal disc bulging and mild bilateral facet arthropathy. No stenosis.   T12-L1:  Minimal disc bulging.  No stenosis.   L1-L2:  Minimal disc bulging.  No stenosis.   L2-L3:  Mild disc bulging.  No stenosis.   L3-L4: Mild disc bulging and bilateral facet arthropathy. Mild bilateral lateral recess stenosis. No spinal canal or neuroforaminal stenosis.   L4-L5: Mild disc bulging and bilateral facet arthropathy. Mild bilateral lateral recess and neuroforaminal stenosis. No spinal canal stenosis.   L5-S1: Mild disc bulging. Right subarticular annular fissure.  Mild bilateral facet arthropathy. Mild right lateral recess stenosis. Mild bilateral  neuroforaminal stenosis. No spinal canal stenosis.  IMPRESSION: 1. Mild multilevel lumbar spondylosis as described above. No high-grade stenosis or impingement. 2. Chronic T11, T12, and L1 superior endplate compression deformities.   MRI brain w/wo contrast (04/18/24): FINDINGS: Brain: There is no acute infarction or intracranial hemorrhage. There is no intracranial mass, mass effect, or edema. There is no hydrocephalus or extra-axial fluid collection. Incidental small left frontal developmental venous anomaly. There is 2 mm focus of ill-defined enhancement along the left insula (series 119, image 84). There is corresponding susceptibility hypointensity. Minimal punctate foci of T2 hyperintensity in the supratentorial white matter reflecting nonspecific gliosis/demyelination of doubtful significance.   Vascular: Major vessel flow voids at the skull base are preserved.   Skull and upper cervical spine: Normal marrow signal is preserved.   Sinuses/Orbits: Paranasal sinus mucosal thickening. Orbits are unremarkable.   Other: Sella is partially empty.  Mastoid air cells are clear.   IMPRESSION: No intracranial mass or edema. 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 Noting delay between study date and this report, consider a single follow-up study in a few weeks.   Audiology (04/30/24): CONCLUSION:  [place] has a mild high frequency sensorineural hearing loss bilaterally.  Word recognition is good in quiet at conversational speech levels bilaterally. Findings were reviewed with the patient.    RECOMMENDATIONS: Medical evaluation by an Ear, Nose and Throat physician.   Use of noise maskers or fans to distract from tinnitus at night.   Sniff test (05/26/24): IMPRESSION: Normal bilateral diaphragmatic motion during inspiration, expiration and sniffing.   No  evidence of diaphragmatic paralysis.  ASSESSMENT: This is [name], a 59 y.o. male with: Neck and back pain - likely related to known degenerative disc disease and compression fractures of T11, T12, L1. Despite being hyperflexic, there is no clear canal or foraminal stenosis. Patient is currently seeing Dr. Marcelino in pain management and will be starting PT soon. Headaches - have features of migraine with contributions from cervicalgia. He has responded well to nortriptyline  25 mg at bedtime and rizatriptan  as needed. He is having about 1 headache per week on average. Patient agreed to increase nortriptyline  to see if this helps headaches more, but also because this could help neuropathic pain from neck and back as well.  Plan: -Continue to follow with NSGY and pain management for neck and back pain -PT as planned -ENT as planned for dizziness/tinnitus/hearing loss -For migraines: Migraine prevention:  Increase nortriptyline  to 50 mg at bedtime  Migraine rescue:  Continue rizatriptan  10 mg as needed at headache onset, can repeat in 2 hours if needed Limit use of pain relievers to no more than 2 days out of week to prevent risk of rebound or medication-overuse headache. Keep headache diary  Return to clinic in 6 months  Total time spent reviewing records, interview, history/exam, documentation, and coordination of care on day of encounter:  35 min  Venetia Potters, MD

## 2024-09-23 ENCOUNTER — Ambulatory Visit
Attending: Student in an Organized Health Care Education/Training Program | Admitting: Student in an Organized Health Care Education/Training Program

## 2024-09-23 ENCOUNTER — Encounter: Payer: Self-pay | Admitting: Student in an Organized Health Care Education/Training Program

## 2024-09-23 VITALS — BP 133/95 | HR 84 | Temp 97.3°F | Ht 69.0 in | Wt 190.0 lb

## 2024-09-23 DIAGNOSIS — G894 Chronic pain syndrome: Secondary | ICD-10-CM | POA: Diagnosis present

## 2024-09-23 DIAGNOSIS — M4804 Spinal stenosis, thoracic region: Secondary | ICD-10-CM | POA: Insufficient documentation

## 2024-09-23 DIAGNOSIS — S22000S Wedge compression fracture of unspecified thoracic vertebra, sequela: Secondary | ICD-10-CM | POA: Diagnosis not present

## 2024-09-23 DIAGNOSIS — M47894 Other spondylosis, thoracic region: Secondary | ICD-10-CM | POA: Diagnosis not present

## 2024-09-23 DIAGNOSIS — M5414 Radiculopathy, thoracic region: Secondary | ICD-10-CM | POA: Insufficient documentation

## 2024-09-23 DIAGNOSIS — F1911 Other psychoactive substance abuse, in remission: Secondary | ICD-10-CM | POA: Insufficient documentation

## 2024-09-23 NOTE — Patient Instructions (Signed)

## 2024-09-23 NOTE — Progress Notes (Signed)
 Safety precautions to be maintained throughout the outpatient stay will include: orient to surroundings, keep bed in low position, maintain call bell within reach at all times, provide assistance with transfer out of bed and ambulation.

## 2024-09-23 NOTE — Progress Notes (Signed)
 PROVIDER NOTE: Interpretation of information contained herein should be left to medically-trained personnel. Specific patient instructions are provided elsewhere under Patient Instructions section of medical record. This document was created in part using AI and STT-dictation technology, any transcriptional errors that may result from this process are unintentional.  Patient: Frank Terry  Service: E/M Encounter  Provider: Wallie Sherry, MD  DOB: 1966-04-23  Delivery: Face-to-face  Specialty: Interventional Pain Management  MRN: [id number]  Setting: Ambulatory outpatient facility  Specialty designation: 09  Type: New Patient  Location: Outpatient office facility  PCP: Theophilus Pagan, MD  DOS: 09/23/2024    Referring Prov.: Hilma Hastings, PA-C   Primary Reason(s) for Visit: Encounter for initial evaluation of one or more chronic problems (new to examiner) potentially causing chronic pain, and posing a threat to normal musculoskeletal function. (Level of risk: High) CC: Back Pain (lower)  HPI  Frank Terry is a 58 y.o. year old, male patient, who comes for the first time to our practice referred by Hilma Hastings, PA-C for our initial evaluation of his chronic pain. He has Hyponatremia; Asthma, chronic; Gout; Prostate cancer (HCC); Hypertension; Gastric ulcer; Knee pain, bilateral; Compression fracture of spine (HCC); Anxious mood; B12 deficiency; Fatigue; Spondylosis of lumbar region without myelopathy or radiculopathy; Diverticulosis of intestine with bleeding; Congenital absence of left kidney; Allergic rhinitis; Osteoporosis; Skin lesion; Nodule of skin of back; Nodule of skin of head; Blurry vision; Soft tissue mass; New daily persistent headache; and Spindle cell lipoma on their problem list. Today he comes in for evaluation of his Back Pain (lower)  Pain Assessment: Location: Lower Back Radiating: radiates down right hip to  outside of right ankle Onset: More than a month ago Duration: Chronic  pain Quality: Aching, Sharp Severity: 4 /10 (subjective, self-reported pain score)  Effect on ADL: limits ADLs Timing: Constant Modifying factors: laying down BP: (!) 133/95  HR: 84  Onset and Duration: Present longer than 3 months Cause of pain: Unknown Severity: Getting worse, NAS-11 at its worse: 8/10, NAS-11 at its best: 2/10, NAS-11 now: 4/10, and NAS-11 on the average: 5/10 Timing: During activity or exercise and After a period of immobility Aggravating Factors: Bending, Climbing, Kneeling, Lifiting, Motion, Prolonged sitting, Prolonged standing, Squatting, Stooping , Twisting, Walking, Walking uphill, Walking downhill, and Working Alleviating Factors: Lying down Associated Problems: Dizziness, Fatigue, Numbness, Spasms, Sweating, Temperature changes, Tingling, Weakness, and Pain that wakes patient up Quality of Pain: Aching, Burning, Constant, Disabling, Exhausting, Feeling of constriction, Itching, Pulsating, Sharp, Shooting, Stabbing, Throbbing, and Tingling Previous Examinations or Tests: CT scan, MRI scan, X-rays, Neurological evaluation, Neurosurgical evaluation, and Orthopedic evaluation Previous Treatments: Epidural steroid injections, Narcotic medications, Physical Therapy, and Steroid treatments by mouth  Frank Terry is being evaluated for possible interventional pain management therapies for the treatment of his chronic pain.  Discussed the use of AI scribe software for clinical note transcription with the patient, who gave verbal consent to proceed.  History of Present Illness   Frank Terry is a 58 year old male with a chief complaint of thoracic pain. He was referred by Hastings Hilma from neurosurgery for evaluation of diffuse spinal pain however his thoracic region is the most severe.SABRA  He experiences diffuse spinal pain, with the most severe pain located in the mid back around his lower thoracic region where he has fractured vertebrae, history of thoracic compression  fractures. This pain radiates anteriorly, which he describes as more painful than the mid back pain itself. The pain is exacerbated  by standing, sitting, and activities such as cooking, and is only relieved by lying down.  He also experiences variable pain in the lower back, hips, and legs. Recently, after being active for about two hours, he experienced a burning sensation in his right thigh, describing it as feeling like it was 'on fire'.  He has a history of physical therapy. This therapy helped him sit and stand straighter.  No use of blood thinners and no diabetes.   He has tried NSAIDs and gabapentin  in the past.  He did not like how gabapentin  made him feel.  He has not tried Lyrica  Of note he does have a history of polysubstance abuse including alcohol and cocaine but has been sober for over 1 year      Historic Controlled Substance Pharmacotherapy Review  Historical Monitoring: The patient  reports that he does not currently use drugs. List of prior UDS Testing: Lab Results  Component Value Date   MDMA NONE DETECTED 03/02/2023   COCAINSCRNUR NONE DETECTED 03/02/2023   PCPSCRNUR NONE DETECTED 03/02/2023   THCU POSITIVE (A) 03/02/2023   ETH <10 03/03/2023   ETH <10 03/02/2023   Historical Background Evaluation: [place] PMP: PDMP not reviewed this encounter. Review of the past 19-months conducted.              [place] Department of public safety, offender search: Engineer, [employer] Information) Non-contributory Risk Assessment Profile: Aberrant behavior: None observed or detected today Risk factors for fatal opioid overdose: history of drug addiction, history of substance abuse, and history of substance use disorder Fatal overdose hazard ratio (HR): Calculation deferred Non-fatal overdose hazard ratio (HR): Calculation deferred Risk of opioid abuse or dependence: 0.7-3.0% with doses <= 36 MME/day and 6.1-26% with doses >= 120 MME/day. Substance use disorder (SUD) risk level: See  below Personal History of Substance Abuse (SUD-Substance use disorder):  Alcohol: Positive Male or Male  Illegal Drugs: Positive Male or Male  Rx Drugs: Positive Male or Male  ORT Risk Level calculation: High Risk  Opioid Risk Tool - 09/23/24 0839       Family History of Substance Abuse   Alcohol Positive Male   father   Illegal Drugs Negative    Rx Drugs Negative      Personal History of Substance Abuse   Alcohol Positive Male or Male    Illegal Drugs Positive Male or Male    Rx Drugs Positive Male or Male      Age   Age between 33-45 years  No      Psychological Disease   Psychological Disease Positive    ADD Negative    OCD Negative    Bipolar Positive   dx 1990's but does not take medications   Schizophrenia Negative    Depression Negative      Total Score   Opioid Risk Tool Scoring 17    Opioid Risk Interpretation High Risk         ORT Scoring interpretation table:  Score <3 = Low Risk for SUD  Score between 4-7 = Moderate Risk for SUD  Score >8 = High Risk for Opioid Abuse   PHQ-2 Depression Scale:  Total score: 0  PHQ-2 Scoring interpretation table: (Score and probability of major depressive disorder)  Score 0 = No depression  Score 1 = 15.4% Probability  Score 2 = 21.1% Probability  Score 3 = 38.4% Probability  Score 4 = 45.5% Probability  Score 5 = 56.4% Probability  Score 6 = 78.6% Probability  PHQ-9 Depression Scale:  Total score: 0  PHQ-9 Scoring interpretation table:  Score 0-4 = No depression  Score 5-9 = Mild depression  Score 10-14 = Moderate depression  Score 15-19 = Moderately severe depression  Score 20-27 = Severe depression (2.4 times higher risk of SUD and 2.89 times higher risk of overuse)   Pharmacologic Plan: No opioid analgesics.            Initial impression: Poor candidate for opioid analgesics.  Interventional pain management primarily  Meds   Current Outpatient Medications:    acetaminophen  (TYLENOL ) 500 MG  tablet, Take 1,000 mg by mouth every 6 (six) hours as needed., Disp: , Rfl:    albuterol  (VENTOLIN  HFA) 108 (90 Base) MCG/ACT inhaler, Inhale 2 puffs into the lungs every 6 (six) hours as needed for wheezing or shortness of breath., Disp: 8 g, Rfl: 2   cetirizine (ZYRTEC) 10 MG tablet, Take 10 mg by mouth daily. Unsure of dose, Disp: , Rfl:    colchicine  0.6 MG tablet, Take 0.6 mg by mouth as needed., Disp: , Rfl:    fluticasone -salmeterol (ADVAIR HFA) 230-21 MCG/ACT inhaler, Inhale 2 puffs into the lungs 2 (two) times daily., Disp: 1 each, Rfl: 12   irbesartan  (AVAPRO ) 150 MG tablet, Take 1 tablet (150 mg total) by mouth daily., Disp: 90 tablet, Rfl: 1   nortriptyline  (PAMELOR ) 25 MG capsule, Take 1 capsule (25 mg total) by mouth at bedtime., Disp: 30 capsule, Rfl: 5   rizatriptan  (MAXALT ) 10 MG tablet, Take 1 tablet (10 mg total) by mouth as needed for migraine. May repeat in 2 hours if needed, Disp: 10 tablet, Rfl: 5   sildenafil  (VIAGRA ) 100 MG tablet, Take 1 tablet (100 mg total) by mouth daily as needed for erectile dysfunction., Disp: 30 tablet, Rfl: 11   cyclobenzaprine  (FLEXERIL ) 5 MG tablet, Take 1 tablet (5 mg total) by mouth 3 (three) times daily as needed., Disp: 12 tablet, Rfl: 0   lidocaine  (LIDODERM ) 5 %, Place 1 patch onto the skin every 12 (twelve) hours. Remove & Discard patch within 12 hours or as directed by MD, Disp: 10 patch, Rfl: 0  Imaging Review  Cervical Imaging: Cervical MR wo contrast: Results for orders placed during the hospital encounter of 08/12/24  MR CERVICAL SPINE WO CONTRAST  Narrative EXAM: MRI CERVICAL SPINE WITHOUT CONTRAST 08/12/2024 07:42:00 AM  TECHNIQUE: Multiplanar multisequence MRI of the cervical spine was performed.  COMPARISON: CT of the cervical spine dated 07/30/2024.  CLINICAL HISTORY: Abnormal MRI. Weakness in limbs with pain in neck. HA's. Numb/ting in all extremities for 6 months.  FINDINGS:  BONES AND ALIGNMENT: Normal  alignment. Normal vertebral body heights. Bone marrow signal is unremarkable.  SPINAL CORD: Normal spinal cord size. No abnormal spinal cord signal. No apparent impingement of the spinal cord.  SOFT TISSUES: No paraspinal mass.  C2-C3: No significant disc herniation. No spinal canal stenosis or neural foraminal narrowing.  C3-C4: Minimal disc bulging and mild bilateral uncovertebral joint hypertrophy with mild central spinal canal stenosis. The neural foramina are patent.  C4-C5: No significant disc herniation. No spinal canal stenosis or neural foraminal narrowing.  C5-C6: Broad-based disc bulge and endplate ridging with mild central spinal canal stenosis and mild right neural foraminal stenosis.  C6-C7: Diffuse disc bulging and left-sided uncovertebral joint hypertrophy, with mild left spinal canal and left neural foraminal stenosis.  C7-T1: No significant disc herniation. No spinal canal stenosis or neural foraminal narrowing.  IMPRESSION: 1. Mild central spinal canal  stenosis at C3-4, C5-6, and left C6-7. 2. Mild right neural foraminal stenosis at C5-6 and mild left neural foraminal stenosis at C6-7. 3. No apparent impingement of the spinal cord or nerve roots.  Electronically signed by: Evalene Coho MD 08/12/2024 08:16 AM EDT RP Workstation: [id number]  Cervical MR wo contrast: No results found for this or any previous visit.  Cervical MR w/wo contrast: No results found for this or any previous visit.  Cervical MR w contrast: No results found for this or any previous visit.  Cervical CT wo contrast: Results for orders placed during the hospital encounter of 07/30/24  CT CERVICAL SPINE WO CONTRAST  Narrative CLINICAL DATA:  Cervicalgia  EXAM: CT CERVICAL SPINE WITHOUT CONTRAST  TECHNIQUE: Multidetector CT imaging of the cervical spine was performed without intravenous contrast. Multiplanar CT image reconstructions were also generated.  RADIATION  DOSE REDUCTION: This exam was performed according to the departmental dose-optimization program which includes automated exposure control, adjustment of the mA and/or kV according to patient size and/or use of iterative reconstruction technique.  COMPARISON:  None Available.  FINDINGS: Alignment: Facet joints are aligned without dislocation or traumatic listhesis. Dens and lateral masses are aligned. Straightening of the cervical lordosis, which may be positional.  Skull base and vertebrae: No acute fracture. No primary bone lesion or focal pathologic process.  Soft tissues and spinal canal: No prevertebral fluid or swelling. No visible canal hematoma.  Disc levels: Multilevel mild disc space narrowing and endplate spurring, most notably at C3-4, C5-6, and C6-7. Mild multilevel degenerative facet arthropathy. Foraminal narrowing appears most notably at C5-6 on the right. No evidence of high-grade canal stenosis by CT.  Upper chest: Included lung apices are clear.  Other: Bilateral carotid atherosclerosis.  IMPRESSION: 1. No acute fracture or traumatic listhesis of the cervical spine. 2. Mild multilevel degenerative changes of the cervical spine.   Electronically Signed By: Mabel Converse D.O. On: 08/06/2024 15:20  MR THORACIC SPINE WO CONTRAST  Narrative CLINICAL DATA:  58 year old male with back pain. Left side pain. Prostate cancer.  EXAM: MRI THORACIC SPINE WITHOUT CONTRAST  TECHNIQUE: Multiplanar, multisequence MR imaging of the thoracic spine was performed. No intravenous contrast was administered.  COMPARISON:  CT cervical spine 07/30/2024. CTA chest 03/20/2024. CT Abdomen and Pelvis 07/14/2023.  FINDINGS: Limited cervical spine imaging: Largely negative for age, concordant with cervical spine CT earlier this month.  Thoracic spine segmentation:  Normal on previous CTs.  Alignment: Chronic exaggerated lower thoracic kyphosis in conjunction with  chronic lower thoracic compression fractures, stable from CT last year. No significant thoracic scoliosis or spondylolisthesis.  Vertebrae: Chronic superior endplate compression fractures of T10, T11 and T12 are stable since last year. No associated marrow edema. Normal background bone marrow signal. Small chronic sclerotic focus superimposed in the right T10 vertebral body also stable by CT since last year and compatible with benign etiology, probably bone island. Stable, maintained thoracic vertebral height elsewhere. No marrow edema or evidence of acute osseous abnormality.  Cord: Normal. Capacious thoracic spinal canal at most levels, see details below. Conus medullaris mostly visible at T12-L1 and appears to be normal.  Paraspinal and other soft tissues: Stable, negative.  Disc levels:  No age advanced thoracic spine degeneration  T1-T2 through T7-T8.  T8-T9: Circumferential disc bulge with mild to moderate facet and ligament flavum hypertrophy. No spinal stenosis. Mild left and moderate to severe right T8 neural foraminal stenosis.  T9-T10: Negative disc. Moderate to severe facet hypertrophy. Mild left and moderate  to severe right T9 foraminal stenosis.  T10-T11: Minor disc bulging. Moderate facet and ligament flavum hypertrophy. Mild T10 foraminal stenosis.  T11-T12: Disc osteophyte complex in part related to chronic retropulsion of the posterosuperior T12 endplate, stable from last year. Moderate posterior element hypertrophy. Borderline to mild spinal stenosis. No cord mass effect. Mild bilateral T11 foraminal stenosis.  IMPRESSION: 1. No acute or suspicious osseous abnormality in the thoracic spine. Chronic T10, T11, and T12 compression fractures are stable from last year. 2. Mild for age thoracic spine degeneration above T8-T9. Mild chronic T12 retropulsion contributes to borderline or mild spinal stenosis at T11-T12. Lower thoracic facet arthropathy results  in moderate to severe neural foraminal stenosis at the right T8 and T9 nerve levels.   Electronically Signed By: VEAR Hurst M.D. On: 08/08/2024 10:57   MR LUMBAR SPINE WO CONTRAST  Narrative EXAM: MRI LUMBAR SPINE 07/01/2024 02:37:10 PM  TECHNIQUE: Multiplanar multisequence MRI of the lumbar spine was performed without the administration of intravenous contrast.  COMPARISON: MRI of the lumbar spine dated 04/04/2023.  CLINICAL HISTORY: Low back pain, cauda equina syndrome suspected. Pt reports lower back pain that radiates to his abdomen and is now causing him to leak urine. Pt has prostate hx and chronic back pain but the incontinence is new.  FINDINGS:  BONES AND ALIGNMENT: Moderate chronic compression deformity of T12, unchanged from prior study. The vertebral body has lost about 50% of its height anteriorly and there is a chronic schmorl's node within the superior endplate. There is chronic downward bowing of the superior endplates of L1 and L2, as before.  SPINAL CORD: The conus medullaris terminates at L1.  SOFT TISSUES: No paraspinal mass.  L1-L2: No significant disc herniation. No spinal canal stenosis or neural foraminal narrowing.  L2-L3: Mild diffuse disc bulging with mild bilateral lateral recess stenosis.  L3-L4: Slight disc bulging without significant spinal canal or neural foraminal stenosis.  L4-L5: No significant disc herniation. No spinal canal stenosis or neural foraminal narrowing.  L5-S1: Slight disc bulging without significant spinal canal or neural foraminal stenosis.  OTHER FINDINGS: The left kidney is not visualized.  IMPRESSION: 1. No evidence of cauda equina syndrome. 2. Moderate chronic compression deformity of T12, unchanged. 3. Chronic downward bowing of the superior endplates of L1 and L2, as before. 4. Mild diffuse disc bulging at L2-3 with mild bilateral lateral recess stenosis. 5. Slight disc bulging at L3-4 and  L5-S1 without significant spinal canal or neural foraminal stenosis.  Electronically signed by: evalene coho 07/01/2024 02:59 PM EDT RP Workstation: [id number]  DG Knee Complete 4 Views Right  Narrative CLINICAL DATA:  Chronic bilateral knee pain.  EXAM: RIGHT KNEE - COMPLETE 4+ VIEW  COMPARISON:  Right knee radiographs 10/10/2013  FINDINGS: Minimal medial compartment joint space narrowing. No joint effusion. No acute fracture or dislocation. Normal bone mineralization.  IMPRESSION: Minimal medial compartment joint space narrowing.   Electronically Signed By: Tanda Lyons M.D. On: 03/28/2023 08:58  Knee-L DG 4 views: Results for orders placed during the hospital encounter of 03/23/23  DG Knee Complete 4 Views Left  Narrative CLINICAL DATA:  Chronic bilateral knee pain with no recent injury.  EXAM: LEFT KNEE - COMPLETE 4+ VIEW  COMPARISON:  None Available.  FINDINGS: Normal bone mineralization. No joint effusion. Joint spaces are preserved. No acute fracture or dislocation.  IMPRESSION: Normal left knee radiographs.   Electronically Signed By: Tanda Lyons M.D. On: 03/28/2023 08:57   Complexity Note: Imaging results reviewed.  ROS  Cardiovascular: High blood pressure Pulmonary or Respiratory: Wheezing and difficulty taking a deep full breath (Asthma), Shortness of breath, and Temporary stoppage of breathing during sleep Neurological: Curved spine and Incontinence:  Urinary Psychological-Psychiatric: Psychiatric disorder Gastrointestinal: Vomiting blood (Ulcers) and Reflux or heatburn Genitourinary: No reported renal or genitourinary signs or symptoms such as difficulty voiding or producing urine, peeing blood, non-functioning kidney, kidney stones, difficulty emptying the bladder, difficulty controlling the flow of urine, or chronic kidney disease Hematological: No reported hematological signs or symptoms such as prolonged  bleeding, low or poor functioning platelets, bruising or bleeding easily, hereditary bleeding problems, low energy levels due to low hemoglobin or being anemic Endocrine: No reported endocrine signs or symptoms such as high or low blood sugar, rapid heart rate due to high thyroid  levels, obesity or weight gain due to slow thyroid  or thyroid  disease Rheumatologic: No reported rheumatological signs and symptoms such as fatigue, joint pain, tenderness, swelling, redness, heat, stiffness, decreased range of motion, with or without associated rash Musculoskeletal: Negative for myasthenia gravis, muscular dystrophy, multiple sclerosis or malignant hyperthermia Work History: Disabled  Allergies  Frank Terry is allergic to penicillins.  Laboratory Chemistry Profile   Renal Lab Results  Component Value Date   BUN 20 08/15/2024   CREATININE 1.13 08/15/2024   BCR 17 03/03/2024   GFRAA >60 10/31/2013   GFRNONAA >60 08/15/2024   SPECGRAV 1.015 01/08/2024   PHUR 5.5 01/08/2024   PROTEINUR NEGATIVE 07/01/2024     Electrolytes Lab Results  Component Value Date   NA 135 08/15/2024   K 3.8 08/15/2024   CL 104 08/15/2024   CALCIUM  9.5 08/15/2024   MG 2.2 03/05/2023   PHOS 3.3 03/04/2023     Hepatic Lab Results  Component Value Date   AST 15 08/15/2024   ALT 13 08/15/2024   ALBUMIN 4.2 08/15/2024   ALKPHOS 77 08/15/2024   LIPASE 35 03/20/2024     ID Lab Results  Component Value Date   HIV Non Reactive 03/02/2023   SARSCOV2NAA NEGATIVE 01/12/2024     Bone Lab Results  Component Value Date   VD25OH 30.6 09/25/2023     Endocrine Lab Results  Component Value Date   GLUCOSE 84 08/15/2024   GLUCOSEU NEGATIVE 07/01/2024   TSH 0.80 06/26/2024     Neuropathy Lab Results  Component Value Date   VITAMINB12 508 05/20/2024   FOLATE 5.4 05/20/2024   HIV Non Reactive 03/02/2023     CNS No results found for: COLORCSF, APPEARCSF, RBCCOUNTCSF, WBCCSF, POLYSCSF, LYMPHSCSF,  EOSCSF, PROTEINCSF, GLUCCSF, JCVIRUS, CSFOLI, IGGCSF, LABACHR, ACETBL   Inflammation (CRP: Acute  ESR: Chronic) Lab Results  Component Value Date   CRP 1 04/02/2024   ESRSEDRATE 6 04/02/2024   LATICACIDVEN 1.0 01/12/2024     Rheumatology Lab Results  Component Value Date   RF <10.0 05/20/2024   ANA Negative 05/20/2024     Coagulation Lab Results  Component Value Date   INR 1.0 06/13/2023   LABPROT 12.9 06/13/2023   PLT 301 08/15/2024   DDIMER 1.46 (H) 03/20/2024     Cardiovascular Lab Results  Component Value Date   BNP 141.2 (H) 08/15/2024   TROPONINI < 0.02 10/31/2013   HGB 13.9 08/15/2024   HCT 43.6 08/15/2024     Screening Lab Results  Component Value Date   SARSCOV2NAA NEGATIVE 01/12/2024   HIV Non Reactive 03/02/2023     Cancer No results found for: CEA, CA125, LABCA2   Allergens No results found for:  ALMOND, APPLE, ASPARAGUS, AVOCADO, BANANA, BARLEY, BASIL, BAYLEAF, GREENBEAN, LIMABEAN, WHITEBEAN, BEEFIGE, REDBEET, BLUEBERRY, BROCCOLI, CABBAGE, MELON, CARROT, CASEIN, CASHEWNUT, CAULIFLOWER, CELERY     Note: Lab results reviewed.  PFSH  Drug: Frank Terry  reports that he does not currently use drugs. Alcohol:  reports that he does not currently use alcohol after a past usage of about 168.0 standard drinks of alcohol per week. Tobacco:  reports that he has quit smoking. His smoking use included cigarettes. He started smoking about 41 years ago. He uses smokeless tobacco. Medical:  has a past medical history of Alcohol use disorder, Allergy, Anxiety, Arthritis, Asthma, Compression fracture of spine (HCC), Congenital absence of one kidney, Depression, Gastric ulcer, GERD (gastroesophageal reflux disease), Gout, HTN (hypertension), Hyponatremia, Osteoporosis, Prostate cancer (HCC), Ulcer, and Vasitis (05/2023). Family: family history includes Alcohol abuse in his mother; Arthritis in his mother;  Cancer in his father and mother; Heart attack in his father; Heart disease in his mother; Hypertension in his father, mother, and sister; Lung cancer in his mother; Multiple sclerosis in his sister; Osteoporosis in his mother.  Past Surgical History:  Procedure Laterality Date   COLONOSCOPY WITH ESOPHAGOGASTRODUODENOSCOPY (EGD)     MASS EXCISION Left 09/19/2023   Procedure: EXCISION MASS, deep soft tissue mass back;  Surgeon: Lane Shope, MD;  Location: [facility];  Service: General;  Laterality: Left;   PELVIC LYMPH NODE DISSECTION Bilateral 06/18/2023   Procedure: PELVIC LYMPH NODE DISSECTION;  Surgeon: Penne Knee, MD;  Location: [facility];  Service: Urology;  Laterality: Bilateral;   PROSTATE BIOPSY     ROBOT ASSISTED LAPAROSCOPIC RADICAL PROSTATECTOMY N/A 06/18/2023   Procedure: XI ROBOTIC ASSISTED LAPAROSCOPIC RADICAL PROSTATECTOMY;  Surgeon: Penne Knee, MD;  Location: [facility];  Service: Urology;  Laterality: N/A;   TONSILLECTOMY     age 56   Active Ambulatory Problems    Diagnosis Date Noted   Hyponatremia 03/02/2023   Asthma, chronic 03/02/2023   Gout 03/02/2023   Prostate cancer (HCC) 03/02/2023   Hypertension 03/03/2023   Gastric ulcer 03/03/2023   Knee pain, bilateral 03/19/2023   Compression fracture of spine (HCC) 03/19/2023   Anxious mood 03/19/2023   B12 deficiency 03/19/2023   Fatigue 04/06/2023   Spondylosis of lumbar region without myelopathy or radiculopathy 03/05/2020   Diverticulosis of intestine with bleeding 01/28/2016   Congenital absence of left kidney 01/28/2016   Allergic rhinitis 08/10/2023   Osteoporosis 08/10/2023   Skin lesion 08/10/2023   Nodule of skin of back 08/16/2023   Nodule of skin of head 08/16/2023   Blurry vision 08/16/2023   Soft tissue mass 09/11/2023   New daily persistent headache 09/25/2023   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 In no apparent acute distress Vitals:   09/23/24 0833  BP: (!) 133/95  Pulse: 84  Temp: (!) 97.3 F (36.3 C)  SpO2: 97%  Weight: 190 lb (86.2 kg)  Height: 5' 9 (1.753 m)   BMI Assessment: Estimated body mass index  is 28.06 kg/m as calculated from the following:   Height as of this encounter: 5' 9 (1.753 m).   Weight as of this encounter: 190 lb (86.2 kg).  BMI interpretation table: BMI level Category Range association with higher incidence of chronic pain  <18 kg/m2 Underweight   18.5-24.9 kg/m2 Ideal body weight   25-29.9 kg/m2 Overweight Increased incidence by 20%  30-34.9 kg/m2 Obese (Class I) Increased incidence by 68%  35-39.9 kg/m2 Severe obesity (Class II) Increased incidence by 136%  >40 kg/m2 Extreme obesity (Class III) Increased incidence by 254%   Patient's current BMI Ideal Body weight  Body mass index is 28.06 kg/m. Ideal body weight: 70.7 kg (155 lb 13.8 oz) Adjusted ideal body weight: 76.9 kg (169 lb 8.3 oz)   BMI Readings from Last 4 Encounters:  09/23/24 28.06 kg/m  08/15/24 30.72 kg/m  07/24/24 32.05 kg/m  07/01/24 31.31 kg/m   Wt Readings from Last 4 Encounters:  09/23/24 190 lb (86.2 kg)  08/15/24 208 lb (94.3 kg)  07/24/24 217 lb (98.4 kg)  07/01/24 212 lb (96.2 kg)    Psych/Mental status: Terry, oriented x 3 (person, place, & time)       Eyes: PERLA Respiratory: No evidence of acute respiratory distress  Thoracic Spine Area Exam  Skin & Axial Inspection: No masses, redness, or swelling Alignment: Symmetrical Functional ROM: Pain restricted ROM Stability: No instability detected Muscle  Tone/Strength: Functionally intact. No obvious neuro-muscular anomalies detected. Sensory (Neurological): Musculoskeletal pain pattern Muscle strength & Tone: No palpable anomalies Lumbar Spine Area Exam  Skin & Axial Inspection: No masses, redness, or swelling Alignment: Symmetrical Functional ROM: Pain restricted ROM       Stability: No instability detected Muscle Tone/Strength: Functionally intact. No obvious neuro-muscular anomalies detected. Sensory (Neurological): Musculoskeletal pain pattern  Gait & Posture Assessment  Ambulation: Unassisted Gait: Relatively normal for age and body habitus Posture: WNL  Lower Extremity Exam    Side: Right lower extremity  Side: Left lower extremity  Stability: No instability observed          Stability: No instability observed          Skin & Extremity Inspection: Skin color, temperature, and hair growth are WNL. No peripheral edema or cyanosis. No masses, redness, swelling, asymmetry, or associated skin lesions. No contractures.  Skin & Extremity Inspection: Skin color, temperature, and hair growth are WNL. No peripheral edema or cyanosis. No masses, redness, swelling, asymmetry, or associated skin lesions. No contractures.  Functional ROM: Unrestricted ROM                  Functional ROM: Unrestricted ROM                  Muscle Tone/Strength: Functionally intact. No obvious neuro-muscular anomalies detected.  Muscle Tone/Strength: Functionally intact. No obvious neuro-muscular anomalies detected.  Sensory (Neurological): Unimpaired        Sensory (Neurological): Unimpaired        DTR: Patellar: deferred today Achilles: deferred today Plantar: deferred today  DTR: Patellar: deferred today Achilles: deferred today Plantar: deferred today  Palpation: No palpable anomalies  Palpation: No palpable anomalies    Assessment  Primary Diagnosis & Pertinent Problem List: The primary encounter diagnosis was Radicular pain of thoracic region.  Diagnoses of Foraminal stenosis of thoracic region, Compression fracture of thoracic vertebra, unspecified thoracic vertebral level, sequela, Thoracic facet joint syndrome, History of substance abuse (HCC), and Chronic pain syndrome were also pertinent to this visit.  Visit  Diagnosis (New problems to examiner): 1. Radicular pain of thoracic region   2. Foraminal stenosis of thoracic region   3. Compression fracture of thoracic vertebra, unspecified thoracic vertebral level, sequela   4. Thoracic facet joint syndrome   5. History of substance abuse (HCC)   6. Chronic pain syndrome    Plan of Care (Initial workup plan)  General Recommendations: The pain condition that the patient suffers from is best treated with a multidisciplinary approach that involves an increase in physical activity to prevent de-conditioning and worsening of the pain cycle, as well as psychological counseling (formal and/or informal) to address the co-morbid psychological affects of pain. Treatment will often involve judicious use of physical therapy and interventional procedures to decrease the pain, allowing the patient to participate in the physical activity that will ultimately produce long-lasting pain reductions. The goal of the multidisciplinary approach is to return the patient to a higher level of overall function and to restore their ability to perform activities of daily living.   MRI shows chronic T10-T12 compression fractures (stable), borderline-mild central stenosis at T11-12, and lower-thoracic facet/foraminal disease with moderate-severe right T8-T9 foraminal stenosis, which matches the patient's mid-thoracic pain radiating anteriorly (thoracic radiculopathy). We will proceed with a thoracic interlaminar epidural steroid injection (fluoroscopic guidance) at T9-10 (or T10-11 if access dictates) using non-particulate steroid and contrast to ensure epidural spread cephalad toward T8-T9; given the mild canal narrowing  at T11-12 and prior fractures, we'll use low-volume, incremental injection and loss-of-resistance technique.  Do not recommend any long-term controlled substances for the management of his chronic pain condition given his prior history of substance abuse.  Procedure Orders         Thoracic Epidural Injection      Provider-requested follow-up: Return in about 13 days (around 10/06/2024) for Left T9/T10 ESI, in clinic [facility].  Future Appointments  Date Time Provider Department Center  09/26/2024 11:00 AM Leigh Venetia CROME, MD [facility] None  09/30/2024 10:00 AM Leroux-Martinez, Rosaline Jansky, AUD [facility] None  09/30/2024 10:30 AM Anice Riis, DO [facility] None  10/08/2024 11:40 AM Marcelino Nurse, MD [facility] None  10/22/2024  8:30 AM Hilma Hastings, PA-C [facility] [place]  06/29/2025  3:00 PM [facility] ANNUAL WELLNESS VISIT [facility]   I discussed the assessment and treatment plan with the patient. The patient was provided an opportunity to ask questions and all were answered. The patient agreed with the plan and demonstrated an understanding of the instructions.  Patient advised to call back or seek an in-person evaluation if the symptoms or condition worsens.  I personally spent a total of 60 minutes in the care of the patient today including preparing to see the patient, getting/reviewing separately obtained history, performing a medically appropriate exam/evaluation, counseling and educating, placing orders, and documenting clinical information in the EHR.   Note by: Nurse Marcelino, MD (TTS and AI technology used. I apologize for any typographical errors that were not detected and corrected.) Date: 09/23/2024; Time: 9:28 AM

## 2024-09-26 ENCOUNTER — Encounter: Payer: Self-pay | Admitting: Neurology

## 2024-09-26 ENCOUNTER — Ambulatory Visit (INDEPENDENT_AMBULATORY_CARE_PROVIDER_SITE_OTHER): Admitting: Neurology

## 2024-09-26 ENCOUNTER — Other Ambulatory Visit (INDEPENDENT_AMBULATORY_CARE_PROVIDER_SITE_OTHER): Payer: Self-pay

## 2024-09-26 VITALS — BP 136/90 | HR 97 | Ht 71.0 in | Wt 220.0 lb

## 2024-09-26 DIAGNOSIS — M542 Cervicalgia: Secondary | ICD-10-CM | POA: Diagnosis not present

## 2024-09-26 DIAGNOSIS — S22001S Stable burst fracture of unspecified thoracic vertebra, sequela: Secondary | ICD-10-CM

## 2024-09-26 DIAGNOSIS — M47816 Spondylosis without myelopathy or radiculopathy, lumbar region: Secondary | ICD-10-CM | POA: Diagnosis not present

## 2024-09-26 DIAGNOSIS — G43709 Chronic migraine without aura, not intractable, without status migrainosus: Secondary | ICD-10-CM | POA: Diagnosis not present

## 2024-09-26 DIAGNOSIS — H905 Unspecified sensorineural hearing loss: Secondary | ICD-10-CM

## 2024-09-26 MED ORDER — RIZATRIPTAN BENZOATE 10 MG PO TABS: 10.0000 mg | ORAL_TABLET | ORAL | 5 refills | Status: AC | PRN

## 2024-09-26 MED ORDER — NORTRIPTYLINE HCL 25 MG PO CAPS
50.0000 mg | ORAL_CAPSULE | Freq: Every day | ORAL | 3 refills | Status: AC
Start: 2024-09-26 — End: ?

## 2024-09-26 NOTE — Patient Instructions (Signed)
-  Continue to follow with neurosurgery and pain management for neck and back pain -Physical therapy as planned -ENT as planned for dizziness/tinnitus/hearing loss  -For migraines: Migraine prevention:  Increase nortriptyline  to 50 mg at bedtime (2 tablets) Migraine rescue:  Continue rizatriptan  10 mg as needed at headache onset, can repeat in 2 hours if needed Limit use of pain relievers to no more than 2 days out of week to prevent risk of rebound or medication-overuse headache. Keep headache diary  Return to clinic in 6 months  Please let me know if you have any questions or concerns in the meantime.   The physicians and staff at [facility] are committed to providing excellent care. You may receive a survey requesting feedback about your experience at our office. We strive to receive very good responses to the survey questions. If you feel that your experience would prevent you from giving the office a very good  response, please contact our office to try to remedy the situation. We may be reached at [phone]. Thank you for taking the time out of your busy day to complete the survey.  Venetia Potters, MD [facility]

## 2024-09-30 ENCOUNTER — Ambulatory Visit (INDEPENDENT_AMBULATORY_CARE_PROVIDER_SITE_OTHER)

## 2024-09-30 ENCOUNTER — Encounter (INDEPENDENT_AMBULATORY_CARE_PROVIDER_SITE_OTHER): Payer: Self-pay

## 2024-09-30 ENCOUNTER — Ambulatory Visit (INDEPENDENT_AMBULATORY_CARE_PROVIDER_SITE_OTHER): Admitting: Audiology

## 2024-09-30 VITALS — BP 141/92 | HR 87 | Temp 98.0°F | Wt 220.0 lb

## 2024-09-30 DIAGNOSIS — R42 Dizziness and giddiness: Secondary | ICD-10-CM | POA: Diagnosis not present

## 2024-09-30 DIAGNOSIS — H903 Sensorineural hearing loss, bilateral: Secondary | ICD-10-CM

## 2024-09-30 DIAGNOSIS — R220 Localized swelling, mass and lump, head: Secondary | ICD-10-CM | POA: Diagnosis not present

## 2024-09-30 DIAGNOSIS — H9313 Tinnitus, bilateral: Secondary | ICD-10-CM

## 2024-09-30 NOTE — Progress Notes (Signed)
 Dear Dr. Leigh, Here is my assessment for our mutual patient, Frank Terry. Thank you for allowing me the opportunity to care for your patient. Please do not hesitate to contact me should you have any other questions. Sincerely, Dr. Penne Croak  Otolaryngology Clinic Note Referring provider: Dr. Leigh HPI:  Discussed the use of AI scribe software for clinical note transcription with the patient, who gave verbal consent to proceed.  History of Present Illness Frank Terry is a 58 year old male who presents with tinnitus and dizziness.  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  Dizziness and vertigo - Episodes of dizziness and vertigo without clear triggers, occurring during activities and when lying down - Spinning sensation when walking - Dizziness associated with physical activity and sometimes coincides with nerve pain from back issues - Lightheadedness occurs with increased activity  Nerve pain and vertebral compression fractures - History of multiple vertebrae compression fractures - Nerve pain radiates around the body and worsens with activity - Believes nerve pain and vertebral fractures contribute to dizziness  Headache management - Currently taking nortriptyline , which provides relief for headaches but does not improve tinnitus  Independent Review of Additional Tests or Records:  Reviewed external note from referring PCP, Hill,describing relevant history incorporated into today's evaluation. I personally reviewed and interpreted normal hearing downsloping to moderate  HF SNHL bilaterally. Mild asymmetry a two high frequencies L>R. Word discrimination 100% right, 88% left. Has recent MRI of the brain - reviewed by me IAC clear bilaterally. Slightly limited evaluation 2/2 6mm cuts.     PMH/Meds/All/SocHx/FamHx/ROS:   Past Medical History:  Diagnosis Date   Alcohol use disorder    pt currently lives at the [facility]   Allergy    Anxiety    Arthritis    Asthma    well controlled   Compression fracture of spine (HCC)    Congenital absence of one kidney    pt has right kidney   Depression    Gastric ulcer    GERD (gastroesophageal reflux disease)    no meds   Gout    HTN (hypertension)    Hyponatremia    Osteoporosis    Prostate cancer (HCC)    Ulcer    Vasitis 05/2023     Past Surgical History:  Procedure Laterality Date   COLONOSCOPY WITH ESOPHAGOGASTRODUODENOSCOPY (EGD)     MASS EXCISION Left 09/19/2023   Procedure: EXCISION MASS, deep soft tissue mass back;  Surgeon: Lane Shope, MD;  Location: [facility];  Service: General;  Laterality: Left;   PELVIC LYMPH NODE DISSECTION Bilateral 06/18/2023   Procedure: PELVIC LYMPH NODE DISSECTION;  Surgeon: Penne Knee, MD;  Location: [facility] OR[facility];  Service: Urology;  Laterality: Bilateral;   PROSTATE BIOPSY     ROBOT ASSISTED LAPAROSCOPIC RADICAL PROSTATECTOMY N/A 06/18/2023   Procedure: XI ROBOTIC ASSISTED LAPAROSCOPIC RADICAL PROSTATECTOMY;  Surgeon: Penne Knee, MD;  Location: [facility] OR[facility];  Service: Urology;  Laterality: N/A;   TONSILLECTOMY     age 7    Family History  Problem Relation Age of Onset   Hypertension Mother    Lung cancer Mother  Osteoporosis Mother    Alcohol abuse Mother    Arthritis Mother    Cancer Mother    Heart disease Mother    Hypertension Father    Cancer Father        Abdominal   Heart attack Father    Hypertension Sister    Multiple sclerosis Sister      Social Connections: Moderately Integrated (06/26/2024)   Social Connection and Isolation  Panel    Frequency of Communication with Friends and Family: More than three times a week    Frequency of Social Gatherings with Friends and Family: More than three times a week    Attends Religious Services: More than 4 times per year    Active Member of [employer] or Organizations: Yes    Attends Engineer, Structural: More than 4 times per year    Marital Status: Divorced      Current Outpatient Medications:    acetaminophen  (TYLENOL ) 500 MG tablet, Take 1,000 mg by mouth every 6 (six) hours as needed., Disp: , Rfl:    albuterol  (VENTOLIN  HFA) 108 (90 Base) MCG/ACT inhaler, Inhale 2 puffs into the lungs every 6 (six) hours as needed for wheezing or shortness of breath., Disp: 8 g, Rfl: 2   cetirizine (ZYRTEC) 10 MG tablet, Take 10 mg by mouth daily. Unsure of dose, Disp: , Rfl:    colchicine  0.6 MG tablet, Take 0.6 mg by mouth as needed., Disp: , Rfl:    fluticasone -salmeterol (ADVAIR HFA) 230-21 MCG/ACT inhaler, Inhale 2 puffs into the lungs 2 (two) times daily., Disp: 1 each, Rfl: 12   irbesartan  (AVAPRO ) 150 MG tablet, Take 1 tablet (150 mg total) by mouth daily., Disp: 90 tablet, Rfl: 1   nortriptyline  (PAMELOR ) 25 MG capsule, Take 2 capsules (50 mg total) by mouth at bedtime., Disp: 180 capsule, Rfl: 3   rizatriptan  (MAXALT ) 10 MG tablet, Take 1 tablet (10 mg total) by mouth as needed for migraine. May repeat in 2 hours if needed, Disp: 10 tablet, Rfl: 5   sildenafil  (VIAGRA ) 100 MG tablet, Take 1 tablet (100 mg total) by mouth daily as needed for erectile dysfunction. (Patient not taking: Reported on 09/30/2024), Disp: 30 tablet, Rfl: 11   Physical Exam:   BP (!) 141/92 (BP Location: Left Arm, Patient Position: Sitting, Cuff Size: Normal)   Pulse 87   Temp 98 F (36.7 C)   Wt 220 lb (99.8 kg)   BMI 30.68 kg/m   The patient was awake, alert, and appropriate. The external ears were inspected, and otoscopy was performed to evaluate the external auditory canals and tympanic  membranes. The nasal cavity and septum were examined for mucosal changes, obstruction, or discharge. The oral cavity and oropharynx were inspected for mucosal lesions, infection, or tonsillar hypertrophy. The neck was palpated for lymphadenopathy, thyroid  abnormalities, or other masses. Cranial nerve function was grossly intact.  Pertinent Findings: Physical Exam HEENT: Normal oropharynx, moist mucous membranes. NECK: Palpable mass above the eye, does not feel like bone, possible blood vessel feeding the mass. NEUROLOGICAL: No dizziness with positional change, vision blurry. Dix hallpike negative bilatearlly Left forehead with soft tissue mass over left brow. Deep to subcutaneous tissue, mobile, nontender, no bony involvement.     Seprately Identifiable Procedures:  I personally ordered, reviewed and interpreted the following with the patient today  Procedure: Bilateral ear microscopy using microscope (CPT [phone]) Pre-procedure diagnosis: tinnitus Post-procedure diagnosis: same Indication: see above; given patient's otologic complaints and history, for improved and  comprehensive examination of external ear and tympanic membrane, bilateral otologic examination using microscope was performed. Prior to proceeding, verbal consent was obtained after discussion of R/B/A  Procedure: Patient was placed semi-recumbent. Both ear canals were examined using the microscope with findings below. Patient tolerated the procedure well.  Right ear:  No significant lesions pinna. EAC: no significant lesions. Canal is clear. Eczematoid changes. minimal TM: Intact   Left ear:  No significant lesions pinna. EAC: no significant lesions. Canal is clear. Eczematoid changes. minimal TM: Intact    Impression & Plans:  Frank Terry is a 58 y.o. male  1. Facial mass   2. Tinnitus of both ears   3. Sensorineural hearing loss (SNHL) of both ears    - Findings and diagnoses discussed in detail with the  patient. - Risks, benefits, and alternatives were reviewed. Through shared decision making, the patient elects to proceed with below. Assessment & Plan Localized lump of the head (planned surgical excision) Localized head lump, likely vascular, no bony involvement on CT. Prefers excision under anesthesia. - Scheduled surgical excision under anesthesia. - Administer antibiotics post-operatively. - Follow up in one week post-surgery.  Bilateral tinnitus and sensorineural hearing loss Chronic bilateral tinnitus with sensorineural hearing loss, left side worse. Likely due to past noise exposure. MRI negative for IAC tumor. Tinnitus compensatory to hearing loss. - Repeat hearing test in one year. - Encouraged use of background and white noise. - Recommended trial of hearing aids with 30-day return policy. - Advised ear protection in noisy environments.  Vertigo and dizziness Intermittent vertigo and dizziness, no positional changes, possible nerve-related etiology. Cardiac evaluation normal. - Discuss orthostatic blood pressure measurements and tilt table testing with primary care or cardiologist.  - Orders placed:  Orders Placed This Encounter  Procedures   Ambulatory Referral For Surgery Scheduling   - Medications prescribed/continued/adjusted: No orders of the defined types were placed in this encounter.  - Education materials provided to the patient. - Follow up: schedule at earliest convenience. Repeat audio in 6-12 months. Patient instructed to return sooner or go to the ED if new/worsening symptoms develop.  Thank you for allowing me the opportunity to care for your patient. Please do not hesitate to contact me should you have any other questions.  Sincerely, Penne Croak, DO Otolaryngologist (ENT) [facility] Phone: [phone] Fax: [phone]  09/30/2024, 3:26 PM

## 2024-09-30 NOTE — Progress Notes (Signed)
  [address] [place], [place] [address] [phone]  Audiological Evaluation    Name: Frank Terry     DOB:   18-Aug-1966      MRN:   [id number]                                                                                     Service Date: 09/30/2024     Accompanied by: daughter   Patient comes today after Dr. Anice, ENT sent a referral for a hearing evaluation due to concerns with dizziness.   Symptoms Yes Details  Hearing loss  [x]  Worse in the left ear  Tinnitus  [x]  Worse in the left ear - constant for 6-8 months, sometimes right ear will ring loud for a little and then go back to normal  Ear pain/ infections/pressure  []    Balance problems  [x]  Lightheaded/spinning for minutes; no known trigger  Noise exposure history  [x]  occupational  Previous ear surgeries  []    Family history of hearing loss [x]  Father, with age  Amplification  []    Other  [x]  Headaches/migraines    Otoscopy: Right ear: Clear external ear canal and notable landmarks visualized on the tympanic membrane. Left ear:  Clear external ear canal and notable landmarks visualized on the tympanic membrane.  Tympanometry: Right ear: Normal external ear canal volume with normal middle ear pressure and tympanic membrane compliance (Type A). Findings are suggestive of normal middle ear function. Left ear: Normal external ear canal volume with normal middle ear pressure and tympanic membrane compliance (Type A). Findings are suggestive of normal middle ear function.   Hearing Evaluation The hearing test results were completed under headphones and re-checked with inserts and results are deemed to be of good reliability. Test technique:  conventional    Pure tone Audiometry: Right ear- Normal hearing from [phone] Hz, then mild to moderate presumably sensorineural hearing loss from 6000 Hz - 8000 Hz. Left ear-  Normal hearing from [phone] Hz, then mild to moderate sensorineural hearing loss from  3000 Hz - 8000 Hz.  Speech Audiometry: Right ear- Speech Reception Threshold (SRT) was obtained at 15 dBHL. Left ear-Speech Reception Threshold (SRT) was obtained at 15 dBHL.   Word Recognition Score Tested using NU-6 (recorded) Right ear: 100% was obtained at a presentation level of 60 dBHL with contralateral masking which is deemed as  excellent. Left ear: 88% was obtained at a presentation level of 60 dBHL with contralateral masking which is deemed as  excellent.   Impression: There is a significant difference in pure-tone thresholds between ears, worse in the left ear from 3000-4000 Hz.   Recommendations: Follow up with ENT as scheduled for today. Return for a hearing evaluation if concerns with hearing changes arise or per MD recommendation. Consider various tinnitus strategies, including the use of a sound generator, hearing aid(s), after medical clearance, and/or tinnitus retraining therapy.    Arjay Jaskiewicz MARIE LEROUX-MARTINEZ, AUD

## 2024-09-30 NOTE — H&P (View-Only) (Signed)
 Dear Dr. Leigh, Here is my assessment for our mutual patient, Frank Terry. Thank you for allowing me the opportunity to care for your patient. Please do not hesitate to contact me should you have any other questions. Sincerely, Dr. Penne Croak  Otolaryngology Clinic Note Referring provider: Dr. Leigh HPI:  Discussed the use of AI scribe software for clinical note transcription with the patient, who gave verbal consent to proceed.  History of Present Illness Frank Terry is a 58 year old male who presents with tinnitus and dizziness.  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  Dizziness and vertigo - Episodes of dizziness and vertigo without clear triggers, occurring during activities and when lying down - Spinning sensation when walking - Dizziness associated with physical activity and sometimes coincides with nerve pain from back issues - Lightheadedness occurs with increased activity  Nerve pain and vertebral compression fractures - History of multiple vertebrae compression fractures - Nerve pain radiates around the body and worsens with activity - Believes nerve pain and vertebral fractures contribute to dizziness  Headache management - Currently taking nortriptyline , which provides relief for headaches but does not improve tinnitus  Independent Review of Additional Tests or Records:  Reviewed external note from referring PCP, Hill,describing relevant history incorporated into today's evaluation. I personally reviewed and interpreted normal hearing downsloping to moderate  HF SNHL bilaterally. Mild asymmetry a two high frequencies L>R. Word discrimination 100% right, 88% left. Has recent MRI of the brain - reviewed by me IAC clear bilaterally. Slightly limited evaluation 2/2 6mm cuts.     PMH/Meds/All/SocHx/FamHx/ROS:   Past Medical History:  Diagnosis Date   Alcohol use disorder    pt currently lives at the [facility]   Allergy    Anxiety    Arthritis    Asthma    well controlled   Compression fracture of spine (HCC)    Congenital absence of one kidney    pt has right kidney   Depression    Gastric ulcer    GERD (gastroesophageal reflux disease)    no meds   Gout    HTN (hypertension)    Hyponatremia    Osteoporosis    Prostate cancer (HCC)    Ulcer    Vasitis 05/2023     Past Surgical History:  Procedure Laterality Date   COLONOSCOPY WITH ESOPHAGOGASTRODUODENOSCOPY (EGD)     MASS EXCISION Left 09/19/2023   Procedure: EXCISION MASS, deep soft tissue mass back;  Surgeon: Lane Shope, MD;  Location: [facility] OR[facility];  Service: General;  Laterality: Left;   PELVIC LYMPH NODE DISSECTION Bilateral 06/18/2023   Procedure: PELVIC LYMPH NODE DISSECTION;  Surgeon: Penne Knee, MD;  Location: [facility];  Service: Urology;  Laterality: Bilateral;   PROSTATE BIOPSY     ROBOT ASSISTED LAPAROSCOPIC RADICAL PROSTATECTOMY N/A 06/18/2023   Procedure: XI ROBOTIC ASSISTED LAPAROSCOPIC RADICAL PROSTATECTOMY;  Surgeon: Penne Knee, MD;  Location: [facility];  Service: Urology;  Laterality: N/A;   TONSILLECTOMY     age 7    Family History  Problem Relation Age of Onset   Hypertension Mother    Lung cancer Mother  Osteoporosis Mother    Alcohol abuse Mother    Arthritis Mother    Cancer Mother    Heart disease Mother    Hypertension Father    Cancer Father        Abdominal   Heart attack Father    Hypertension Sister    Multiple sclerosis Sister      Social Connections: Moderately Integrated (06/26/2024)   Social Connection and Isolation  Panel    Frequency of Communication with Friends and Family: More than three times a week    Frequency of Social Gatherings with Friends and Family: More than three times a week    Attends Religious Services: More than 4 times per year    Active Member of [employer] or Organizations: Yes    Attends Engineer, Structural: More than 4 times per year    Marital Status: Divorced      Current Outpatient Medications:    acetaminophen  (TYLENOL ) 500 MG tablet, Take 1,000 mg by mouth every 6 (six) hours as needed., Disp: , Rfl:    albuterol  (VENTOLIN  HFA) 108 (90 Base) MCG/ACT inhaler, Inhale 2 puffs into the lungs every 6 (six) hours as needed for wheezing or shortness of breath., Disp: 8 g, Rfl: 2   cetirizine (ZYRTEC) 10 MG tablet, Take 10 mg by mouth daily. Unsure of dose, Disp: , Rfl:    colchicine  0.6 MG tablet, Take 0.6 mg by mouth as needed., Disp: , Rfl:    fluticasone -salmeterol (ADVAIR HFA) 230-21 MCG/ACT inhaler, Inhale 2 puffs into the lungs 2 (two) times daily., Disp: 1 each, Rfl: 12   irbesartan  (AVAPRO ) 150 MG tablet, Take 1 tablet (150 mg total) by mouth daily., Disp: 90 tablet, Rfl: 1   nortriptyline  (PAMELOR ) 25 MG capsule, Take 2 capsules (50 mg total) by mouth at bedtime., Disp: 180 capsule, Rfl: 3   rizatriptan  (MAXALT ) 10 MG tablet, Take 1 tablet (10 mg total) by mouth as needed for migraine. May repeat in 2 hours if needed, Disp: 10 tablet, Rfl: 5   sildenafil  (VIAGRA ) 100 MG tablet, Take 1 tablet (100 mg total) by mouth daily as needed for erectile dysfunction. (Patient not taking: Reported on 09/30/2024), Disp: 30 tablet, Rfl: 11   Physical Exam:   BP (!) 141/92 (BP Location: Left Arm, Patient Position: Sitting, Cuff Size: Normal)   Pulse 87   Temp 98 F (36.7 C)   Wt 220 lb (99.8 kg)   BMI 30.68 kg/m   The patient was awake, alert, and appropriate. The external ears were inspected, and otoscopy was performed to evaluate the external auditory canals and tympanic  membranes. The nasal cavity and septum were examined for mucosal changes, obstruction, or discharge. The oral cavity and oropharynx were inspected for mucosal lesions, infection, or tonsillar hypertrophy. The neck was palpated for lymphadenopathy, thyroid  abnormalities, or other masses. Cranial nerve function was grossly intact.  Pertinent Findings: Physical Exam HEENT: Normal oropharynx, moist mucous membranes. NECK: Palpable mass above the eye, does not feel like bone, possible blood vessel feeding the mass. NEUROLOGICAL: No dizziness with positional change, vision blurry. Dix hallpike negative bilatearlly Left forehead with soft tissue mass over left brow. Deep to subcutaneous tissue, mobile, nontender, no bony involvement.     Seprately Identifiable Procedures:  I personally ordered, reviewed and interpreted the following with the patient today  Procedure: Bilateral ear microscopy using microscope (CPT [phone]) Pre-procedure diagnosis: tinnitus Post-procedure diagnosis: same Indication: see above; given patient's otologic complaints and history, for improved and  comprehensive examination of external ear and tympanic membrane, bilateral otologic examination using microscope was performed. Prior to proceeding, verbal consent was obtained after discussion of R/B/A  Procedure: Patient was placed semi-recumbent. Both ear canals were examined using the microscope with findings below. Patient tolerated the procedure well.  Right ear:  No significant lesions pinna. EAC: no significant lesions. Canal is clear. Eczematoid changes. minimal TM: Intact   Left ear:  No significant lesions pinna. EAC: no significant lesions. Canal is clear. Eczematoid changes. minimal TM: Intact    Impression & Plans:  Frank Terry is a 58 y.o. male  1. Facial mass   2. Tinnitus of both ears   3. Sensorineural hearing loss (SNHL) of both ears    - Findings and diagnoses discussed in detail with the  patient. - Risks, benefits, and alternatives were reviewed. Through shared decision making, the patient elects to proceed with below. Assessment & Plan Localized lump of the head (planned surgical excision) Localized head lump, likely vascular, no bony involvement on CT. Prefers excision under anesthesia. - Scheduled surgical excision under anesthesia. - Administer antibiotics post-operatively. - Follow up in one week post-surgery.  Bilateral tinnitus and sensorineural hearing loss Chronic bilateral tinnitus with sensorineural hearing loss, left side worse. Likely due to past noise exposure. MRI negative for IAC tumor. Tinnitus compensatory to hearing loss. - Repeat hearing test in one year. - Encouraged use of background and white noise. - Recommended trial of hearing aids with 30-day return policy. - Advised ear protection in noisy environments.  Vertigo and dizziness Intermittent vertigo and dizziness, no positional changes, possible nerve-related etiology. Cardiac evaluation normal. - Discuss orthostatic blood pressure measurements and tilt table testing with primary care or cardiologist.  - Orders placed:  Orders Placed This Encounter  Procedures   Ambulatory Referral For Surgery Scheduling   - Medications prescribed/continued/adjusted: No orders of the defined types were placed in this encounter.  - Education materials provided to the patient. - Follow up: schedule at earliest convenience. Repeat audio in 6-12 months. Patient instructed to return sooner or go to the ED if new/worsening symptoms develop.  Thank you for allowing me the opportunity to care for your patient. Please do not hesitate to contact me should you have any other questions.  Sincerely, Penne Croak, DO Otolaryngologist (ENT) [facility] Phone: [phone] Fax: [phone]  09/30/2024, 3:26 PM

## 2024-10-08 ENCOUNTER — Ambulatory Visit (HOSPITAL_BASED_OUTPATIENT_CLINIC_OR_DEPARTMENT_OTHER): Admitting: Student in an Organized Health Care Education/Training Program

## 2024-10-08 ENCOUNTER — Ambulatory Visit
Admission: RE | Admit: 2024-10-08 | Discharge: 2024-10-08 | Disposition: A | Discharge status: Home / Self Care | Source: Ambulatory Visit | Attending: Student in an Organized Health Care Education/Training Program | Admitting: Student in an Organized Health Care Education/Training Program

## 2024-10-08 ENCOUNTER — Encounter: Payer: Self-pay | Admitting: Student in an Organized Health Care Education/Training Program

## 2024-10-08 VITALS — BP 141/105 | HR 85 | Temp 98.1°F | Resp 15 | Ht 69.0 in | Wt 220.0 lb

## 2024-10-08 DIAGNOSIS — S22000S Wedge compression fracture of unspecified thoracic vertebra, sequela: Secondary | ICD-10-CM | POA: Insufficient documentation

## 2024-10-08 DIAGNOSIS — M4804 Spinal stenosis, thoracic region: Secondary | ICD-10-CM

## 2024-10-08 DIAGNOSIS — M5414 Radiculopathy, thoracic region: Secondary | ICD-10-CM

## 2024-10-08 MED ORDER — DEXAMETHASONE SOD PHOSPHATE PF 10 MG/ML IJ SOLN
10.0000 mg | Freq: Once | INTRAMUSCULAR | Status: AC
  Administered 2024-10-08: 10 mg

## 2024-10-08 MED ORDER — SODIUM CHLORIDE (PF) 0.9 % IJ SOLN
INTRAMUSCULAR | Status: AC
  Filled 2024-10-08: qty 10

## 2024-10-08 MED ORDER — ROPIVACAINE HCL 2 MG/ML IJ SOLN
INTRAMUSCULAR | Status: AC
  Filled 2024-10-08: qty 20

## 2024-10-08 MED ORDER — SODIUM CHLORIDE 0.9% FLUSH
2.0000 mL | Freq: Once | INTRAVENOUS | Status: AC
  Administered 2024-10-08: 2 mL

## 2024-10-08 MED ORDER — IOHEXOL 180 MG/ML  SOLN
INTRAMUSCULAR | Status: AC
Start: 2024-10-08 — End: 2024-10-08
  Filled 2024-10-08: qty 20

## 2024-10-08 MED ORDER — ROPIVACAINE HCL 2 MG/ML IJ SOLN
2.0000 mL | Freq: Once | INTRAMUSCULAR | Status: AC
  Administered 2024-10-08: 2 mL via EPIDURAL

## 2024-10-08 MED ORDER — IOHEXOL 180 MG/ML  SOLN
10.0000 mL | Freq: Once | INTRAMUSCULAR | Status: AC
  Administered 2024-10-08: 10 mL via EPIDURAL

## 2024-10-08 MED ORDER — LIDOCAINE HCL 2 % IJ SOLN
20.0000 mL | Freq: Once | INTRAMUSCULAR | Status: AC
  Administered 2024-10-08: 100 mg

## 2024-10-08 MED ORDER — LIDOCAINE HCL (PF) 2 % IJ SOLN
INTRAMUSCULAR | Status: AC
  Filled 2024-10-08: qty 5

## 2024-10-08 NOTE — Progress Notes (Signed)
 Safety precautions to be maintained throughout the outpatient stay will include: orient to surroundings, keep bed in low position, maintain call bell within reach at all times, provide assistance with transfer out of bed and ambulation.

## 2024-10-08 NOTE — Progress Notes (Signed)
 PROVIDER NOTE: Interpretation of information contained herein should be left to medically-trained personnel. Specific patient instructions are provided elsewhere under Patient Instructions section of medical record. This document was created in part using STT-dictation technology, any transcriptional errors that may result from this process are unintentional.  Patient: Frank Terry Alert Type: Established DOB: 1966-02-11 MRN: [id number] PCP: Theophilus Pagan, MD  Service: Procedure DOS: 10/08/2024 Setting: Ambulatory Location: Ambulatory outpatient facility Delivery: Face-to-face Provider: Wallie Sherry, MD Specialty: Interventional Pain Management Specialty designation: 09 Location: Outpatient facility Ref. Prov.: Theophilus Pagan, MD       Interventional Therapy   Type:  Inter-Laminar Thoracic Epidural Steroid Block/Injection  #1  Laterality: Left Level: T10-11  DOS: 10/08/2024 Performed by: Wallie Sherry, MD Imaging: Fluoroscopic guidance Anesthesia: Local anesthesia (1-2% Lidocaine ) Purpose: Diagnostic/Therapeutic Indications: Thoracic back pain, radicular pain, with degenerative disc disease severe enough to impact quality of life or function. 1. Radicular pain of thoracic region   2. Foraminal stenosis of thoracic region   3. Compression fracture of thoracic vertebra, unspecified thoracic vertebral level, sequela    NAS-11 Pain score:   Pre-procedure: 4 /10   Post-procedure: 4 /10      Position / Prep / Materials: Position: Prone  Prep solution: ChloraPrep (2% chlorhexidine  gluconate and 70% isopropyl alcohol) Prep Area: Posterior Thoracolumbar (Upper back from shoulders to lower lumbar region).  Materials:  Tray: Epidural Needle(s) Type: Epidural needle Gauge (G): 22 Length: Regular (3.5-in) Qty: 1  H&P (Pre-op Assessment):  Frank Terry is a 58 y.o. (year old), male patient, seen today for interventional treatment. He  has a past surgical history that includes  Tonsillectomy; Colonoscopy with esophagogastroduodenoscopy (egd); Prostate biopsy; Robot assisted laparoscopic radical prostatectomy (N/A, 06/18/2023); Pelvic lymph node dissection (Bilateral, 06/18/2023); and Mass excision (Left, 09/19/2023). Frank Terry has a current medication list which includes the following prescription(s): acetaminophen , albuterol , cetirizine, colchicine , fluticasone -salmeterol, irbesartan , nortriptyline , rizatriptan , and sildenafil . His primarily concern today is the Back Pain (Pain in mid medial back and wraps around to abdomen to the sternum uptowards the chest . From mid back down to lower back bilateral)  Initial Vital Signs:  Pulse/HCG Rate: 85ECG Heart Rate: 95 Temp: 98.1 F (36.7 C) Resp: 14 BP: (!) 134/94 SpO2: 100 %  BMI: Estimated body mass index is 32.49 kg/m as calculated from the following:   Height as of this encounter: 5' 9 (1.753 m).   Weight as of this encounter: 220 lb (99.8 kg).  Risk Assessment: Allergies: Reviewed. He is allergic to penicillins.  Allergy Precautions: None required Coagulopathies: Reviewed. None identified.  Blood-thinner therapy: None at this time Active Infection(s): Reviewed. None identified. Frank Terry is afebrile  Site Confirmation: Frank Terry was asked to confirm the procedure and laterality before marking the site Procedure checklist: Completed Consent: Before the procedure and under the influence of no sedative(s), amnesic(s), or anxiolytics, the patient was informed of the treatment options, risks and possible complications. To fulfill our ethical and legal obligations, as recommended by the American Medical Association's Code of Ethics, I have informed the patient of my clinical impression; the nature and purpose of the treatment or procedure; the risks, benefits, and possible complications of the intervention; the alternatives, including doing nothing; the risk(s) and benefit(s) of the alternative treatment(s) or  procedure(s); and the risk(s) and benefit(s) of doing nothing. The patient was provided information about the general risks and possible complications associated with the procedure. These may include, but are not limited to: failure to achieve desired goals, infection, bleeding, organ  or nerve damage, allergic reactions, paralysis, and death. In addition, the patient was informed of those risks and complications associated to Spine-related procedures, such as failure to decrease pain; infection (i.e.: Meningitis, epidural or intraspinal abscess); bleeding (i.e.: epidural hematoma, subarachnoid hemorrhage, or any other type of intraspinal or peri-dural bleeding); organ or nerve damage (i.e.: Any type of peripheral nerve, nerve root, or spinal cord injury) with subsequent damage to sensory, motor, and/or autonomic systems, resulting in permanent pain, numbness, and/or weakness of one or several areas of the body; allergic reactions; (i.e.: anaphylactic reaction); and/or death. Furthermore, the patient was informed of those risks and complications associated with the medications. These include, but are not limited to: allergic reactions (i.e.: anaphylactic or anaphylactoid reaction(s)); adrenal axis suppression; blood sugar elevation that in diabetics may result in ketoacidosis or comma; water  retention that in patients with history of congestive heart failure may result in shortness of breath, pulmonary edema, and decompensation with resultant heart failure; weight gain; swelling or edema; medication-induced neural toxicity; particulate matter embolism and blood vessel occlusion with resultant organ, and/or nervous system infarction; and/or aseptic necrosis of one or more joints. Finally, the patient was informed that Medicine is not an exact science; therefore, there is also the possibility of unforeseen or unpredictable risks and/or possible complications that may result in a catastrophic outcome. The patient  indicated having understood very clearly. We have given the patient no guarantees and we have made no promises. Enough time was given to the patient to ask questions, all of which were answered to the patient's satisfaction. Mr. Labreck has indicated that he wanted to continue with the procedure. Attestation: I, the ordering provider, attest that I have discussed with the patient the benefits, risks, side-effects, alternatives, likelihood of achieving goals, and potential problems during recovery for the procedure that I have provided informed consent. Date  Time: 10/08/2024 11:27 AM  Pre-Procedure Preparation:  Monitoring: As per clinic protocol. Respiration, ETCO2, SpO2, BP, heart rate and rhythm monitor placed and checked for adequate function Safety Precautions: Patient was assessed for positional comfort and pressure points before starting the procedure. Time-out: I initiated and conducted the Time-out before starting the procedure, as per protocol. The patient was asked to participate by confirming the accuracy of the Time Out information. Verification of the correct person, site, and procedure were performed and confirmed by me, the nursing staff, and the patient. Time-out conducted as per Joint Commission's Universal Protocol (UP.01.01.01). Time: 1204 Start Time: 1204 hrs.   Description of Procedure:         Target Area: For Epidural Steroid injection(s), the target area is the  interlaminar space, initially targeting the lower border of the superior vertebral body lamina. Approach: Interlaminar approach. Area Prepped: Entire Posterior Thoracolumbar Region ChloraPrep (2% chlorhexidine  gluconate and 70% isopropyl alcohol) Safety Precautions: Aspiration looking for blood return was conducted prior to all injections. At no point did we inject any substances, as a needle was being advanced. No attempts were made at seeking any paresthesias. Safe injection practices and needle disposal  techniques used. Medications properly checked for expiration dates. SDV (single dose vial) medications used. Description of the Procedure: Protocol guidelines were followed. The patient was placed in position over the fluoroscopy table. The target area was identified and the area prepped in the usual manner. Skin & deeper tissues infiltrated with local anesthetic. Appropriate amount of time allowed to pass for local anesthetics to take effect. The procedure needles were then advanced to the target area. The  inferior aspect of the superior lamina was contacted and the needle walked caudad, until the lamina was cleared. The epidural space was identified using loss-of-resistance technique with 0.9% PF-NSS (2-3mL), in a low friction 10cc LOR glass syringe. Proper needle placement was secured. Negative aspiration confirmed. Solution injected in intermittent fashion, asking for systemic symptoms every 0.5 cc of injectate. The needles were then removed and the area cleansed, making sure to leave some of the prepping solution behind to take advantage of its long term bactericidal properties. Vitals:   10/08/24 1204 10/08/24 1209 10/08/24 1214 10/08/24 1217  BP: (!) 138/103 (!) 152/108  (!) 141/105  Pulse:      Resp: 18 18 15 15   Temp:      TempSrc:      SpO2: 96% 95% 95% 96%  Weight:      Height:        Start Time: 1204 hrs. End Time: 1217 hrs. Imaging Guidance (Spinal):          Type of Imaging Technique: Fluoroscopy Guidance (Spinal) Indication(s): Fluoroscopy guidance for needle placement to enhance accuracy in procedures requiring precise needle localization for targeted delivery of medication in or near specific anatomical locations not easily accessible without such real-time imaging assistance. Exposure Time: Please see nurses notes. Contrast: Before injecting any contrast, we confirmed that the patient did not have an allergy to iodine, shellfish, or radiological contrast. Once satisfactory  needle placement was completed at the desired level, radiological contrast was injected. Contrast injected under live fluoroscopy. No contrast complications. See chart for type and volume of contrast used. Fluoroscopic Guidance: I was personally present during the use of fluoroscopy. Tunnel Vision Technique used to obtain the best possible view of the target area. Parallax error corrected before commencing the procedure. Direction-depth-direction technique used to introduce the needle under continuous pulsed fluoroscopy. Once target was reached, antero-posterior, oblique, and lateral fluoroscopic projection used confirm needle placement in all planes. Images permanently stored in EMR. Interpretation: I personally interpreted the imaging intraoperatively. Adequate needle placement confirmed in multiple planes. Appropriate spread of contrast into desired area was observed. No evidence of afferent or efferent intravascular uptake. No intrathecal or subarachnoid spread observed. Permanent images saved into the patient's record.  Antibiotic Prophylaxis:   Anti-infectives (From admission, onward)    None      Indication(s): None identified  Post-operative Assessment:  Post-procedure Vital Signs:  Pulse/HCG Rate: 8591 Temp: 98.1 F (36.7 C) Resp: 15 BP: (!) 141/105 SpO2: 96 %  EBL: None  Complications: No immediate post-treatment complications observed by team, or reported by patient.  Note: The patient tolerated the entire procedure well. A repeat set of vitals were taken after the procedure and the patient was kept under observation following institutional policy, for this type of procedure. Post-procedural neurological assessment was performed, showing return to baseline, prior to discharge. The patient was provided with post-procedure discharge instructions, including a section on how to identify potential problems. Should any problems arise concerning this procedure, the patient was  given instructions to immediately contact us , at any time, without hesitation. In any case, we plan to contact the patient by telephone for a follow-up status report regarding this interventional procedure.  Comments:  No additional relevant information.  Plan of Care (POC)  Orders:  Orders Placed This Encounter  Procedures   DG PAIN CLINIC C-ARM 1-60 MIN NO REPORT    Intraoperative interpretation by procedural physician at [facility].    Standing Status:   Standing  Number of Occurrences:   1    Reason for exam::   Assistance in needle guidance and placement for procedures requiring needle placement in or near specific anatomical locations not easily accessible without such assistance.    Medications ordered for procedure: Meds ordered this encounter  Medications   iohexol  (OMNIPAQUE ) 180 MG/ML injection 10 mL    Must be Myelogram-compatible. If not available, you may substitute with a water -soluble, non-ionic, hypoallergenic, myelogram-compatible radiological contrast medium.   lidocaine  (XYLOCAINE ) 2 % (with pres) injection 400 mg   ropivacaine  (PF) 2 mg/mL (0.2%) (NAROPIN ) injection 2 mL   sodium chloride  flush (NS) 0.9 % injection 2 mL   dexamethasone  (DECADRON ) injection 10 mg   Medications administered: We administered iohexol , lidocaine , ropivacaine  (PF) 2 mg/mL (0.2%), sodium chloride  flush, and dexamethasone .  See the medical record for exact dosing, route, and time of administration.    Left T10/T11 ESI (unable to enter T9/T10)    Follow-up plan:   No follow-ups on file.     Recent Visits Date Type Provider Dept  09/23/24 Office Visit Marcelino Nurse, MD [facility]  Showing recent visits within past 90 days and meeting all other requirements Today's Visits Date Type Provider Dept  10/08/24 Procedure visit Marcelino Nurse, MD [facility]  Showing today's visits and meeting all other requirements Future Appointments Date Type  Provider Dept  11/05/24 Appointment Patel, Seema K, NP [facility]  Showing future appointments within next 90 days and meeting all other requirements   Disposition: Discharge home  Discharge (Date  Time): 10/08/2024; 1225 hrs.   Primary Care Physician: Theophilus Pagan, MD Location: [facility] Note by: Nurse Marcelino, MD (TTS technology used. I apologize for any typographical errors that were not detected and corrected.) Date: 10/08/2024; Time: 12:24 PM  Disclaimer:  Medicine is not an visual merchandiser. The only guarantee in medicine is that nothing is guaranteed. It is important to note that the decision to proceed with this intervention was based on the information collected from the patient. The Data and conclusions were drawn from the patient's questionnaire, the interview, and the physical examination. Because the information was provided in large part by the patient, it cannot be guaranteed that it has not been purposely or unconsciously manipulated. Every effort has been made to obtain as much relevant data as possible for this evaluation. It is important to note that the conclusions that lead to this procedure are derived in large part from the available data. Always take into account that the treatment will also be dependent on availability of resources and existing treatment guidelines, considered by other Pain Management Practitioners as being common knowledge and practice, at the time of the intervention. For Medico-Legal purposes, it is also important to point out that variation in procedural techniques and pharmacological choices are the acceptable norm. The indications, contraindications, technique, and results of the above procedure should only be interpreted and judged by a Board-Certified Interventional Pain Specialist with extensive familiarity and expertise in the same exact procedure and technique.

## 2024-10-08 NOTE — Patient Instructions (Signed)
 ______________________________________________________________________    Post-Procedure Discharge Instructions  INSTRUCTIONS Apply ice:  Purpose: This will minimize any swelling and discomfort after procedure.  When: Day of procedure, as soon as you get home. How: Fill a plastic sandwich bag with crushed ice. Cover it with a small towel and apply to injection site. How long: (15 min on, 15 min off) Apply for 15 minutes then remove x 15 minutes.  Repeat sequence on day of procedure, until you go to bed. Apply heat:  Purpose: To treat any soreness and discomfort from the procedure. When: Starting the next day after the procedure. How: Apply heat to procedure site starting the day following the procedure. How long: May continue to repeat daily, until discomfort goes away. Food intake: Start with clear liquids (like water ) and advance to regular food, as tolerated.  Physical activities: Keep activities to a minimum for the first 8 hours after the procedure. After that, then as tolerated. Driving: If you have received any sedation, be responsible and do not drive. You are not allowed to drive for 24 hours after having sedation. Blood thinner: (Applies only to those taking blood thinners) You may restart your blood thinner 6 hours after your procedure. Insulin: (Applies only to Diabetic patients taking insulin) As soon as you can eat, you may resume your normal dosing schedule. Infection prevention: Keep procedure site clean and dry. Shower daily and clean area with soap and water .  PAIN DIARY Post-procedure Pain Diary: Extremely important that this be done correctly and accurately. Recorded information will be used to determine the next step in treatment. For the purpose of accuracy, follow these rules: Evaluate only the area treated. Do not report or include pain from an untreated area. For the purpose of this evaluation, ignore all other areas of pain, except for the treated area. After your  procedure, avoid taking a long nap and attempting to complete the pain diary after you wake up. Instead, set your alarm clock to go off every hour, on the hour, for the initial 8 hours after the procedure. Document the duration of the numbing medicine, and the relief you are getting from it. Do not go to sleep and attempt to complete it later. It will not be accurate. If you received sedation, it is likely that you were given a medication that may cause amnesia. Because of this, completing the diary at a later time may cause the information to be inaccurate. This information is needed to plan your care. Follow-up appointment: Keep your post-procedure follow-up evaluation appointment after the procedure (usually 2 weeks for most procedures, 6 weeks for radiofrequencies). DO NOT FORGET to bring you pain diary with you.   EXPECT... (What should I expect to see with my procedure?) From numbing medicine (AKA: Local Anesthetics): Numbness or decrease in pain. You may also experience some weakness, which if present, could last for the duration of the local anesthetic. Onset: Full effect within 15 minutes of injected. Duration: It will depend on the type of local anesthetic used. On the average, 1 to 8 hours.  From steroids (Applies only if steroids were used): Decrease in swelling or inflammation. Once inflammation is improved, relief of the pain will follow. Onset of benefits: Depends on the amount of swelling present. The more swelling, the longer it will take for the benefits to be seen. In some cases, up to 10 days. Duration: Steroids will stay in the system x 2 weeks. Duration of benefits will depend on multiple posibilities including persistent irritating  factors. Side-effects: If present, they may typically last 2 weeks (the duration of the steroids). Frequent: Cramps (if they occur, drink Gatorade and take over-the-counter Magnesium  450-500 mg once to twice a day); water  retention with temporary weight  gain; increases in blood sugar; decreased immune system response; increased appetite. Occasional: Facial flushing (red, warm cheeks); mood swings; menstrual changes. Uncommon: Long-term decrease or suppression of natural hormones; bone thinning. (These are more common with higher doses or more frequent use. This is why we prefer that our patients avoid having any injection therapies in other practices.)  Very Rare: Severe mood changes; psychosis; aseptic necrosis. From procedure: Some discomfort is to be expected once the numbing medicine wears off. This should be minimal if ice and heat are applied as instructed.  CALL IF... (When should I call?) You experience numbness and weakness that gets worse with time, as opposed to wearing off. New onset bowel or bladder incontinence. (Applies only to procedures done in the spine)  Emergency Numbers: Durning business hours (Monday - Thursday, 8:00 AM - 4:00 PM) (Friday, 9:00 AM - 12:00 Noon): [phone] After hours: [phone] NOTE: If you are having a problem and are unable connect with, or to talk to a provider, then go to your nearest urgent care or emergency department. If the problem is serious and urgent, please call 911. ______________________________________________________________________    Epidural Steroid Injection Patient Information  Description: The epidural space surrounds the nerves as they exit the spinal cord.  In some patients, the nerves can be compressed and inflamed by a bulging disc or a tight spinal canal (spinal stenosis).  By injecting steroids into the epidural space, we can bring irritated nerves into direct contact with a potentially helpful medication.  These steroids act directly on the irritated nerves and can reduce swelling and inflammation which often leads to decreased pain.  Epidural steroids may be injected anywhere along the spine and from the neck to the low back depending upon the location of your  pain.   After numbing the skin with local anesthetic (like Novocaine), a small needle is passed into the epidural space slowly.  You may experience a sensation of pressure while this is being done.  The entire block usually last less than 10 minutes.  Conditions which may be treated by epidural steroids:  Low back and leg pain Neck and arm pain Spinal stenosis Post-laminectomy syndrome Herpes zoster (shingles) pain Pain from compression fractures  Preparation for the injection:  Do not eat any solid food or dairy products within 8 hours of your appointment.  You may drink clear liquids up to 3 hours before appointment.  Clear liquids include water , black coffee, juice or soda.  No milk or cream please. You may take your regular medication, including pain medications, with a sip of water  before your appointment  Diabetics should hold regular insulin (if taken separately) and take 1/2 normal NPH dos the morning of the procedure.  Carry some sugar containing items with you to your appointment. A driver must accompany you and be prepared to drive you home after your procedure.  Bring all your current medications with your. An IV may be inserted and sedation may be given at the discretion of the physician.   A blood pressure cuff, EKG and other monitors will often be applied during the procedure.  Some patients may need to have extra oxygen administered for a short period. You will be asked to provide medical information, including your allergies, prior to  the procedure.  We must know immediately if you are taking blood thinners (like Coumadin/Warfarin)  Or if you are allergic to IV iodine contrast (dye). We must know if you could possible be pregnant.  Possible side-effects: Bleeding from needle site Infection (rare, may require surgery) Nerve injury (rare) Numbness & tingling (temporary) Difficulty urinating (rare, temporary) Spinal headache ( a headache worse with upright posture) Light  -headedness (temporary) Pain at injection site (several days) Decreased blood pressure (temporary) Weakness in arm/leg (temporary) Pressure sensation in back/neck (temporary)  Call if you experience: Fever/chills associated with headache or increased back/neck pain. Headache worsened by an upright position. New onset weakness or numbness of an extremity below the injection site Hives or difficulty breathing (go to the emergency room) Inflammation or drainage at the infection site Severe back/neck pain Any new symptoms which are concerning to you  Please note:  Although the local anesthetic injected can often make your back or neck feel good for several hours after the injection, the pain will likely return.  It takes 3-7 days for steroids to work in the epidural space.  You may not notice any pain relief for at least that one week.  If effective, we will often do a series of three injections spaced 3-6 weeks apart to maximally decrease your pain.  After the initial series, we generally will wait several months before considering a repeat injection of the same type.  If you have any questions, please call [phone] [facility]

## 2024-10-09 ENCOUNTER — Telehealth: Payer: Self-pay | Admitting: *Deleted

## 2024-10-09 NOTE — Telephone Encounter (Signed)
 Post procedure call:   no  questions or concerns.

## 2024-10-11 ENCOUNTER — Other Ambulatory Visit: Payer: Self-pay

## 2024-10-11 ENCOUNTER — Emergency Department
Admission: EM | Admit: 2024-10-11 | Discharge: 2024-10-11 | Disposition: A | Discharge status: Home / Self Care | Attending: Emergency Medicine | Admitting: Emergency Medicine

## 2024-10-11 DIAGNOSIS — J45909 Unspecified asthma, uncomplicated: Secondary | ICD-10-CM | POA: Diagnosis not present

## 2024-10-11 DIAGNOSIS — I10 Essential (primary) hypertension: Secondary | ICD-10-CM | POA: Insufficient documentation

## 2024-10-11 DIAGNOSIS — R300 Dysuria: Secondary | ICD-10-CM | POA: Diagnosis present

## 2024-10-11 DIAGNOSIS — N390 Urinary tract infection, site not specified: Secondary | ICD-10-CM | POA: Diagnosis not present

## 2024-10-11 LAB — URINALYSIS, ROUTINE W REFLEX MICROSCOPIC
Bacteria, UA: NONE SEEN
Bilirubin Urine: NEGATIVE
Glucose, UA: NEGATIVE mg/dL
Ketones, ur: NEGATIVE mg/dL
Nitrite: NEGATIVE
Protein, ur: 100 mg/dL — AB
RBC / HPF: >50 RBC/hpf (ref 0–5)
Specific Gravity, Urine: 1.016 (ref 1.005–1.030)
Squamous Epithelial / HPF: 0 /HPF (ref 0–5)
WBC, UA: >50 WBC/hpf (ref 0–5)
pH: 5 (ref 5.0–8.0)

## 2024-10-11 MED ORDER — PHENAZOPYRIDINE HCL 200 MG PO TABS
200.0000 mg | ORAL_TABLET | Freq: Once | ORAL | Status: AC
  Administered 2024-10-11: 200 mg via ORAL
  Filled 2024-10-11: qty 1

## 2024-10-11 MED ORDER — CEPHALEXIN 500 MG PO CAPS
500.0000 mg | ORAL_CAPSULE | Freq: Once | ORAL | Status: AC
  Administered 2024-10-11: 500 mg via ORAL
  Filled 2024-10-11: qty 1

## 2024-10-11 MED ORDER — CEPHALEXIN 500 MG PO CAPS: 500.0000 mg | ORAL_CAPSULE | Freq: Three times a day (TID) | ORAL | 0 refills | Status: DC

## 2024-10-11 MED ORDER — PHENAZOPYRIDINE HCL 200 MG PO TABS: 200.0000 mg | ORAL_TABLET | Freq: Three times a day (TID) | ORAL | 0 refills | Status: DC | PRN

## 2024-10-11 NOTE — ED Triage Notes (Signed)
 Blood in urine and pain to groin.  Blood in urine since last night and pain to goin since Thursday.  Had steroid injections to back on Wednesday.  No pain with urination, but c/o urgency and frequency.

## 2024-10-11 NOTE — Discharge Instructions (Addendum)
 Please take your medications as prescribed.  Return to the emergency department for any worsening pain, inability to urinate, development of fever or any other symptom personally concerning to yourself.

## 2024-10-11 NOTE — ED Provider Notes (Signed)
   [facility] Provider Note    Event Date/Time   First MD Initiated Contact with Patient 10/11/24 1111     (approximate)  History   Chief Complaint: Dysuria  HPI  OZELL JUHASZ is a 58 y.o. male with a past medical history of anxiety, asthma, gastric reflux, hypertension, prostatectomy, presents to the emergency department for difficulty urinating/dysuria.  According to the patient he had lumbar injections performed last week, starting on Thursday or Friday he began experiencing some discomfort in the lower abdomen and some difficulty urinating.  He thought this was due to the recent surgery.  No fever.  He has been experiencing a burning type symptom with urinating.  Has only been able to urinate small amounts.  No penile discharge.  Physical Exam   Triage Vital Signs: ED Triage Vitals  Encounter Vitals Group     BP 10/11/24 1029 (!) 155/103     Girls Systolic BP Percentile --      Girls Diastolic BP Percentile --      Boys Systolic BP Percentile --      Boys Diastolic BP Percentile --      Pulse Rate 10/11/24 1029 96     Resp 10/11/24 1029 18     Temp 10/11/24 1029 98.9 F (37.2 C)     Temp Source 10/11/24 1029 Oral     SpO2 10/11/24 1029 100 %     Weight 10/11/24 1029 220 lb (99.8 kg)     Height 10/11/24 1029 5' 9 (1.753 m)     Head Circumference --      Peak Flow --      Pain Score 10/11/24 1033 6     Pain Loc --      Pain Education --      Exclude from Growth Chart --     Most recent vital signs: Vitals:   10/11/24 1029  BP: (!) 155/103  Pulse: 96  Resp: 18  Temp: 98.9 F (37.2 C)  SpO2: 100%    General: Awake, no distress.  CV:  Good peripheral perfusion.  Regular rate and rhythm  Resp:  Normal effort.  Equal breath sounds bilaterally.  Abd:  No distention.  Soft, minimal suprapubic tenderness.   ED Results / Procedures / Treatments   MEDICATIONS ORDERED IN ED: Medications - No data to display   IMPRESSION / MDM /  ASSESSMENT AND PLAN / ED COURSE  I reviewed the triage vital signs and the nursing notes.  Patient's presentation is most consistent with acute presentation with potential threat to life or bodily function.  Patient presents to the emergency department for dysuria and urinary urgency.  Very slight suprapubic tenderness otherwise benign abdomen.  Patient's vital signs are reassuring besides mild hypertension.  Patient's urinalysis shows greater than 50 red cells white cells and white blood cell clumps present indicative of a urinary tract infection.  Will have the patient voided and obtain a postvoid bladder scan.  Patient will require antibiotics if he is retaining a significant amount of urine he may require a catheter as well.  No discharge or other signs of STD/urethritis  Bladder scan is 0.  Will place the patient on Keflex  and Pyridium  and have him follow-up with his PCP.  FINAL CLINICAL IMPRESSION(S) / ED DIAGNOSES   Urinary tract infection   Note:  This document was prepared using Dragon voice recognition software and may include unintentional dictation errors.   Dorothyann Drivers, MD 10/11/24 1159

## 2024-10-13 NOTE — Progress Notes (Deleted)
 Referring Physician:  Theophilus Pagan, MD [address] [place],  [place] [address]  Primary Physician:  Theophilus Pagan, MD  History of Present Illness: [name] has a history of HTN, gastric ulcer, osteoporosis, compression fracture T12, prostate CA, gout, B12 deficiency, asthma, ETOH abuse, history of chronic steroid use.   He has been sober from ETOH for about a year.   He only has 1 kidney.   He was having neck, thoracic, and LBP with pain in arms and legs. He was having balance and dexterity issues with unsteady gait.   He has mild central stenosis C3-C7 with mild right foraminal stenosis C5-C6 and mild left foraminal stenosis C6-C7. No significant cord compression.   He has mild central and bilateral foraminal stenosis T11-T12, mild left and moderate/severe right foraminal stenosis T8-T9, mild bilateral foraminal stenosis T10-T11, and chronic compression fractures at T10-T12.   He has known lumbar spondylosis with mild bilateral lateral recess stenosis L2-L3.   No spinal cause of balance/dexterity issues or unsteady gait. Dr. Leigh referred him to ENT.   He was sent to PT- did not go.   He was sent to pain management- he had left T10-T11 IL thoracic injection on 10/08/24.   He is here for follow up.              Primary complaint today is more constant mid to lower thoracic pain with some radiation into his ribs. This is worse with prolonged standing and walking.  Some relief with laying down.    He still has constant neck pain with constant numbness, tingling, and weakness in both arms. He has intermittent bilateral arm pain mostly to elbow, but sometimes to his wrist. Pain is worse with lifting and using his arms.    He also has intermittent LBP with constant pain in bilateral anterior thigh pain and lateral hips. Occasional pain past his knees. He continues with numbness, tingling, and weakness. Pain is worse with prolonged standing/sitting/walking.  Some relief with laying down.   He continues with balance/dexterity issues and an unsteady gait.    Evaluated by neurology on 06/26/24 [facility]). Labs ordered along with MRI of brain and cervical spine.   Seen in ED on 07/01/24 for back and flank pain into his left foot. He has history of chronic LBP.   He has constant LBP with bilateral buttock pain and bilateral posterior leg pain to his feet with numbness, tingling, and weakness. Pain is worse with prolonged standing/sitting/walking. Hard for him to stand/cook due to pain- he has lower thoracic pain with some radiation into his ribs. Some relief with laying down.  He also has constant neck pain with constant numbness, tingling, and weakness in both arms. He has intermittent bilateral arm pain mostly to elbow, but sometimes to his wrist. Pain is worse with lifting and using his arms.   Tobacco use:  Does not smoke. He does nicotine  pouches- he does 4 a day.   Bowel/Bladder Dysfunction: had prostate removed last July- he's had some urinary leaking and urgency since that time. No issues with his bowels.   Conservative measures:  Physical therapy: did pelvic floor PT in last year.  Multimodal medical therapy including regular antiinflammatories: tylenol , flexeril  Injections:  no epidural steroid injections for his neck, he has had some for his back in the early 2000s and in 2021.    Past Surgery: no spinal surgeries  Frank Terry has dexterity issues. He thinks his balance is getting worse.  The symptoms are causing a significant impact on the patient's life.   Review of Systems:  A 10 point review of systems is negative, except for the pertinent positives and negatives detailed in the HPI.  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   Depression    Gastric ulcer    GERD (gastroesophageal reflux disease)    no meds   Gout    HTN (hypertension)    Hyponatremia    Osteoporosis    Prostate cancer (HCC)    Ulcer    Vasitis 05/2023    Past Surgical History: Past Surgical History:  Procedure Laterality Date   COLONOSCOPY WITH ESOPHAGOGASTRODUODENOSCOPY (EGD)     MASS EXCISION Left 09/19/2023   Procedure: EXCISION MASS, deep soft tissue mass back;  Surgeon: Lane Shope, MD;  Location: [facility] ORS;  Service: General;  Laterality: Left;   PELVIC LYMPH NODE DISSECTION Bilateral 06/18/2023   Procedure: PELVIC LYMPH NODE DISSECTION;  Surgeon: Penne Knee, MD;  Location: [facility] ORS;  Service: Urology;  Laterality: Bilateral;   PROSTATE BIOPSY     ROBOT ASSISTED LAPAROSCOPIC RADICAL PROSTATECTOMY N/A 06/18/2023   Procedure: XI ROBOTIC ASSISTED LAPAROSCOPIC RADICAL PROSTATECTOMY;  Surgeon: Penne Knee, MD;  Location: [facility] ORS;  Service: Urology;  Laterality: N/A;   TONSILLECTOMY     age 58    Allergies: Allergies as of 10/22/2024 - Review Complete 10/11/2024  Allergen Reaction Noted   Penicillins Rash 05/07/2022    Medications: Outpatient Encounter Medications as of 10/22/2024  Medication Sig   acetaminophen  (TYLENOL ) 500 MG tablet Take 1,000 mg by mouth every 6 (six) hours as needed.   albuterol  (VENTOLIN  HFA) 108 (90 Base) MCG/ACT inhaler Inhale 2 puffs into the lungs every 6 (six) hours as needed for wheezing or shortness of breath.   cephALEXin  (KEFLEX ) 500 MG capsule Take 1 capsule (500 mg total) by mouth 3 (three) times daily.   cetirizine (ZYRTEC) 10 MG tablet Take 10 mg by mouth daily. Unsure of dose   colchicine  0.6 MG tablet Take 0.6 mg by mouth as needed.   fluticasone -salmeterol (ADVAIR HFA) 230-21 MCG/ACT inhaler Inhale 2 puffs into the lungs 2 (two) times daily.   irbesartan  (AVAPRO ) 150 MG tablet Take 1 tablet (150 mg total) by mouth daily.    nortriptyline  (PAMELOR ) 25 MG capsule Take 2 capsules (50 mg total) by mouth at bedtime.   phenazopyridine  (PYRIDIUM ) 200 MG tablet Take 1 tablet (200 mg total) by mouth 3 (three) times daily as needed for pain.   rizatriptan  (MAXALT ) 10 MG tablet Take 1 tablet (10 mg total) by mouth as needed for migraine. May repeat in 2 hours if needed   sildenafil  (VIAGRA ) 100 MG tablet Take 1 tablet (100 mg total) by mouth daily as needed for erectile dysfunction. (Patient not taking: Reported on 09/30/2024)   No facility-administered encounter medications on file as of 10/22/2024.    Social History: Social History   Tobacco Use   Smoking status: Former    Types: Cigarettes    Start date: 1984   Smokeless tobacco: Current   Tobacco comments:    Nicotine  patches   Building Services Engineer  status: Never Used  Substance Use Topics   Alcohol use: Not Currently    Alcohol/week: 168.0 standard drinks of alcohol    Types: 168 Cans of beer per week    Comment: pt lives in the [facility] for alcohol addiction   Drug use: Not Currently    Family Medical History: Family History  Problem Relation Age of Onset   Hypertension Mother    Lung cancer Mother    Osteoporosis Mother    Alcohol abuse Mother    Arthritis Mother    Cancer Mother    Heart disease Mother    Hypertension Father    Cancer Father        Abdominal   Heart attack Father    Hypertension Sister    Multiple sclerosis Sister     Physical Examination: There were no vitals filed for this visit.    Awake, Terry, oriented to person, place, and time.  Speech is clear and fluent. Fund of knowledge is appropriate.   Cranial Nerves: Pupils equal round and reactive to light.  Facial tone is symmetric.    No abnormal lesions on exposed skin.   Strength: Side Biceps Triceps Deltoid Interossei Grip Wrist Ext. Wrist Flex.  R 5 5 5 5 5 5 5   L 5 5 5 5 5 5 5    Side Iliopsoas Quads Hamstring PF DF EHL  R 5 5 5 4 5 5   L 5 5 5 4 5 5     Reflexes are 2+ and symmetric at the biceps, brachioradialis, and achilles, absent at right achilles. 3+ at the patella.      Hoffman's is absent.  Clonus is not present.    Bilateral upper and lower extremity sensation is intact to light touch.     No pain with IR/ER of both hips.   He has unsteady gait. He can heel stand on both feet. He has difficulty toe standing on both right and left.   Medical Decision Making  Imaging: Lumbar MRI dated 07/01/24:  FINDINGS:   BONES AND ALIGNMENT: Moderate chronic compression deformity of T12, unchanged from prior study. The vertebral body has lost about 50% of its height anteriorly and there is a chronic schmorl's node within the superior endplate. There is chronic downward bowing of the superior endplates of L1 and L2, as before.   SPINAL CORD: The conus medullaris terminates at L1.   SOFT TISSUES: No paraspinal mass.   L1-L2: No significant disc herniation. No spinal canal stenosis or neural foraminal narrowing.   L2-L3: Mild diffuse disc bulging with mild bilateral lateral recess stenosis.   L3-L4: Slight disc bulging without significant spinal canal or neural foraminal stenosis.   L4-L5: No significant disc herniation. No spinal canal stenosis or neural foraminal narrowing.   L5-S1: Slight disc bulging without significant spinal canal or neural foraminal stenosis.   OTHER FINDINGS: The left kidney is not visualized.   IMPRESSION: 1. No evidence of cauda equina syndrome. 2. Moderate chronic compression deformity of T12, unchanged. 3. Chronic downward bowing of the superior endplates of L1 and L2, as before. 4. Mild diffuse disc bulging at L2-3 with mild bilateral lateral recess stenosis. 5. Slight disc bulging at L3-4 and L5-S1 without significant spinal canal or neural foraminal stenosis.   Electronically signed by: evalene coho 07/01/2024 02:59 PM EDT RP Workstation: [id number]  I have personally  reviewed the images and agree with the above interpretation.  Assessment and Plan: [name] has constant LBP with bilateral buttock  pain and bilateral posterior leg pain to his feet with numbness, tingling, and weakness. Pain is worse with prolonged standing/sitting/walking.  He has known lumbar spondylosis with mild bilateral lateral recess stenosis L2-L3.   It is hard for him to stand/cook due to pain- he has lower thoracic pain with some radiation into his ribs. Some relief with laying down.  No thoracic imaging.   He also has constant neck pain with constant numbness, tingling, and weakness in both arms. He has intermittent bilateral arm pain mostly to elbow, but sometimes to his wrist.   He has dexterity and balance issues. He has unsteady gait. He is hyper reflexic at patella bilaterally. This is all suspicious for cervical stenosis. He has no current cervical imaging.   Treatment options discussed with patient and following plan made:   - Agree with MRI of cervical spine as ordered by neurology. Message to Dr. Leigh about getting this scheduled. He also ordered MRI of brain.  - MRI of thoracic spine ordered to evaluate thoracic pain and unsteady gait.  - Depending on MRI results, may consider EMG of bilateral lower extremities.  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  - Once I have MRI results, will schedule MyChart visit to review them.   BP was slightly elevated. No symptoms of chest pain, shortness of breath, blurry vision, or headaches. Will recheck at home and call PCP if not improved. If he develops CP, SOB, blurry vision, or headaches, then he will go to ED.     I spent a total of 45 minutes in face-to-face and non-face-to-face activities related to this patient's care today including review of outside records, review of imaging, review of symptoms, physical exam, discussion of differential diagnosis, discussion of treatment options, and documentation.   Thank you  for involving me in the care of this patient.   Glade Boys PA-C Dept. of Neurosurgery

## 2024-10-22 ENCOUNTER — Other Ambulatory Visit: Payer: Self-pay

## 2024-10-22 ENCOUNTER — Ambulatory Visit: Admitting: Orthopedic Surgery

## 2024-10-22 ENCOUNTER — Encounter (HOSPITAL_BASED_OUTPATIENT_CLINIC_OR_DEPARTMENT_OTHER): Payer: Self-pay

## 2024-10-22 NOTE — Progress Notes (Signed)
   10/22/24 1339  Pre-op Phone Call  Surgery Date Verified 10/29/24  Arrival Time Verified 1300  Surgery Location Verified [facility]  Medical History Reviewed Yes  Is the patient taking a GLP-1 receptor agonist? No  Does the patient have diabetes? No diagnosis of diabetes  Do you have a history of heart problems? No  Does patient have other implanted devices? No  Patient Teaching Pre / Post Procedure  Patient educated about smoking cessation 24 hours prior to surgery. N/A Non-Smoker  Patient verbalizes understanding of bowel prep? N/A  Med Rec Completed Yes  Take the Following Meds the Morning of Surgery prilosec with sip water ; hold nsaid/herb/supp x5d  Recent  Lab Work, EKG, CXR? Yes (EKG 07-2024)  NPO (Including gum & candy) After midnight  Allowed clear liquids Water ;Gatorade  (diabetics please choose diet or no sugar options) (water  or gatorade at 0500)  Stop Solids, Milk, Candy, and Gum STARTING AT MIDNIGHT  Responsible adult to drive and be with you for 24 hours? Yes  Name & Phone Number for Ride/Caregiver daughter drop off; norleen transport home; friends at home 24 hr care  No Jewelry, money, nail polish or make-up.  No lotions, powders, perfumes. No shaving  48 hrs. prior to surgery. Yes  Contacts, Dentures & Glasses Will Have to be Removed Before OR. Yes  Please bring your ID and Insurance Card the morning of your surgery. (Surgery Centers Only) Yes  Bring any papers or x-rays with you that your surgeon gave you. Yes  Instructed to contact the location of procedure/ provider if they or anyone in their household develops symptoms or tests positive for COVID-19, has close contact with someone who tests positive for COVID, or has known exposure to any contagious illness. Yes  Call this number the morning of surgery  with any problems that may cancel your surgery. [phone]  Covid-19 Assessment  Have you had a positive COVID-19 test within the previous 90 days? No  COVID Testing  Guidance Proceed with the additional questions.  Have you been unmasked and in close contact with anyone with COVID-19 or COVID-19 symptoms within the past 10 days? No  Do you or anyone in your household currently have any COVID-19 symptoms? No

## 2024-10-29 ENCOUNTER — Ambulatory Visit (HOSPITAL_BASED_OUTPATIENT_CLINIC_OR_DEPARTMENT_OTHER): Admission: RE | Admit: 2024-10-29 | Discharge: 2024-10-29 | Disposition: A

## 2024-10-29 ENCOUNTER — Other Ambulatory Visit: Payer: Self-pay

## 2024-10-29 ENCOUNTER — Ambulatory Visit (HOSPITAL_BASED_OUTPATIENT_CLINIC_OR_DEPARTMENT_OTHER): Admitting: Anesthesiology

## 2024-10-29 ENCOUNTER — Encounter (HOSPITAL_BASED_OUTPATIENT_CLINIC_OR_DEPARTMENT_OTHER): Admission: RE | Disposition: A | Payer: Self-pay | Source: Home / Self Care

## 2024-10-29 DIAGNOSIS — H903 Sensorineural hearing loss, bilateral: Secondary | ICD-10-CM | POA: Insufficient documentation

## 2024-10-29 DIAGNOSIS — H9313 Tinnitus, bilateral: Secondary | ICD-10-CM | POA: Insufficient documentation

## 2024-10-29 DIAGNOSIS — R220 Localized swelling, mass and lump, head: Secondary | ICD-10-CM

## 2024-10-29 DIAGNOSIS — K219 Gastro-esophageal reflux disease without esophagitis: Secondary | ICD-10-CM | POA: Diagnosis not present

## 2024-10-29 DIAGNOSIS — I10 Essential (primary) hypertension: Secondary | ICD-10-CM | POA: Diagnosis not present

## 2024-10-29 DIAGNOSIS — Z87891 Personal history of nicotine dependence: Secondary | ICD-10-CM | POA: Diagnosis not present

## 2024-10-29 DIAGNOSIS — R519 Headache, unspecified: Secondary | ICD-10-CM | POA: Diagnosis not present

## 2024-10-29 DIAGNOSIS — Q600 Renal agenesis, unilateral: Secondary | ICD-10-CM | POA: Insufficient documentation

## 2024-10-29 DIAGNOSIS — R42 Dizziness and giddiness: Secondary | ICD-10-CM | POA: Diagnosis not present

## 2024-10-29 DIAGNOSIS — H5712 Ocular pain, left eye: Secondary | ICD-10-CM | POA: Insufficient documentation

## 2024-10-29 DIAGNOSIS — J45909 Unspecified asthma, uncomplicated: Secondary | ICD-10-CM

## 2024-10-29 DIAGNOSIS — D170 Benign lipomatous neoplasm of skin and subcutaneous tissue of head, face and neck: Secondary | ICD-10-CM | POA: Insufficient documentation

## 2024-10-29 HISTORY — PX: EXCISION MASS HEAD: SHX6702

## 2024-10-29 SURGERY — EXCISION, MASS, HEAD
Anesthesia: General | Site: Face | Laterality: Left

## 2024-10-29 MED ORDER — ACETAMINOPHEN 10 MG/ML IV SOLN: 1000.0000 mg | Freq: Once | INTRAVENOUS | Status: DC | PRN

## 2024-10-29 MED ORDER — FENTANYL CITRATE (PF) 100 MCG/2ML IJ SOLN
25.0000 ug | INTRAMUSCULAR | Status: DC | PRN
  Administered 2024-10-29 (×2): 50 ug via INTRAVENOUS

## 2024-10-29 MED ORDER — DEXAMETHASONE SOD PHOSPHATE PF 10 MG/ML IJ SOLN
INTRAMUSCULAR | Status: DC | PRN
  Administered 2024-10-29: 5 mg via INTRAVENOUS

## 2024-10-29 MED ORDER — DEXMEDETOMIDINE HCL IN NACL 80 MCG/20ML IV SOLN
INTRAVENOUS | Status: DC | PRN
  Administered 2024-10-29 (×2): 8 ug via INTRAVENOUS

## 2024-10-29 MED ORDER — FENTANYL CITRATE (PF) 100 MCG/2ML IJ SOLN
INTRAMUSCULAR | Status: DC | PRN
  Administered 2024-10-29: 100 ug via INTRAVENOUS

## 2024-10-29 MED ORDER — MIDAZOLAM HCL 2 MG/2ML IJ SOLN
INTRAMUSCULAR | Status: AC
  Filled 2024-10-29: qty 2

## 2024-10-29 MED ORDER — PROPOFOL 10 MG/ML IV BOLUS
INTRAVENOUS | Status: DC | PRN
  Administered 2024-10-29: 200 mg via INTRAVENOUS

## 2024-10-29 MED ORDER — CEPHALEXIN 250 MG/5ML PO SUSR: 500.0000 mg | Freq: Three times a day (TID) | ORAL | 0 refills | Status: DC

## 2024-10-29 MED ORDER — LACTATED RINGERS IV SOLN: INTRAVENOUS | Status: DC | PRN

## 2024-10-29 MED ORDER — ONDANSETRON HCL 4 MG/2ML IJ SOLN
INTRAMUSCULAR | Status: DC | PRN
  Administered 2024-10-29: 4 mg via INTRAVENOUS

## 2024-10-29 MED ORDER — PHENYLEPHRINE 80 MCG/ML (10ML) SYRINGE FOR IV PUSH (FOR BLOOD PRESSURE SUPPORT)
PREFILLED_SYRINGE | INTRAVENOUS | Status: AC
  Filled 2024-10-29: qty 10

## 2024-10-29 MED ORDER — ONDANSETRON HCL 4 MG/2ML IJ SOLN: 4.0000 mg | Freq: Once | INTRAMUSCULAR | Status: DC | PRN

## 2024-10-29 MED ORDER — CEFAZOLIN SODIUM-DEXTROSE 2-3 GM-%(50ML) IV SOLR
INTRAVENOUS | Status: DC | PRN
  Administered 2024-10-29: 2 g via INTRAVENOUS

## 2024-10-29 MED ORDER — FENTANYL CITRATE (PF) 100 MCG/2ML IJ SOLN
INTRAMUSCULAR | Status: AC
  Filled 2024-10-29: qty 2

## 2024-10-29 MED ORDER — OXYCODONE HCL 5 MG PO TABS
ORAL_TABLET | ORAL | Status: AC
  Filled 2024-10-29: qty 1

## 2024-10-29 MED ORDER — OXYCODONE HCL 5 MG PO TABS
5.0000 mg | ORAL_TABLET | Freq: Once | ORAL | Status: AC | PRN
  Administered 2024-10-29: 5 mg via ORAL

## 2024-10-29 MED ORDER — OXYCODONE HCL 5 MG/5ML PO SOLN: 5.0000 mg | Freq: Once | ORAL | Status: AC | PRN

## 2024-10-29 MED ORDER — BACITRACIN ZINC 500 UNIT/GM EX OINT
TOPICAL_OINTMENT | CUTANEOUS | Status: DC | PRN
  Administered 2024-10-29: 1 via TOPICAL

## 2024-10-29 MED ORDER — LACTATED RINGERS IV SOLN: INTRAVENOUS | Status: DC

## 2024-10-29 MED ORDER — LIDOCAINE-EPINEPHRINE 1 %-1:100000 IJ SOLN
INTRAMUSCULAR | Status: DC | PRN
  Administered 2024-10-29: 2 mL

## 2024-10-29 MED ORDER — MIDAZOLAM HCL (PF) 2 MG/2ML IJ SOLN
INTRAMUSCULAR | Status: DC | PRN
  Administered 2024-10-29: 2 mg via INTRAVENOUS

## 2024-10-29 MED ORDER — LIDOCAINE 2% (20 MG/ML) 5 ML SYRINGE
INTRAMUSCULAR | Status: DC | PRN
  Administered 2024-10-29: 100 mg via INTRAVENOUS

## 2024-10-29 SURGICAL SUPPLY — 65 items
APPLICATOR COTTON TIP 6 STRL (MISCELLANEOUS) IMPLANT
BENZOIN TINCTURE PRP APPL 2/3 (GAUZE/BANDAGES/DRESSINGS) IMPLANT
BLADE SURG 15 STRL LF DISP TIS (BLADE) ×1 IMPLANT
BNDG STRETCH GAUZE 3IN X12FT (GAUZE/BANDAGES/DRESSINGS) IMPLANT
CANISTER SUCT 1200ML W/VALVE (MISCELLANEOUS) ×1 IMPLANT
CLEANER CAUTERY TIP PAD (MISCELLANEOUS) IMPLANT
CLIP TI MEDIUM 6 (CLIP) IMPLANT
CLIP TI WIDE RED SMALL 6 (CLIP) IMPLANT
CORD BIPOLAR FORCEPS 12FT (ELECTRODE) ×1 IMPLANT
COVER BACK TABLE 60X90IN (DRAPES) ×1 IMPLANT
COVER MAYO STAND STRL (DRAPES) ×1 IMPLANT
DERMABOND ADVANCED .7 DNX12 (GAUZE/BANDAGES/DRESSINGS) IMPLANT
DRAIN 10X20 FULL PER LF SIL ST (DRAIN) IMPLANT
DRAIN CHANNEL 7F 3/4 FLAT (WOUND CARE) IMPLANT
DRAIN CHANNEL 7F FF FLAT (WOUND CARE) IMPLANT
DRAIN PENROSE 12X.25 LTX STRL (MISCELLANEOUS) IMPLANT
DRAIN WOUND RND W/TROCAR (DRAIN) IMPLANT
DRAPE INCISE 23X17 STRL (DRAPES) ×1 IMPLANT
DRAPE U-SHAPE 76X120 STRL (DRAPES) ×1 IMPLANT
ELECT COATED BLADE 2.86 ST (ELECTRODE) ×1 IMPLANT
ELECTRODE PAIRED SUBDERMAL (MISCELLANEOUS) IMPLANT
ELECTRODE REM PT RTRN 9FT ADLT (ELECTROSURGICAL) ×1 IMPLANT
EVACUATOR 3/16 PVC DRAIN (DRAIN) IMPLANT
EVACUATOR SILICONE 100CC (DRAIN) IMPLANT
FORCEPS BIPOLAR SPETZLER 8 1.0 (NEUROSURGERY SUPPLIES) ×1 IMPLANT
GAUZE 4X4 16PLY ~~LOC~~+RFID DBL (SPONGE) IMPLANT
GAUZE SPONGE 4X4 12PLY STRL (GAUZE/BANDAGES/DRESSINGS) IMPLANT
GLOVE ECLIPSE 8.0 STRL XLNG CF (GLOVE) ×1 IMPLANT
GOWN STRL REUS W/ TWL LRG LVL3 (GOWN DISPOSABLE) ×1 IMPLANT
GOWN STRL REUS W/ TWL XL LVL3 (GOWN DISPOSABLE) ×1 IMPLANT
LOCATOR NERVE 3 VOLT (DISPOSABLE) IMPLANT
NDL HYPO 25X1 1.5 SAFETY (NEEDLE) ×1 IMPLANT
NEEDLE HYPO 25X1 1.5 SAFETY (NEEDLE) ×1 IMPLANT
PACK BASIN DAY SURGERY FS (CUSTOM PROCEDURE TRAY) ×1 IMPLANT
PENCIL SMOKE EVACUATOR (MISCELLANEOUS) ×1 IMPLANT
PIN SAFETY STERILE (MISCELLANEOUS) IMPLANT
PROBE NERVBE PRASS .33 (MISCELLANEOUS) IMPLANT
SLEEVE SCD COMPRESS KNEE MED (STOCKING) ×1 IMPLANT
SOLN 0.9% NACL POUR BTL 1000ML (IV SOLUTION) ×1 IMPLANT
SPIKE FLUID TRANSFER (MISCELLANEOUS) IMPLANT
STAPLER SKIN PROX WIDE 3.9 (STAPLE) IMPLANT
STRIP CLOSURE SKIN 1/4X4 (GAUZE/BANDAGES/DRESSINGS) IMPLANT
SUCTION TUBE FRAZIER 10FR DISP (SUCTIONS) IMPLANT
SUT ETHILON 3 0 PS 1 (SUTURE) IMPLANT
SUT ETHILON 4 0 PS 2 18 (SUTURE) IMPLANT
SUT MNCRL AB 3-0 PS2 27 (SUTURE) IMPLANT
SUT MNCRL AB 4-0 PS2 18 (SUTURE) IMPLANT
SUT MON AB 4-0 PC3 18 (SUTURE) IMPLANT
SUT MON AB 5-0 P3 18 (SUTURE) IMPLANT
SUT NYLON ETHILON 5-0 P-3 1X18 (SUTURE) IMPLANT
SUT PROLENE 4 0 P 3 18 (SUTURE) IMPLANT
SUT PROLENE 5 0 P 3 (SUTURE) IMPLANT
SUT SILK 2 0 TIES 17X18 (SUTURE) IMPLANT
SUT SILK 3 0 PS 1 (SUTURE) IMPLANT
SUT SILK 3 0 REEL (SUTURE) ×1 IMPLANT
SUT SILK 4 0 TIES 17X18 (SUTURE) ×1 IMPLANT
SUT VIC AB 3-0 FS2 27 (SUTURE) IMPLANT
SUT VIC AB 4-0 P-3 18XBRD (SUTURE) IMPLANT
SUT VIC AB 4-0 PS2 18 (SUTURE) IMPLANT
SYR BULB EAR ULCER 3OZ GRN STR (SYRINGE) IMPLANT
SYR CONTROL 10ML LL (SYRINGE) ×1 IMPLANT
TOWEL GREEN STERILE FF (TOWEL DISPOSABLE) ×1 IMPLANT
TRAY DSU PREP LF (CUSTOM PROCEDURE TRAY) ×1 IMPLANT
TUBE CONNECTING 20X1/4 (TUBING) ×1 IMPLANT
YANKAUER SUCT BULB TIP NO VENT (SUCTIONS) IMPLANT

## 2024-10-29 NOTE — Discharge Instructions (Addendum)
 Okay to apply ice Sleep with head elevated Take ibuprofen  and tylenol  for pain. Avoid elevated heart rate and blood pressure. No vigorous exercise No rubbing forehead Okay for water  to run over incision. Do not submerge.    Post Anesthesia Home Care Instructions  Activity: Get plenty of rest for the remainder of the day. A responsible individual must stay with you for 24 hours following the procedure.  For the next 24 hours, DO NOT: -Drive a car -Advertising copywriter -Drink alcoholic beverages -Take any medication unless instructed by your physician -Make any legal decisions or sign important papers.  Meals: Start with liquid foods such as gelatin or soup. Progress to regular foods as tolerated. Avoid greasy, spicy, heavy foods. If nausea and/or vomiting occur, drink only clear liquids until the nausea and/or vomiting subsides. Call your physician if vomiting continues.  Special Instructions/Symptoms: Your throat may feel dry or sore from the anesthesia or the breathing tube placed in your throat during surgery. If this causes discomfort, gargle with warm salt water . The discomfort should disappear within 24 hours.  If you had a scopolamine patch placed behind your ear for the management of post- operative nausea and/or vomiting:  1. The medication in the patch is effective for 72 hours, after which it should be removed.  Wrap patch in a tissue and discard in the trash. Wash hands thoroughly with soap and water . 2. You may remove the patch earlier than 72 hours if you experience unpleasant side effects which may include dry mouth, dizziness or visual disturbances. 3. Avoid touching the patch. Wash your hands with soap and water  after contact with the patch.

## 2024-10-29 NOTE — Transfer of Care (Signed)
 Immediate Anesthesia Transfer of Care Note  Patient: Frank Terry Alert  Procedure(s) Performed: Facial mass excision (Left: Face)  Patient Location: PACU  Anesthesia Type:General  Level of Consciousness: awake, alert , and patient cooperative  Airway & Oxygen Therapy: Patient Spontanous Breathing and Patient connected to face mask oxygen  Post-op Assessment: Report given to RN and Post -op Vital signs reviewed and stable  Post vital signs: Reviewed and stable  Last Vitals:  Vitals Value Taken Time  BP 123/86 10/29/24 14:24  Temp    Pulse 98 10/29/24 14:26  Resp 13 10/29/24 14:26  SpO2 96 % 10/29/24 14:26  Vitals shown include unfiled device data.  Last Pain:  Vitals:   10/29/24 1120  TempSrc: Temporal  PainSc: 0-No pain      Patients Stated Pain Goal: 3 (10/29/24 1120)  Complications: No notable events documented.

## 2024-10-29 NOTE — Anesthesia Procedure Notes (Signed)
 Procedure Name: LMA Insertion Date/Time: 10/29/2024 1:23 PM  Performed by: Burnard Rosaline HERO, CRNAPre-anesthesia Checklist: Patient identified, Emergency Drugs available, Suction available and Patient being monitored Patient Re-evaluated:Patient Re-evaluated prior to induction Oxygen Delivery Method: Circle system utilized Preoxygenation: Pre-oxygenation with 100% oxygen Induction Type: IV induction Ventilation: Mask ventilation without difficulty LMA: LMA inserted LMA Size: 5.0 Number of attempts: 1 Airway Equipment and Method: Bite block Placement Confirmation: positive ETCO2, breath sounds checked- equal and bilateral and CO2 detector Tube secured with: Tape Dental Injury: Teeth and Oropharynx as per pre-operative assessment

## 2024-10-29 NOTE — Anesthesia Preprocedure Evaluation (Addendum)
 Anesthesia Evaluation  Patient identified by MRN, date of birth, ID band Patient awake    Reviewed: Allergy & Precautions, NPO status , Patient's Chart, lab work & pertinent test results, reviewed documented beta blocker date and time   History of Anesthesia Complications Negative for: history of anesthetic complications  Airway Mallampati: II  TM Distance: >3 FB    Comment: Previous grade II view with MAC 3, easy mask Dental no notable dental hx.    Pulmonary asthma , neg COPD, former smoker   breath sounds clear to auscultation       Cardiovascular hypertension (irbesartan ), Pt. on medications (-) CAD, (-) Past MI, (-) Cardiac Stents, (-) CABG, (-) Orthopnea and (-) PND  Rhythm:Regular Rate:Normal  Normal stress test 03/27/2024   Neuro/Psych  Headaches PSYCHIATRIC DISORDERS Anxiety Depression    H/o compression fracture of spine    GI/Hepatic PUD,GERD  Medicated,,(+)     substance abuse (not currently)  alcohol useDiverticulosis    Endo/Other    Renal/GU Renal disease (congenital absence of one kidney)   H/o prostate cancer    Musculoskeletal  (+) Arthritis ,  Osteoporosis    Abdominal   Peds  Hematology Lab Results      Component                Value               Date                      WBC                      7.9                 08/15/2024                HGB                      13.9                08/15/2024                HCT                      43.6                08/15/2024                MCV                      91.6                08/15/2024                PLT                      301                 08/15/2024              Anesthesia Other Findings   Reproductive/Obstetrics                              Anesthesia Physical Anesthesia Plan  ASA: 3  Anesthesia Plan: General   Post-op Pain Management:    Induction: Intravenous  PONV Risk Score and Plan: 1 and  Ondansetron   Airway Management Planned: LMA  Additional Equipment:   Intra-op Plan:   Post-operative Plan: Extubation in OR  Informed Consent: I have reviewed the patients History and Physical, chart, labs and discussed the procedure including the risks, benefits and alternatives for the proposed anesthesia with the patient or authorized representative who has indicated his/her understanding and acceptance.     Dental advisory given  Plan Discussed with: CRNA  Anesthesia Plan Comments:          Anesthesia Quick Evaluation

## 2024-10-29 NOTE — Brief Op Note (Signed)
 10/29/2024  2:22 PM  PATIENT:  Frank Terry Alert  58 y.o. male  PRE-OPERATIVE DIAGNOSIS:  Facial mass  POST-OPERATIVE DIAGNOSIS:  Facial mass  PROCEDURE:  Procedure(s): Facial mass excision (Left)  SURGEON:  Surgeons and Role:    DEWAINE Anice Riis, DO - Primary  PHYSICIAN ASSISTANT:   ASSISTANTS: none   ANESTHESIA:   LMA  EBL:  10 mL   BLOOD ADMINISTERED:none  DRAINS: none   LOCAL MEDICATIONS USED:  LIDOCAINE    SPECIMEN:  Source of Specimen:  left forehead  DISPOSITION OF SPECIMEN:  PATHOLOGY  COUNTS:  YES  TOURNIQUET:  * No tourniquets in log *  DICTATION: .Note written in EPIC  PLAN OF CARE: Discharge to home after PACU  PATIENT DISPOSITION:  PACU - hemodynamically stable.   Delay start of Pharmacological VTE agent (>24hrs) due to surgical blood loss or risk of bleeding: no

## 2024-10-29 NOTE — Interval H&P Note (Signed)
 History and Physical Interval Note:  10/29/2024 12:53 PM  Frank Terry Alert  has presented today for surgery, with the diagnosis of Facial mass.  The various methods of treatment have been discussed with the patient and family. After consideration of risks, benefits and other options for treatment, the patient has consented to  Procedure(s) with comments: EXCISION, MASS, HEAD (N/A) - Facial mass excision MAC anesthesia vs LMA as a surgical intervention.  The patient's history has been reviewed, patient examined, no change in status, stable for surgery.  I have reviewed the patient's chart and labs.  Questions were answered to the patient's satisfaction.    No change in health. Left brow 2cm lesion. No change. Aware of scar and swelling. Aware risk of recurrence. Discussed risk of numbness. Agrees to proceed.    Frank Terry

## 2024-10-29 NOTE — Anesthesia Postprocedure Evaluation (Signed)
 Anesthesia Post Note  Patient: Frank Terry Alert  Procedure(s) Performed: Facial mass excision (Left: Face)     Patient location during evaluation: PACU Anesthesia Type: General Level of consciousness: awake Pain management: pain level controlled Vital Signs Assessment: post-procedure vital signs reviewed and stable Respiratory status: spontaneous breathing, nonlabored ventilation and respiratory function stable Cardiovascular status: blood pressure returned to baseline and stable Postop Assessment: no apparent nausea or vomiting Anesthetic complications: no   No notable events documented.  Last Vitals:  Vitals:   10/29/24 1446 10/29/24 1458  BP: (!) 135/94   Pulse: 91 94  Resp: 13 15  Temp:    SpO2: 96% 93%    Last Pain:  Vitals:   10/29/24 1445  TempSrc:   PainSc: 4                  Delon Aisha Arch

## 2024-10-29 NOTE — Op Note (Signed)
 Otolaryngology Operative note  [name] Date/Time of Admission: 10/29/2024 10:57 AM  CSN: [id number];MRN:[id number]  DOB: May 15, 1966 Age: 57 y.o. Location: [facility]    Pre-Op Diagnosis: Left Facial mass  Post-Op Diagnosis: Left Facial mass  Procedure: Procedure(s): Facial mass excision 11443 Intermediate and layerd closure 12051  Surgeon: Penne Croak, DO  Anesthesia type:  General  Anesthesiologist: CRNA: Burnard Rosaline HERO, CRNA   Staff: Circulator: Ethan Render DASEN, RN; Eliberto Geroge HERO, RN Relief Scrub: Alver Greig HERO, RN Scrub Person: Alto Charmaine PARAS  Implants: * No implants in log *  Specimens: ID Type Source Tests Collected by Time Destination  1 : Left Forehead Mass Tissue PATH Other SURGICAL PATHOLOGY Croak Penne, DO 10/29/2024 1340     EBL:  10 mL  Drains: noen  Post-op disposition and condition: PACU, hemodynamically stable   Findings: 2.5 cm mass deep to frontalis and superficial to periosteum Complications: None apparent  Indications and consent:  Frank Terry is a 58 y.o. male with diagnoses above. The patient's options were discussed, including risks/benefits/alternatives for each option. Patient expressed understanding, and despite these risks, consented and decided to proceed with above procedures. Informed consent was signed before proceeding.  Procedure: After informed consent was obtained patient brought to the operating theater laid supine.  Anesthetic initiated via LMA.  Once an adequate plane was obtained the incision was marked out 2.5 cm in length.  This was aligned in a relaxed skin tension line.  1 cc of 1% lidocaine  with 1 100,000 epinephrine  was injected into the dermis.  A timeout was performed.  The patient was prepped and draped in sterile fashion.  An incision was made with a 15 blade.  Bipolar cautery was used for hemostasis.  This was carried down with an iris scissor down to the mass.  With careful  dissection this was fully dissected out.  This was deep to the frontalis muscle and superficial to the periosteum.  There was no large vessel bleeding or nerves encountered. The lesion was excised in full thickness and submitted for pathological evaluation. Copious irrigation followed with additional bipolar cautery for hemostasis.  Due to the tension and contour of the surrounding facial tissues, adjacent tissue trimming was performed to allow for proper alignment and tension-free closure. Hemostasis was obtained with cautery. 4.0 vicryl deep was used to approximate the frontalis. 5-0 monocryl used running subcuticular with dermabond overlying the skin. Layered closure was achieved using deep dermal sutures to re-approximate the underlying tissue, followed by a fine skin closure to optimize cosmetic result. Sterile dressing was applied. The patient tolerated the procedure well.

## 2024-10-30 ENCOUNTER — Encounter (HOSPITAL_BASED_OUTPATIENT_CLINIC_OR_DEPARTMENT_OTHER): Payer: Self-pay

## 2024-10-30 ENCOUNTER — Telehealth (INDEPENDENT_AMBULATORY_CARE_PROVIDER_SITE_OTHER): Payer: Self-pay

## 2024-10-30 NOTE — Telephone Encounter (Signed)
 Patient called and left voicemail concerned about surgical site appearance. He explains that he had a mass excision yesterday 10/29/2024 with Dr. Anice. He says that he is having swelling and bruising around his eye. He says that the bruising is black/blue. Patient explains that he has no fever and the site is not hot to the touch. I called and left voicemail per Dr. Anice instructing him to remain sitting upright, continue to ice the site, and for him to continue taking antibiotics. I also explained that the site may start to look worse today but that it should start to look better over the weekend. I left our number and instructed the patient to give us  a call back if he has any questions.

## 2024-10-31 LAB — SURGICAL PATHOLOGY

## 2024-11-03 ENCOUNTER — Ambulatory Visit: Admitting: Physical Therapy

## 2024-11-03 DIAGNOSIS — M47816 Spondylosis without myelopathy or radiculopathy, lumbar region: Secondary | ICD-10-CM | POA: Diagnosis not present

## 2024-11-03 DIAGNOSIS — M47812 Spondylosis without myelopathy or radiculopathy, cervical region: Secondary | ICD-10-CM | POA: Diagnosis not present

## 2024-11-03 DIAGNOSIS — M2560 Stiffness of unspecified joint, not elsewhere classified: Secondary | ICD-10-CM | POA: Diagnosis present

## 2024-11-03 DIAGNOSIS — M546 Pain in thoracic spine: Secondary | ICD-10-CM | POA: Diagnosis present

## 2024-11-03 DIAGNOSIS — M4804 Spinal stenosis, thoracic region: Secondary | ICD-10-CM | POA: Insufficient documentation

## 2024-11-03 DIAGNOSIS — M5416 Radiculopathy, lumbar region: Secondary | ICD-10-CM | POA: Insufficient documentation

## 2024-11-03 DIAGNOSIS — G8929 Other chronic pain: Secondary | ICD-10-CM | POA: Insufficient documentation

## 2024-11-03 DIAGNOSIS — M5414 Radiculopathy, thoracic region: Secondary | ICD-10-CM | POA: Insufficient documentation

## 2024-11-03 DIAGNOSIS — M542 Cervicalgia: Secondary | ICD-10-CM | POA: Diagnosis present

## 2024-11-03 DIAGNOSIS — G894 Chronic pain syndrome: Secondary | ICD-10-CM | POA: Insufficient documentation

## 2024-11-03 DIAGNOSIS — M5459 Other low back pain: Secondary | ICD-10-CM | POA: Insufficient documentation

## 2024-11-03 DIAGNOSIS — M47814 Spondylosis without myelopathy or radiculopathy, thoracic region: Secondary | ICD-10-CM | POA: Diagnosis not present

## 2024-11-03 DIAGNOSIS — F1911 Other psychoactive substance abuse, in remission: Secondary | ICD-10-CM | POA: Insufficient documentation

## 2024-11-03 DIAGNOSIS — M6281 Muscle weakness (generalized): Secondary | ICD-10-CM | POA: Diagnosis present

## 2024-11-03 DIAGNOSIS — M5412 Radiculopathy, cervical region: Secondary | ICD-10-CM | POA: Insufficient documentation

## 2024-11-03 DIAGNOSIS — M47894 Other spondylosis, thoracic region: Secondary | ICD-10-CM | POA: Insufficient documentation

## 2024-11-03 DIAGNOSIS — R5381 Other malaise: Secondary | ICD-10-CM | POA: Insufficient documentation

## 2024-11-03 NOTE — Progress Notes (Unsigned)
 PROVIDER NOTE: Interpretation of information contained herein should be left to medically-trained personnel. Specific patient instructions are provided elsewhere under Patient Instructions section of medical record. This document was created in part using AI and STT-dictation technology, any transcriptional errors that may result from this process are unintentional.  Patient: Frank Terry  Service: E/M   PCP: Theophilus Pagan, MD  DOB: 1966-08-10  DOS: 11/05/2024  Provider: Emmy MARLA Blanch, NP  MRN: [id number]  Delivery: Face-to-face  Specialty: Interventional Pain Management  Type: Established Patient  Setting: Ambulatory outpatient facility  Specialty designation: 09  Referring Prov.: Theophilus Pagan, MD  Location: Outpatient office facility       History of present illness (HPI) Frank Terry, a 57 y.o. year old male, is here today because of his Radicular pain of thoracic region [M54.14]. Frank Terry's primary complain today is No chief complaint on file.  Pertinent problems: Frank Terry has Asthma, chronic; Prostate cancer (HCC); Gastric ulcer; Knee pain, bilateral; Compression fracture of spine (HCC); Spondylosis of lumbar region without myelopathy or radiculopathy; Congenital absence of left kidney; Radicular pain of thoracic region; Foraminal stenosis of thoracic region; Thoracic facet joint syndrome; History of substance abuse (HCC); and Chronic pain syndrome on their pertinent problem list.  Pain Assessment: Severity of   is reported as a  /10. Location:    / . Onset:  . Quality:  . Timing:  . Modifying factor(s):  SABRA Vitals:  vitals were not taken for this visit.  BMI: Estimated body mass index is 32.95 kg/m as calculated from the following:   Height as of 10/29/24: 5' 9 (1.753 m).   Weight as of 10/29/24: 223 lb 1.7 oz (101.2 kg).  Last encounter: Visit date not found. Last procedure: Visit date not found.  Reason for encounter: post-procedure evaluation and assessment.    Discussed the use of AI scribe software for clinical note transcription with the patient, who gave verbal consent to proceed.  History of Present Illness         Procedure Type:  Inter-Laminar Thoracic Epidural Steroid Block/Injection  #1  Laterality: Left Level: T10-11  DOS: 10/08/2024 Performed by: Wallie Sherry, MD Imaging: Fluoroscopic guidance Anesthesia: Local anesthesia (1-2% Lidocaine ) Purpose: Diagnostic/Therapeutic Indications: Thoracic back pain, radicular pain, with degenerative disc disease severe enough to impact quality of life or function. 1. Radicular pain of thoracic region   2. Foraminal stenosis of thoracic region   3. Compression fracture of thoracic vertebra, unspecified thoracic vertebral level, sequela     NAS-11 Pain score:        Pre-procedure: 4 /10        Post-procedure: 4 /10   Post-Procedure Evaluation   Effectiveness:  Initial hour after procedure:   ***. Subsequent 4-6 hours post-procedure:   ***. Analgesia past initial 6 hours:   ***. Ongoing improvement:  Analgesic:  *** Function:    ***    ROM:    ***    Interpretation: ***   Pharmacotherapy Assessment   Monitoring: [place] PMP: PDMP not reviewed this encounter.       Pharmacotherapy: No side-effects or adverse reactions reported. Compliance: No problems identified. Effectiveness: Clinically acceptable.  No notes on file  UDS:  No results found for: SUMMARY  No results found for: CBDTHCR No results found for: D8THCCBX No results found for: D9THCCBX  ROS  Constitutional: Denies any fever or chills Gastrointestinal: No reported hemesis, hematochezia, vomiting, or acute GI distress Musculoskeletal: Denies any acute onset joint swelling, redness, loss  of ROM, or weakness Neurological: No reported episodes of acute onset apraxia, aphasia, dysarthria, agnosia, amnesia, paralysis, loss of coordination, or loss of consciousness  Medication Review  acetaminophen , albuterol ,  cephALEXin , cetirizine, colchicine , fluticasone -salmeterol, irbesartan , nortriptyline , omeprazole, rizatriptan , and sildenafil   History Review  Allergy: Frank Terry is allergic to penicillins. Drug: Frank Terry  reports that he does not currently use drugs. Alcohol:  reports that he does not currently use alcohol after a past usage of about 168.0 standard drinks of alcohol per week. Tobacco:  reports that he has quit smoking. His smoking use included cigarettes. He started smoking about 41 years ago. He uses smokeless tobacco. Social: Frank Terry  reports that he has quit smoking. His smoking use included cigarettes. He started smoking about 41 years ago. He uses smokeless tobacco. He reports that he does not currently use alcohol after a past usage of about 168.0 standard drinks of alcohol per week. He reports that he does not currently use drugs. Medical:  has a past medical history of Alcohol use disorder, Allergy, Anxiety, Arthritis, Asthma, Compression fracture of spine (HCC), Congenital absence of one kidney, Depression, Gastric ulcer, GERD (gastroesophageal reflux disease), Gout, HTN (hypertension), Hyponatremia, Osteoporosis, Prostate cancer (HCC), Ulcer, and Vasitis (05/2023). Surgical: Frank Terry  has a past surgical history that includes Tonsillectomy; Colonoscopy with esophagogastroduodenoscopy (egd); Prostate biopsy; Robot assisted laparoscopic radical prostatectomy (N/A, 06/18/2023); Pelvic lymph node dissection (Bilateral, 06/18/2023); Mass excision (Left, 09/19/2023); and Excision mass head (Left, 10/29/2024). Family: family history includes Alcohol abuse in his mother; Arthritis in his mother; Cancer in his father and mother; Heart attack in his father; Heart disease in his mother; Hypertension in his father, mother, and sister; Lung cancer in his mother; Multiple sclerosis in his sister; Osteoporosis in his mother.  Laboratory Chemistry Profile   Renal Lab Results  Component Value Date    BUN 20 08/15/2024   CREATININE 1.13 08/15/2024   BCR 17 03/03/2024   GFRAA >60 10/31/2013   GFRNONAA >60 08/15/2024    Hepatic Lab Results  Component Value Date   AST 15 08/15/2024   ALT 13 08/15/2024   ALBUMIN 4.2 08/15/2024   ALKPHOS 77 08/15/2024   LIPASE 35 03/20/2024    Electrolytes Lab Results  Component Value Date   NA 135 08/15/2024   K 3.8 08/15/2024   CL 104 08/15/2024   CALCIUM  9.5 08/15/2024   MG 2.2 03/05/2023   PHOS 3.3 03/04/2023    Bone Lab Results  Component Value Date   VD25OH 30.6 09/25/2023    Inflammation (CRP: Acute Phase) (ESR: Chronic Phase) Lab Results  Component Value Date   CRP 1 04/02/2024   ESRSEDRATE 6 04/02/2024   LATICACIDVEN 1.0 01/12/2024         Note: Above Lab results reviewed.  Recent Imaging Review  DG PAIN CLINIC C-ARM 1-60 MIN NO REPORT Fluoro was used, but no Radiologist interpretation will be provided.  Please refer to NOTES tab for provider progress note. Note: Reviewed        Physical Exam  Vitals: There were no vitals taken for this visit. BMI: Estimated body mass index is 32.95 kg/m as calculated from the following:   Height as of 10/29/24: 5' 9 (1.753 m).   Weight as of 10/29/24: 223 lb 1.7 oz (101.2 kg). Ideal: Ideal body weight: 70.7 kg (155 lb 13.8 oz) Adjusted ideal body weight: 82.9 kg (182 lb 12.2 oz) General appearance: Well nourished, well developed, and well hydrated. In no apparent acute distress Mental  status: Terry, oriented x 3 (person, place, & time)       Respiratory: No evidence of acute respiratory distress Eyes: PERLA   Assessment   Diagnosis Status  1. Radicular pain of thoracic region   2. Foraminal stenosis of thoracic region   3. Compression fracture of thoracic vertebra, unspecified thoracic vertebral level, sequela   4. Thoracic facet joint syndrome   5. History of substance abuse (HCC)   6. Chronic pain syndrome    Controlled Controlled Controlled   Updated  Problems: Problem  Radicular Pain of Thoracic Region  Foraminal Stenosis of Thoracic Region  Thoracic Facet Joint Syndrome  History of Substance Abuse (Hcc)  Chronic Pain Syndrome    Plan of Care  Problem-specific:  Assessment and Plan            Frank Terry has a current medication list which includes the following long-term medication(s): albuterol , cetirizine, fluticasone -salmeterol, irbesartan , nortriptyline , omeprazole, rizatriptan , and sildenafil .  Pharmacotherapy (Medications Ordered): No orders of the defined types were placed in this encounter.  Orders:  No orders of the defined types were placed in this encounter.    {There is no content from the last Plan section.}   No follow-ups on file.    Recent Visits Date Type Provider Dept  10/08/24 Procedure visit Marcelino Nurse, MD [facility]  09/23/24 Office Visit Marcelino Nurse, MD [facility]  Showing recent visits within past 90 days and meeting all other requirements Future Appointments Date Type Provider Dept  11/05/24 Appointment Aneira Cavitt K, NP [facility]  Showing future appointments within next 90 days and meeting all other requirements  I discussed the assessment and treatment plan with the patient. The patient was provided an opportunity to ask questions and all were answered. The patient agreed with the plan and demonstrated an understanding of the instructions.  Patient advised to call back or seek an in-person evaluation if the symptoms or condition worsens.  Duration of encounter: *** minutes.  Total time on encounter, as per AMA guidelines included both the face-to-face and non-face-to-face time personally spent by the physician and/or other qualified health care professional(s) on the day of the encounter (includes time in activities that require the physician or other qualified health care professional and does not include time in activities normally performed by  clinical staff). Physician's time may include the following activities when performed: Preparing to see the patient (e.g., pre-charting review of records, searching for previously ordered imaging, lab work, and nerve conduction tests) Review of prior analgesic pharmacotherapies. Reviewing PMP Interpreting ordered tests (e.g., lab work, imaging, nerve conduction tests) Performing post-procedure evaluations, including interpretation of diagnostic procedures Obtaining and/or reviewing separately obtained history Performing a medically appropriate examination and/or evaluation Counseling and educating the patient/family/caregiver Ordering medications, tests, or procedures Referring and communicating with other health care professionals (when not separately reported) Documenting clinical information in the electronic or other health record Independently interpreting results (not separately reported) and communicating results to the patient/ family/caregiver Care coordination (not separately reported)  Note by: Marcquis Ridlon K Joyclyn Plazola, NP (TTS and AI technology used. I apologize for any typographical errors that were not detected and corrected.) Date: 11/05/2024; Time: 2:56 PM

## 2024-11-05 ENCOUNTER — Ambulatory Visit: Attending: Nurse Practitioner | Admitting: Nurse Practitioner

## 2024-11-05 VITALS — BP 145/87 | HR 109 | Temp 97.2°F | Resp 20 | Ht 69.0 in | Wt 215.0 lb

## 2024-11-05 DIAGNOSIS — M4804 Spinal stenosis, thoracic region: Secondary | ICD-10-CM | POA: Insufficient documentation

## 2024-11-05 DIAGNOSIS — G894 Chronic pain syndrome: Secondary | ICD-10-CM | POA: Insufficient documentation

## 2024-11-05 DIAGNOSIS — S22000S Wedge compression fracture of unspecified thoracic vertebra, sequela: Secondary | ICD-10-CM | POA: Insufficient documentation

## 2024-11-05 DIAGNOSIS — M5414 Radiculopathy, thoracic region: Secondary | ICD-10-CM | POA: Insufficient documentation

## 2024-11-05 DIAGNOSIS — M47894 Other spondylosis, thoracic region: Secondary | ICD-10-CM | POA: Insufficient documentation

## 2024-11-05 DIAGNOSIS — F1911 Other psychoactive substance abuse, in remission: Secondary | ICD-10-CM | POA: Insufficient documentation

## 2024-11-05 MED ORDER — PREDNISONE 20 MG PO TABS: ORAL_TABLET | ORAL | 0 refills | Status: AC

## 2024-11-05 NOTE — Progress Notes (Signed)
 Safety precautions to be maintained throughout the outpatient stay will include: orient to surroundings, keep bed in low position, maintain call bell within reach at all times, provide assistance with transfer out of bed and ambulation.

## 2024-11-05 NOTE — Therapy (Signed)
 OUTPATIENT PHYSICAL THERAPY THORACOLUMBAR EVALUATION  Patient Name: Frank Terry MRN: [id number] DOB:1965-12-05, 58 y.o., male Today's Date: 11/05/2024  END OF SESSION:  PT End of Session - 11/05/24 1740     Visit Number 1    Number of Visits 12    Date for Recertification  12/15/24    PT Start Time 0905    PT Stop Time 0948    PT Time Calculation (min) 43 min    Activity Tolerance Patient tolerated treatment well;Patient limited by pain    Behavior During Therapy [facility] for tasks assessed/performed          Past Medical History:  Diagnosis Date   Alcohol use disorder    pt currently lives at the [facility]   Allergy    Anxiety    Arthritis    Asthma    well controlled   Compression fracture of spine (HCC)    Congenital absence of one kidney    pt has right kidney   Depression    Gastric ulcer    GERD (gastroesophageal reflux disease)    no meds   Gout    HTN (hypertension)    Hyponatremia    Osteoporosis    Prostate cancer (HCC)    Ulcer    Vasitis 05/2023   Past Surgical History:  Procedure Laterality Date   COLONOSCOPY WITH ESOPHAGOGASTRODUODENOSCOPY (EGD)     EXCISION MASS HEAD Left 10/29/2024   Procedure: Facial mass excision;  Surgeon: Frank Riis, DO;  Location: [facility];  Service: ENT;  Laterality: Left;   MASS EXCISION Left 09/19/2023   Procedure: EXCISION MASS, deep soft tissue mass back;  Surgeon: Frank Shope, MD;  Location: [facility];  Service: General;  Laterality: Left;   PELVIC LYMPH NODE DISSECTION Bilateral 06/18/2023   Procedure: PELVIC LYMPH NODE DISSECTION;  Surgeon: Terry Knee, MD;  Location: [facility];  Service: Urology;  Laterality: Bilateral;   PROSTATE BIOPSY     ROBOT ASSISTED LAPAROSCOPIC RADICAL PROSTATECTOMY N/A 06/18/2023   Procedure: XI ROBOTIC ASSISTED LAPAROSCOPIC RADICAL PROSTATECTOMY;  Surgeon: Terry Knee, MD;  Location: [facility];  Service: Urology;  Laterality: N/A;   TONSILLECTOMY     age  66   Patient Active Problem List   Diagnosis Date Noted   Radicular pain of thoracic region 11/03/2024   Foraminal stenosis of thoracic region 11/03/2024   Thoracic facet joint syndrome 11/03/2024   History of substance abuse (HCC) 11/03/2024   Chronic pain syndrome 11/03/2024   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   New daily persistent headache 09/25/2023   Soft tissue mass 09/11/2023   Nodule of skin of back 08/16/2023   Nodule of skin of head 08/16/2023   Blurry vision 08/16/2023   Allergic rhinitis 08/10/2023   Osteoporosis 08/10/2023   Skin lesion 08/10/2023   Fatigue 04/06/2023   Terry pain, bilateral 03/19/2023   Compression fracture of spine (HCC) 03/19/2023   Anxious mood 03/19/2023   B12 deficiency 03/19/2023   Hypertension 03/03/2023   Gastric ulcer 03/03/2023   Hyponatremia 03/02/2023   Asthma, chronic 03/02/2023   Gout 03/02/2023   Prostate cancer (HCC) 03/02/2023   Spondylosis of lumbar region without myelopathy or radiculopathy 03/05/2020   Diverticulosis of intestine with bleeding 01/28/2016   Congenital absence of left kidney 01/28/2016   PCP: Frank Pagan, MD  REFERRING PROVIDER: Hilma Hastings, PA-C  REFERRING DIAG:  Diagnosis  M47.816 (ICD-10-CM) - Lumbar spondylosis  M54.16 (ICD-10-CM) - Lumbar radiculopathy  M54.12 (ICD-10-CM) - Cervical radiculopathy  F52.187 (ICD-10-CM) - Cervical spondylosis  M47.814 (ICD-10-CM) - Thoracic spondylosis   Rationale for Evaluation and Treatment: Rehabilitation  THERAPY DIAG:  Other low back pain  Chronic left-sided thoracic back pain  Neck pain  Joint stiffness of spine  Muscle weakness (generalized)  Physical deconditioning  ONSET DATE: chronic  SUBJECTIVE:                                                                                                                                                                                           SUBJECTIVE STATEMENT: Pt. Referred to PT with chronic h/o  neck/thoracic/lumbar spine pain.  Pt. Currently on disability since 2020 secondary to back pain.  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  Pt. Has been living at [facility] in [place] for past 1.5 years of sober living.  Pt. Has 4 stairs to enter/exit house.  Pt. Received an injection in back from Dr. Marcelino but pain returned in <1 week.  Pt. Currently ambulates with SPC and limited with walking distance.  Pt. Was able to walk 5 blocks in [place] in past but currently limited by pain/ deconditioning.  Pt. Not driving and hoping to join [employer] with [employer] membership.  Pt. Has received PT in past with Frank Terry at [facility] s/p prostate surgery.    PERTINENT HISTORY:  History of present illness (HPI) Frank Terry, a 58 y.o. year old male, is here today because of his Radicular pain of thoracic region [M54.14]. Frank Terry's primary complain today is Back Pain   Pertinent problems: Frank Terry has Asthma, chronic; Prostate cancer (HCC); Gastric ulcer; Terry pain, bilateral; Compression fracture of spine (HCC); Spondylosis of lumbar region without myelopathy or radiculopathy; Congenital absence of left kidney; Radicular pain of thoracic region; Foraminal stenosis of thoracic region; Thoracic facet joint syndrome; History of substance abuse (HCC); and Chronic pain syndrome on their pertinent problem list.   Pain Assessment: Severity of Chronic pain is reported as a 4 /10. Location: Back Left, Mid, Lower/Radiates into abdomen. Onset: More than a month ago. Quality: Sharp, Aching, Constant. Timing: Constant. Modifying factor(s): Denies. Vitals:  height is 5' 9 (1.753 m) and weight is 215 lb (97.5 kg). His temporal temperature is 97.2 F (36.2 C) (abnormal). His blood pressure is 145/87 (abnormal) and his pulse is 109 (abnormal). His respiration is 20 and oxygen saturation is 100%.  BMI: Estimated body mass index is 31.75 kg/m as calculated from the following:   Height as of this  encounter: 5' 9 (1.753 m).   Weight as of this encounter: 215 lb (97.5 kg).   Last encounter:  Visit date not found. Last procedure: Visit date not found.   Reason for encounter: post-procedure evaluation and assessment.    Discussed the use of AI scribe software for clinical note transcription with the patient, who gave verbal consent to proceed.   History of Present Illness   He is a 58 year old male who presents with persistent thoracic pain following a thoracic epidural steroid injection.   He underwent a thoracic epidural steroid injection on October 08, 2024. Initially, he experienced complete pain relief immediately after the procedure, but the pain returned to a level of six out of ten within four to six hours. He reported approximately 50% pain relief for the first week post-procedure, but no relief thereafter.   Prior to the procedure, his pain level was consistently around six out of ten, worsening throughout the day from a two or three in the morning to a six by noon. The pain is described as a shooting pain across the abdomen, radiating from the back around to the front. Previously, lying down provided relief, but now it does not, and he has experienced several sleepless nights due to discomfort.   He takes Advil  and Tylenol , typically once or twice a day, to manage the pain. He has also used ice and heat therapy post-procedure, applying ice for six to eight hours on the day of the procedure and heat the following day.   He denied engaging in any work or activities that could have caused injury. Bending or stretching does not significantly increase his pain. However, activities such as standing or sitting upright for extended periods, or bending slightly while performing tasks, exacerbate his symptoms.   He has no history of diabetes, and his glucose levels have been normal in the past. He recalls having his sugar levels checked by his primary care doctor, but cannot remember the  exact timing of the last test.     Procedure Type:  Inter-Laminar Thoracic Epidural Steroid Block/Injection  #1  Laterality: Left Level: T10-11  DOS: 10/08/2024 Performed by: Wallie Sherry, MD Imaging: Fluoroscopic guidance Anesthesia: Local anesthesia (1-2% Lidocaine ) Purpose: Diagnostic/Therapeutic Indications: Thoracic back pain, radicular pain, with degenerative disc disease severe enough to impact quality of life or function. 1. Radicular pain of thoracic region   2. Foraminal stenosis of thoracic region   3. Compression fracture of thoracic vertebra, unspecified thoracic vertebral level, sequela     NAS-11 Pain score:        Pre-procedure: 4 /10        Post-procedure: 4 /10    Post-Procedure Evaluation    Effectiveness:  Initial hour after procedure: 100 % . Subsequent 4-6 hours post-procedure: 100 % . Analgesia past initial 6 hours: 50 % . Ongoing improvement:  Analgesic:  50% (approximately 7 days, and then pain return to baseline) Function: Somewhat improved ROM: Somewhat improved Interpretation: Mr. Chelsey underwent a diagnostic/therapeutic Inter-Laminar Thoracic Epidural Steroid Block/Injection on October 08, 2024.  He reports initially 100% pain relief and functional improvement during local anesthetic phase, followed by sustained ongoing 50% pain relief and functional improvement approximately 7 days and then pain returned to baseline.   PAIN:  Are you having pain? Yes: NPRS scale: 5/10 Pain location: cervical/thoracic/lumbar spine Pain description: sharp in L thoracic spine/ribs/ radiating pain Aggravating factors: prolonged standing/ increase movement Relieving factors: short-term relief with injection  PRECAUTIONS: None  RED FLAGS: None   WEIGHT BEARING RESTRICTIONS: No  FALLS:  Has patient fallen in last 6 months? No  LIVING ENVIRONMENT:  Lives with: lives in an adult home Lives in: House/apartment Stairs: Yes: External: 4 steps; none Has following  equipment at home: Single point cane  OCCUPATION: On disability  PLOF: Independent  PATIENT GOALS: Decrease neck/back pain and increase overall conditioning to improve walking endurance/ functional mobility.    NEXT MD VISIT: 11/05/24  OBJECTIVE:  Note: Objective measures were completed at Evaluation unless otherwise noted.  DIAGNOSTIC FINDINGS:  EXAM: MRI CERVICAL SPINE WITHOUT CONTRAST 08/12/2024 07:42:00 AM   TECHNIQUE: Multiplanar multisequence MRI of the cervical spine was performed.   COMPARISON: CT of the cervical spine dated 07/30/2024.   CLINICAL HISTORY: Abnormal MRI. Weakness in limbs with pain in neck. HA's. Numb/ting in all extremities for 6 months.   FINDINGS:   BONES AND ALIGNMENT: Normal alignment. Normal vertebral body heights. Bone marrow signal is unremarkable.   SPINAL CORD: Normal spinal cord size. No abnormal spinal cord signal. No apparent impingement of the spinal cord.   SOFT TISSUES: No paraspinal mass.   C2-C3: No significant disc herniation. No spinal canal stenosis or neural foraminal narrowing.   C3-C4: Minimal disc bulging and mild bilateral uncovertebral joint hypertrophy with mild central spinal canal stenosis. The neural foramina are patent.   C4-C5: No significant disc herniation. No spinal canal stenosis or neural foraminal narrowing.   C5-C6: Broad-based disc bulge and endplate ridging with mild central spinal canal stenosis and mild right neural foraminal stenosis.   C6-C7: Diffuse disc bulging and left-sided uncovertebral joint hypertrophy, with mild left spinal canal and left neural foraminal stenosis.   C7-T1: No significant disc herniation. No spinal canal stenosis or neural foraminal narrowing.   IMPRESSION: 1. Mild central spinal canal stenosis at C3-4, C5-6, and left C6-7. 2. Mild right neural foraminal stenosis at C5-6 and mild left neural foraminal stenosis at C6-7. 3. No apparent impingement of the  spinal cord or nerve roots.   Electronically signed by: Evalene Coho MD 08/12/2024 08:16 AM  EXAM: MRI THORACIC SPINE WITHOUT CONTRAST   TECHNIQUE: Multiplanar, multisequence MR imaging of the thoracic spine was performed. No intravenous contrast was administered.   COMPARISON:  CT cervical spine 07/30/2024. CTA chest 03/20/2024. CT Abdomen and Pelvis 07/14/2023.   FINDINGS: Limited cervical spine imaging: Largely negative for age, concordant with cervical spine CT earlier this month.   Thoracic spine segmentation:  Normal on previous CTs.   Alignment: Chronic exaggerated lower thoracic kyphosis in conjunction with chronic lower thoracic compression fractures, stable from CT last year. No significant thoracic scoliosis or spondylolisthesis.   Vertebrae: Chronic superior endplate compression fractures of T10, T11 and T12 are stable since last year. No associated marrow edema. Normal background bone marrow signal. Small chronic sclerotic focus superimposed in the right T10 vertebral body also stable by CT since last year and compatible with benign etiology, probably bone island. Stable, maintained thoracic vertebral height elsewhere. No marrow edema or evidence of acute osseous abnormality.   Cord: Normal. Capacious thoracic spinal canal at most levels, see details below. Conus medullaris mostly visible at T12-L1 and appears to be normal.   Paraspinal and other soft tissues: Stable, negative.   Disc levels:   No age advanced thoracic spine degeneration   T1-T2 through T7-T8.   T8-T9: Circumferential disc bulge with mild to moderate facet and ligament flavum hypertrophy. No spinal stenosis. Mild left and moderate to severe right T8 neural foraminal stenosis.   T9-T10: Negative disc. Moderate to severe facet hypertrophy. Mild left and moderate to severe right T9 foraminal stenosis.  T10-T11: Minor disc bulging. Moderate facet and ligament flavum hypertrophy. Mild  T10 foraminal stenosis.   T11-T12: Disc osteophyte complex in part related to chronic retropulsion of the posterosuperior T12 endplate, stable from last year. Moderate posterior element hypertrophy. Borderline to mild spinal stenosis. No cord mass effect. Mild bilateral T11 foraminal stenosis.   IMPRESSION: 1. No acute or suspicious osseous abnormality in the thoracic spine. Chronic T10, T11, and T12 compression fractures are stable from last year. 2. Mild for age thoracic spine degeneration above T8-T9. Mild chronic T12 retropulsion contributes to borderline or mild spinal stenosis at T11-T12. Lower thoracic facet arthropathy results in moderate to severe neural foraminal stenosis at the right T8 and T9 nerve levels.   Electronically Signed   By: VEAR Hurst M.D.   On: 08/08/2024 10:57  MRI LUMBAR SPINE 07/01/2024 02:37:10 PM   TECHNIQUE: Multiplanar multisequence MRI of the lumbar spine was performed without the administration of intravenous contrast.   COMPARISON: MRI of the lumbar spine dated 04/04/2023.   CLINICAL HISTORY: Low back pain, cauda equina syndrome suspected. Pt reports lower back pain that radiates to his abdomen and is now causing him to leak urine. Pt has prostate hx and chronic back pain but the incontinence is new.   FINDINGS:   BONES AND ALIGNMENT: Moderate chronic compression deformity of T12, unchanged from prior study. The vertebral body has lost about 50% of its height anteriorly and there is a chronic schmorl's node within the superior endplate. There is chronic downward bowing of the superior endplates of L1 and L2, as before.   SPINAL CORD: The conus medullaris terminates at L1.   SOFT TISSUES: No paraspinal mass.   L1-L2: No significant disc herniation. No spinal canal stenosis or neural foraminal narrowing.   L2-L3: Mild diffuse disc bulging with mild bilateral lateral recess stenosis.   L3-L4: Slight disc bulging without  significant spinal canal or neural foraminal stenosis.   L4-L5: No significant disc herniation. No spinal canal stenosis or neural foraminal narrowing.   L5-S1: Slight disc bulging without significant spinal canal or neural foraminal stenosis.   OTHER FINDINGS: The left kidney is not visualized.   IMPRESSION: 1. No evidence of cauda equina syndrome. 2. Moderate chronic compression deformity of T12, unchanged. 3. Chronic downward bowing of the superior endplates of L1 and L2, as before. 4. Mild diffuse disc bulging at L2-3 with mild bilateral lateral recess stenosis. 5. Slight disc bulging at L3-4 and L5-S1 without significant spinal canal or neural foraminal stenosis.  PATIENT SURVEYS:  MODI: 68% self-perceived severe disability  COGNITION: Overall cognitive status: Within functional limits for tasks assessed     SENSATION: Light touch: Impaired   MUSCLE LENGTH: Hamstrings: Right 48 deg; Left 33 deg Thomas test: NT  POSTURE: rounded shoulders and forward head  PALPATION: Moderate tenderness in B lumbar paraspinals/ SI region.  Pt. Reports B LE radicular symptoms with prolonged standing.    LUMBAR ROM:  Flexion: limited 25%, extension: limited 50%, L/R rotn. Limited 25% and L/R lateral flexion WFL (L sided rib pain).  5/10 L thoracic/ rib pain.  Mid-thoracic/ lumbar hypomobility with assessment of central/unilateral mobs.    CERVICAL ROM:      Flexion: 41 deg./ extension 35 deg./ L rotn. 44 deg./ R rotn. 48 deg./ R lat. Flexion 40 deg./ L lateral flexion.   B shoulder AROM WFL   LOWER EXTREMITY MMT:    MMT Right eval Left eval  Hip flexion 4+ 4+  Hip extension  Hip abduction 4+ 4+  Hip adduction    Hip internal rotation 5 5  Hip external rotation 5 5  Terry flexion 5 5  Terry extension 5 5  Ankle dorsiflexion    Ankle plantarflexion    Ankle inversion    Ankle eversion     (Blank rows = not tested)  LUMBAR SPECIAL TESTS:  (+) SLR on L.  FUNCTIONAL  TESTS:  TBD  GAIT: Distance walked: in clinic Assistive device utilized: Single point cane Level of assistance: Modified independence Comments: antalgic gait with increase c/o back pain with distance walked.    TREATMENT DATE: 11/05/2024                                                                                                                              See evaluation/ will issue HEP and complete functional test next tx. session  PATIENT EDUCATION:  Education details: Spine hypomobility/ functional goals/ Artist Person educated: Patient Education method: Explanation Education comprehension: verbalized understanding  HOME EXERCISE PROGRAM: Will issue next tx. session  ASSESSMENT:  CLINICAL IMPRESSION: Patient is a pleasant 58 y.o. male who was seen today for physical therapy evaluation and treatment for cervical/thoracic/lumbar pain and generalized deconditioning.  Pt. Presents with limited spinal mobility and chronic back pain with B LE radicular symptoms.  Pt. Is currently not participating with exercise program and will benefit from skilled PT services to increase spinal mobility/ generalized strengthening and conditioning to progress to [employer] ex. Program/ walking program.   OBJECTIVE IMPAIRMENTS: Abnormal gait, decreased activity tolerance, decreased balance, decreased coordination, decreased endurance, decreased mobility, difficulty walking, decreased ROM, decreased strength, hypomobility, impaired flexibility, improper body mechanics, postural dysfunction, and pain.   ACTIVITY LIMITATIONS: carrying, lifting, bending, standing, squatting, and locomotion level  PARTICIPATION LIMITATIONS: cleaning and community activity  PERSONAL FACTORS: Fitness and Past/current experiences are also affecting patient's functional outcome.   REHAB POTENTIAL: Good  CLINICAL DECISION MAKING: Evolving/moderate complexity  EVALUATION COMPLEXITY: Moderate   GOALS: Goals  reviewed with patient? Yes  SHORT TERM GOALS: Target date: 11/24/24  Pt. Will be independent with HEP to increase B hip strength 1/2 muscle grade to improve walking/ stair climbing.   Baseline:  see above Goal status: INITIAL  2.  Pt. Able to complete 30 minutes of exercise with no increase c/o back pain or radicular symptoms to improve pain-free mobility.  Baseline:  5/10 back pain at rest and increase pain/ radicular symptoms with activity Goal status: INITIAL  LONG TERM GOALS: Target date: 12/15/24  Pt. Will decrease MODI to <50% to improve self-perceived pain-free mobility. Baseline: 68% self-perceived severe disability Goal status: INITIAL  2.  Pt. Will complete 5xSTS and decrease time by 3 seconds to improve pain-free mobility.  Baseline: TBD Goal status: INITIAL  3.  Pt. Able to walk 5 blocks from house to regular meetings with no increase c/o back pain/ improved endurance.   Baseline: limited walking distance due to moderate  back pain.   Goal status: INITIAL  4.  Pt. Will join/ participate with [facility] program at [facility] with no increase c/o back pain.   Baseline: currently not exercising Goal status: INITIAL  PLAN:  PT FREQUENCY: 2x/week  PT DURATION: 6 weeks  PLANNED INTERVENTIONS: 97110-Therapeutic exercises, 97530- Therapeutic activity, [id number]- Neuromuscular re-education, 97535- Self Care, [id number]- Manual therapy, [phone]- Gait training, [phone]- Electrical stimulation (unattended), and Patient/Family education.  PLAN FOR NEXT SESSION: Complete functional test (5xSTS).  Issue HEP for generalized mobility/ conditioning.    Ozell JAYSON Sero, PT, DPT # [phone] 11/05/2024, 5:43 PM

## 2024-11-06 ENCOUNTER — Ambulatory Visit (INDEPENDENT_AMBULATORY_CARE_PROVIDER_SITE_OTHER)

## 2024-11-06 ENCOUNTER — Ambulatory Visit: Admitting: Physical Therapy

## 2024-11-06 DIAGNOSIS — M6281 Muscle weakness (generalized): Secondary | ICD-10-CM

## 2024-11-06 DIAGNOSIS — M5459 Other low back pain: Secondary | ICD-10-CM

## 2024-11-06 DIAGNOSIS — M2560 Stiffness of unspecified joint, not elsewhere classified: Secondary | ICD-10-CM

## 2024-11-06 DIAGNOSIS — R5381 Other malaise: Secondary | ICD-10-CM

## 2024-11-06 DIAGNOSIS — G8929 Other chronic pain: Secondary | ICD-10-CM

## 2024-11-06 DIAGNOSIS — M542 Cervicalgia: Secondary | ICD-10-CM

## 2024-11-06 NOTE — Therapy (Unsigned)
 OUTPATIENT PHYSICAL THERAPY THORACOLUMBAR TREATMENT  Patient Name: Frank Terry MRN: [id number] DOB:[birth year], 58 y.o., male Today's Date: 11/06/2024  END OF SESSION:  PT End of Session - 11/06/24 0731     Visit Number 2    Number of Visits 12    Date for Recertification  12/15/24    PT Start Time 0731    PT Stop Time 0812    PT Time Calculation (min) 41 min    Activity Tolerance Patient tolerated treatment well;Patient limited by pain    Behavior During Therapy [facility] for tasks assessed/performed           Past Medical History:  Diagnosis Date   Alcohol use disorder    pt currently lives at the [facility]   Allergy    Anxiety    Arthritis    Asthma    well controlled   Compression fracture of spine (HCC)    Congenital absence of one kidney    pt has right kidney   Depression    Gastric ulcer    GERD (gastroesophageal reflux disease)    no meds   Gout    HTN (hypertension)    Hyponatremia    Osteoporosis    Prostate cancer (HCC)    Ulcer    Vasitis 05/2023   Past Surgical History:  Procedure Laterality Date   COLONOSCOPY WITH ESOPHAGOGASTRODUODENOSCOPY (EGD)     EXCISION MASS HEAD Left 10/29/2024   Procedure: Facial mass excision;  Surgeon: Anice Riis, DO;  Location: [facility];  Service: ENT;  Laterality: Left;   MASS EXCISION Left 09/19/2023   Procedure: EXCISION MASS, deep soft tissue mass back;  Surgeon: Lane Shope, MD;  Location: [facility];  Service: General;  Laterality: Left;   PELVIC LYMPH NODE DISSECTION Bilateral 06/18/2023   Procedure: PELVIC LYMPH NODE DISSECTION;  Surgeon: Riis Knee, MD;  Location: [facility];  Service: Urology;  Laterality: Bilateral;   PROSTATE BIOPSY     ROBOT ASSISTED LAPAROSCOPIC RADICAL PROSTATECTOMY N/A 06/18/2023   Procedure: XI ROBOTIC ASSISTED LAPAROSCOPIC RADICAL PROSTATECTOMY;  Surgeon: Riis Knee, MD;  Location: [facility];  Service: Urology;  Laterality: N/A;   TONSILLECTOMY     age  21   Patient Active Problem List   Diagnosis Date Noted   Radicular pain of thoracic region 11/03/2024   Foraminal stenosis of thoracic region 11/03/2024   Thoracic facet joint syndrome 11/03/2024   History of substance abuse (HCC) 11/03/2024   Chronic pain syndrome 11/03/2024   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   New daily persistent headache 09/25/2023   Soft tissue mass 09/11/2023   Nodule of skin of back 08/16/2023   Nodule of skin of head 08/16/2023   Blurry vision 08/16/2023   Allergic rhinitis 08/10/2023   Osteoporosis 08/10/2023   Skin lesion 08/10/2023   Fatigue 04/06/2023   Knee pain, bilateral 03/19/2023   Compression fracture of spine (HCC) 03/19/2023   Anxious mood 03/19/2023   B12 deficiency 03/19/2023   Hypertension 03/03/2023   Gastric ulcer 03/03/2023   Hyponatremia 03/02/2023   Asthma, chronic 03/02/2023   Gout 03/02/2023   Prostate cancer (HCC) 03/02/2023   Spondylosis of lumbar region without myelopathy or radiculopathy 03/05/2020   Diverticulosis of intestine with bleeding 01/28/2016   Congenital absence of left kidney 01/28/2016   PCP: Theophilus Pagan, MD  REFERRING PROVIDER: Hilma Hastings, PA-C  REFERRING DIAG:  Diagnosis  M47.816 (ICD-10-CM) - Lumbar spondylosis  M54.16 (ICD-10-CM) - Lumbar radiculopathy  M54.12 (ICD-10-CM) - Cervical radiculopathy  F52.187 (ICD-10-CM) - Cervical spondylosis  M47.814 (ICD-10-CM) - Thoracic spondylosis   Rationale for Evaluation and Treatment: Rehabilitation  THERAPY DIAG:  Other low back pain  Chronic left-sided thoracic back pain  Neck pain  Joint stiffness of spine  Muscle weakness (generalized)  Physical deconditioning  ONSET DATE: chronic  SUBJECTIVE:                                                                                                                                                                                           SUBJECTIVE STATEMENT: Pt. Referred to PT with chronic h/o  neck/thoracic/lumbar spine pain.  Pt. Currently on disability since 2020 secondary to back pain.  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  Pt. Has been living at [facility] in [place] for past 1.5 years of sober living.  Pt. Has 4 stairs to enter/exit house.  Pt. Received an injection in back from Dr. Marcelino but pain returned in <1 week.  Pt. Currently ambulates with SPC and limited with walking distance.  Pt. Was able to walk 5 blocks in [place] in past but currently limited by pain/ deconditioning.  Pt. Not driving and hoping to join [employer] with [employer] membership.  Pt. Has received PT in past with Lupe Plump at [facility] s/p prostate surgery.    PERTINENT HISTORY:  History of present illness (HPI) [name], a 58 y.o. year old male, is here today because of his Radicular pain of thoracic region [M54.14]. [name]'s primary complain today is Back Pain   Pertinent problems: [name] has Asthma, chronic; Prostate cancer (HCC); Gastric ulcer; Knee pain, bilateral; Compression fracture of spine (HCC); Spondylosis of lumbar region without myelopathy or radiculopathy; Congenital absence of left kidney; Radicular pain of thoracic region; Foraminal stenosis of thoracic region; Thoracic facet joint syndrome; History of substance abuse (HCC); and Chronic pain syndrome on their pertinent problem list.   Pain Assessment: Severity of Chronic pain is reported as a 4 /10. Location: Back Left, Mid, Lower/Radiates into abdomen. Onset: More than a month ago. Quality: Sharp, Aching, Constant. Timing: Constant. Modifying factor(s): Denies. Vitals:  height is 5' 9 (1.753 m) and weight is 215 lb (97.5 kg). His temporal temperature is 97.2 F (36.2 C) (abnormal). His blood pressure is 145/87 (abnormal) and his pulse is 109 (abnormal). His respiration is 20 and oxygen saturation is 100%.  BMI: Estimated body mass index is 31.75 kg/m as calculated from the following:   Height as of this  encounter: 5' 9 (1.753 m).   Weight as of this encounter: 215 lb (97.5 kg).   Last encounter:  Visit date not found. Last procedure: Visit date not found.   Reason for encounter: post-procedure evaluation and assessment.    Discussed the use of AI scribe software for clinical note transcription with the patient, who gave verbal consent to proceed.   History of Present Illness   He is a 58 year old male who presents with persistent thoracic pain following a thoracic epidural steroid injection.   He underwent a thoracic epidural steroid injection on October 08, 2024. Initially, he experienced complete pain relief immediately after the procedure, but the pain returned to a level of six out of ten within four to six hours. He reported approximately 50% pain relief for the first week post-procedure, but no relief thereafter.   Prior to the procedure, his pain level was consistently around six out of ten, worsening throughout the day from a two or three in the morning to a six by noon. The pain is described as a shooting pain across the abdomen, radiating from the back around to the front. Previously, lying down provided relief, but now it does not, and he has experienced several sleepless nights due to discomfort.   He takes Advil  and Tylenol , typically once or twice a day, to manage the pain. He has also used ice and heat therapy post-procedure, applying ice for six to eight hours on the day of the procedure and heat the following day.   He denied engaging in any work or activities that could have caused injury. Bending or stretching does not significantly increase his pain. However, activities such as standing or sitting upright for extended periods, or bending slightly while performing tasks, exacerbate his symptoms.   He has no history of diabetes, and his glucose levels have been normal in the past. He recalls having his sugar levels checked by his primary care doctor, but cannot remember the  exact timing of the last test.     Procedure Type:  Inter-Laminar Thoracic Epidural Steroid Block/Injection  #1  Laterality: Left Level: T10-11  DOS: 10/08/2024 Performed by: Wallie Sherry, MD Imaging: Fluoroscopic guidance Anesthesia: Local anesthesia (1-2% Lidocaine ) Purpose: Diagnostic/Therapeutic Indications: Thoracic back pain, radicular pain, with degenerative disc disease severe enough to impact quality of life or function. 1. Radicular pain of thoracic region   2. Foraminal stenosis of thoracic region   3. Compression fracture of thoracic vertebra, unspecified thoracic vertebral level, sequela     NAS-11 Pain score:        Pre-procedure: 4 /10        Post-procedure: 4 /10    Post-Procedure Evaluation    Effectiveness:  Initial hour after procedure: 100 % . Subsequent 4-6 hours post-procedure: 100 % . Analgesia past initial 6 hours: 50 % . Ongoing improvement:  Analgesic:  50% (approximately 7 days, and then pain return to baseline) Function: Somewhat improved ROM: Somewhat improved Interpretation: Mr. Kyng underwent a diagnostic/therapeutic Inter-Laminar Thoracic Epidural Steroid Block/Injection on October 08, 2024.  He reports initially 100% pain relief and functional improvement during local anesthetic phase, followed by sustained ongoing 50% pain relief and functional improvement approximately 7 days and then pain returned to baseline.   PAIN:  Are you having pain? Yes: NPRS scale: 5/10 Pain location: cervical/thoracic/lumbar spine Pain description: sharp in L thoracic spine/ribs/ radiating pain Aggravating factors: prolonged standing/ increase movement Relieving factors: short-term relief with injection  PRECAUTIONS: None  RED FLAGS: None   WEIGHT BEARING RESTRICTIONS: No  FALLS:  Has patient fallen in last 6 months? No  LIVING ENVIRONMENT:  Lives with: lives in an adult home Lives in: House/apartment Stairs: Yes: External: 4 steps; none Has following  equipment at home: Single point cane  OCCUPATION: On disability  PLOF: Independent  PATIENT GOALS: Decrease neck/back pain and increase overall conditioning to improve walking endurance/ functional mobility.    NEXT MD VISIT: 11/05/24  OBJECTIVE:  Note: Objective measures were completed at Evaluation unless otherwise noted.  DIAGNOSTIC FINDINGS:  EXAM: MRI CERVICAL SPINE WITHOUT CONTRAST 08/12/2024 07:42:00 AM   TECHNIQUE: Multiplanar multisequence MRI of the cervical spine was performed.   COMPARISON: CT of the cervical spine dated 07/30/2024.   CLINICAL HISTORY: Abnormal MRI. Weakness in limbs with pain in neck. HA's. Numb/ting in all extremities for 6 months.   FINDINGS:   BONES AND ALIGNMENT: Normal alignment. Normal vertebral body heights. Bone marrow signal is unremarkable.   SPINAL CORD: Normal spinal cord size. No abnormal spinal cord signal. No apparent impingement of the spinal cord.   SOFT TISSUES: No paraspinal mass.   C2-C3: No significant disc herniation. No spinal canal stenosis or neural foraminal narrowing.   C3-C4: Minimal disc bulging and mild bilateral uncovertebral joint hypertrophy with mild central spinal canal stenosis. The neural foramina are patent.   C4-C5: No significant disc herniation. No spinal canal stenosis or neural foraminal narrowing.   C5-C6: Broad-based disc bulge and endplate ridging with mild central spinal canal stenosis and mild right neural foraminal stenosis.   C6-C7: Diffuse disc bulging and left-sided uncovertebral joint hypertrophy, with mild left spinal canal and left neural foraminal stenosis.   C7-T1: No significant disc herniation. No spinal canal stenosis or neural foraminal narrowing.   IMPRESSION: 1. Mild central spinal canal stenosis at C3-4, C5-6, and left C6-7. 2. Mild right neural foraminal stenosis at C5-6 and mild left neural foraminal stenosis at C6-7. 3. No apparent impingement of the  spinal cord or nerve roots.   Electronically signed by: Evalene Coho MD 08/12/2024 08:16 AM  EXAM: MRI THORACIC SPINE WITHOUT CONTRAST   TECHNIQUE: Multiplanar, multisequence MR imaging of the thoracic spine was performed. No intravenous contrast was administered.   COMPARISON:  CT cervical spine 07/30/2024. CTA chest 03/20/2024. CT Abdomen and Pelvis 07/14/2023.   FINDINGS: Limited cervical spine imaging: Largely negative for age, concordant with cervical spine CT earlier this month.   Thoracic spine segmentation:  Normal on previous CTs.   Alignment: Chronic exaggerated lower thoracic kyphosis in conjunction with chronic lower thoracic compression fractures, stable from CT last year. No significant thoracic scoliosis or spondylolisthesis.   Vertebrae: Chronic superior endplate compression fractures of T10, T11 and T12 are stable since last year. No associated marrow edema. Normal background bone marrow signal. Small chronic sclerotic focus superimposed in the right T10 vertebral body also stable by CT since last year and compatible with benign etiology, probably bone island. Stable, maintained thoracic vertebral height elsewhere. No marrow edema or evidence of acute osseous abnormality.   Cord: Normal. Capacious thoracic spinal canal at most levels, see details below. Conus medullaris mostly visible at T12-L1 and appears to be normal.   Paraspinal and other soft tissues: Stable, negative.   Disc levels:   No age advanced thoracic spine degeneration   T1-T2 through T7-T8.   T8-T9: Circumferential disc bulge with mild to moderate facet and ligament flavum hypertrophy. No spinal stenosis. Mild left and moderate to severe right T8 neural foraminal stenosis.   T9-T10: Negative disc. Moderate to severe facet hypertrophy. Mild left and moderate to severe right T9 foraminal stenosis.  T10-T11: Minor disc bulging. Moderate facet and ligament flavum hypertrophy. Mild  T10 foraminal stenosis.   T11-T12: Disc osteophyte complex in part related to chronic retropulsion of the posterosuperior T12 endplate, stable from last year. Moderate posterior element hypertrophy. Borderline to mild spinal stenosis. No cord mass effect. Mild bilateral T11 foraminal stenosis.   IMPRESSION: 1. No acute or suspicious osseous abnormality in the thoracic spine. Chronic T10, T11, and T12 compression fractures are stable from last year. 2. Mild for age thoracic spine degeneration above T8-T9. Mild chronic T12 retropulsion contributes to borderline or mild spinal stenosis at T11-T12. Lower thoracic facet arthropathy results in moderate to severe neural foraminal stenosis at the right T8 and T9 nerve levels.   Electronically Signed   By: VEAR Hurst M.D.   On: 08/08/2024 10:57  MRI LUMBAR SPINE 07/01/2024 02:37:10 PM   TECHNIQUE: Multiplanar multisequence MRI of the lumbar spine was performed without the administration of intravenous contrast.   COMPARISON: MRI of the lumbar spine dated 04/04/2023.   CLINICAL HISTORY: Low back pain, cauda equina syndrome suspected. Pt reports lower back pain that radiates to his abdomen and is now causing him to leak urine. Pt has prostate hx and chronic back pain but the incontinence is new.   FINDINGS:   BONES AND ALIGNMENT: Moderate chronic compression deformity of T12, unchanged from prior study. The vertebral body has lost about 50% of its height anteriorly and there is a chronic schmorl's node within the superior endplate. There is chronic downward bowing of the superior endplates of L1 and L2, as before.   SPINAL CORD: The conus medullaris terminates at L1.   SOFT TISSUES: No paraspinal mass.   L1-L2: No significant disc herniation. No spinal canal stenosis or neural foraminal narrowing.   L2-L3: Mild diffuse disc bulging with mild bilateral lateral recess stenosis.   L3-L4: Slight disc bulging without  significant spinal canal or neural foraminal stenosis.   L4-L5: No significant disc herniation. No spinal canal stenosis or neural foraminal narrowing.   L5-S1: Slight disc bulging without significant spinal canal or neural foraminal stenosis.   OTHER FINDINGS: The left kidney is not visualized.   IMPRESSION: 1. No evidence of cauda equina syndrome. 2. Moderate chronic compression deformity of T12, unchanged. 3. Chronic downward bowing of the superior endplates of L1 and L2, as before. 4. Mild diffuse disc bulging at L2-3 with mild bilateral lateral recess stenosis. 5. Slight disc bulging at L3-4 and L5-S1 without significant spinal canal or neural foraminal stenosis.  PATIENT SURVEYS:  MODI: 68% self-perceived severe disability  COGNITION: Overall cognitive status: Within functional limits for tasks assessed     SENSATION: Light touch: Impaired   MUSCLE LENGTH: Hamstrings: Right 48 deg; Left 33 deg Thomas test: NT  POSTURE: rounded shoulders and forward head  PALPATION: Moderate tenderness in B lumbar paraspinals/ SI region.  Pt. Reports B LE radicular symptoms with prolonged standing.    LUMBAR ROM:  Flexion: limited 25%, extension: limited 50%, L/R rotn. Limited 25% and L/R lateral flexion WFL (L sided rib pain).  5/10 L thoracic/ rib pain.  Mid-thoracic/ lumbar hypomobility with assessment of central/unilateral mobs.    CERVICAL ROM:      Flexion: 41 deg./ extension 35 deg./ L rotn. 44 deg./ R rotn. 48 deg./ R lat. Flexion 40 deg./ L lateral flexion.   B shoulder AROM WFL   LOWER EXTREMITY MMT:    MMT Right eval Left eval  Hip flexion 4+ 4+  Hip extension  Hip abduction 4+ 4+  Hip adduction    Hip internal rotation 5 5  Hip external rotation 5 5  Knee flexion 5 5  Knee extension 5 5  Ankle dorsiflexion    Ankle plantarflexion    Ankle inversion    Ankle eversion     (Blank rows = not tested)  LUMBAR SPECIAL TESTS:  (+) SLR on L.  FUNCTIONAL  TESTS:  TBD  GAIT: Distance walked: in clinic Assistive device utilized: Single point cane Level of assistance: Modified independence Comments: antalgic gait with increase c/o back pain with distance walked.     TREATMENT DATE: 11/06/2024         SUBJECTIVE STATEMENT:   Patient reports 2/10 pain at arrival to PT. Patient reports paresthesias/radiating symptoms to L>RUE and into lower extremities R>LLE. Patient reports some LLE pain initially with cycling at Level-5 resistance. Patient reports LLE symptoms primarily with getting started this AM. Pt reports hip pain with radiating down to L knee on L side. Pt reports notable fasciculations in lower extremities with prolonged walking and high volume of stair climbing.    General traction of lumbar spine in hooklying: Feels better per pt for low back/flank pain   Therapeutic Exercise - for improved soft tissue flexibility and extensibility as needed for ROM, improved strength as needed to improve performance of CKC activities/functional movements   NuStep; Level 5, decreased to 4 at 3 min; x 8 minutes - for improved soft tissue mobility and increased tissue temperature to improve muscle performance   -subjective gathered during this time  5xSTS completed:  -26.6 sec  Prone on elbows; x 2 min  -pain in thoracic spine and radiating toward abdomen, no leg symptoms presently   Self-traction; reviewed    Neuromuscular Re-education - for nervous system downregulation/pain modulation  STM and IASTM with Hypervolt along T7-T12 L>R thoracic paraspinals, bilateral lumbar erector spinae; x 15 minutes    PATIENT EDUCATION:  Education details: Spine hypomobility/ functional Nurse, Children's Person educated: Patient Education method: Explanation Education comprehension: verbalized understanding  HOME EXERCISE PROGRAM: Access Code: [email or web address] URL: [email or web address] Date: 11/06/2024 Prepared by: Venetia Endo  Exercises - Static Prone on Elbows  - 3-4 x daily - 7 x weekly - 1 sets - 3-4 min hold - Hooklying Lumbar Traction  - 7 x weekly - 10 reps - 5-10sec hold - Supine Lower Trunk Rotation  - 2 x daily - 7 x weekly - 2 sets - 10 reps - Supine Bridge  - 2 x daily - 7 x weekly - 2 sets - 10 reps - Seated Scapular Retraction  - 2 x daily - 7 x weekly - 2 sets - 10 reps - 3sec hold   ASSESSMENT:  CLINICAL IMPRESSION: Patient is a pleasant 58 y.o. male who was seen today for physical therapy evaluation and treatment for cervical/thoracic/lumbar pain and generalized deconditioning.  Pt. Presents with limited spinal mobility and chronic back pain with B LE radicular symptoms.  Pt. Is currently not participating with exercise program and will benefit from skilled PT services to increase spinal mobility/ generalized strengthening and conditioning to progress to [employer] ex. Program/ walking program.   OBJECTIVE IMPAIRMENTS: Abnormal gait, decreased activity tolerance, decreased balance, decreased coordination, decreased endurance, decreased mobility, difficulty walking, decreased ROM, decreased strength, hypomobility, impaired flexibility, improper body mechanics, postural dysfunction, and pain.   ACTIVITY LIMITATIONS: carrying, lifting, bending, standing, squatting, and locomotion level  PARTICIPATION LIMITATIONS: cleaning and community activity  PERSONAL FACTORS: Fitness  and Past/current experiences are also affecting patient's functional outcome.   REHAB POTENTIAL: Good  CLINICAL DECISION MAKING: Evolving/moderate complexity  EVALUATION COMPLEXITY: Moderate   GOALS: Goals reviewed with patient? Yes  SHORT TERM GOALS: Target date: 11/24/24  Pt. Will be independent with HEP to increase B hip strength 1/2 muscle grade to improve walking/ stair climbing.   Baseline:  see above Goal status: INITIAL  2.  Pt. Able to complete 30 minutes of exercise with no increase c/o back pain or  radicular symptoms to improve pain-free mobility.  Baseline:  5/10 back pain at rest and increase pain/ radicular symptoms with activity Goal status: INITIAL  LONG TERM GOALS: Target date: 12/15/24  Pt. Will decrease MODI to <50% to improve self-perceived pain-free mobility. Baseline: 68% self-perceived severe disability Goal status: INITIAL  2.  Pt. Will complete 5xSTS and decrease time by 3 seconds to improve pain-free mobility.  Baseline: *** Goal status: INITIAL  3.  Pt. Able to walk 5 blocks from house to regular meetings with no increase c/o back pain/ improved endurance.   Baseline: limited walking distance due to moderate back pain.   Goal status: INITIAL  4.  Pt. Will join/ participate with exercise program at [facility] with no increase c/o back pain.   Baseline: currently not exercising Goal status: INITIAL  PLAN:  PT FREQUENCY: 2x/week  PT DURATION: 6 weeks  PLANNED INTERVENTIONS: 97110-Therapeutic exercises, 97530- Therapeutic activity, [id number]- Neuromuscular re-education, 97535- Self Care, [id number]- Manual therapy, [phone]- Gait training, [phone]- Electrical stimulation (unattended), and Patient/Family education.  PLAN FOR NEXT SESSION: Complete functional test (5xSTS).  Issue HEP for generalized mobility/ conditioning.     Venetia Endo, PT, DPT [phone]  11/06/2024, 7:31 AM

## 2024-11-08 ENCOUNTER — Encounter: Payer: Self-pay | Admitting: Physical Therapy

## 2024-11-10 ENCOUNTER — Ambulatory Visit (INDEPENDENT_AMBULATORY_CARE_PROVIDER_SITE_OTHER)

## 2024-11-10 ENCOUNTER — Encounter (INDEPENDENT_AMBULATORY_CARE_PROVIDER_SITE_OTHER): Payer: Self-pay

## 2024-11-10 VITALS — BP 146/90 | HR 91 | Temp 98.0°F | Wt 215.0 lb

## 2024-11-10 DIAGNOSIS — D170 Benign lipomatous neoplasm of skin and subcutaneous tissue of head, face and neck: Secondary | ICD-10-CM

## 2024-11-10 DIAGNOSIS — Z09 Encounter for follow-up examination after completed treatment for conditions other than malignant neoplasm: Secondary | ICD-10-CM

## 2024-11-10 DIAGNOSIS — R220 Localized swelling, mass and lump, head: Secondary | ICD-10-CM

## 2024-11-11 ENCOUNTER — Ambulatory Visit: Admitting: Physical Therapy

## 2024-11-12 NOTE — Progress Notes (Signed)
 Discussed the use of AI scribe software for clinical note transcription with the patient, who gave verbal consent to proceed.  History of Present Illness Frank Terry is a 58 year old male, status post excision of left brow lipoma, who presents for postoperative follow-up.  Postoperative edema and ecchymosis - Significant swelling and ecchymosis above the left brow following excision - Residual but improving edema and ecchymosis at this time  Postoperative pain - Pain has been mild and well tolerated - No requirement for analgesic medications during recovery  Pathology results - Not aware of the pathology results  Facial pain and irritation resolved.   Physical Exam HEENT: Atraumatic, normocephalic. No incision, dehiscence, erythema, or tenderness above left brow.    Assessment and Plan Assessment & Plan Status post excision of left brow lipoma Recovering well post-excision. Pathology confirmed lipoma. Mild pain, no analgesics needed. Surgical site healing well without complications. - Reviewed pathology report confirming lipoma. - Assessed surgical site for healing and absence of complications.  Discussed scar care- sunscreen, silicone sheets, scar cream, sun protection and massage.

## 2024-11-17 ENCOUNTER — Ambulatory Visit: Admitting: Physical Therapy

## 2024-11-17 ENCOUNTER — Encounter: Payer: Self-pay | Admitting: Physical Therapy

## 2024-11-17 DIAGNOSIS — M5459 Other low back pain: Secondary | ICD-10-CM | POA: Diagnosis not present

## 2024-11-17 DIAGNOSIS — M2560 Stiffness of unspecified joint, not elsewhere classified: Secondary | ICD-10-CM

## 2024-11-17 DIAGNOSIS — G8929 Other chronic pain: Secondary | ICD-10-CM

## 2024-11-17 DIAGNOSIS — R5381 Other malaise: Secondary | ICD-10-CM

## 2024-11-17 DIAGNOSIS — M542 Cervicalgia: Secondary | ICD-10-CM

## 2024-11-17 DIAGNOSIS — M6281 Muscle weakness (generalized): Secondary | ICD-10-CM

## 2024-11-17 NOTE — Therapy (Signed)
 " OUTPATIENT PHYSICAL THERAPY THORACOLUMBAR TREATMENT  Patient Name: Frank Terry MRN: [id number] DOB:1966-02-09, 58 y.o., male Today's Date: 11/17/2024  END OF SESSION:  PT End of Session - 11/17/24 0803     Visit Number 3    Number of Visits 12    Date for Recertification  12/15/24    PT Start Time 0811    PT Stop Time 0900    PT Time Calculation (min) 49 min    Activity Tolerance Patient tolerated treatment well;Patient limited by pain    Behavior During Therapy [facility] for tasks assessed/performed          Past Medical History:  Diagnosis Date   Alcohol use disorder    pt currently lives at the [facility]   Allergy    Anxiety    Arthritis    Asthma    well controlled   Compression fracture of spine (HCC)    Congenital absence of one kidney    pt has right kidney   Depression    Gastric ulcer    GERD (gastroesophageal reflux disease)    no meds   Gout    HTN (hypertension)    Hyponatremia    Osteoporosis    Prostate cancer (HCC)    Ulcer    Vasitis 05/2023   Past Surgical History:  Procedure Laterality Date   COLONOSCOPY WITH ESOPHAGOGASTRODUODENOSCOPY (EGD)     EXCISION MASS HEAD Left 10/29/2024   Procedure: Facial mass excision;  Surgeon: Anice Riis, DO;  Location: [facility];  Service: ENT;  Laterality: Left;   MASS EXCISION Left 09/19/2023   Procedure: EXCISION MASS, deep soft tissue mass back;  Surgeon: Lane Shope, MD;  Location: [facility];  Service: General;  Laterality: Left;   PELVIC LYMPH NODE DISSECTION Bilateral 06/18/2023   Procedure: PELVIC LYMPH NODE DISSECTION;  Surgeon: Riis Knee, MD;  Location: [facility];  Service: Urology;  Laterality: Bilateral;   PROSTATE BIOPSY     ROBOT ASSISTED LAPAROSCOPIC RADICAL PROSTATECTOMY N/A 06/18/2023   Procedure: XI ROBOTIC ASSISTED LAPAROSCOPIC RADICAL PROSTATECTOMY;  Surgeon: Riis Knee, MD;  Location: [facility];  Service: Urology;  Laterality: N/A;   TONSILLECTOMY     age 58    Patient Active Problem List   Diagnosis Date Noted   Radicular pain of thoracic region 11/03/2024   Foraminal stenosis of thoracic region 11/03/2024   Thoracic facet joint syndrome 11/03/2024   History of substance abuse (HCC) 11/03/2024   Chronic pain syndrome 11/03/2024   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   New daily persistent headache 09/25/2023   Soft tissue mass 09/11/2023   Nodule of skin of back 08/16/2023   Nodule of skin of head 08/16/2023   Blurry vision 08/16/2023   Allergic rhinitis 08/10/2023   Osteoporosis 08/10/2023   Skin lesion 08/10/2023   Fatigue 04/06/2023   Knee pain, bilateral 03/19/2023   Compression fracture of spine (HCC) 03/19/2023   Anxious mood 03/19/2023   B12 deficiency 03/19/2023   Hypertension 03/03/2023   Gastric ulcer 03/03/2023   Hyponatremia 03/02/2023   Asthma, chronic 03/02/2023   Gout 03/02/2023   Prostate cancer (HCC) 03/02/2023   Spondylosis of lumbar region without myelopathy or radiculopathy 03/05/2020   Diverticulosis of intestine with bleeding 01/28/2016   Congenital absence of left kidney 01/28/2016   PCP: Theophilus Pagan, MD  REFERRING PROVIDER: Hilma Hastings, PA-C  REFERRING DIAG:  Diagnosis  M47.816 (ICD-10-CM) - Lumbar spondylosis  M54.16 (ICD-10-CM) - Lumbar radiculopathy  M54.12 (ICD-10-CM) - Cervical radiculopathy  F52.187 (ICD-10-CM) - Cervical spondylosis  M47.814 (ICD-10-CM) - Thoracic spondylosis   Rationale for Evaluation and Treatment: Rehabilitation  THERAPY DIAG:  Other low back pain  Chronic left-sided thoracic back pain  Neck pain  Joint stiffness of spine  Muscle weakness (generalized)  Physical deconditioning  ONSET DATE: chronic  SUBJECTIVE:                                                                                                                                                                                           SUBJECTIVE STATEMENT: Pt. Referred to PT with chronic h/o  neck/thoracic/lumbar spine pain.  Pt. Currently on disability since 2020 secondary to back pain.  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  Pt. Has been living at [facility] in [place] for past 1.5 years of sober living.  Pt. Has 4 stairs to enter/exit house.  Pt. Received an injection in back from Dr. Marcelino but pain returned in <1 week.  Pt. Currently ambulates with SPC and limited with walking distance.  Pt. Was able to walk 5 blocks in [place] in past but currently limited by pain/ deconditioning.  Pt. Not driving and hoping to join [employer] with [employer] membership.  Pt. Has received PT in past with Lupe Plump at [facility] s/p prostate surgery.    PERTINENT HISTORY:  History of present illness (HPI) [name], a 58 y.o. year old male, is here today because of his Radicular pain of thoracic region [M54.14]. Frank Terry's primary complain today is Back Pain   Pertinent problems: Frank Terry has Asthma, chronic; Prostate cancer (HCC); Gastric ulcer; Knee pain, bilateral; Compression fracture of spine (HCC); Spondylosis of lumbar region without myelopathy or radiculopathy; Congenital absence of left kidney; Radicular pain of thoracic region; Foraminal stenosis of thoracic region; Thoracic facet joint syndrome; History of substance abuse (HCC); and Chronic pain syndrome on their pertinent problem list.   Pain Assessment: Severity of Chronic pain is reported as a 4 /10. Location: Back Left, Mid, Lower/Radiates into abdomen. Onset: More than a month ago. Quality: Sharp, Aching, Constant. Timing: Constant. Modifying factor(s): Denies. Vitals:  height is 5' 9 (1.753 m) and weight is 215 lb (97.5 kg). His temporal temperature is 97.2 F (36.2 C) (abnormal). His blood pressure is 145/87 (abnormal) and his pulse is 109 (abnormal). His respiration is 20 and oxygen saturation is 100%.  BMI: Estimated body mass index is 31.75 kg/m as calculated from the following:   Height as of this  encounter: 5' 9 (1.753 m).   Weight as of this encounter: 215 lb (97.5 kg).   Last encounter:  Visit date not found. Last procedure: Visit date not found.   Reason for encounter: post-procedure evaluation and assessment.    Discussed the use of AI scribe software for clinical note transcription with the patient, who gave verbal consent to proceed.   History of Present Illness   He is a 58 year old male who presents with persistent thoracic pain following a thoracic epidural steroid injection.   He underwent a thoracic epidural steroid injection on October 08, 2024. Initially, he experienced complete pain relief immediately after the procedure, but the pain returned to a level of six out of ten within four to six hours. He reported approximately 50% pain relief for the first week post-procedure, but no relief thereafter.   Prior to the procedure, his pain level was consistently around six out of ten, worsening throughout the day from a two or three in the morning to a six by noon. The pain is described as a shooting pain across the abdomen, radiating from the back around to the front. Previously, lying down provided relief, but now it does not, and he has experienced several sleepless nights due to discomfort.   He takes Advil  and Tylenol , typically once or twice a day, to manage the pain. He has also used ice and heat therapy post-procedure, applying ice for six to eight hours on the day of the procedure and heat the following day.   He denied engaging in any work or activities that could have caused injury. Bending or stretching does not significantly increase his pain. However, activities such as standing or sitting upright for extended periods, or bending slightly while performing tasks, exacerbate his symptoms.   He has no history of diabetes, and his glucose levels have been normal in the past. He recalls having his sugar levels checked by his primary care doctor, but cannot remember the  exact timing of the last test.     Procedure Type:  Inter-Laminar Thoracic Epidural Steroid Block/Injection  #1  Laterality: Left Level: T10-11  DOS: 10/08/2024 Performed by: Wallie Sherry, MD Imaging: Fluoroscopic guidance Anesthesia: Local anesthesia (1-2% Lidocaine ) Purpose: Diagnostic/Therapeutic Indications: Thoracic back pain, radicular pain, with degenerative disc disease severe enough to impact quality of life or function. 1. Radicular pain of thoracic region   2. Foraminal stenosis of thoracic region   3. Compression fracture of thoracic vertebra, unspecified thoracic vertebral level, sequela     NAS-11 Pain score:        Pre-procedure: 4 /10        Post-procedure: 4 /10    Post-Procedure Evaluation    Effectiveness:  Initial hour after procedure: 100 % . Subsequent 4-6 hours post-procedure: 100 % . Analgesia past initial 6 hours: 50 % . Ongoing improvement:  Analgesic:  50% (approximately 7 days, and then pain return to baseline) Function: Somewhat improved ROM: Somewhat improved Interpretation: Frank Terry underwent a diagnostic/therapeutic Inter-Laminar Thoracic Epidural Steroid Block/Injection on October 08, 2024.  He reports initially 100% pain relief and functional improvement during local anesthetic phase, followed by sustained ongoing 50% pain relief and functional improvement approximately 7 days and then pain returned to baseline.   PAIN:  Are you having pain? Yes: NPRS scale: 5/10 Pain location: cervical/thoracic/lumbar spine Pain description: sharp in L thoracic spine/ribs/ radiating pain Aggravating factors: prolonged standing/ increase movement Relieving factors: short-term relief with injection  PRECAUTIONS: None  RED FLAGS: None   WEIGHT BEARING RESTRICTIONS: No  FALLS:  Has patient fallen in last 6 months? No  LIVING ENVIRONMENT:  Lives with: lives in an adult home Lives in: House/apartment Stairs: Yes: External: 4 steps; none Has following  equipment at home: Single point cane  OCCUPATION: On disability  PLOF: Independent  PATIENT GOALS: Decrease neck/back pain and increase overall conditioning to improve walking endurance/ functional mobility.    NEXT MD VISIT: 11/05/24  OBJECTIVE:  Note: Objective measures were completed at Evaluation unless otherwise noted.  DIAGNOSTIC FINDINGS:  EXAM: MRI CERVICAL SPINE WITHOUT CONTRAST 08/12/2024 07:42:00 AM   TECHNIQUE: Multiplanar multisequence MRI of the cervical spine was performed.   COMPARISON: CT of the cervical spine dated 07/30/2024.   CLINICAL HISTORY: Abnormal MRI. Weakness in limbs with pain in neck. HA's. Numb/ting in all extremities for 6 months.   FINDINGS:   BONES AND ALIGNMENT: Normal alignment. Normal vertebral body heights. Bone marrow signal is unremarkable.   SPINAL CORD: Normal spinal cord size. No abnormal spinal cord signal. No apparent impingement of the spinal cord.   SOFT TISSUES: No paraspinal mass.   C2-C3: No significant disc herniation. No spinal canal stenosis or neural foraminal narrowing.   C3-C4: Minimal disc bulging and mild bilateral uncovertebral joint hypertrophy with mild central spinal canal stenosis. The neural foramina are patent.   C4-C5: No significant disc herniation. No spinal canal stenosis or neural foraminal narrowing.   C5-C6: Broad-based disc bulge and endplate ridging with mild central spinal canal stenosis and mild right neural foraminal stenosis.   C6-C7: Diffuse disc bulging and left-sided uncovertebral joint hypertrophy, with mild left spinal canal and left neural foraminal stenosis.   C7-T1: No significant disc herniation. No spinal canal stenosis or neural foraminal narrowing.   IMPRESSION: 1. Mild central spinal canal stenosis at C3-4, C5-6, and left C6-7. 2. Mild right neural foraminal stenosis at C5-6 and mild left neural foraminal stenosis at C6-7. 3. No apparent impingement of the  spinal cord or nerve roots.   Electronically signed by: Evalene Coho MD 08/12/2024 08:16 AM  EXAM: MRI THORACIC SPINE WITHOUT CONTRAST   TECHNIQUE: Multiplanar, multisequence MR imaging of the thoracic spine was performed. No intravenous contrast was administered.   COMPARISON:  CT cervical spine 07/30/2024. CTA chest 03/20/2024. CT Abdomen and Pelvis 07/14/2023.   FINDINGS: Limited cervical spine imaging: Largely negative for age, concordant with cervical spine CT earlier this month.   Thoracic spine segmentation:  Normal on previous CTs.   Alignment: Chronic exaggerated lower thoracic kyphosis in conjunction with chronic lower thoracic compression fractures, stable from CT last year. No significant thoracic scoliosis or spondylolisthesis.   Vertebrae: Chronic superior endplate compression fractures of T10, T11 and T12 are stable since last year. No associated marrow edema. Normal background bone marrow signal. Small chronic sclerotic focus superimposed in the right T10 vertebral body also stable by CT since last year and compatible with benign etiology, probably bone island. Stable, maintained thoracic vertebral height elsewhere. No marrow edema or evidence of acute osseous abnormality.   Cord: Normal. Capacious thoracic spinal canal at most levels, see details below. Conus medullaris mostly visible at T12-L1 and appears to be normal.   Paraspinal and other soft tissues: Stable, negative.   Disc levels:   No age advanced thoracic spine degeneration   T1-T2 through T7-T8.   T8-T9: Circumferential disc bulge with mild to moderate facet and ligament flavum hypertrophy. No spinal stenosis. Mild left and moderate to severe right T8 neural foraminal stenosis.   T9-T10: Negative disc. Moderate to severe facet hypertrophy. Mild left and moderate to severe right T9 foraminal stenosis.  T10-T11: Minor disc bulging. Moderate facet and ligament flavum hypertrophy. Mild  T10 foraminal stenosis.   T11-T12: Disc osteophyte complex in part related to chronic retropulsion of the posterosuperior T12 endplate, stable from last year. Moderate posterior element hypertrophy. Borderline to mild spinal stenosis. No cord mass effect. Mild bilateral T11 foraminal stenosis.   IMPRESSION: 1. No acute or suspicious osseous abnormality in the thoracic spine. Chronic T10, T11, and T12 compression fractures are stable from last year. 2. Mild for age thoracic spine degeneration above T8-T9. Mild chronic T12 retropulsion contributes to borderline or mild spinal stenosis at T11-T12. Lower thoracic facet arthropathy results in moderate to severe neural foraminal stenosis at the right T8 and T9 nerve levels.   Electronically Signed   By: VEAR Hurst M.D.   On: 08/08/2024 10:57  MRI LUMBAR SPINE 07/01/2024 02:37:10 PM   TECHNIQUE: Multiplanar multisequence MRI of the lumbar spine was performed without the administration of intravenous contrast.   COMPARISON: MRI of the lumbar spine dated 04/04/2023.   CLINICAL HISTORY: Low back pain, cauda equina syndrome suspected. Pt reports lower back pain that radiates to his abdomen and is now causing him to leak urine. Pt has prostate hx and chronic back pain but the incontinence is new.   FINDINGS:   BONES AND ALIGNMENT: Moderate chronic compression deformity of T12, unchanged from prior study. The vertebral body has lost about 50% of its height anteriorly and there is a chronic schmorl's node within the superior endplate. There is chronic downward bowing of the superior endplates of L1 and L2, as before.   SPINAL CORD: The conus medullaris terminates at L1.   SOFT TISSUES: No paraspinal mass.   L1-L2: No significant disc herniation. No spinal canal stenosis or neural foraminal narrowing.   L2-L3: Mild diffuse disc bulging with mild bilateral lateral recess stenosis.   L3-L4: Slight disc bulging without  significant spinal canal or neural foraminal stenosis.   L4-L5: No significant disc herniation. No spinal canal stenosis or neural foraminal narrowing.   L5-S1: Slight disc bulging without significant spinal canal or neural foraminal stenosis.   OTHER FINDINGS: The left kidney is not visualized.   IMPRESSION: 1. No evidence of cauda equina syndrome. 2. Moderate chronic compression deformity of T12, unchanged. 3. Chronic downward bowing of the superior endplates of L1 and L2, as before. 4. Mild diffuse disc bulging at L2-3 with mild bilateral lateral recess stenosis. 5. Slight disc bulging at L3-4 and L5-S1 without significant spinal canal or neural foraminal stenosis.  PATIENT SURVEYS:  MODI: 68% self-perceived severe disability  COGNITION: Overall cognitive status: Within functional limits for tasks assessed     SENSATION: Light touch: Impaired   MUSCLE LENGTH: Hamstrings: Right 48 deg; Left 33 deg Thomas test: NT  POSTURE: rounded shoulders and forward head  PALPATION: Moderate tenderness in B lumbar paraspinals/ SI region.  Pt. Reports B LE radicular symptoms with prolonged standing.    LUMBAR ROM:  Flexion: limited 25%, extension: limited 50%, L/R rotn. Limited 25% and L/R lateral flexion WFL (L sided rib pain).  5/10 L thoracic/ rib pain.  Mid-thoracic/ lumbar hypomobility with assessment of central/unilateral mobs.    CERVICAL ROM:      Flexion: 41 deg./ extension 35 deg./ L rotn. 44 deg./ R rotn. 48 deg./ R lat. Flexion 40 deg./ L lateral flexion.   B shoulder AROM WFL   LOWER EXTREMITY MMT:    MMT Right eval Left eval  Hip flexion 4+ 4+  Hip extension  Hip abduction 4+ 4+  Hip adduction    Hip internal rotation 5 5  Hip external rotation 5 5  Knee flexion 5 5  Knee extension 5 5  Ankle dorsiflexion    Ankle plantarflexion    Ankle inversion    Ankle eversion     (Blank rows = not tested)  LUMBAR SPECIAL TESTS:  (+) SLR on L.  FUNCTIONAL  TESTS:  5xSTS: 26.6 sec  GAIT: Distance walked: in clinic Assistive device utilized: Single point cane Level of assistance: Modified independence Comments: antalgic gait with increase c/o back pain with distance walked.    5xSTS completed:  -26.6 sec  TREATMENT DATE: 11/17/2024        SUBJECTIVE STATEMENT:   Patient reports 3/10 L low back pain with referral to abdominal region at arrival to PT and no LE radicular symptoms.  Pt. Had a scooter incident on 12/19 while working on scooter in driveway and was dragged onto yard with R sided/thoracic/rib discomfort and clicking reported.  No bruising noted and no pain with deep breaths.    Therapeutic Exercise - for improved soft tissue flexibility and extensibility as needed for ROM, improved strength as needed to improve performance of CKC activities/functional movements  NuStep L4 for 10 min. B UE/LE to improve soft tissue mobility and increased tissue temperature to improve muscle performance   -subjective gathered during this time  Prone on elbows; x 5 with holds  -no change in pain symptoms.    Hooklying, lower trunk rotations; 1 x 10 alt R/L  Bridge; 2 x 10.  Supine marching 2 x 10.    Reviewed HEP (Frank update next tx. Session)  Neuromuscular Re-education - for nervous system downregulation/pain modulation  MHP utilized along thoracic and lumbar spine before manual work:  Supine LE/lumbar stretches (generalized)- focus on hamstring/ piriformis/ rotn. Stretches.    Prone STM to thoracic/lumbar paraspinals.  Grade III PA unilateral/central mobs from mid-thoracic to lumbar spine.  Significant hypomobility noted in mid-back.     Not today: STM and IASTM with [employer] along T7-T12 L>R thoracic paraspinals, bilateral lumbar erector spinae; x 15 minutes    PATIENT EDUCATION:  Education details: Spine hypomobility/ functional goals/ Artist Person educated: Patient Education method: Explanation Education  comprehension: verbalized understanding  HOME EXERCISE PROGRAM: Access Code: [email or web address] URL: [email or web address] Date: 11/06/2024 Prepared by: Venetia Endo  Exercises - Static Prone on Elbows  - 3-4 x daily - 7 x weekly - 1 sets - 3-4 min hold - Hooklying Lumbar Traction  - 7 x weekly - 10 reps - 5-10sec hold - Supine Lower Trunk Rotation  - 2 x daily - 7 x weekly - 2 sets - 10 reps - Supine Bridge  - 2 x daily - 7 x weekly - 2 sets - 10 reps - Seated Scapular Retraction  - 2 x daily - 7 x weekly - 2 sets - 10 reps - 3sec hold   ASSESSMENT:  CLINICAL IMPRESSION: Pt reports minimal back pain symptoms this morning but continues to c/o referral pain on L low back to L abdominal region.  Pt. C/o new R sided (mid-thoracic) discomfort after scooter incident in driveway.  Pt. Demonstrates good understanding of TrA muscle activation and no increase c/o pain with marching/ bridging/ trunk rotn.   We Frank further progress with graded movement and further conditioning with successive PT visits. Patient Frank benefit from continued skilled PT services to increase spinal mobility/ generalized strengthening and conditioning to progress to [employer]  ex. Program/ walking program.   OBJECTIVE IMPAIRMENTS: Abnormal gait, decreased activity tolerance, decreased balance, decreased coordination, decreased endurance, decreased mobility, difficulty walking, decreased ROM, decreased strength, hypomobility, impaired flexibility, improper body mechanics, postural dysfunction, and pain.   ACTIVITY LIMITATIONS: carrying, lifting, bending, standing, squatting, and locomotion level  PARTICIPATION LIMITATIONS: cleaning and community activity  PERSONAL FACTORS: Fitness and Past/current experiences are also affecting patient's functional outcome.   REHAB POTENTIAL: Good  CLINICAL DECISION MAKING: Evolving/moderate complexity  EVALUATION COMPLEXITY: Moderate   GOALS: Goals reviewed with  patient? Yes  SHORT TERM GOALS: Target date: 11/24/24  Pt. Frank be independent with HEP to increase B hip strength 1/2 muscle grade to improve walking/ stair climbing.   Baseline:  see above Goal status: INITIAL  2.  Pt. Able to complete 30 minutes of exercise with no increase c/o back pain or radicular symptoms to improve pain-free mobility.  Baseline:  5/10 back pain at rest and increase pain/ radicular symptoms with activity Goal status: INITIAL  LONG TERM GOALS: Target date: 12/15/24  Pt. Frank decrease MODI to <50% to improve self-perceived pain-free mobility. Baseline: 68% self-perceived severe disability Goal status: INITIAL  2.  Pt. Frank complete 5xSTS and decrease time by 3 seconds to improve pain-free mobility.  Baseline: 26.6 sec  Goal status: INITIAL  3.  Pt. Able to walk 5 blocks from house to regular meetings with no increase c/o back pain/ improved endurance.   Baseline: limited walking distance due to moderate back pain.   Goal status: INITIAL  4.  Pt. Frank join/ participate with [facility] program at [facility] with no increase c/o back pain.   Baseline: currently not exercising Goal status: INITIAL  PLAN:  PT FREQUENCY: 2x/week  PT DURATION: 6 weeks  PLANNED INTERVENTIONS: 97110-Therapeutic exercises, 97530- Therapeutic activity, [id number]- Neuromuscular re-education, 97535- Self Care, [id number]- Manual therapy, [phone]- Gait training, [phone]- Electrical stimulation (unattended), and Patient/Family education.  PLAN FOR NEXT SESSION: Update HEP for generalized mobility/ conditioning with successive PT visits. F/u on response with initial exercises and progress as tolerated with graded movement and conditioning for improved tolerance of walking/functional mobility tasks.  Frank Terry, PT, DPT # [phone] 11/17/2024, 12:19 PM  "

## 2024-11-19 ENCOUNTER — Encounter: Payer: Self-pay | Admitting: Physical Therapy

## 2024-11-19 ENCOUNTER — Ambulatory Visit: Admitting: Physical Therapy

## 2024-11-19 DIAGNOSIS — R5381 Other malaise: Secondary | ICD-10-CM

## 2024-11-19 DIAGNOSIS — G8929 Other chronic pain: Secondary | ICD-10-CM

## 2024-11-19 DIAGNOSIS — M542 Cervicalgia: Secondary | ICD-10-CM

## 2024-11-19 DIAGNOSIS — M2560 Stiffness of unspecified joint, not elsewhere classified: Secondary | ICD-10-CM

## 2024-11-19 DIAGNOSIS — M6281 Muscle weakness (generalized): Secondary | ICD-10-CM

## 2024-11-19 DIAGNOSIS — M5459 Other low back pain: Secondary | ICD-10-CM
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
## 2024-11-24 ENCOUNTER — Encounter: Payer: Self-pay | Admitting: Physical Therapy

## 2024-11-24 ENCOUNTER — Ambulatory Visit: Attending: Orthopedic Surgery | Admitting: Physical Therapy

## 2024-11-24 DIAGNOSIS — M6281 Muscle weakness (generalized): Secondary | ICD-10-CM | POA: Insufficient documentation

## 2024-11-24 DIAGNOSIS — M5459 Other low back pain: Secondary | ICD-10-CM | POA: Diagnosis present

## 2024-11-24 DIAGNOSIS — M546 Pain in thoracic spine: Secondary | ICD-10-CM | POA: Diagnosis present

## 2024-11-24 DIAGNOSIS — R5381 Other malaise: Secondary | ICD-10-CM | POA: Diagnosis present

## 2024-11-24 DIAGNOSIS — M542 Cervicalgia: Secondary | ICD-10-CM | POA: Insufficient documentation

## 2024-11-24 DIAGNOSIS — G8929 Other chronic pain: Secondary | ICD-10-CM | POA: Diagnosis present

## 2024-11-24 DIAGNOSIS — M2560 Stiffness of unspecified joint, not elsewhere classified: Secondary | ICD-10-CM | POA: Insufficient documentation

## 2024-11-24 NOTE — Therapy (Signed)
 " OUTPATIENT PHYSICAL THERAPY THORACOLUMBAR TREATMENT  Patient Name: Frank Terry MRN: [id number] DOB:09/16/1966, 59 y.o., male Today's Date: 11/24/2024  END OF SESSION:  PT End of Session - 11/24/24 0811     Visit Number 5    Number of Visits 12    Date for Recertification  12/15/24    PT Start Time 0811    PT Stop Time 0903    PT Time Calculation (min) 52 min    Activity Tolerance Patient tolerated treatment well;No increased pain    Behavior During Therapy WFL for tasks assessed/performed          Past Medical History:  Diagnosis Date   Alcohol use disorder    pt currently lives at the [facility]   Allergy    Anxiety    Arthritis    Asthma    well controlled   Compression fracture of spine (HCC)    Congenital absence of one kidney    pt has right kidney   Depression    Gastric ulcer    GERD (gastroesophageal reflux disease)    no meds   Gout    HTN (hypertension)    Hyponatremia    Osteoporosis    Prostate cancer (HCC)    Ulcer    Vasitis 05/2023   Past Surgical History:  Procedure Laterality Date   COLONOSCOPY WITH ESOPHAGOGASTRODUODENOSCOPY (EGD)     EXCISION MASS HEAD Left 10/29/2024   Procedure: Facial mass excision;  Surgeon: Anice Riis, DO;  Location: [facility];  Service: ENT;  Laterality: Left;   MASS EXCISION Left 09/19/2023   Procedure: EXCISION MASS, deep soft tissue mass back;  Surgeon: Lane Shope, MD;  Location: [facility];  Service: General;  Laterality: Left;   PELVIC LYMPH NODE DISSECTION Bilateral 06/18/2023   Procedure: PELVIC LYMPH NODE DISSECTION;  Surgeon: Riis Knee, MD;  Location: [facility];  Service: Urology;  Laterality: Bilateral;   PROSTATE BIOPSY     ROBOT ASSISTED LAPAROSCOPIC RADICAL PROSTATECTOMY N/A 06/18/2023   Procedure: XI ROBOTIC ASSISTED LAPAROSCOPIC RADICAL PROSTATECTOMY;  Surgeon: Riis Knee, MD;  Location: [facility];  Service: Urology;  Laterality: N/A;   TONSILLECTOMY     age 11    Patient Active Problem List   Diagnosis Date Noted   Radicular pain of thoracic region 11/03/2024   Foraminal stenosis of thoracic region 11/03/2024   Thoracic facet joint syndrome 11/03/2024   History of substance abuse (HCC) 11/03/2024   Chronic pain syndrome 11/03/2024   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   New daily persistent headache 09/25/2023   Soft tissue mass 09/11/2023   Nodule of skin of back 08/16/2023   Nodule of skin of head 08/16/2023   Blurry vision 08/16/2023   Allergic rhinitis 08/10/2023   Osteoporosis 08/10/2023   Skin lesion 08/10/2023   Fatigue 04/06/2023   Knee pain, bilateral 03/19/2023   Compression fracture of spine (HCC) 03/19/2023   Anxious mood 03/19/2023   B12 deficiency 03/19/2023   Hypertension 03/03/2023   Gastric ulcer 03/03/2023   Hyponatremia 03/02/2023   Asthma, chronic 03/02/2023   Gout 03/02/2023   Prostate cancer (HCC) 03/02/2023   Spondylosis of lumbar region without myelopathy or radiculopathy 03/05/2020   Diverticulosis of intestine with bleeding 01/28/2016   Congenital absence of left kidney 01/28/2016   PCP: Theophilus Pagan, MD  REFERRING PROVIDER: Hilma Hastings, PA-C  REFERRING DIAG:  Diagnosis  M47.816 (ICD-10-CM) - Lumbar spondylosis  M54.16 (ICD-10-CM) - Lumbar radiculopathy  M54.12 (ICD-10-CM) - Cervical radiculopathy  F52.187 (ICD-10-CM) - Cervical spondylosis  M47.814 (ICD-10-CM) - Thoracic spondylosis   Rationale for Evaluation and Treatment: Rehabilitation  THERAPY DIAG:  Other low back pain  Chronic left-sided thoracic back pain  Neck pain  Joint stiffness of spine  Muscle weakness (generalized)  Physical deconditioning  ONSET DATE: chronic  SUBJECTIVE:                                                                                                                                                                                           SUBJECTIVE STATEMENT: Pt. Referred to PT with chronic h/o  neck/thoracic/lumbar spine pain.  Pt. Currently on disability since 2020 secondary to back pain.  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  Pt. Has been living at [facility] in [place] for past 1.5 years of sober living.  Pt. Has 4 stairs to enter/exit house.  Pt. Received an injection in back from Dr. Marcelino but pain returned in <1 week.  Pt. Currently ambulates with SPC and limited with walking distance.  Pt. Was able to walk 5 blocks in [place] in past but currently limited by pain/ deconditioning.  Pt. Not driving and hoping to join [employer] with [employer] membership.  Pt. Has received PT in past with Lupe Plump at [facility] s/p prostate surgery.    PERTINENT HISTORY:  History of present illness (HPI) Frank Terry, a 59 y.o. year old male, is here today because of his Radicular pain of thoracic region [M54.14]. Frank Terry's primary complain today is Back Pain   Pertinent problems: Frank Terry has Asthma, chronic; Prostate cancer (HCC); Gastric ulcer; Knee pain, bilateral; Compression fracture of spine (HCC); Spondylosis of lumbar region without myelopathy or radiculopathy; Congenital absence of left kidney; Radicular pain of thoracic region; Foraminal stenosis of thoracic region; Thoracic facet joint syndrome; History of substance abuse (HCC); and Chronic pain syndrome on their pertinent problem list.   Pain Assessment: Severity of Chronic pain is reported as a 4 /10. Location: Back Left, Mid, Lower/Radiates into abdomen. Onset: More than a month ago. Quality: Sharp, Aching, Constant. Timing: Constant. Modifying factor(s): Denies. Vitals:  height is 5' 9 (1.753 m) and weight is 215 lb (97.5 kg). His temporal temperature is 97.2 F (36.2 C) (abnormal). His blood pressure is 145/87 (abnormal) and his pulse is 109 (abnormal). His respiration is 20 and oxygen saturation is 100%.  BMI: Estimated body mass index is 31.75 kg/m as calculated from the following:   Height as of this  encounter: 5' 9 (1.753 m).   Weight as of this encounter: 215 lb (97.5 kg).   Last encounter:  Visit date not found. Last procedure: Visit date not found.   Reason for encounter: post-procedure evaluation and assessment.    Discussed the use of AI scribe software for clinical note transcription with the patient, who gave verbal consent to proceed.   History of Present Illness   He is a 59 year old male who presents with persistent thoracic pain following a thoracic epidural steroid injection.   He underwent a thoracic epidural steroid injection on October 08, 2024. Initially, he experienced complete pain relief immediately after the procedure, but the pain returned to a level of six out of ten within four to six hours. He reported approximately 50% pain relief for the first week post-procedure, but no relief thereafter.   Prior to the procedure, his pain level was consistently around six out of ten, worsening throughout the day from a two or three in the morning to a six by noon. The pain is described as a shooting pain across the abdomen, radiating from the back around to the front. Previously, lying down provided relief, but now it does not, and he has experienced several sleepless nights due to discomfort.   He takes Advil  and Tylenol , typically once or twice a day, to manage the pain. He has also used ice and heat therapy post-procedure, applying ice for six to eight hours on the day of the procedure and heat the following day.   He denied engaging in any work or activities that could have caused injury. Bending or stretching does not significantly increase his pain. However, activities such as standing or sitting upright for extended periods, or bending slightly while performing tasks, exacerbate his symptoms.   He has no history of diabetes, and his glucose levels have been normal in the past. He recalls having his sugar levels checked by his primary care doctor, but cannot remember the  exact timing of the last test.     Procedure Type:  Inter-Laminar Thoracic Epidural Steroid Block/Injection  #1  Laterality: Left Level: T10-11  DOS: 10/08/2024 Performed by: Wallie Sherry, MD Imaging: Fluoroscopic guidance Anesthesia: Local anesthesia (1-2% Lidocaine ) Purpose: Diagnostic/Therapeutic Indications: Thoracic back pain, radicular pain, with degenerative disc disease severe enough to impact quality of life or function. 1. Radicular pain of thoracic region   2. Foraminal stenosis of thoracic region   3. Compression fracture of thoracic vertebra, unspecified thoracic vertebral level, sequela     NAS-11 Pain score:        Pre-procedure: 4 /10        Post-procedure: 4 /10    Post-Procedure Evaluation    Effectiveness:  Initial hour after procedure: 100 % . Subsequent 4-6 hours post-procedure: 100 % . Analgesia past initial 6 hours: 50 % . Ongoing improvement:  Analgesic:  50% (approximately 7 days, and then pain return to baseline) Function: Somewhat improved ROM: Somewhat improved Interpretation: Frank Terry underwent a diagnostic/therapeutic Inter-Laminar Thoracic Epidural Steroid Block/Injection on October 08, 2024.  He reports initially 100% pain relief and functional improvement during local anesthetic phase, followed by sustained ongoing 50% pain relief and functional improvement approximately 7 days and then pain returned to baseline.   PAIN:  Are you having pain? Yes: NPRS scale: 5/10 Pain location: cervical/thoracic/lumbar spine Pain description: sharp in L thoracic spine/ribs/ radiating pain Aggravating factors: prolonged standing/ increase movement Relieving factors: short-term relief with injection  PRECAUTIONS: None  RED FLAGS: None   WEIGHT BEARING RESTRICTIONS: No  FALLS:  Has patient fallen in last 6 months? No  LIVING ENVIRONMENT:  Lives with: lives in an adult home Lives in: House/apartment Stairs: Yes: External: 4 steps; none Has following  equipment at home: Single point cane  OCCUPATION: On disability  PLOF: Independent  PATIENT GOALS: Decrease neck/back pain and increase overall conditioning to improve walking endurance/ functional mobility.    NEXT MD VISIT: 11/05/24  OBJECTIVE:  Note: Objective measures were completed at Evaluation unless otherwise noted.  DIAGNOSTIC FINDINGS:  EXAM: MRI CERVICAL SPINE WITHOUT CONTRAST 08/12/2024 07:42:00 AM   TECHNIQUE: Multiplanar multisequence MRI of the cervical spine was performed.   COMPARISON: CT of the cervical spine dated 07/30/2024.   CLINICAL HISTORY: Abnormal MRI. Weakness in limbs with pain in neck. HA's. Numb/ting in all extremities for 6 months.   FINDINGS:   BONES AND ALIGNMENT: Normal alignment. Normal vertebral body heights. Bone marrow signal is unremarkable.   SPINAL CORD: Normal spinal cord size. No abnormal spinal cord signal. No apparent impingement of the spinal cord.   SOFT TISSUES: No paraspinal mass.   C2-C3: No significant disc herniation. No spinal canal stenosis or neural foraminal narrowing.   C3-C4: Minimal disc bulging and mild bilateral uncovertebral joint hypertrophy with mild central spinal canal stenosis. The neural foramina are patent.   C4-C5: No significant disc herniation. No spinal canal stenosis or neural foraminal narrowing.   C5-C6: Broad-based disc bulge and endplate ridging with mild central spinal canal stenosis and mild right neural foraminal stenosis.   C6-C7: Diffuse disc bulging and left-sided uncovertebral joint hypertrophy, with mild left spinal canal and left neural foraminal stenosis.   C7-T1: No significant disc herniation. No spinal canal stenosis or neural foraminal narrowing.   IMPRESSION: 1. Mild central spinal canal stenosis at C3-4, C5-6, and left C6-7. 2. Mild right neural foraminal stenosis at C5-6 and mild left neural foraminal stenosis at C6-7. 3. No apparent impingement of the  spinal cord or nerve roots.   Electronically signed by: Evalene Coho MD 08/12/2024 08:16 AM  EXAM: MRI THORACIC SPINE WITHOUT CONTRAST   TECHNIQUE: Multiplanar, multisequence MR imaging of the thoracic spine was performed. No intravenous contrast was administered.   COMPARISON:  CT cervical spine 07/30/2024. CTA chest 03/20/2024. CT Abdomen and Pelvis 07/14/2023.   FINDINGS: Limited cervical spine imaging: Largely negative for age, concordant with cervical spine CT earlier this month.   Thoracic spine segmentation:  Normal on previous CTs.   Alignment: Chronic exaggerated lower thoracic kyphosis in conjunction with chronic lower thoracic compression fractures, stable from CT last year. No significant thoracic scoliosis or spondylolisthesis.   Vertebrae: Chronic superior endplate compression fractures of T10, T11 and T12 are stable since last year. No associated marrow edema. Normal background bone marrow signal. Small chronic sclerotic focus superimposed in the right T10 vertebral body also stable by CT since last year and compatible with benign etiology, probably bone island. Stable, maintained thoracic vertebral height elsewhere. No marrow edema or evidence of acute osseous abnormality.   Cord: Normal. Capacious thoracic spinal canal at most levels, see details below. Conus medullaris mostly visible at T12-L1 and appears to be normal.   Paraspinal and other soft tissues: Stable, negative.   Disc levels:   No age advanced thoracic spine degeneration   T1-T2 through T7-T8.   T8-T9: Circumferential disc bulge with mild to moderate facet and ligament flavum hypertrophy. No spinal stenosis. Mild left and moderate to severe right T8 neural foraminal stenosis.   T9-T10: Negative disc. Moderate to severe facet hypertrophy. Mild left and moderate to severe right T9 foraminal stenosis.  T10-T11: Minor disc bulging. Moderate facet and ligament flavum hypertrophy. Mild  T10 foraminal stenosis.   T11-T12: Disc osteophyte complex in part related to chronic retropulsion of the posterosuperior T12 endplate, stable from last year. Moderate posterior element hypertrophy. Borderline to mild spinal stenosis. No cord mass effect. Mild bilateral T11 foraminal stenosis.   IMPRESSION: 1. No acute or suspicious osseous abnormality in the thoracic spine. Chronic T10, T11, and T12 compression fractures are stable from last year. 2. Mild for age thoracic spine degeneration above T8-T9. Mild chronic T12 retropulsion contributes to borderline or mild spinal stenosis at T11-T12. Lower thoracic facet arthropathy results in moderate to severe neural foraminal stenosis at the right T8 and T9 nerve levels.   Electronically Signed   By: VEAR Hurst M.D.   On: 08/08/2024 10:57  MRI LUMBAR SPINE 07/01/2024 02:37:10 PM   TECHNIQUE: Multiplanar multisequence MRI of the lumbar spine was performed without the administration of intravenous contrast.   COMPARISON: MRI of the lumbar spine dated 04/04/2023.   CLINICAL HISTORY: Low back pain, cauda equina syndrome suspected. Pt reports lower back pain that radiates to his abdomen and is now causing him to leak urine. Pt has prostate hx and chronic back pain but the incontinence is new.   FINDINGS:   BONES AND ALIGNMENT: Moderate chronic compression deformity of T12, unchanged from prior study. The vertebral body has lost about 50% of its height anteriorly and there is a chronic schmorl's node within the superior endplate. There is chronic downward bowing of the superior endplates of L1 and L2, as before.   SPINAL CORD: The conus medullaris terminates at L1.   SOFT TISSUES: No paraspinal mass.   L1-L2: No significant disc herniation. No spinal canal stenosis or neural foraminal narrowing.   L2-L3: Mild diffuse disc bulging with mild bilateral lateral recess stenosis.   L3-L4: Slight disc bulging without  significant spinal canal or neural foraminal stenosis.   L4-L5: No significant disc herniation. No spinal canal stenosis or neural foraminal narrowing.   L5-S1: Slight disc bulging without significant spinal canal or neural foraminal stenosis.   OTHER FINDINGS: The left kidney is not visualized.   IMPRESSION: 1. No evidence of cauda equina syndrome. 2. Moderate chronic compression deformity of T12, unchanged. 3. Chronic downward bowing of the superior endplates of L1 and L2, as before. 4. Mild diffuse disc bulging at L2-3 with mild bilateral lateral recess stenosis. 5. Slight disc bulging at L3-4 and L5-S1 without significant spinal canal or neural foraminal stenosis.  PATIENT SURVEYS:  MODI: 68% self-perceived severe disability  COGNITION: Overall cognitive status: Within functional limits for tasks assessed     SENSATION: Light touch: Impaired   MUSCLE LENGTH: Hamstrings: Right 48 deg; Left 33 deg Thomas test: NT  POSTURE: rounded shoulders and forward head  PALPATION: Moderate tenderness in B lumbar paraspinals/ SI region.  Pt. Reports B LE radicular symptoms with prolonged standing.    LUMBAR ROM:  Flexion: limited 25%, extension: limited 50%, L/R rotn. Limited 25% and L/R lateral flexion WFL (L sided rib pain).  5/10 L thoracic/ rib pain.  Mid-thoracic/ lumbar hypomobility with assessment of central/unilateral mobs.    CERVICAL ROM:      Flexion: 41 deg./ extension 35 deg./ L rotn. 44 deg./ R rotn. 48 deg./ R lat. Flexion 40 deg./ L lateral flexion.   B shoulder AROM WFL   LOWER EXTREMITY MMT:    MMT Right eval Left eval  Hip flexion 4+ 4+  Hip extension  Hip abduction 4+ 4+  Hip adduction    Hip internal rotation 5 5  Hip external rotation 5 5  Knee flexion 5 5  Knee extension 5 5  Ankle dorsiflexion    Ankle plantarflexion    Ankle inversion    Ankle eversion     (Blank rows = not tested)  LUMBAR SPECIAL TESTS:  (+) SLR on L.  FUNCTIONAL  TESTS:  5xSTS: 26.6 sec  GAIT: Distance walked: in clinic Assistive device utilized: Single point cane Level of assistance: Modified independence Comments: antalgic gait with increase c/o back pain with distance walked.    5xSTS completed:  -26.6 sec  TREATMENT DATE: 11/24/2024        SUBJECTIVE STATEMENT:   Patient reports 2/10 L low back pain with referral to abdominal region at arrival to PT and no LE radicular symptoms.  Joined [employer] and started last night and began Keto diet (11/20/24).  Pt. Planning to attend [employer] on non-PT days.    Therapeutic Exercise: - for improved soft tissue flexibility and extensibility as needed for ROM, improved strength as needed to improve performance of CKC activities/functional movements  NuStep L5 for 10 min. B UE/LE to improve soft tissue mobility and increased tissue temperature to improve muscle performance.  MH applied to back.    Supine marching (BTB)/ bridging with added hip abduction (BTB)/ dead bug 20x each    Hooklying, lower trunk rotations; 1 x 10 alt R/L  Prone on elbows; x 5 with holds (MH to low back at end of tx.)  -no change in pain symptoms.   Nautilus: 40# scap. Retraction 20x/ 40# Pallof press 15x on L/R with cuing for proper technique.    Manual therapy:  for pain modulation  Supine LE/lumbar stretches (generalized)- focus on hamstring/ piriformis/ rotn. Stretches.    Prone STM to thoracic/lumbar paraspinals.  Grade III PA unilateral/central mobs from mid-thoracic to lumbar spine.  Significant hypomobility noted in mid-back.    STM and IASTM with [employer] along T7-T12 thoracic paraspinals, bilateral lumbar erector spinae in sitting; x 8 minutes    PATIENT EDUCATION:  Education details: Spine hypomobility/ functional goals/ Artist Person educated: Patient Education method: Explanation Education comprehension: verbalized understanding  HOME EXERCISE PROGRAM: Access Code: [email or web address] URL:  [email or web address] Date: 11/06/2024 Prepared by: Venetia Endo  Exercises - Static Prone on Elbows  - 3-4 x daily - 7 x weekly - 1 sets - 3-4 min hold - Hooklying Lumbar Traction  - 7 x weekly - 10 reps - 5-10sec hold - Supine Lower Trunk Rotation  - 2 x daily - 7 x weekly - 2 sets - 10 reps - Supine Bridge  - 2 x daily - 7 x weekly - 2 sets - 10 reps - Seated Scapular Retraction  - 2 x daily - 7 x weekly - 2 sets - 10 reps - 3sec hold  Access Code: [email or web address] URL: [email or web address] Date: 11/19/2024 Prepared by: Ozell Sero  Exercises - Static Prone on Elbows  - 3-4 x daily - 7 x weekly - 1 sets - 3-4 min hold - Hooklying Lumbar Traction  - 7 x weekly - 10 reps - 5-10sec hold - Supine Lower Trunk Rotation  - 2 x daily - 7 x weekly - 2 sets - 10 reps - Supine Bridge  - 2 x daily - 7 x weekly - 2 sets - 10 reps - Scapular Retraction with Resistance  - 1 x daily - 4 x weekly - 2  sets - 15 reps - Shoulder W - External Rotation with Resistance  - 1 x daily - 4 x weekly - 2 sets - 15 reps   ASSESSMENT:  CLINICAL IMPRESSION: Pt reports minimal back pain symptoms this morning but continues to c/o referral pain on L low back to L abdominal region.  Pt. Demonstrates good understanding of TrA muscle activation and no increase c/o pain with resisted marching/ bridging/ trunk rotn.   Pt. Understands progression of HEP and demonstrates good technique.  No increase pain with addition of Pallof press and recent start at [employer].  Patient will benefit from continued skilled PT services to increase spinal mobility/ generalized strengthening and conditioning to progress to [employer] ex. Program/ walking program.   OBJECTIVE IMPAIRMENTS: Abnormal gait, decreased activity tolerance, decreased balance, decreased coordination, decreased endurance, decreased mobility, difficulty walking, decreased ROM, decreased strength, hypomobility, impaired flexibility,  improper body mechanics, postural dysfunction, and pain.   ACTIVITY LIMITATIONS: carrying, lifting, bending, standing, squatting, and locomotion level  PARTICIPATION LIMITATIONS: cleaning and community activity  PERSONAL FACTORS: Fitness and Past/current experiences are also affecting patient's functional outcome.   REHAB POTENTIAL: Good  CLINICAL DECISION MAKING: Evolving/moderate complexity  EVALUATION COMPLEXITY: Moderate   GOALS: Goals reviewed with patient? Yes  SHORT TERM GOALS: Target date: 11/24/24  Pt. Will be independent with HEP to increase B hip strength 1/2 muscle grade to improve walking/ stair climbing.   Baseline:  see above Goal status: On-going  2.  Pt. Able to complete 30 minutes of exercise with no increase c/o back pain or radicular symptoms to improve pain-free mobility.  Baseline:  5/10 back pain at rest and increase pain/ radicular symptoms with activity Goal status: INITIAL  LONG TERM GOALS: Target date: 12/15/24  Pt. Will decrease MODI to <50% to improve self-perceived pain-free mobility. Baseline: 68% self-perceived severe disability Goal status: INITIAL  2.  Pt. Will complete 5xSTS and decrease time by 3 seconds to improve pain-free mobility.  Baseline: 26.6 sec  Goal status: INITIAL  3.  Pt. Able to walk 5 blocks from house to regular meetings with no increase c/o back pain/ improved endurance.   Baseline: limited walking distance due to moderate back pain.   Goal status: INITIAL  4.  Pt. Will join/ participate with exercise program at [facility] with no increase c/o back pain.   Baseline: currently not exercising Goal status: INITIAL  PLAN:  PT FREQUENCY: 2x/week  PT DURATION: 6 weeks  PLANNED INTERVENTIONS: 97110-Therapeutic exercises, 97530- Therapeutic activity, [id number]- Neuromuscular re-education, 97535- Self Care, [id number]- Manual therapy, [phone]- Gait training, [phone]- Electrical stimulation (unattended), and Patient/Family  education.  PLAN FOR NEXT SESSION: Update STG.  ISSUE new HEP with gym focus  Ozell JAYSON Sero, PT, DPT # [phone] 11/24/2024, 11:58 AM  "

## 2024-11-26 ENCOUNTER — Ambulatory Visit: Admitting: Physical Therapy

## 2024-11-28 NOTE — Progress Notes (Unsigned)
 "  Referring Physician:  Theophilus Pagan, MD [address] [place],  [place] [address]  Primary Physician:  Theophilus Pagan, MD  History of Present Illness: [name] has a history of HTN, gastric ulcer, osteoporosis, compression fracture T12, prostate CA, gout, B12 deficiency, asthma, ETOH abuse, history of chronic steroid use.   He has been sober from ETOH for almost 2 years!  He only has 1 kidney.   Did video visit with me 08/22/24 to review his cervical MRI. He has mild central stenosis C3-C7 with mild right foraminal stenosis C5-C6 and mild left foraminal stenosis C6-C7. No significant cord compression.   He has mild central and bilateral foraminal stenosis T11-T12, mild left and moderate/severe right foraminal stenosis T8-T9, mild bilateral foraminal stenosis T10-T11, and chronic compression fractures at T10-T12.   He has known lumbar spondylosis with mild bilateral lateral recess stenosis L2-L3.   He was sent to PT for his cervical/thoracic/lumbar spine at last visit. He was to follow up with ENT (per Dr. Leigh regarding his balance/dexterity issues. He was sent to pain management to consider injections.   He did initial PT evaluation on 11/03/24 and has done 4 more visits through 11/28/24.   He had left T10-T11 IL ESI on 10/08/24 with improvement for about a week. He had great relief immediately, but it only lasted 3-4 days.   He is here for follow up.   His primary complaint continues to be constant mid to lower thoracic pain with some radiation into his ribs on left. This is worse with prolonged standing and walking. He can't cook a meal without stopping due to pain. Some relief with laying down.   His neck and lower back pain is still present, but has improved with PT. This pain is more tolerable.   He still has constant neck pain with constant numbness, tingling, and weakness in both arms. He has intermittent bilateral arm pain mostly to elbow, but sometimes to his  wrist. Pain is worse with lifting and using his arms.    He also has intermittent LBP with intermittent pain in bilateral anterior thigh pain and lateral hips. Occasional pain past his knees. He continues with numbness, tingling, and weakness. Pain is worse with prolonged standing/sitting/walking. Some relief with laying down.   He continues with balance/dexterity issues and an unsteady gait. He saw ENT- offered hearing aids for tinnitus.   Tobacco use:  Does not smoke. He does nicotine  pouches- he does 4 a day.   Bowel/Bladder Dysfunction: had prostate removed last July- he's had some urinary leaking and urgency since that time. No issues with his bowels.   Conservative measures:  Physical therapy: initial PT evaluation on 11/03/24 and has done 4 more visits through 11/28/24.  Multimodal medical therapy including regular antiinflammatories: tylenol , flexeril  Injections:  Left T10-T11 IL ESI 09/28/24 no epidural steroid injections for his neck, he has had some for his back in the early 2000s and in 2021.    Past Surgery: no spinal surgeries  Frank Terry Alert has dexterity issues. He thinks his balance is getting worse.   The symptoms are causing a significant impact on the patient's life.   Review of Systems:  A 10 point review of systems is negative, except for the pertinent positives and negatives detailed in the HPI.  Past Medical History: Past Medical History:  Diagnosis Date   Alcohol use disorder    pt currently lives at the [facility]   Allergy    Anxiety  Arthritis    Asthma    well controlled   Compression fracture of spine (HCC)    Congenital absence of one kidney    pt has right kidney   Depression    Gastric ulcer    GERD (gastroesophageal reflux disease)    no meds   Gout    HTN (hypertension)    Hyponatremia    Osteoporosis    Prostate cancer (HCC)    Ulcer    Vasitis 05/2023    Past Surgical History: Past Surgical History:  Procedure Laterality Date    COLONOSCOPY WITH ESOPHAGOGASTRODUODENOSCOPY (EGD)     EXCISION MASS HEAD Left 10/29/2024   Procedure: Facial mass excision;  Surgeon: Anice Riis, DO;  Location: [facility];  Service: ENT;  Laterality: Left;   MASS EXCISION Left 09/19/2023   Procedure: EXCISION MASS, deep soft tissue mass back;  Surgeon: Lane Shope, MD;  Location: [facility];  Service: General;  Laterality: Left;   PELVIC LYMPH NODE DISSECTION Bilateral 06/18/2023   Procedure: PELVIC LYMPH NODE DISSECTION;  Surgeon: Riis Knee, MD;  Location: [facility];  Service: Urology;  Laterality: Bilateral;   PROSTATE BIOPSY     ROBOT ASSISTED LAPAROSCOPIC RADICAL PROSTATECTOMY N/A 06/18/2023   Procedure: XI ROBOTIC ASSISTED LAPAROSCOPIC RADICAL PROSTATECTOMY;  Surgeon: Riis Knee, MD;  Location: [facility];  Service: Urology;  Laterality: N/A;   TONSILLECTOMY     age 59    Allergies: Allergies as of 12/01/2024 - Review Complete 12/01/2024  Allergen Reaction Noted   Penicillins Rash 05/07/2022    Medications: Outpatient Encounter Medications as of 12/01/2024  Medication Sig   acetaminophen  (TYLENOL ) 500 MG tablet Take 1,000 mg by mouth every 6 (six) hours as needed.   albuterol  (VENTOLIN  HFA) 108 (90 Base) MCG/ACT inhaler Inhale 2 puffs into the lungs every 6 (six) hours as needed for wheezing or shortness of breath.   cetirizine (ZYRTEC) 10 MG tablet Take 10 mg by mouth daily. Unsure of dose   colchicine  0.6 MG tablet Take 0.6 mg by mouth as needed.   irbesartan  (AVAPRO ) 150 MG tablet Take 1 tablet (150 mg total) by mouth daily.   nortriptyline  (PAMELOR ) 25 MG capsule Take 2 capsules (50 mg total) by mouth at bedtime.   omeprazole (PRILOSEC OTC) 20 MG tablet Take 20 mg by mouth daily.   rizatriptan  (MAXALT ) 10 MG tablet Take 1 tablet (10 mg total) by mouth as needed for migraine. May repeat in 2 hours if needed   [DISCONTINUED] fluticasone -salmeterol (ADVAIR HFA) 230-21 MCG/ACT inhaler Inhale 2 puffs  into the lungs 2 (two) times daily.   No facility-administered encounter medications on file as of 12/01/2024.    Social History: Social History   Tobacco Use   Smoking status: Former    Types: Cigarettes    Start date: 1984   Smokeless tobacco: Current   Tobacco comments:    Nicotine  patches   Vaping Use   Vaping status: Never Used  Substance Use Topics   Alcohol use: Not Currently    Alcohol/week: 168.0 standard drinks of alcohol    Types: 168 Cans of beer per week    Comment: pt lives in [facility] for alcohol addiction   Drug use: Not Currently    Family Medical History: Family History  Problem Relation Age of Onset   Hypertension Mother    Lung cancer Mother    Osteoporosis Mother    Alcohol abuse Mother    Arthritis Mother    Cancer  Mother    Heart disease Mother    Hypertension Father    Cancer Father        Abdominal   Heart attack Father    Hypertension Sister    Multiple sclerosis Sister     Physical Examination: Vitals:   12/01/24 1139  BP: 134/84    Awake, alert, oriented to person, place, and time.  Speech is clear and fluent. Fund of knowledge is appropriate.   Cranial Nerves: Pupils equal round and reactive to light.  Facial tone is symmetric.    No abnormal lesions on exposed skin.   Strength: Side Biceps Triceps Deltoid Interossei Grip Wrist Ext. Wrist Flex.  R 5 5 5 5 5 5 5   L 5 5 5 5 5 5 5    Side Iliopsoas Quads Hamstring PF DF EHL  R 5 5 5 5 5 5   L 5 5 5 5 5 5    Reflexes are 2+ and symmetric at the biceps, brachioradialis, and achilles, absent at right achilles. 3+ at the patella.      Hoffman's is absent.  Clonus is not present.    Bilateral upper and lower extremity sensation is intact to light touch.     No pain with IR/ER of both hips.   He has slow gait.   Medical Decision Making  Imaging: None   Assessment and Plan: Mr. Gentile's primary complaint continues to be constant mid to lower thoracic pain with  some radiation into his ribs on left. He can't cook a meal without stopping due to pain.   He has mild central and bilateral foraminal stenosis T11-T12, mild left and moderate/severe right foraminal stenosis T8-T9, mild bilateral foraminal stenosis T10-T11, and chronic compression fractures at T10-T12.    His neck and lower back pain is still present, but has improved with PT. This pain is more tolerable.   He still has constant neck pain with constant numbness, tingling, and weakness in both arms. He has intermittent bilateral arm pain mostly to elbow, but sometimes to his wrist.   He has mild central stenosis C3-C7 with mild right foraminal stenosis C5-C6 and mild left foraminal stenosis C6-C7. No significant cord compression.    He also has intermittent LBP with intermittent pain in bilateral anterior thigh pain and lateral hips. Occasional pain past his knees.    He has known lumbar spondylosis with mild bilateral lateral recess stenosis L2-L3.    Treatment options discussed with patient and following plan made:   - Keep follow up with Dr. Lateef as he may be candidate for repeat injections.  - Will review thoracic imaging with Dr. Claudene to see if he is candidate for any surgery, may be candidate for SCS?  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  - Continue with PT for cervical and lumbar spine.  - Follow up with Dr. Leigh as scheduled (neurology).  - Will message him after I hear back from Dr. Claudene and will determine follow up at that time.    I spent a total of 25 minutes in face-to-face and non-face-to-face activities related to this patient's care today including review of outside records, review of imaging, review of symptoms, physical exam, discussion of differential diagnosis, discussion of treatment options, and documentation.   ADDENDUM 12/02/24:  Patient discussed with Dr. Claudene. May consider thoracic facet injections to see if he is getting referred pain from previous  fracture. Will send message to Dr. Marcelino. He has follow up with him on  12/09/24.   Would also consider getting standing thoracic flex/ext xrays to see if he has any instability at fracture line.   Message sent to patient.   Glade Boys PA-C Dept. of Neurosurgery  "

## 2024-12-01 ENCOUNTER — Ambulatory Visit: Admitting: Physical Therapy

## 2024-12-01 ENCOUNTER — Ambulatory Visit: Admitting: Orthopedic Surgery

## 2024-12-01 ENCOUNTER — Encounter: Payer: Self-pay | Admitting: Orthopedic Surgery

## 2024-12-01 VITALS — BP 134/84 | Ht 69.0 in | Wt 218.0 lb

## 2024-12-01 DIAGNOSIS — M542 Cervicalgia: Secondary | ICD-10-CM

## 2024-12-01 DIAGNOSIS — M6281 Muscle weakness (generalized): Secondary | ICD-10-CM

## 2024-12-01 DIAGNOSIS — M5459 Other low back pain: Secondary | ICD-10-CM

## 2024-12-01 DIAGNOSIS — M47814 Spondylosis without myelopathy or radiculopathy, thoracic region: Secondary | ICD-10-CM

## 2024-12-01 DIAGNOSIS — R5381 Other malaise: Secondary | ICD-10-CM

## 2024-12-01 DIAGNOSIS — M4726 Other spondylosis with radiculopathy, lumbar region: Secondary | ICD-10-CM

## 2024-12-01 DIAGNOSIS — Z8781 Personal history of (healed) traumatic fracture: Secondary | ICD-10-CM

## 2024-12-01 DIAGNOSIS — M5412 Radiculopathy, cervical region: Secondary | ICD-10-CM

## 2024-12-01 DIAGNOSIS — G8929 Other chronic pain: Secondary | ICD-10-CM

## 2024-12-01 DIAGNOSIS — M47816 Spondylosis without myelopathy or radiculopathy, lumbar region: Secondary | ICD-10-CM

## 2024-12-01 DIAGNOSIS — M5416 Radiculopathy, lumbar region: Secondary | ICD-10-CM

## 2024-12-01 DIAGNOSIS — M4722 Other spondylosis with radiculopathy, cervical region: Secondary | ICD-10-CM

## 2024-12-01 DIAGNOSIS — M47812 Spondylosis without myelopathy or radiculopathy, cervical region: Secondary | ICD-10-CM

## 2024-12-01 DIAGNOSIS — M2560 Stiffness of unspecified joint, not elsewhere classified: Secondary | ICD-10-CM

## 2024-12-01 NOTE — Therapy (Signed)
 " OUTPATIENT PHYSICAL THERAPY THORACOLUMBAR TREATMENT  Patient Name: Frank Terry MRN: [id number] DOB:1966-03-25, 59 y.o., male Today's Date: 12/01/2024  END OF SESSION:  PT End of Session - 12/01/24 0811     Visit Number 6    Number of Visits 12    Date for Recertification  12/15/24    PT Start Time 0811    PT Stop Time 0918    PT Time Calculation (min) 67 min    Activity Tolerance Patient tolerated treatment well;No increased pain    Behavior During Therapy WFL for tasks assessed/performed          Past Medical History:  Diagnosis Date   Alcohol use disorder    pt currently lives at the [facility]   Allergy    Anxiety    Arthritis    Asthma    well controlled   Compression fracture of spine (HCC)    Congenital absence of one kidney    pt has right kidney   Depression    Gastric ulcer    GERD (gastroesophageal reflux disease)    no meds   Gout    HTN (hypertension)    Hyponatremia    Osteoporosis    Prostate cancer (HCC)    Ulcer    Vasitis 05/2023   Past Surgical History:  Procedure Laterality Date   COLONOSCOPY WITH ESOPHAGOGASTRODUODENOSCOPY (EGD)     EXCISION MASS HEAD Left 10/29/2024   Procedure: Facial mass excision;  Surgeon: Frank Riis, DO;  Location: [facility];  Service: ENT;  Laterality: Left;   MASS EXCISION Left 09/19/2023   Procedure: EXCISION MASS, deep soft tissue mass back;  Surgeon: Frank Shope, MD;  Location: [facility];  Service: General;  Laterality: Left;   PELVIC LYMPH NODE DISSECTION Bilateral 06/18/2023   Procedure: PELVIC LYMPH NODE DISSECTION;  Surgeon: Terry Knee, MD;  Location: [facility];  Service: Urology;  Laterality: Bilateral;   PROSTATE BIOPSY     ROBOT ASSISTED LAPAROSCOPIC RADICAL PROSTATECTOMY N/A 06/18/2023   Procedure: XI ROBOTIC ASSISTED LAPAROSCOPIC RADICAL PROSTATECTOMY;  Surgeon: Terry Knee, MD;  Location: [facility];  Service: Urology;  Laterality: N/A;   TONSILLECTOMY     age 59    Patient Active Problem List   Diagnosis Date Noted   Radicular pain of thoracic region 11/03/2024   Foraminal stenosis of thoracic region 11/03/2024   Thoracic facet joint syndrome 11/03/2024   History of substance abuse (HCC) 11/03/2024   Chronic pain syndrome 11/03/2024   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   New daily persistent headache 09/25/2023   Soft tissue mass 09/11/2023   Nodule of skin of back 08/16/2023   Nodule of skin of head 08/16/2023   Blurry vision 08/16/2023   Allergic rhinitis 08/10/2023   Osteoporosis 08/10/2023   Skin lesion 08/10/2023   Fatigue 04/06/2023   Terry pain, bilateral 03/19/2023   Compression fracture of spine (HCC) 03/19/2023   Anxious mood 03/19/2023   B12 deficiency 03/19/2023   Hypertension 03/03/2023   Gastric ulcer 03/03/2023   Hyponatremia 03/02/2023   Asthma, chronic 03/02/2023   Gout 03/02/2023   Prostate cancer (HCC) 03/02/2023   Spondylosis of lumbar region without myelopathy or radiculopathy 03/05/2020   Diverticulosis of intestine with bleeding 01/28/2016   Congenital absence of left kidney 01/28/2016   PCP: Frank Pagan, MD  REFERRING PROVIDER: Hilma Hastings, PA-C  REFERRING DIAG:  Diagnosis  M47.816 (ICD-10-CM) - Lumbar spondylosis  M54.16 (ICD-10-CM) - Lumbar radiculopathy  M54.12 (ICD-10-CM) - Cervical radiculopathy  F52.187 (ICD-10-CM) - Cervical spondylosis  M47.814 (ICD-10-CM) - Thoracic spondylosis   Rationale for Evaluation and Treatment: Rehabilitation  THERAPY DIAG:  Other low back pain  Neck pain  Joint stiffness of spine  Muscle weakness (generalized)  Physical deconditioning  Chronic left-sided thoracic back pain  ONSET DATE: chronic  SUBJECTIVE:                                                                                                                                                                                           SUBJECTIVE STATEMENT: Pt. Referred to PT with chronic h/o  neck/thoracic/lumbar spine pain.  Pt. Currently on disability since 2020 secondary to back pain.  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  Pt. Has been living at [facility] in [place] for past 1.5 years of sober living.  Pt. Has 4 stairs to enter/exit house.  Pt. Received an injection in back from Dr. Marcelino but pain returned in <1 week.  Pt. Currently ambulates with SPC and limited with walking distance.  Pt. Was able to walk 5 blocks in [place] in past but currently limited by pain/ deconditioning.  Pt. Not driving and hoping to join [employer] with [employer] membership.  Pt. Has received PT in past with Frank Terry at [facility] s/p prostate surgery.    PERTINENT HISTORY:  History of present illness (HPI) [name], a 59 y.o. year old male, is here today because of his Radicular pain of thoracic region [M54.14]. Frank Terry's primary complain today is Back Pain   Pertinent problems: Frank Terry has Asthma, chronic; Prostate cancer (HCC); Gastric ulcer; Terry pain, bilateral; Compression fracture of spine (HCC); Spondylosis of lumbar region without myelopathy or radiculopathy; Congenital absence of left kidney; Radicular pain of thoracic region; Foraminal stenosis of thoracic region; Thoracic facet joint syndrome; History of substance abuse (HCC); and Chronic pain syndrome on their pertinent problem list.   Pain Assessment: Severity of Chronic pain is reported as a 4 /10. Location: Back Left, Mid, Lower/Radiates into abdomen. Onset: More than a month ago. Quality: Sharp, Aching, Constant. Timing: Constant. Modifying factor(s): Denies. Vitals:  height is 5' 9 (1.753 m) and weight is 215 lb (97.5 kg). His temporal temperature is 97.2 F (36.2 C) (abnormal). His blood pressure is 145/87 (abnormal) and his pulse is 109 (abnormal). His respiration is 20 and oxygen saturation is 100%.  BMI: Estimated body mass index is 31.75 kg/m as calculated from the following:   Height as of this  encounter: 5' 9 (1.753 m).   Weight as of this encounter: 215 lb (97.5 kg).   Last encounter:  Visit date not found. Last procedure: Visit date not found.   Reason for encounter: post-procedure evaluation and assessment.    Discussed the use of AI scribe software for clinical note transcription with the patient, who gave verbal consent to proceed.   History of Present Illness   He is a 59 year old male who presents with persistent thoracic pain following a thoracic epidural steroid injection.   He underwent a thoracic epidural steroid injection on October 08, 2024. Initially, he experienced complete pain relief immediately after the procedure, but the pain returned to a level of six out of ten within four to six hours. He reported approximately 50% pain relief for the first week post-procedure, but no relief thereafter.   Prior to the procedure, his pain level was consistently around six out of ten, worsening throughout the day from a two or three in the morning to a six by noon. The pain is described as a shooting pain across the abdomen, radiating from the back around to the front. Previously, lying down provided relief, but now it does not, and he has experienced several sleepless nights due to discomfort.   He takes Advil  and Tylenol , typically once or twice a day, to manage the pain. He has also used ice and heat therapy post-procedure, applying ice for six to eight hours on the day of the procedure and heat the following day.   He denied engaging in any work or activities that could have caused injury. Bending or stretching does not significantly increase his pain. However, activities such as standing or sitting upright for extended periods, or bending slightly while performing tasks, exacerbate his symptoms.   He has no history of diabetes, and his glucose levels have been normal in the past. He recalls having his sugar levels checked by his primary care doctor, but cannot remember the  exact timing of the last test.     Procedure Type:  Inter-Laminar Thoracic Epidural Steroid Block/Injection  #1  Laterality: Left Level: T10-11  DOS: 10/08/2024 Performed by: Wallie Sherry, MD Imaging: Fluoroscopic guidance Anesthesia: Local anesthesia (1-2% Lidocaine ) Purpose: Diagnostic/Therapeutic Indications: Thoracic back pain, radicular pain, with degenerative disc disease severe enough to impact quality of life or function. 1. Radicular pain of thoracic region   2. Foraminal stenosis of thoracic region   3. Compression fracture of thoracic vertebra, unspecified thoracic vertebral level, sequela     NAS-11 Pain score:        Pre-procedure: 4 /10        Post-procedure: 4 /10    Post-Procedure Evaluation    Effectiveness:  Initial hour after procedure: 100 % . Subsequent 4-6 hours post-procedure: 100 % . Analgesia past initial 6 hours: 50 % . Ongoing improvement:  Analgesic:  50% (approximately 7 days, and then pain return to baseline) Function: Somewhat improved ROM: Somewhat improved Interpretation: Mr. Irvine underwent a diagnostic/therapeutic Inter-Laminar Thoracic Epidural Steroid Block/Injection on October 08, 2024.  He reports initially 100% pain relief and functional improvement during local anesthetic phase, followed by sustained ongoing 50% pain relief and functional improvement approximately 7 days and then pain returned to baseline.   PAIN:  Are you having pain? Yes: NPRS scale: 5/10 Pain location: cervical/thoracic/lumbar spine Pain description: sharp in L thoracic spine/ribs/ radiating pain Aggravating factors: prolonged standing/ increase movement Relieving factors: short-term relief with injection  PRECAUTIONS: None  RED FLAGS: None   WEIGHT BEARING RESTRICTIONS: No  FALLS:  Has patient fallen in last 6 months? No  LIVING ENVIRONMENT:  Lives with: lives in an adult home Lives in: House/apartment Stairs: Yes: External: 4 steps; none Has following  equipment at home: Single point cane  OCCUPATION: On disability  PLOF: Independent  PATIENT GOALS: Decrease neck/back pain and increase overall conditioning to improve walking endurance/ functional mobility.    NEXT MD VISIT: 11/05/24  OBJECTIVE:  Note: Objective measures were completed at Evaluation unless otherwise noted.  DIAGNOSTIC FINDINGS:  EXAM: MRI CERVICAL SPINE WITHOUT CONTRAST 08/12/2024 07:42:00 AM   TECHNIQUE: Multiplanar multisequence MRI of the cervical spine was performed.   COMPARISON: CT of the cervical spine dated 07/30/2024.   CLINICAL HISTORY: Abnormal MRI. Weakness in limbs with pain in neck. HA's. Numb/ting in all extremities for 6 months.   FINDINGS:   BONES AND ALIGNMENT: Normal alignment. Normal vertebral body heights. Bone marrow signal is unremarkable.   SPINAL CORD: Normal spinal cord size. No abnormal spinal cord signal. No apparent impingement of the spinal cord.   SOFT TISSUES: No paraspinal mass.   C2-C3: No significant disc herniation. No spinal canal stenosis or neural foraminal narrowing.   C3-C4: Minimal disc bulging and mild bilateral uncovertebral joint hypertrophy with mild central spinal canal stenosis. The neural foramina are patent.   C4-C5: No significant disc herniation. No spinal canal stenosis or neural foraminal narrowing.   C5-C6: Broad-based disc bulge and endplate ridging with mild central spinal canal stenosis and mild right neural foraminal stenosis.   C6-C7: Diffuse disc bulging and left-sided uncovertebral joint hypertrophy, with mild left spinal canal and left neural foraminal stenosis.   C7-T1: No significant disc herniation. No spinal canal stenosis or neural foraminal narrowing.   IMPRESSION: 1. Mild central spinal canal stenosis at C3-4, C5-6, and left C6-7. 2. Mild right neural foraminal stenosis at C5-6 and mild left neural foraminal stenosis at C6-7. 3. No apparent impingement of the  spinal cord or nerve roots.   Electronically signed by: Evalene Coho MD 08/12/2024 08:16 AM  EXAM: MRI THORACIC SPINE WITHOUT CONTRAST   TECHNIQUE: Multiplanar, multisequence MR imaging of the thoracic spine was performed. No intravenous contrast was administered.   COMPARISON:  CT cervical spine 07/30/2024. CTA chest 03/20/2024. CT Abdomen and Pelvis 07/14/2023.   FINDINGS: Limited cervical spine imaging: Largely negative for age, concordant with cervical spine CT earlier this month.   Thoracic spine segmentation:  Normal on previous CTs.   Alignment: Chronic exaggerated lower thoracic kyphosis in conjunction with chronic lower thoracic compression fractures, stable from CT last year. No significant thoracic scoliosis or spondylolisthesis.   Vertebrae: Chronic superior endplate compression fractures of T10, T11 and T12 are stable since last year. No associated marrow edema. Normal background bone marrow signal. Small chronic sclerotic focus superimposed in the right T10 vertebral body also stable by CT since last year and compatible with benign etiology, probably bone island. Stable, maintained thoracic vertebral height elsewhere. No marrow edema or evidence of acute osseous abnormality.   Cord: Normal. Capacious thoracic spinal canal at most levels, see details below. Conus medullaris mostly visible at T12-L1 and appears to be normal.   Paraspinal and other soft tissues: Stable, negative.   Disc levels:   No age advanced thoracic spine degeneration   T1-T2 through T7-T8.   T8-T9: Circumferential disc bulge with mild to moderate facet and ligament flavum hypertrophy. No spinal stenosis. Mild left and moderate to severe right T8 neural foraminal stenosis.   T9-T10: Negative disc. Moderate to severe facet hypertrophy. Mild left and moderate to severe right T9 foraminal stenosis.  T10-T11: Minor disc bulging. Moderate facet and ligament flavum hypertrophy. Mild  T10 foraminal stenosis.   T11-T12: Disc osteophyte complex in part related to chronic retropulsion of the posterosuperior T12 endplate, stable from last year. Moderate posterior element hypertrophy. Borderline to mild spinal stenosis. No cord mass effect. Mild bilateral T11 foraminal stenosis.   IMPRESSION: 1. No acute or suspicious osseous abnormality in the thoracic spine. Chronic T10, T11, and T12 compression fractures are stable from last year. 2. Mild for age thoracic spine degeneration above T8-T9. Mild chronic T12 retropulsion contributes to borderline or mild spinal stenosis at T11-T12. Lower thoracic facet arthropathy results in moderate to severe neural foraminal stenosis at the right T8 and T9 nerve levels.   Electronically Signed   By: VEAR Hurst M.D.   On: 08/08/2024 10:57  MRI LUMBAR SPINE 07/01/2024 02:37:10 PM   TECHNIQUE: Multiplanar multisequence MRI of the lumbar spine was performed without the administration of intravenous contrast.   COMPARISON: MRI of the lumbar spine dated 04/04/2023.   CLINICAL HISTORY: Low back pain, cauda equina syndrome suspected. Pt reports lower back pain that radiates to his abdomen and is now causing him to leak urine. Pt has prostate hx and chronic back pain but the incontinence is new.   FINDINGS:   BONES AND ALIGNMENT: Moderate chronic compression deformity of T12, unchanged from prior study. The vertebral body has lost about 50% of its height anteriorly and there is a chronic schmorl's node within the superior endplate. There is chronic downward bowing of the superior endplates of L1 and L2, as before.   SPINAL CORD: The conus medullaris terminates at L1.   SOFT TISSUES: No paraspinal mass.   L1-L2: No significant disc herniation. No spinal canal stenosis or neural foraminal narrowing.   L2-L3: Mild diffuse disc bulging with mild bilateral lateral recess stenosis.   L3-L4: Slight disc bulging without  significant spinal canal or neural foraminal stenosis.   L4-L5: No significant disc herniation. No spinal canal stenosis or neural foraminal narrowing.   L5-S1: Slight disc bulging without significant spinal canal or neural foraminal stenosis.   OTHER FINDINGS: The left kidney is not visualized.   IMPRESSION: 1. No evidence of cauda equina syndrome. 2. Moderate chronic compression deformity of T12, unchanged. 3. Chronic downward bowing of the superior endplates of L1 and L2, as before. 4. Mild diffuse disc bulging at L2-3 with mild bilateral lateral recess stenosis. 5. Slight disc bulging at L3-4 and L5-S1 without significant spinal canal or neural foraminal stenosis.  PATIENT SURVEYS:  MODI: 68% self-perceived severe disability  COGNITION: Overall cognitive status: Within functional limits for tasks assessed     SENSATION: Light touch: Impaired   MUSCLE LENGTH: Hamstrings: Right 48 deg; Left 33 deg Thomas test: NT  POSTURE: rounded shoulders and forward head  PALPATION: Moderate tenderness in B lumbar paraspinals/ SI region.  Pt. Reports B LE radicular symptoms with prolonged standing.    LUMBAR ROM:  Flexion: limited 25%, extension: limited 50%, L/R rotn. Limited 25% and L/R lateral flexion WFL (L sided rib pain).  5/10 L thoracic/ rib pain.  Mid-thoracic/ lumbar hypomobility with assessment of central/unilateral mobs.    CERVICAL ROM:      Flexion: 41 deg./ extension 35 deg./ L rotn. 44 deg./ R rotn. 48 deg./ R lat. Flexion 40 deg./ L lateral flexion.   B shoulder AROM WFL   LOWER EXTREMITY MMT:    MMT Right eval Left eval  Hip flexion 4+ 4+  Hip extension  Hip abduction 4+ 4+  Hip adduction    Hip internal rotation 5 5  Hip external rotation 5 5  Terry flexion 5 5  Terry extension 5 5  Ankle dorsiflexion    Ankle plantarflexion    Ankle inversion    Ankle eversion     (Blank rows = not tested)  LUMBAR SPECIAL TESTS:  (+) SLR on L.  FUNCTIONAL  TESTS:  5xSTS: 26.6 sec  GAIT: Distance walked: in clinic Assistive device utilized: Single point cane Level of assistance: Modified independence Comments: antalgic gait with increase c/o back pain with distance walked.    5xSTS completed:  -26.6 sec  TREATMENT DATE: 12/01/2024        SUBJECTIVE STATEMENT:  Pt reports 2/10 pain in the lower back and referral to abdominal region. No LE radicular symptoms reported today. Pt says exercises have been going well and he has been able to get out to [employer] 5x this past week on non PT days. Began keto diet 11/20/24  Therapeutic Exercise: - for improved soft tissue flexibility and extensibility as needed for ROM, improved strength as needed to improve performance of CKC activities/functional movements  NuStep L5 for 10 min. B UE/LE to improve soft tissue mobility and increased tissue temperature to improve muscle performance.     Supine marching bridges 20x each leg.   5# UE dead bug 2x20  Nautilus: 40# standing scap. Retraction 50# 2x10   Pallof press at Nautilus 40# 15x on L/R with cuing for proper technique.   Nautilus: resisted walking L/R side steps 80# to blue line 2x facing each way  Hooklying, lower trunk rotations; 1 x 10 alt R/L  Manual therapy:  for pain modulation  Supine LE/lumbar stretches (generalized)- focus on hamstring/ piriformis/ rotn. Stretches.    Prone STM to thoracic/lumbar paraspinals.  Grade III PA unilateral/central mobs from mid-thoracic to lumbar spine.  Significant hypomobility noted in mid-back.    STM and IASTM with [employer] along T7-T12 thoracic paraspinals, bilateral lumbar erector spinae in sitting; x 12 minutes   PATIENT EDUCATION:  Education details: Spine hypomobility/ functional goals/ Artist Person educated: Patient Education method: Explanation Education comprehension: verbalized understanding  HOME EXERCISE PROGRAM: Access Code: [email or web address] URL:  [email or web address] Date: 11/06/2024 Prepared by: Venetia Endo  Exercises - Static Prone on Elbows  - 3-4 x daily - 7 x weekly - 1 sets - 3-4 min hold - Hooklying Lumbar Traction  - 7 x weekly - 10 reps - 5-10sec hold - Supine Lower Trunk Rotation  - 2 x daily - 7 x weekly - 2 sets - 10 reps - Supine Bridge  - 2 x daily - 7 x weekly - 2 sets - 10 reps - Seated Scapular Retraction  - 2 x daily - 7 x weekly - 2 sets - 10 reps - 3sec hold  Access Code: [email or web address] URL: [email or web address] Date: 11/19/2024 Prepared by: Ozell Sero  Exercises - Static Prone on Elbows  - 3-4 x daily - 7 x weekly - 1 sets - 3-4 min hold - Hooklying Lumbar Traction  - 7 x weekly - 10 reps - 5-10sec hold - Supine Lower Trunk Rotation  - 2 x daily - 7 x weekly - 2 sets - 10 reps - Supine Bridge  - 2 x daily - 7 x weekly - 2 sets - 10 reps - Scapular Retraction with Resistance  - 1 x daily - 4 x weekly - 2 sets - 15 reps - Shoulder W -  External Rotation with Resistance  - 1 x daily - 4 x weekly - 2 sets - 15 reps   ASSESSMENT:  CLINICAL IMPRESSION:   Pt reports minimal back pain symptoms this morning but continues to c/o referral pain on L low back to L abdominal region.  Pt. Demonstrates good understanding and technique of gym based exercises such pallof press and resisted scapular retraction and no increase c/o pain with marching bridges. No increase of pain after going to [employer] 5x this past week for mostly conditioning and some basic strengthening exercises.  Patient will benefit from continued skilled PT services to increase spinal mobility/ generalized strengthening and conditioning to progress to [employer] ex. Program/ walking program.   OBJECTIVE IMPAIRMENTS: Abnormal gait, decreased activity tolerance, decreased balance, decreased coordination, decreased endurance, decreased mobility, difficulty walking, decreased ROM, decreased strength, hypomobility,  impaired flexibility, improper body mechanics, postural dysfunction, and pain.   ACTIVITY LIMITATIONS: carrying, lifting, bending, standing, squatting, and locomotion level  PARTICIPATION LIMITATIONS: cleaning and community activity  PERSONAL FACTORS: Fitness and Past/current experiences are also affecting patient's functional outcome.   REHAB POTENTIAL: Good  CLINICAL DECISION MAKING: Evolving/moderate complexity  EVALUATION COMPLEXITY: Moderate   GOALS: Goals reviewed with patient? Yes  SHORT TERM GOALS: Target date: 11/24/24  Pt. Will be independent with HEP to increase B hip strength 1/2 muscle grade to improve walking/ stair climbing.   Baseline:  see above Goal status: Partially met  2.  Pt. Able to complete 30 minutes of exercise with no increase c/o back pain or radicular symptoms to improve pain-free mobility.  Baseline:  5/10 back pain at rest and increase pain/ radicular symptoms with activity Goal status: Partially met  LONG TERM GOALS: Target date: 12/15/24  Pt. Will decrease MODI to <50% to improve self-perceived pain-free mobility. Baseline: 68% self-perceived severe disability Goal status: INITIAL  2.  Pt. Will complete 5xSTS and decrease time by 3 seconds to improve pain-free mobility.  Baseline: 26.6 sec  Goal status: INITIAL  3.  Pt. Able to walk 5 blocks from house to regular meetings with no increase c/o back pain/ improved endurance.   Baseline: limited walking distance due to moderate back pain.   Goal status: INITIAL  4.  Pt. Will join/ participate with [facility] program [facility] with no increase c/o back pain.   Baseline: currently not exercising Goal status: INITIAL  PLAN:  PT FREQUENCY: 2x/week  PT DURATION: 6 weeks  PLANNED INTERVENTIONS: 97110-Therapeutic exercises, 97530- Therapeutic activity, [id number]- Neuromuscular re-education, 97535- Self Care, [id number]- Manual therapy, [phone]- Gait training, [phone]- Electrical stimulation  (unattended), and Patient/Family education.  PLAN FOR NEXT SESSION: ISSUE new HEP with [employer]   Ozell JAYSON Sero, PT, DPT # [id number] [place] Gainer, SPT 12/01/2024, 12:55 PM  "

## 2024-12-02 DIAGNOSIS — M47816 Spondylosis without myelopathy or radiculopathy, lumbar region: Secondary | ICD-10-CM

## 2024-12-02 DIAGNOSIS — Z8781 Personal history of (healed) traumatic fracture: Secondary | ICD-10-CM

## 2024-12-03 ENCOUNTER — Ambulatory Visit: Admitting: Physical Therapy

## 2024-12-03 DIAGNOSIS — M6281 Muscle weakness (generalized): Secondary | ICD-10-CM

## 2024-12-03 DIAGNOSIS — M5459 Other low back pain: Secondary | ICD-10-CM | POA: Diagnosis not present

## 2024-12-03 DIAGNOSIS — R5381 Other malaise: Secondary | ICD-10-CM

## 2024-12-03 DIAGNOSIS — G8929 Other chronic pain: Secondary | ICD-10-CM

## 2024-12-03 DIAGNOSIS — M542 Cervicalgia: Secondary | ICD-10-CM

## 2024-12-03 DIAGNOSIS — M2560 Stiffness of unspecified joint, not elsewhere classified: Secondary | ICD-10-CM

## 2024-12-03 NOTE — Telephone Encounter (Signed)
 FYI Frank Terry

## 2024-12-03 NOTE — Therapy (Signed)
 " OUTPATIENT PHYSICAL THERAPY THORACOLUMBAR TREATMENT  Patient Name: Frank Terry MRN: [id number] DOB:04-17-1966, 59 y.o., male Today's Date: 12/04/2024  END OF SESSION:  PT End of Session - 12/03/24 0814     Visit Number 7    Number of Visits 12    Date for Recertification  12/15/24    PT Start Time 0816    PT Stop Time 0908    PT Time Calculation (min) 52 min    Activity Tolerance Patient tolerated treatment well;No increased pain    Behavior During Therapy WFL for tasks assessed/performed          Past Medical History:  Diagnosis Date   Alcohol use disorder    pt currently lives at the [facility]   Allergy    Anxiety    Arthritis    Asthma    well controlled   Compression fracture of spine (HCC)    Congenital absence of one kidney    pt has right kidney   Depression    Gastric ulcer    GERD (gastroesophageal reflux disease)    no meds   Gout    HTN (hypertension)    Hyponatremia    Osteoporosis    Prostate cancer (HCC)    Ulcer    Vasitis 05/2023   Past Surgical History:  Procedure Laterality Date   COLONOSCOPY WITH ESOPHAGOGASTRODUODENOSCOPY (EGD)     EXCISION MASS HEAD Left 10/29/2024   Procedure: Facial mass excision;  Surgeon: Anice Riis, DO;  Location: [facility];  Service: ENT;  Laterality: Left;   MASS EXCISION Left 09/19/2023   Procedure: EXCISION MASS, deep soft tissue mass back;  Surgeon: Lane Shope, MD;  Location: [facility];  Service: General;  Laterality: Left;   PELVIC LYMPH NODE DISSECTION Bilateral 06/18/2023   Procedure: PELVIC LYMPH NODE DISSECTION;  Surgeon: Riis Knee, MD;  Location: [facility];  Service: Urology;  Laterality: Bilateral;   PROSTATE BIOPSY     ROBOT ASSISTED LAPAROSCOPIC RADICAL PROSTATECTOMY N/A 06/18/2023   Procedure: XI ROBOTIC ASSISTED LAPAROSCOPIC RADICAL PROSTATECTOMY;  Surgeon: Riis Knee, MD;  Location: [facility];  Service: Urology;  Laterality: N/A;   TONSILLECTOMY     age 51    Patient Active Problem List   Diagnosis Date Noted   Radicular pain of thoracic region 11/03/2024   Foraminal stenosis of thoracic region 11/03/2024   Thoracic facet joint syndrome 11/03/2024   History of substance abuse (HCC) 11/03/2024   Chronic pain syndrome 11/03/2024   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   New daily persistent headache 09/25/2023   Soft tissue mass 09/11/2023   Nodule of skin of back 08/16/2023   Nodule of skin of head 08/16/2023   Blurry vision 08/16/2023   Allergic rhinitis 08/10/2023   Osteoporosis 08/10/2023   Skin lesion 08/10/2023   Fatigue 04/06/2023   Knee pain, bilateral 03/19/2023   Compression fracture of spine (HCC) 03/19/2023   Anxious mood 03/19/2023   B12 deficiency 03/19/2023   Hypertension 03/03/2023   Gastric ulcer 03/03/2023   Hyponatremia 03/02/2023   Asthma, chronic 03/02/2023   Gout 03/02/2023   Prostate cancer (HCC) 03/02/2023   Spondylosis of lumbar region without myelopathy or radiculopathy 03/05/2020   Diverticulosis of intestine with bleeding 01/28/2016   Congenital absence of left kidney 01/28/2016   PCP: Theophilus Pagan, MD  REFERRING PROVIDER: Hilma Hastings, PA-C  REFERRING DIAG:  Diagnosis  M47.816 (ICD-10-CM) - Lumbar spondylosis  M54.16 (ICD-10-CM) - Lumbar radiculopathy  M54.12 (ICD-10-CM) - Cervical radiculopathy  F52.187 (ICD-10-CM) - Cervical spondylosis  M47.814 (ICD-10-CM) - Thoracic spondylosis   Rationale for Evaluation and Treatment: Rehabilitation  THERAPY DIAG:  Other low back pain  Neck pain  Muscle weakness (generalized)  Joint stiffness of spine  Physical deconditioning  Chronic left-sided thoracic back pain  ONSET DATE: chronic  SUBJECTIVE:                                                                                                                                                                                           SUBJECTIVE STATEMENT: Pt. Referred to PT with chronic h/o  neck/thoracic/lumbar spine pain.  Pt. Currently on disability since 2020 secondary to back pain.  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  Pt. Has been living at [facility] in [place] for past 1.5 years of sober living.  Pt. Has 4 stairs to enter/exit house.  Pt. Received an injection in back from Dr. Marcelino but pain returned in <1 week.  Pt. Currently ambulates with SPC and limited with walking distance.  Pt. Was able to walk 5 blocks in [place] in past but currently limited by pain/ deconditioning.  Pt. Not driving and hoping to join [employer] with [employer] membership.  Pt. Has received PT in past with Lupe Plump at [facility] s/p prostate surgery.    PERTINENT HISTORY:  History of present illness (HPI) Frank Terry, a 59 y.o. year old male, is here today because of his Radicular pain of thoracic region [M54.14]. Mr. Miles's primary complain today is Back Pain   Pertinent problems: Mr. Bosserman has Asthma, chronic; Prostate cancer (HCC); Gastric ulcer; Knee pain, bilateral; Compression fracture of spine (HCC); Spondylosis of lumbar region without myelopathy or radiculopathy; Congenital absence of left kidney; Radicular pain of thoracic region; Foraminal stenosis of thoracic region; Thoracic facet joint syndrome; History of substance abuse (HCC); and Chronic pain syndrome on their pertinent problem list.   Pain Assessment: Severity of Chronic pain is reported as a 4 /10. Location: Back Left, Mid, Lower/Radiates into abdomen. Onset: More than a month ago. Quality: Sharp, Aching, Constant. Timing: Constant. Modifying factor(s): Denies. Vitals:  height is 5' 9 (1.753 m) and weight is 215 lb (97.5 kg). His temporal temperature is 97.2 F (36.2 C) (abnormal). His blood pressure is 145/87 (abnormal) and his pulse is 109 (abnormal). His respiration is 20 and oxygen saturation is 100%.  BMI: Estimated body mass index is 31.75 kg/m as calculated from the following:   Height as of this  encounter: 5' 9 (1.753 m).   Weight as of this encounter: 215 lb (97.5 kg).   Last encounter:  Visit date not found. Last procedure: Visit date not found.   Reason for encounter: post-procedure evaluation and assessment.    Discussed the use of AI scribe software for clinical note transcription with the patient, who gave verbal consent to proceed.   History of Present Illness   He is a 59 year old male who presents with persistent thoracic pain following a thoracic epidural steroid injection.   He underwent a thoracic epidural steroid injection on October 08, 2024. Initially, he experienced complete pain relief immediately after the procedure, but the pain returned to a level of six out of ten within four to six hours. He reported approximately 50% pain relief for the first week post-procedure, but no relief thereafter.   Prior to the procedure, his pain level was consistently around six out of ten, worsening throughout the day from a two or three in the morning to a six by noon. The pain is described as a shooting pain across the abdomen, radiating from the back around to the front. Previously, lying down provided relief, but now it does not, and he has experienced several sleepless nights due to discomfort.   He takes Advil  and Tylenol , typically once or twice a day, to manage the pain. He has also used ice and heat therapy post-procedure, applying ice for six to eight hours on the day of the procedure and heat the following day.   He denied engaging in any work or activities that could have caused injury. Bending or stretching does not significantly increase his pain. However, activities such as standing or sitting upright for extended periods, or bending slightly while performing tasks, exacerbate his symptoms.   He has no history of diabetes, and his glucose levels have been normal in the past. He recalls having his sugar levels checked by his primary care doctor, but cannot remember the  exact timing of the last test.     Procedure Type:  Inter-Laminar Thoracic Epidural Steroid Block/Injection  #1  Laterality: Left Level: T10-11  DOS: 10/08/2024 Performed by: Wallie Sherry, MD Imaging: Fluoroscopic guidance Anesthesia: Local anesthesia (1-2% Lidocaine ) Purpose: Diagnostic/Therapeutic Indications: Thoracic back pain, radicular pain, with degenerative disc disease severe enough to impact quality of life or function. 1. Radicular pain of thoracic region   2. Foraminal stenosis of thoracic region   3. Compression fracture of thoracic vertebra, unspecified thoracic vertebral level, sequela     NAS-11 Pain score:        Pre-procedure: 4 /10        Post-procedure: 4 /10    Post-Procedure Evaluation    Effectiveness:  Initial hour after procedure: 100 % . Subsequent 4-6 hours post-procedure: 100 % . Analgesia past initial 6 hours: 50 % . Ongoing improvement:  Analgesic:  50% (approximately 7 days, and then pain return to baseline) Function: Somewhat improved ROM: Somewhat improved Interpretation: Mr. Leighton underwent a diagnostic/therapeutic Inter-Laminar Thoracic Epidural Steroid Block/Injection on October 08, 2024.  He reports initially 100% pain relief and functional improvement during local anesthetic phase, followed by sustained ongoing 50% pain relief and functional improvement approximately 7 days and then pain returned to baseline.   PAIN:  Are you having pain? Yes: NPRS scale: 5/10 Pain location: cervical/thoracic/lumbar spine Pain description: sharp in L thoracic spine/ribs/ radiating pain Aggravating factors: prolonged standing/ increase movement Relieving factors: short-term relief with injection  PRECAUTIONS: None  RED FLAGS: None   WEIGHT BEARING RESTRICTIONS: No  FALLS:  Has patient fallen in last 6 months? No  LIVING ENVIRONMENT:  Lives with: lives in an adult home Lives in: House/apartment Stairs: Yes: External: 4 steps; none Has following  equipment at home: Single point cane  OCCUPATION: On disability  PLOF: Independent  PATIENT GOALS: Decrease neck/back pain and increase overall conditioning to improve walking endurance/ functional mobility.    NEXT MD VISIT: 11/05/24  OBJECTIVE:  Note: Objective measures were completed at Evaluation unless otherwise noted.  DIAGNOSTIC FINDINGS:  EXAM: MRI CERVICAL SPINE WITHOUT CONTRAST 08/12/2024 07:42:00 AM   TECHNIQUE: Multiplanar multisequence MRI of the cervical spine was performed.   COMPARISON: CT of the cervical spine dated 07/30/2024.   CLINICAL HISTORY: Abnormal MRI. Weakness in limbs with pain in neck. HA's. Numb/ting in all extremities for 6 months.   FINDINGS:   BONES AND ALIGNMENT: Normal alignment. Normal vertebral body heights. Bone marrow signal is unremarkable.   SPINAL CORD: Normal spinal cord size. No abnormal spinal cord signal. No apparent impingement of the spinal cord.   SOFT TISSUES: No paraspinal mass.   C2-C3: No significant disc herniation. No spinal canal stenosis or neural foraminal narrowing.   C3-C4: Minimal disc bulging and mild bilateral uncovertebral joint hypertrophy with mild central spinal canal stenosis. The neural foramina are patent.   C4-C5: No significant disc herniation. No spinal canal stenosis or neural foraminal narrowing.   C5-C6: Broad-based disc bulge and endplate ridging with mild central spinal canal stenosis and mild right neural foraminal stenosis.   C6-C7: Diffuse disc bulging and left-sided uncovertebral joint hypertrophy, with mild left spinal canal and left neural foraminal stenosis.   C7-T1: No significant disc herniation. No spinal canal stenosis or neural foraminal narrowing.   IMPRESSION: 1. Mild central spinal canal stenosis at C3-4, C5-6, and left C6-7. 2. Mild right neural foraminal stenosis at C5-6 and mild left neural foraminal stenosis at C6-7. 3. No apparent impingement of the  spinal cord or nerve roots.   Electronically signed by: Evalene Coho MD 08/12/2024 08:16 AM  EXAM: MRI THORACIC SPINE WITHOUT CONTRAST   TECHNIQUE: Multiplanar, multisequence MR imaging of the thoracic spine was performed. No intravenous contrast was administered.   COMPARISON:  CT cervical spine 07/30/2024. CTA chest 03/20/2024. CT Abdomen and Pelvis 07/14/2023.   FINDINGS: Limited cervical spine imaging: Largely negative for age, concordant with cervical spine CT earlier this month.   Thoracic spine segmentation:  Normal on previous CTs.   Alignment: Chronic exaggerated lower thoracic kyphosis in conjunction with chronic lower thoracic compression fractures, stable from CT last year. No significant thoracic scoliosis or spondylolisthesis.   Vertebrae: Chronic superior endplate compression fractures of T10, T11 and T12 are stable since last year. No associated marrow edema. Normal background bone marrow signal. Small chronic sclerotic focus superimposed in the right T10 vertebral body also stable by CT since last year and compatible with benign etiology, probably bone island. Stable, maintained thoracic vertebral height elsewhere. No marrow edema or evidence of acute osseous abnormality.   Cord: Normal. Capacious thoracic spinal canal at most levels, see details below. Conus medullaris mostly visible at T12-L1 and appears to be normal.   Paraspinal and other soft tissues: Stable, negative.   Disc levels:   No age advanced thoracic spine degeneration   T1-T2 through T7-T8.   T8-T9: Circumferential disc bulge with mild to moderate facet and ligament flavum hypertrophy. No spinal stenosis. Mild left and moderate to severe right T8 neural foraminal stenosis.   T9-T10: Negative disc. Moderate to severe facet hypertrophy. Mild left and moderate to severe right T9 foraminal stenosis.  T10-T11: Minor disc bulging. Moderate facet and ligament flavum hypertrophy. Mild  T10 foraminal stenosis.   T11-T12: Disc osteophyte complex in part related to chronic retropulsion of the posterosuperior T12 endplate, stable from last year. Moderate posterior element hypertrophy. Borderline to mild spinal stenosis. No cord mass effect. Mild bilateral T11 foraminal stenosis.   IMPRESSION: 1. No acute or suspicious osseous abnormality in the thoracic spine. Chronic T10, T11, and T12 compression fractures are stable from last year. 2. Mild for age thoracic spine degeneration above T8-T9. Mild chronic T12 retropulsion contributes to borderline or mild spinal stenosis at T11-T12. Lower thoracic facet arthropathy results in moderate to severe neural foraminal stenosis at the right T8 and T9 nerve levels.   Electronically Signed   By: VEAR Hurst M.D.   On: 08/08/2024 10:57  MRI LUMBAR SPINE 07/01/2024 02:37:10 PM   TECHNIQUE: Multiplanar multisequence MRI of the lumbar spine was performed without the administration of intravenous contrast.   COMPARISON: MRI of the lumbar spine dated 04/04/2023.   CLINICAL HISTORY: Low back pain, cauda equina syndrome suspected. Pt reports lower back pain that radiates to his abdomen and is now causing him to leak urine. Pt has prostate hx and chronic back pain but the incontinence is new.   FINDINGS:   BONES AND ALIGNMENT: Moderate chronic compression deformity of T12, unchanged from prior study. The vertebral body has lost about 50% of its height anteriorly and there is a chronic schmorl's node within the superior endplate. There is chronic downward bowing of the superior endplates of L1 and L2, as before.   SPINAL CORD: The conus medullaris terminates at L1.   SOFT TISSUES: No paraspinal mass.   L1-L2: No significant disc herniation. No spinal canal stenosis or neural foraminal narrowing.   L2-L3: Mild diffuse disc bulging with mild bilateral lateral recess stenosis.   L3-L4: Slight disc bulging without  significant spinal canal or neural foraminal stenosis.   L4-L5: No significant disc herniation. No spinal canal stenosis or neural foraminal narrowing.   L5-S1: Slight disc bulging without significant spinal canal or neural foraminal stenosis.   OTHER FINDINGS: The left kidney is not visualized.   IMPRESSION: 1. No evidence of cauda equina syndrome. 2. Moderate chronic compression deformity of T12, unchanged. 3. Chronic downward bowing of the superior endplates of L1 and L2, as before. 4. Mild diffuse disc bulging at L2-3 with mild bilateral lateral recess stenosis. 5. Slight disc bulging at L3-4 and L5-S1 without significant spinal canal or neural foraminal stenosis.  PATIENT SURVEYS:  MODI: 68% self-perceived severe disability  COGNITION: Overall cognitive status: Within functional limits for tasks assessed     SENSATION: Light touch: Impaired   MUSCLE LENGTH: Hamstrings: Right 48 deg; Left 33 deg Thomas test: NT  POSTURE: rounded shoulders and forward head  PALPATION: Moderate tenderness in B lumbar paraspinals/ SI region.  Pt. Reports B LE radicular symptoms with prolonged standing.    LUMBAR ROM:  Flexion: limited 25%, extension: limited 50%, L/R rotn. Limited 25% and L/R lateral flexion WFL (L sided rib pain).  5/10 L thoracic/ rib pain.  Mid-thoracic/ lumbar hypomobility with assessment of central/unilateral mobs.    CERVICAL ROM:      Flexion: 41 deg./ extension 35 deg./ L rotn. 44 deg./ R rotn. 48 deg./ R lat. Flexion 40 deg./ L lateral flexion.   B shoulder AROM WFL   LOWER EXTREMITY MMT:    MMT Right eval Left eval  Hip flexion 4+ 4+  Hip extension  Hip abduction 4+ 4+  Hip adduction    Hip internal rotation 5 5  Hip external rotation 5 5  Knee flexion 5 5  Knee extension 5 5  Ankle dorsiflexion    Ankle plantarflexion    Ankle inversion    Ankle eversion     (Blank rows = not tested)  LUMBAR SPECIAL TESTS:  (+) SLR on L.  FUNCTIONAL  TESTS:  5xSTS: 26.6 sec  GAIT: Distance walked: in clinic Assistive device utilized: Single point cane Level of assistance: Modified independence Comments: antalgic gait with increase c/o back pain with distance walked.    5xSTS completed:  -26.6 sec  TREATMENT DATE: 12/04/2024        SUBJECTIVE STATEMENT:   Pt feeling pretty good today. Had a more of a rough day yesterday could only do half of his workout at [employer] because of pain and gym was very busy. Pt reported he is going to follow up with his pain doctor next week. Reports no pain today.    Therapeutic Exercise: - for improved soft tissue flexibility and extensibility as needed for ROM, improved strength as needed to improve performance of CKC activities/functional movements  NuStep L5 for 10 min. B UE/LE to improve soft tissue mobility and increased tissue temperature to improve muscle performance.     N[place]ilus: Seated Lat pull downs 60# 2x10. Standing Retraction 2x12 50#.   Pallof press at [place] 40# 15x then 50# on second set 15x on L/R with cuing for proper technique.   Nautilus: Resisted lateral walking 80# L/R 3 laps to end of blue line, cuing for slow even steps and good upward posture.   Manual therapy:  for pain modulation  Supine LE/lumbar stretches (generalized)- focus on hamstring/ piriformis/ rotn. Stretches.    Prone STM to thoracic/lumbar paraspinals.  Grade III PA unilateral/central mobs from mid-thoracic to lumbar spine.  Significant hypomobility noted in mid-back.    STM and IASTM with Hypervolt along T7-T12 thoracic paraspinals, bilateral lumbar erector spinae in sitting; x 12 minutes   PATIENT EDUCATION:  Education details: Spine hypomobility/ functional goals/ Artist Person educated: Patient Education method: Explanation Education comprehension: verbalized understanding  HOME EXERCISE PROGRAM: Access Code: [email or web address] URL: [email or web address] Date:  11/06/2024 Prepared by: Venetia Endo  Exercises - Static Prone on Elbows  - 3-4 x daily - 7 x weekly - 1 sets - 3-4 min hold - Hooklying Lumbar Traction  - 7 x weekly - 10 reps - 5-10sec hold - Supine Lower Trunk Rotation  - 2 x daily - 7 x weekly - 2 sets - 10 reps - Supine Bridge  - 2 x daily - 7 x weekly - 2 sets - 10 reps - Seated Scapular Retraction  - 2 x daily - 7 x weekly - 2 sets - 10 reps - 3sec hold  Access Code: [email or web address] URL: [email or web address] Date: 11/19/2024 Prepared by: Ozell Sero  Exercises - Static Prone on Elbows  - 3-4 x daily - 7 x weekly - 1 sets - 3-4 min hold - Hooklying Lumbar Traction  - 7 x weekly - 10 reps - 5-10sec hold - Supine Lower Trunk Rotation  - 2 x daily - 7 x weekly - 2 sets - 10 reps - Supine Bridge  - 2 x daily - 7 x weekly - 2 sets - 10 reps - Scapular Retraction with Resistance  - 1 x daily - 4 x weekly - 2 sets - 15 reps - Shoulder W - External Rotation  with Resistance  - 1 x daily - 4 x weekly - 2 sets - 15 reps   ASSESSMENT:  CLINICAL IMPRESSION: Pt  reports minimal to no back pain today. Continues to feel referral to L abdominal region.  Pt is very motivated to do a gym based exercise program. Nilsa was only able to do half of his workout because his back was hurting and the gym was very busy. Pt did very well with the lat pull downs with no c/o back pain.  Pt had no increase pain with the increase of weight of the Pallof press. Patient will benefit from continued skilled PT services to increase spinal mobility/ generalized strengthening and conditioning to progress to [employer] ex. Program/ walking program.    OBJECTIVE IMPAIRMENTS: Abnormal gait, decreased activity tolerance, decreased balance, decreased coordination, decreased endurance, decreased mobility, difficulty walking, decreased ROM, decreased strength, hypomobility, impaired flexibility, improper body mechanics, postural dysfunction, and pain.    ACTIVITY LIMITATIONS: carrying, lifting, bending, standing, squatting, and locomotion level  PARTICIPATION LIMITATIONS: cleaning and community activity  PERSONAL FACTORS: Fitness and Past/current experiences are also affecting patient's functional outcome.   REHAB POTENTIAL: Good  CLINICAL DECISION MAKING: Evolving/moderate complexity  EVALUATION COMPLEXITY: Moderate   GOALS: Goals reviewed with patient? Yes  SHORT TERM GOALS: Target date: 11/24/24  Pt. Will be independent with HEP to increase B hip strength 1/2 muscle grade to improve walking/ stair climbing.   Baseline:  see above Goal status: Partially met  2.  Pt. Able to complete 30 minutes of exercise with no increase c/o back pain or radicular symptoms to improve pain-free mobility.  Baseline:  5/10 back pain at rest and increase pain/ radicular symptoms with activity Goal status: Partially met  LONG TERM GOALS: Target date: 12/15/24  Pt. Will decrease MODI to <50% to improve self-perceived pain-free mobility. Baseline: 68% self-perceived severe disability Goal status: INITIAL  2.  Pt. Will complete 5xSTS and decrease time by 3 seconds to improve pain-free mobility.  Baseline: 26.6 sec  Goal status: INITIAL  3.  Pt. Able to walk 5 blocks from house to regular meetings with no increase c/o back pain/ improved endurance.   Baseline: limited walking distance due to moderate back pain.   Goal status: INITIAL  4.  Pt. Will join/ participate with exercise program at [facility] with no increase c/o back pain.   Baseline: currently not exercising Goal status: INITIAL  PLAN:  PT FREQUENCY: 2x/week  PT DURATION: 6 weeks  PLANNED INTERVENTIONS: 97110-Therapeutic exercises, 97530- Therapeutic activity, [id number]- Neuromuscular re-education, 97535- Self Care, [id number]- Manual therapy, [phone]- Gait training, [phone]- Electrical stimulation (unattended), and Patient/Family education.  PLAN FOR NEXT SESSION: ISSUE new HEP with  [employer]/ check MODI  Ozell JAYSON Sero, PT, DPT # [phone] Rankin Gainer, SPT 12/04/2024, 7:25 AM  "

## 2024-12-04 ENCOUNTER — Encounter: Payer: Self-pay | Admitting: Physical Therapy

## 2024-12-05 ENCOUNTER — Other Ambulatory Visit

## 2024-12-05 DIAGNOSIS — M549 Dorsalgia, unspecified: Secondary | ICD-10-CM

## 2024-12-05 DIAGNOSIS — Z8781 Personal history of (healed) traumatic fracture: Secondary | ICD-10-CM | POA: Diagnosis not present

## 2024-12-05 DIAGNOSIS — M47816 Spondylosis without myelopathy or radiculopathy, lumbar region: Secondary | ICD-10-CM

## 2024-12-08 ENCOUNTER — Ambulatory Visit: Admitting: Physical Therapy

## 2024-12-09 ENCOUNTER — Ambulatory Visit
Attending: Student in an Organized Health Care Education/Training Program | Admitting: Student in an Organized Health Care Education/Training Program

## 2024-12-09 ENCOUNTER — Encounter: Payer: Self-pay | Admitting: Student in an Organized Health Care Education/Training Program

## 2024-12-09 VITALS — BP 137/93 | HR 99 | Temp 97.3°F | Resp 16 | Ht 69.0 in | Wt 210.0 lb

## 2024-12-09 DIAGNOSIS — M5412 Radiculopathy, cervical region: Secondary | ICD-10-CM | POA: Diagnosis not present

## 2024-12-09 DIAGNOSIS — M47894 Other spondylosis, thoracic region: Secondary | ICD-10-CM | POA: Insufficient documentation

## 2024-12-09 DIAGNOSIS — S22000S Wedge compression fracture of unspecified thoracic vertebra, sequela: Secondary | ICD-10-CM | POA: Diagnosis not present

## 2024-12-09 DIAGNOSIS — G894 Chronic pain syndrome: Secondary | ICD-10-CM | POA: Insufficient documentation

## 2024-12-09 NOTE — Patient Instructions (Addendum)
 Epidural Steroid Injection Patient Information  Description: The epidural space surrounds the nerves as they exit the spinal cord.  In some patients, the nerves can be compressed and inflamed by a bulging disc or a tight spinal canal (spinal stenosis).  By injecting steroids into the epidural space, we can bring irritated nerves into direct contact with a potentially helpful medication.  These steroids act directly on the irritated nerves and can reduce swelling and inflammation which often leads to decreased pain.  Epidural steroids may be injected anywhere along the spine and from the neck to the low back depending upon the location of your pain.   After numbing the skin with local anesthetic (like Novocaine), a small needle is passed into the epidural space slowly.  You may experience a sensation of pressure while this is being done.  The entire block usually last less than 10 minutes.  Conditions which may be treated by epidural steroids:  Low back and leg pain Neck and arm pain Spinal stenosis Post-laminectomy syndrome Herpes zoster (shingles) pain Pain from compression fractures  Preparation for the injection:  Do not eat any solid food or dairy products within 8 hours of your appointment.  You may drink clear liquids up to 3 hours before appointment.  Clear liquids include water , black coffee, juice or soda.  No milk or cream please. You may take your regular medication, including pain medications, with a sip of water  before your appointment  Diabetics should hold regular insulin (if taken separately) and take 1/2 normal NPH dos the morning of the procedure.  Carry some sugar containing items with you to your appointment. A driver must accompany you and be prepared to drive you home after your procedure.  Bring all your current medications with your. An IV may be inserted and sedation may be given at the discretion of the physician.   A blood pressure cuff, EKG and other monitors will  often be applied during the procedure.  Some patients may need to have extra oxygen administered for a short period. You will be asked to provide medical information, including your allergies, prior to the procedure.  We must know immediately if you are taking blood thinners (like Coumadin/Warfarin)  Or if you are allergic to IV iodine contrast (dye). We must know if you could possible be pregnant.  Possible side-effects: Bleeding from needle site Infection (rare, may require surgery) Nerve injury (rare) Numbness & tingling (temporary) Difficulty urinating (rare, temporary) Spinal headache ( a headache worse with upright posture) Light -headedness (temporary) Pain at injection site (several days) Decreased blood pressure (temporary) Weakness in arm/leg (temporary) Pressure sensation in back/neck (temporary)  Call if you experience: Fever/chills associated with headache or increased back/neck pain. Headache worsened by an upright position. New onset weakness or numbness of an extremity below the injection site Hives or difficulty breathing (go to the emergency room) Inflammation or drainage at the infection site Severe back/neck pain Any new symptoms which are concerning to you  Please note:  Although the local anesthetic injected can often make your back or neck feel good for several hours after the injection, the pain will likely return.  It takes 3-7 days for steroids to work in the epidural space.  You may not notice any pain relief for at least that one week.  If effective, we will often do a series of three injections spaced 3-6 weeks apart to maximally decrease your pain.  After the initial series, we generally will wait several months before  considering a repeat injection of the same type.  If you have any questions, please call [phone] [facility]  Facet Blocks Patient Information  Description: The facets are joints in the spine  between the vertebrae.  Like any joints in the body, facets can become irritated and painful.  Arthritis can also effect the facets.  By injecting steroids and local anesthetic in and around these joints, we can temporarily block the nerve supply to them.  Steroids act directly on irritated nerves and tissues to reduce selling and inflammation which often leads to decreased pain.  Facet blocks may be done anywhere along the spine from the neck to the low back depending upon the location of your pain.   After numbing the skin with local anesthetic (like Novocaine), a small needle is passed onto the facet joints under x-ray guidance.  You may experience a sensation of pressure while this is being done.  The entire block usually lasts about 15-25 minutes.   Conditions which may be treated by facet blocks:  Low back/buttock pain Neck/shoulder pain Certain types of headaches  Preparation for the injection:  Do not eat any solid food or dairy products within 8 hours of your appointment. You may drink clear liquid up to 3 hours before appointment.  Clear liquids include water , black coffee, juice or soda.  No milk or cream please. You may take your regular medication, including pain medications, with a sip of water  before your appointment.  Diabetics should hold regular insulin (if taken separately) and take 1/2 normal NPH dose the morning of the procedure.  Carry some sugar containing items with you to your appointment. A driver must accompany you and be prepared to drive you home after your procedure. Bring all your current medications with you. An IV may be inserted and sedation may be given at the discretion of the physician. A blood pressure cuff, EKG and other monitors will often be applied during the procedure.  Some patients may need to have extra oxygen administered for a short period. You will be asked to provide medical information, including your allergies and medications, prior to the  procedure.  We must know immediately if you are taking blood thinners (like Coumadin/Warfarin) or if you are allergic to IV iodine contrast (dye).  We must know if you could possible be pregnant.  Possible side-effects:  Bleeding from needle site Infection (rare, may require surgery) Nerve injury (rare) Numbness & tingling (temporary) Difficulty urinating (rare, temporary) Spinal headache (a headache worse with upright posture) Light-headedness (temporary) Pain at injection site (serveral days) Decreased blood pressure (rare, temporary) Weakness in arm/leg (temporary) Pressure sensation in back/neck (temporary)   Call if you experience:  Fever/chills associated with headache or increased back/neck pain Headache worsened by an upright position New onset, weakness or numbness of an extremity below the injection site Hives or difficulty breathing (go to the emergency room) Inflammation or drainage at the injection site(s) Severe back/neck pain greater than usual New symptoms which are concerning to you  Please note:  Although the local anesthetic injected can often make your back or neck feel good for several hours after the injection, the pain will likely return. It takes 3-7 days for steroids to work.  You may not notice any pain relief for at least one week.  If effective, we will often do a series of 2-3 injections spaced 3-6 weeks apart to maximally decrease your pain.  After the initial series, you may  be a candidate for a more permanent nerve block of the facets.  If you have any questions, please call #[phone] [facility]

## 2024-12-09 NOTE — Progress Notes (Signed)
 Safety precautions to be maintained throughout the outpatient stay will include: orient to surroundings, keep bed in low position, maintain call bell within reach at all times, provide assistance with transfer out of bed and ambulation.

## 2024-12-09 NOTE — Progress Notes (Signed)
 PROVIDER NOTE: Interpretation of information contained herein should be left to medically-trained personnel. Specific patient instructions are provided elsewhere under Patient Instructions section of medical record. This document was created in part using AI and STT-dictation technology, any transcriptional errors that may result from this process are unintentional.  Patient: Frank Terry  Service: E/M   PCP: Theophilus Pagan, MD  DOB: 03/28/1966  DOS: 12/09/2024  Provider: Wallie Sherry, MD  MRN: [id number]  Delivery: Face-to-face  Specialty: Interventional Pain Management  Type: Established Patient  Setting: Ambulatory outpatient facility  Specialty designation: 09  Referring Prov.: Theophilus Pagan, MD  Location: Outpatient office facility       History of present illness (HPI) [name], a 59 y.o. year old male, is here today because of his Cervical radicular pain [M54.12]. [name] primary complain today is Back Pain (thoracic)  Pain Assessment: Severity of Chronic pain is reported as a 6 /10. Location: Back Mid/around right and left sides to abdomen. Onset: More than a month ago. Quality: Aching, Sharp. Timing: Intermittent. Modifying factor(s): laying down. Vitals:  height is 5' 9 (1.753 m) and weight is 210 lb (95.3 kg). His temperature is 97.3 F (36.3 C) (abnormal). His blood pressure is 137/93 (abnormal) and his pulse is 99. His respiration is 16 and oxygen saturation is 96%.  BMI: Estimated body mass index is 31.01 kg/m as calculated from the following:   Height as of this encounter: 5' 9 (1.753 m).   Weight as of this encounter: 210 lb (95.3 kg).  Last encounter: 09/23/2024. Last procedure: 10/08/2024.  Reason for encounter:   Discussed the use of AI scribe software for clinical note transcription with the patient, who gave verbal consent to proceed.  History of Present Illness   Frank Terry is a 59 year old male who presents with persistent mid back and neck  pain.  He experiences persistent mid back pain that has been ongoing despite previous interventions. An epidural injection at the left T10, T11 on November 11th provided relief for only one day. Approximately three weeks ago, the pain began to radiate to the right side, wrapping around both sides of his body. The pain originates in the back and becomes more noticeable when it wraps around, significantly impacting his ability to stand or perform activities for more than fifteen minutes without needing to lie down for relief. He has been undergoing physical therapy and has had additional x-rays taken, though the results have not been communicated to him. Sitting does not alleviate the pain, and he must lie down to achieve relief.  In addition to mid back pain, he experiences neck pain with sensations described as 'electrical shocks' in the biceps, but not extending to the forearms or fingers.  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  He denies being on any blood thinners and has not used sedation for previous procedures, opting for local anesthesia instead.       ROS  Constitutional: Denies any fever or chills Gastrointestinal: No reported hemesis, hematochezia, vomiting, or acute GI distress Musculoskeletal: Cervicalgia, midthoracic pain Neurological: No reported episodes of acute onset apraxia, aphasia, dysarthria, agnosia, amnesia, paralysis, loss of coordination, or loss of consciousness  Medication Review  acetaminophen , albuterol , cetirizine, colchicine , irbesartan , nortriptyline , omeprazole, and rizatriptan   History Review  Allergy: Frank Terry is allergic to penicillins. Drug: Frank Terry  reports that he does  not currently use drugs. Alcohol:  reports that he does not currently use alcohol after a past usage of about 168.0 standard drinks of alcohol per  week. Tobacco:  reports that he has quit smoking. His smoking use included cigarettes. He started smoking about 42 years ago. He uses smokeless tobacco. Social: Frank Terry  reports that he has quit smoking. His smoking use included cigarettes. He started smoking about 42 years ago. He uses smokeless tobacco. He reports that he does not currently use alcohol after a past usage of about 168.0 standard drinks of alcohol per week. He reports that he does not currently use drugs. Medical:  has a past medical history of Alcohol use disorder, Allergy, Anxiety, Arthritis, Asthma, Compression fracture of spine (HCC), Congenital absence of one kidney, Depression, Gastric ulcer, GERD (gastroesophageal reflux disease), Gout, HTN (hypertension), Hyponatremia, Osteoporosis, Prostate cancer (HCC), Ulcer, and Vasitis (05/2023). Surgical: Frank Terry  has a past surgical history that includes Tonsillectomy; Colonoscopy with esophagogastroduodenoscopy (egd); Prostate biopsy; Robot assisted laparoscopic radical prostatectomy (N/A, 06/18/2023); Pelvic lymph node dissection (Bilateral, 06/18/2023); Mass excision (Left, 09/19/2023); and Excision mass head (Left, 10/29/2024). Family: family history includes Alcohol abuse in his mother; Arthritis in his mother; Cancer in his father and mother; Heart attack in his father; Heart disease in his mother; Hypertension in his father, mother, and sister; Lung cancer in his mother; Multiple sclerosis in his sister; Osteoporosis in his mother.  Laboratory Chemistry Profile   Renal Lab Results  Component Value Date   BUN 20 08/15/2024   CREATININE 1.13 08/15/2024   BCR 17 03/03/2024   GFRAA >60 10/31/2013   GFRNONAA >60 08/15/2024    Hepatic Lab Results  Component Value Date   AST 15 08/15/2024   ALT 13 08/15/2024   ALBUMIN 4.2 08/15/2024   ALKPHOS 77 08/15/2024   LIPASE 35 03/20/2024    Electrolytes Lab Results  Component Value Date   NA 135 08/15/2024   K 3.8 08/15/2024    CL 104 08/15/2024   CALCIUM  9.5 08/15/2024   MG 2.2 03/05/2023   PHOS 3.3 03/04/2023    Bone Lab Results  Component Value Date   VD25OH 30.6 09/25/2023    Inflammation (CRP: Acute Phase) (ESR: Chronic Phase) Lab Results  Component Value Date   CRP 1 04/02/2024   ESRSEDRATE 6 04/02/2024   LATICACIDVEN 1.0 01/12/2024         Note: Above Lab results reviewed.  Recent Imaging Review  MRI CERVICAL SPINE WITHOUT CONTRAST 08/12/2024 07:42:00 AM   TECHNIQUE: Multiplanar multisequence MRI of the cervical spine was performed.   COMPARISON: CT of the cervical spine dated 07/30/2024.   CLINICAL HISTORY: Abnormal MRI. Weakness in limbs with pain in neck. HA's. Numb/ting in all extremities for 6 months.   FINDINGS:   BONES AND ALIGNMENT: Normal alignment. Normal vertebral body heights. Bone marrow signal is unremarkable.   SPINAL CORD: Normal spinal cord size. No abnormal spinal cord signal. No apparent impingement of the spinal cord.   SOFT TISSUES: No paraspinal mass.   C2-C3: No significant disc herniation. No spinal canal stenosis or neural foraminal narrowing.   C3-C4: Minimal disc bulging and mild bilateral uncovertebral joint hypertrophy with mild central spinal canal stenosis. The neural foramina are patent.   C4-C5: No significant disc herniation. No spinal canal stenosis or neural foraminal narrowing.   C5-C6: Broad-based disc bulge and endplate ridging with mild central spinal canal stenosis and mild right neural foraminal stenosis.   C6-C7: Diffuse disc bulging and  left-sided uncovertebral joint hypertrophy, with mild left spinal canal and left neural foraminal stenosis.   C7-T1: No significant disc herniation. No spinal canal stenosis or neural foraminal narrowing.   IMPRESSION: 1. Mild central spinal canal stenosis at C3-4, C5-6, and left C6-7. 2. Mild right neural foraminal stenosis at C5-6 and mild left neural foraminal stenosis at  C6-7. 3. No apparent impingement of the spinal cord or nerve roots.   Electronically signed by: Evalene Coho MD 08/12/2024 08:16 AM EDT RP Workstation: [id number]   CLINICAL DATA:  59 year old male with back pain. Left side pain. Prostate cancer.   EXAM: MRI THORACIC SPINE WITHOUT CONTRAST   TECHNIQUE: Multiplanar, multisequence MR imaging of the thoracic spine was performed. No intravenous contrast was administered.   COMPARISON:  CT cervical spine 07/30/2024. CTA chest 03/20/2024. CT Abdomen and Pelvis 07/14/2023.   FINDINGS: Limited cervical spine imaging: Largely negative for age, concordant with cervical spine CT earlier this month.   Thoracic spine segmentation:  Normal on previous CTs.   Alignment: Chronic exaggerated lower thoracic kyphosis in conjunction with chronic lower thoracic compression fractures, stable from CT last year. No significant thoracic scoliosis or spondylolisthesis.   Vertebrae: Chronic superior endplate compression fractures of T10, T11 and T12 are stable since last year. No associated marrow edema. Normal background bone marrow signal. Small chronic sclerotic focus superimposed in the right T10 vertebral body also stable by CT since last year and compatible with benign etiology, probably bone island. Stable, maintained thoracic vertebral height elsewhere. No marrow edema or evidence of acute osseous abnormality.   Cord: Normal. Capacious thoracic spinal canal at most levels, see details below. Conus medullaris mostly visible at T12-L1 and appears to be normal.   Paraspinal and other soft tissues: Stable, negative.   Disc levels:   No age advanced thoracic spine degeneration   T1-T2 through T7-T8.   T8-T9: Circumferential disc bulge with mild to moderate facet and ligament flavum hypertrophy. No spinal stenosis. Mild left and moderate to severe right T8 neural foraminal stenosis.   T9-T10: Negative disc. Moderate to severe facet  hypertrophy. Mild left and moderate to severe right T9 foraminal stenosis.   T10-T11: Minor disc bulging. Moderate facet and ligament flavum hypertrophy. Mild T10 foraminal stenosis.   T11-T12: Disc osteophyte complex in part related to chronic retropulsion of the posterosuperior T12 endplate, stable from last year. Moderate posterior element hypertrophy. Borderline to mild spinal stenosis. No cord mass effect. Mild bilateral T11 foraminal stenosis.   IMPRESSION: 1. No acute or suspicious osseous abnormality in the thoracic spine. Chronic T10, T11, and T12 compression fractures are stable from last year. 2. Mild for age thoracic spine degeneration above T8-T9. Mild chronic T12 retropulsion contributes to borderline or mild spinal stenosis at T11-T12. Lower thoracic facet arthropathy results in moderate to severe neural foraminal stenosis at the right T8 and T9 nerve levels.  Note: Reviewed        Physical Exam  Vitals: BP (!) 137/93   Pulse 99   Temp (!) 97.3 F (36.3 C)   Resp 16   Ht 5' 9 (1.753 m)   Wt 210 lb (95.3 kg)   SpO2 96%   BMI 31.01 kg/m  BMI: Estimated body mass index is 31.01 kg/m as calculated from the following:   Height as of this encounter: 5' 9 (1.753 m).   Weight as of this encounter: 210 lb (95.3 kg). Ideal: Ideal body weight: 70.7 kg (155 lb 13.8 oz) Adjusted ideal body weight: 80.5 kg (  177 lb 8.3 oz) General appearance: Well nourished, well developed, and well hydrated. In no apparent acute distress Mental status: Terry, oriented x 3 (person, place, & time)       Respiratory: No evidence of acute respiratory distress Eyes: PERLA  Cervicalgia with pain radiating to right bicep  Cervical Spine Area Exam  Skin & Axial Inspection: No masses, redness, edema, swelling, or associated skin lesions Alignment: Symmetrical Functional ROM: Pain restricted ROM, bilaterally Stability: No instability detected Muscle Tone/Strength: Functionally intact. No  obvious neuro-muscular anomalies detected. Sensory (Neurological): Dermatomal pain pattern Palpation: No palpable anomalies             Upper Extremity (UE) Exam    Side: Right upper extremity  Side: Left upper extremity  Skin & Extremity Inspection: Skin color, temperature, and hair growth are WNL. No peripheral edema or cyanosis. No masses, redness, swelling, asymmetry, or associated skin lesions. No contractures.  Skin & Extremity Inspection: Skin color, temperature, and hair growth are WNL. No peripheral edema or cyanosis. No masses, redness, swelling, asymmetry, or associated skin lesions. No contractures.  Functional ROM: Unrestricted ROM          Functional ROM: Unrestricted ROM          Muscle Tone/Strength: Functionally intact. No obvious neuro-muscular anomalies detected.  Muscle Tone/Strength: Functionally intact. No obvious neuro-muscular anomalies detected.  Sensory (Neurological): Unimpaired          Sensory (Neurological): Unimpaired          Palpation: No palpable anomalies              Palpation: No palpable anomalies              Provocative Test(s):  Phalen's test: deferred Tinel's test: deferred Apley's scratch test (touch opposite shoulder):  Action 1 (Across chest): deferred Action 2 (Overhead): deferred Action 3 (LB reach): deferred   Provocative Test(s):  Phalen's test: deferred Tinel's test: deferred Apley's scratch test (touch opposite shoulder):  Action 1 (Across chest): deferred Action 2 (Overhead): deferred Action 3 (LB reach): deferred    Thoracic Spine Area Exam  Skin & Axial Inspection: No masses, redness, or swelling Alignment: Symmetrical Functional ROM: Pain restricted ROM Stability: No instability detected Muscle Tone/Strength: Functionally intact. No obvious neuro-muscular anomalies detected. Sensory (Neurological): Musculoskeletal pain pattern Muscle strength & Tone: Complains of area being tender to palpation Lumbar Spine Area Exam  Skin &  Axial Inspection: No masses, redness, or swelling Alignment: Symmetrical Functional ROM: Unrestricted ROM       Stability: No instability detected Muscle Tone/Strength: Functionally intact. No obvious neuro-muscular anomalies detected. Sensory (Neurological): Unimpaired  Gait & Posture Assessment  Ambulation: Unassisted Gait: Relatively normal for age and body habitus Posture: WNL  Lower Extremity Exam    Side: Right lower extremity  Side: Left lower extremity  Stability: No instability observed          Stability: No instability observed          Skin & Extremity Inspection: Skin color, temperature, and hair growth are WNL. No peripheral edema or cyanosis. No masses, redness, swelling, asymmetry, or associated skin lesions. No contractures.  Skin & Extremity Inspection: Skin color, temperature, and hair growth are WNL. No peripheral edema or cyanosis. No masses, redness, swelling, asymmetry, or associated skin lesions. No contractures.  Functional ROM: Unrestricted ROM                  Functional ROM: Unrestricted ROM  Muscle Tone/Strength: Functionally intact. No obvious neuro-muscular anomalies detected.  Muscle Tone/Strength: Functionally intact. No obvious neuro-muscular anomalies detected.  Sensory (Neurological): Unimpaired        Sensory (Neurological): Unimpaired        DTR: Patellar: deferred today Achilles: deferred today Plantar: deferred today  DTR: Patellar: deferred today Achilles: deferred today Plantar: deferred today  Palpation: No palpable anomalies  Palpation: No palpable anomalies    Assessment   Diagnosis  1. Cervical radicular pain   2. Thoracic facet joint syndrome   3. Compression fracture of thoracic vertebra, unspecified thoracic vertebral level, sequela   4. Chronic pain syndrome      Updated Problems: Problem  Cervical Radicular Pain    Plan of Care  The patient has chronic thoracic and cervical spine pain with multiple  contributing pain generators. He has a known chronic T12 vertebral compression fracture causing significant axial thoracic pain that is worsened with movement and improved with lying down. He previously underwent thoracic epidural steroid injections at T10 and T11 with minimal benefit. In addition, he has features consistent with thoracic facet joint-mediated pain, characterized by mid-back pain wrapping around both sides and worsening with prolonged sitting and standing. He also has cervical radiculopathy due to disc pathology at C5-6 with bilateral foraminal stenosis, resulting in electrical shock-like sensations radiating into the biceps, with prior ENT evaluation unrevealing for alternative pathology.  Given the persistence of symptoms despite conservative management and prior limited response to epidural therapy, the plan is to proceed with a cervical epidural steroid injection address his cervical radicular pain and upper extremity symptoms. For his thoracic pain, diagnostic thoracic facet medial branch nerve blocks will be performed at T10, T11, and T12 to assess the contribution of facet-mediated pain and determine candidacy for longer-term interventional treatment. If he experiences significant but temporary relief from these diagnostic blocks, options will include thoracic radiofrequency ablation versus consideration of spinal cord stimulator therapy, particularly given the chronic nature of his compression fracture-related pain and prior limited response to epidural injections.  The procedures will be scheduled within the next one to two weeks, preferably on a Monday if available. Oral diazepam (Valium) will be offered pre-procedure for anxiolysis if needed. The patient will be reassessed after the interventions to evaluate pain relief, functional improvement, and the most appropriate next steps in management.   Orders:  Orders Placed This Encounter  Procedures   Cervical Epidural Injection     Sedation: Patient's choice. Purpose: Diagnostic/Therapeutic Indication(s): Radiculitis and cervicalgia associater with cervical degenerative disc disease.    Standing Status:   Future    Expiration Date:   03/09/2025    Scheduling Instructions:     Procedure: Cervical Epidural Steroid Injection/Block     Level(s): C7-T1     Laterality: TBD     Timeframe: As soon as schedule allows.    Where will this procedure be performed?:   [facility]             Allicia Culley   THORACIC FACET BLOCK    Standing Status:   Future    Expected Date:   12/23/2024    Expiration Date:   12/09/2025    Scheduling Instructions:     Thoracic Medial Branch Block     Side: Bilateral T11-T12     Sedation: Patient's choice.     Timeframe: ASAA    Where will this procedure be performed?:   [facility]     Left T10/T11 ESI (unable to enter  T9/T10)   Return in about 13 days (around 12/22/2024) for Left C-ESI + B/L T11, T12 MBNB, in clinic NS.    Recent Visits Date Type Provider Dept  11/05/24 Office Visit Patel, Seema K, NP [facility]  10/08/24 Procedure visit Marcelino Nurse, MD [facility]  09/23/24 Office Visit Marcelino Nurse, MD [facility]  Showing recent visits within past 90 days and meeting all other requirements Today's Visits Date Type Provider Dept  12/09/24 Office Visit Marcelino Nurse, MD [facility]  Showing today's visits and meeting all other requirements Future Appointments Date Type Provider Dept  12/22/24 Appointment Marcelino Nurse, MD [facility]  Showing future appointments within next 90 days and meeting all other requirements  I discussed the assessment and treatment plan with the patient. The patient was provided an opportunity to ask questions and all were answered. The patient agreed with the plan and demonstrated an understanding of the instructions.  Patient advised to call back or seek an in-person evaluation if the symptoms  or condition worsens.  I personally spent a total of 30 minutes in the care of the patient today including preparing to see the patient, getting/reviewing separately obtained history, performing a medically appropriate exam/evaluation, counseling and educating, placing orders, and documenting clinical information in the EHR.   Note by: Nurse Marcelino, MD (TTS and AI technology used. I apologize for any typographical errors that were not detected and corrected.) Date: 12/09/2024; Time: 10:15 AM

## 2024-12-10 ENCOUNTER — Ambulatory Visit: Admitting: Physical Therapy

## 2024-12-10 ENCOUNTER — Encounter: Payer: Self-pay | Admitting: Physical Therapy

## 2024-12-10 DIAGNOSIS — R5381 Other malaise: Secondary | ICD-10-CM

## 2024-12-10 DIAGNOSIS — M542 Cervicalgia: Secondary | ICD-10-CM

## 2024-12-10 DIAGNOSIS — M5459 Other low back pain: Secondary | ICD-10-CM

## 2024-12-10 DIAGNOSIS — M2560 Stiffness of unspecified joint, not elsewhere classified: Secondary | ICD-10-CM

## 2024-12-10 DIAGNOSIS — G8929 Other chronic pain: Secondary | ICD-10-CM

## 2024-12-10 DIAGNOSIS — M6281 Muscle weakness (generalized): Secondary | ICD-10-CM

## 2024-12-10 NOTE — Therapy (Signed)
 " OUTPATIENT PHYSICAL THERAPY THORACOLUMBAR TREATMENT  Patient Name: Frank Terry MRN: [id number] DOB:1966-05-14, 59 y.o., male Today's Date: 12/10/2024  END OF SESSION:  PT End of Session - 12/10/24 0810     Visit Number 8    Number of Visits 12    Date for Recertification  12/15/24    PT Start Time 0815    PT Stop Time 0915    PT Time Calculation (min) 60 min    Activity Tolerance Patient tolerated treatment well;No increased pain    Behavior During Therapy WFL for tasks assessed/performed          Past Medical History:  Diagnosis Date   Alcohol use disorder    pt currently lives at the [facility]   Allergy    Anxiety    Arthritis    Asthma    well controlled   Compression fracture of spine (HCC)    Congenital absence of one kidney    pt has right kidney   Depression    Gastric ulcer    GERD (gastroesophageal reflux disease)    no meds   Gout    HTN (hypertension)    Hyponatremia    Osteoporosis    Prostate cancer (HCC)    Ulcer    Vasitis 05/2023   Past Surgical History:  Procedure Laterality Date   COLONOSCOPY WITH ESOPHAGOGASTRODUODENOSCOPY (EGD)     EXCISION MASS HEAD Left 10/29/2024   Procedure: Facial mass excision;  Surgeon: Frank Riis, DO;  Location:  [facility];  Service: ENT;  Laterality: Left;   MASS EXCISION Left 09/19/2023   Procedure: EXCISION MASS, deep soft tissue mass back;  Surgeon: Frank Shope, MD;  Location: [facility];  Service: General;  Laterality: Left;   PELVIC LYMPH NODE DISSECTION Bilateral 06/18/2023   Procedure: PELVIC LYMPH NODE DISSECTION;  Surgeon: Terry Knee, MD;  Location: [facility];  Service: Urology;  Laterality: Bilateral;   PROSTATE BIOPSY     ROBOT ASSISTED LAPAROSCOPIC RADICAL PROSTATECTOMY N/A 06/18/2023   Procedure: XI ROBOTIC ASSISTED LAPAROSCOPIC RADICAL PROSTATECTOMY;  Surgeon: Terry Knee, MD;  Location: [facility];  Service: Urology;  Laterality: N/A;   TONSILLECTOMY     age 59    Patient Active Problem List   Diagnosis Date Noted   Cervical radicular pain 12/09/2024   Radicular pain of thoracic region 11/03/2024   Foraminal stenosis of thoracic region 11/03/2024   Thoracic facet joint syndrome 11/03/2024   History of substance abuse (HCC) 11/03/2024   Chronic pain syndrome 11/03/2024   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   New daily persistent headache 09/25/2023   Soft tissue mass 09/11/2023   Nodule of skin of back 08/16/2023   Nodule of skin of head 08/16/2023   Blurry vision 08/16/2023   Allergic rhinitis 08/10/2023   Osteoporosis 08/10/2023   Skin lesion 08/10/2023   Fatigue 04/06/2023   Terry pain, bilateral 03/19/2023   Compression fracture of spine (HCC) 03/19/2023   Anxious mood 03/19/2023   B12 deficiency 03/19/2023   Hypertension 03/03/2023   Gastric ulcer 03/03/2023   Hyponatremia 03/02/2023   Asthma, chronic 03/02/2023   Gout 03/02/2023   Prostate cancer (HCC) 03/02/2023   Spondylosis of lumbar region without myelopathy or radiculopathy 03/05/2020   Diverticulosis of intestine with bleeding 01/28/2016   Congenital absence of left kidney 01/28/2016   PCP: Frank Pagan, MD  REFERRING PROVIDER: Hilma Hastings, PA-C  REFERRING DIAG:  Diagnosis  M47.816 (ICD-10-CM) - Lumbar spondylosis  M54.16 (ICD-10-CM) - Lumbar radiculopathy  M54.12 (ICD-10-CM) - Cervical radiculopathy  M47.812 (ICD-10-CM) - Cervical spondylosis  M47.814 (ICD-10-CM) - Thoracic spondylosis   Rationale for Evaluation and Treatment: Rehabilitation  THERAPY DIAG:  Other low back pain  Neck pain  Muscle weakness (generalized)  Joint stiffness of spine  Physical deconditioning  Chronic left-sided thoracic back pain  ONSET DATE: chronic  SUBJECTIVE:                                                                                                                                                                                           SUBJECTIVE STATEMENT: Pt.  Referred to PT with chronic h/o neck/thoracic/lumbar spine pain.  Pt. Currently on disability since 2020 secondary to back pain.  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  Pt. Has been living at [facility] in [place] for past 1.5 years of sober living.  Pt. Has 4 stairs to enter/exit house.  Pt. Received an injection in back from Frank Terry but pain returned in <1 week.  Pt. Currently ambulates with SPC and limited with walking distance.  Pt. Was able to walk 5 blocks in [place] in past but currently limited by pain/ deconditioning.  Pt. Not driving and hoping to join [employer] with [employer] membership.  Pt. Has received PT in past with Frank Terry at [facility] s/p prostate surgery.    PERTINENT HISTORY:  History of present illness (HPI) Frank Terry, a 59 y.o. year old male, is here today because of his Radicular pain of thoracic region [M54.14]. Frank Terry's primary complain today is Back Pain   Pertinent problems: Frank Terry has Asthma, chronic; Prostate cancer (HCC); Gastric ulcer; Terry pain, bilateral; Compression fracture of spine (HCC); Spondylosis of lumbar region without myelopathy or radiculopathy; Congenital absence of left kidney; Radicular pain of thoracic region; Foraminal stenosis of thoracic region; Thoracic facet joint syndrome; History of substance abuse (HCC); and Chronic pain syndrome on their pertinent problem list.   Pain Assessment: Severity of Chronic pain is reported as a 4 /10. Location: Back Left, Mid, Lower/Radiates into abdomen. Onset: More than a month ago. Quality: Sharp, Aching, Constant. Timing: Constant. Modifying factor(s): Denies. Vitals:  height is 5' 9 (1.753 m) and weight is 215 lb (97.5 kg). His temporal temperature is 97.2 F (36.2 C) (abnormal). His blood pressure is 145/87 (abnormal) and his pulse is 109 (abnormal). His respiration is 20 and oxygen saturation is 100%.  BMI: Estimated body mass index is 31.75 kg/m as calculated from the  following:   Height as of this encounter: 5' 9 (1.753 m).   Weight as of this encounter: 215 lb (  97.5 kg).   Last encounter: Visit date not found. Last procedure: Visit date not found.   Reason for encounter: post-procedure evaluation and assessment.    Discussed the use of AI scribe software for clinical note transcription with the patient, who gave verbal consent to proceed.   History of Present Illness   He is a 59 year old male who presents with persistent thoracic pain following a thoracic epidural steroid injection.   He underwent a thoracic epidural steroid injection on October 08, 2024. Initially, he experienced complete pain relief immediately after the procedure, but the pain returned to a level of six out of ten within four to six hours. He reported approximately 50% pain relief for the first week post-procedure, but no relief thereafter.   Prior to the procedure, his pain level was consistently around six out of ten, worsening throughout the day from a two or three in the morning to a six by noon. The pain is described as a shooting pain across the abdomen, radiating from the back around to the front. Previously, lying down provided relief, but now it does not, and he has experienced several sleepless nights due to discomfort.   He takes Advil  and Tylenol , typically once or twice a day, to manage the pain. He has also used ice and heat therapy post-procedure, applying ice for six to eight hours on the day of the procedure and heat the following day.   He denied engaging in any work or activities that could have caused injury. Bending or stretching does not significantly increase his pain. However, activities such as standing or sitting upright for extended periods, or bending slightly while performing tasks, exacerbate his symptoms.   He has no history of diabetes, and his glucose levels have been normal in the past. He recalls having his sugar levels checked by his primary care  doctor, but cannot remember the exact timing of the last test.     Procedure Type:  Inter-Laminar Thoracic Epidural Steroid Block/Injection  #1  Laterality: Left Level: T10-11  DOS: 10/08/2024 Performed by: Wallie Sherry, MD Imaging: Fluoroscopic guidance Anesthesia: Local anesthesia (1-2% Lidocaine ) Purpose: Diagnostic/Therapeutic Indications: Thoracic back pain, radicular pain, with degenerative disc disease severe enough to impact quality of life or function. 1. Radicular pain of thoracic region   2. Foraminal stenosis of thoracic region   3. Compression fracture of thoracic vertebra, unspecified thoracic vertebral level, sequela     NAS-11 Pain score:        Pre-procedure: 4 /10        Post-procedure: 4 /10    Post-Procedure Evaluation    Effectiveness:  Initial hour after procedure: 100 % . Subsequent 4-6 hours post-procedure: 100 % . Analgesia past initial 6 hours: 50 % . Ongoing improvement:  Analgesic:  50% (approximately 7 days, and then pain return to baseline) Function: Somewhat improved ROM: Somewhat improved Interpretation: Mr. Ashdon underwent a diagnostic/therapeutic Inter-Laminar Thoracic Epidural Steroid Block/Injection on October 08, 2024.  He reports initially 100% pain relief and functional improvement during local anesthetic phase, followed by sustained ongoing 50% pain relief and functional improvement approximately 7 days and then pain returned to baseline.   PAIN:  Are you having pain? Yes: NPRS scale: 5/10 Pain location: cervical/thoracic/lumbar spine Pain description: sharp in L thoracic spine/ribs/ radiating pain Aggravating factors: prolonged standing/ increase movement Relieving factors: short-term relief with injection  PRECAUTIONS: None  RED FLAGS: None   WEIGHT BEARING RESTRICTIONS: No  FALLS:  Has patient fallen in last  6 months? No  LIVING ENVIRONMENT: Lives with: lives in an adult home Lives in: House/apartment Stairs: Yes:  External: 4 steps; none Has following equipment at home: Single point cane  OCCUPATION: On disability  PLOF: Independent  PATIENT GOALS: Decrease neck/back pain and increase overall conditioning to improve walking endurance/ functional mobility.    NEXT MD VISIT: 11/05/24  OBJECTIVE:  Note: Objective measures were completed at Evaluation unless otherwise noted.  DIAGNOSTIC FINDINGS:  EXAM: MRI CERVICAL SPINE WITHOUT CONTRAST 08/12/2024 07:42:00 AM   TECHNIQUE: Multiplanar multisequence MRI of the cervical spine was performed.   COMPARISON: CT of the cervical spine dated 07/30/2024.   CLINICAL HISTORY: Abnormal MRI. Weakness in limbs with pain in neck. HA's. Numb/ting in all extremities for 6 months.   FINDINGS:   BONES AND ALIGNMENT: Normal alignment. Normal vertebral body heights. Bone marrow signal is unremarkable.   SPINAL CORD: Normal spinal cord size. No abnormal spinal cord signal. No apparent impingement of the spinal cord.   SOFT TISSUES: No paraspinal mass.   C2-C3: No significant disc herniation. No spinal canal stenosis or neural foraminal narrowing.   C3-C4: Minimal disc bulging and mild bilateral uncovertebral joint hypertrophy with mild central spinal canal stenosis. The neural foramina are patent.   C4-C5: No significant disc herniation. No spinal canal stenosis or neural foraminal narrowing.   C5-C6: Broad-based disc bulge and endplate ridging with mild central spinal canal stenosis and mild right neural foraminal stenosis.   C6-C7: Diffuse disc bulging and left-sided uncovertebral joint hypertrophy, with mild left spinal canal and left neural foraminal stenosis.   C7-T1: No significant disc herniation. No spinal canal stenosis or neural foraminal narrowing.   IMPRESSION: 1. Mild central spinal canal stenosis at C3-4, C5-6, and left C6-7. 2. Mild right neural foraminal stenosis at C5-6 and mild left neural foraminal stenosis at  C6-7. 3. No apparent impingement of the spinal cord or nerve roots.   Electronically signed by: Evalene Coho MD 08/12/2024 08:16 AM  EXAM: MRI THORACIC SPINE WITHOUT CONTRAST   TECHNIQUE: Multiplanar, multisequence MR imaging of the thoracic spine was performed. No intravenous contrast was administered.   COMPARISON:  CT cervical spine 07/30/2024. CTA chest 03/20/2024. CT Abdomen and Pelvis 07/14/2023.   FINDINGS: Limited cervical spine imaging: Largely negative for age, concordant with cervical spine CT earlier this month.   Thoracic spine segmentation:  Normal on previous CTs.   Alignment: Chronic exaggerated lower thoracic kyphosis in conjunction with chronic lower thoracic compression fractures, stable from CT last year. No significant thoracic scoliosis or spondylolisthesis.   Vertebrae: Chronic superior endplate compression fractures of T10, T11 and T12 are stable since last year. No associated marrow edema. Normal background bone marrow signal. Small chronic sclerotic focus superimposed in the right T10 vertebral body also stable by CT since last year and compatible with benign etiology, probably bone island. Stable, maintained thoracic vertebral height elsewhere. No marrow edema or evidence of acute osseous abnormality.   Cord: Normal. Capacious thoracic spinal canal at most levels, see details below. Conus medullaris mostly visible at T12-L1 and appears to be normal.   Paraspinal and other soft tissues: Stable, negative.   Disc levels:   No age advanced thoracic spine degeneration   T1-T2 through T7-T8.   T8-T9: Circumferential disc bulge with mild to moderate facet and ligament flavum hypertrophy. No spinal stenosis. Mild left and moderate to severe right T8 neural foraminal stenosis.   T9-T10: Negative disc. Moderate to severe facet hypertrophy. Mild left and moderate to  severe right T9 foraminal stenosis.   T10-T11: Minor disc bulging. Moderate  facet and ligament flavum hypertrophy. Mild T10 foraminal stenosis.   T11-T12: Disc osteophyte complex in part related to chronic retropulsion of the posterosuperior T12 endplate, stable from last year. Moderate posterior element hypertrophy. Borderline to mild spinal stenosis. No cord mass effect. Mild bilateral T11 foraminal stenosis.   IMPRESSION: 1. No acute or suspicious osseous abnormality in the thoracic spine. Chronic T10, T11, and T12 compression fractures are stable from last year. 2. Mild for age thoracic spine degeneration above T8-T9. Mild chronic T12 retropulsion contributes to borderline or mild spinal stenosis at T11-T12. Lower thoracic facet arthropathy results in moderate to severe neural foraminal stenosis at the right T8 and T9 nerve levels.   Electronically Signed   By: VEAR Hurst M.D.   On: 08/08/2024 10:57  MRI LUMBAR SPINE 07/01/2024 02:37:10 PM   TECHNIQUE: Multiplanar multisequence MRI of the lumbar spine was performed without the administration of intravenous contrast.   COMPARISON: MRI of the lumbar spine dated 04/04/2023.   CLINICAL HISTORY: Low back pain, cauda equina syndrome suspected. Pt reports lower back pain that radiates to his abdomen and is now causing him to leak urine. Pt has prostate hx and chronic back pain but the incontinence is new.   FINDINGS:   BONES AND ALIGNMENT: Moderate chronic compression deformity of T12, unchanged from prior study. The vertebral body has lost about 50% of its height anteriorly and there is a chronic schmorl's node within the superior endplate. There is chronic downward bowing of the superior endplates of L1 and L2, as before.   SPINAL CORD: The conus medullaris terminates at L1.   SOFT TISSUES: No paraspinal mass.   L1-L2: No significant disc herniation. No spinal canal stenosis or neural foraminal narrowing.   L2-L3: Mild diffuse disc bulging with mild bilateral lateral recess stenosis.    L3-L4: Slight disc bulging without significant spinal canal or neural foraminal stenosis.   L4-L5: No significant disc herniation. No spinal canal stenosis or neural foraminal narrowing.   L5-S1: Slight disc bulging without significant spinal canal or neural foraminal stenosis.   OTHER FINDINGS: The left kidney is not visualized.   IMPRESSION: 1. No evidence of cauda equina syndrome. 2. Moderate chronic compression deformity of T12, unchanged. 3. Chronic downward bowing of the superior endplates of L1 and L2, as before. 4. Mild diffuse disc bulging at L2-3 with mild bilateral lateral recess stenosis. 5. Slight disc bulging at L3-4 and L5-S1 without significant spinal canal or neural foraminal stenosis.  PATIENT SURVEYS:  MODI: 68% self-perceived severe disability  COGNITION: Overall cognitive status: Within functional limits for tasks assessed     SENSATION: Light touch: Impaired   MUSCLE LENGTH: Hamstrings: Right 48 deg; Left 33 deg Thomas test: NT  POSTURE: rounded shoulders and forward head  PALPATION: Moderate tenderness in B lumbar paraspinals/ SI region.  Pt. Reports B LE radicular symptoms with prolonged standing.    LUMBAR ROM:  Flexion: limited 25%, extension: limited 50%, L/R rotn. Limited 25% and L/R lateral flexion WFL (L sided rib pain).  5/10 L thoracic/ rib pain.  Mid-thoracic/ lumbar hypomobility with assessment of central/unilateral mobs.    CERVICAL ROM:      Flexion: 41 deg./ extension 35 deg./ L rotn. 44 deg./ R rotn. 48 deg./ R lat. Flexion 40 deg./ L lateral flexion.   B shoulder AROM WFL   LOWER EXTREMITY MMT:    MMT Right eval Left eval  Hip flexion  4+ 4+  Hip extension    Hip abduction 4+ 4+  Hip adduction    Hip internal rotation 5 5  Hip external rotation 5 5  Terry flexion 5 5  Terry extension 5 5  Ankle dorsiflexion    Ankle plantarflexion    Ankle inversion    Ankle eversion     (Blank rows = not tested)  LUMBAR SPECIAL  TESTS:  (+) SLR on L.  FUNCTIONAL TESTS:  5xSTS: 26.6 sec  GAIT: Distance walked: in clinic Assistive device utilized: Single point cane Level of assistance: Modified independence Comments: antalgic gait with increase c/o back pain with distance walked.    5xSTS completed:  -26.6 sec  TREATMENT DATE: 12/10/2024        SUBJECTIVE STATEMENT:   Pt feeling just ok today. Pt reports being in 4/10 back pain. Pt also reports having pain in the left and right thoracic region that radiates to abdominal region/ ribs.  Pt reports that after his recent fall with scooter, he has been having more pain in the R side/ ribs.   Pt has not been able to go to the gym due to being busy and cold weather.  Pt had MD f/u with Frank Terry and is scheduled for injection on 12/22/24.   Therapeutic Exercise: - for improved soft tissue flexibility and extensibility as needed for ROM, improved strength as needed to improve performance of CKC activities/functional movements  NuStep L5 for 10 min. B UE/LE to improve soft tissue mobility and increased tissue temperature to improve muscle performance.     Nautilus: Seated Lat pull downs 50# 1x10 second set 60#. Standing Retraction 2x12 40#. Second set 50#. Pallof press 2x15x. 50# on L/R with cuing for proper technique. Right side facing caused increased pain.  Resisted lateral walking 80# L/R 1 lap to end of blue line, cuing for slow even steps and good upward posture. Both sides increased pain Left facing nautilus more painful.  Standing extension and trunk rotation x 10. Feels more flexible going left in rotation.  Standing hip extension with support 2x10.  Manual therapy:  for pain modulation  Supine LE/lumbar stretches (generalized)- focus on hamstring/ piriformis/ rotn. Stretches.    Prone STM to thoracic/lumbar paraspinals.  Grade III PA unilateral/central mobs from mid-thoracic to lumbar spine.  Prone R/L rib springing to assess mobility (+) R mid-thoracic  hypomobility noted in mid-back.    STM and IASTM with [employer] along T7-T12 thoracic paraspinals, bilateral lumbar erector spinae in sitting; x 12 minutes.   PATIENT EDUCATION:  Education details: Spine hypomobility/ functional Nurse, Children's Person educated: Patient Education method: Explanation Education comprehension: verbalized understanding  HOME EXERCISE PROGRAM: Access Code: [email or web address] URL: [email or web address] Date: 11/06/2024 Prepared by: Venetia Endo  Exercises - Static Prone on Elbows  - 3-4 x daily - 7 x weekly - 1 sets - 3-4 min hold - Hooklying Lumbar Traction  - 7 x weekly - 10 reps - 5-10sec hold - Supine Lower Trunk Rotation  - 2 x daily - 7 x weekly - 2 sets - 10 reps - Supine Bridge  - 2 x daily - 7 x weekly - 2 sets - 10 reps - Seated Scapular Retraction  - 2 x daily - 7 x weekly - 2 sets - 10 reps - 3sec hold  Access Code: [email or web address] URL: [email or web address] Date: 11/19/2024 Prepared by: Ozell Sero  Exercises - Static Prone on Elbows  - 3-4 x daily - 7 x weekly - 1 sets -  3-4 min hold - Hooklying Lumbar Traction  - 7 x weekly - 10 reps - 5-10sec hold - Supine Lower Trunk Rotation  - 2 x daily - 7 x weekly - 2 sets - 10 reps - Supine Bridge  - 2 x daily - 7 x weekly - 2 sets - 10 reps - Scapular Retraction with Resistance  - 1 x daily - 4 x weekly - 2 sets - 15 reps - Shoulder W - External Rotation with Resistance  - 1 x daily - 4 x weekly - 2 sets - 15 reps   ASSESSMENT:  CLINICAL IMPRESSION: Pt is feeling more back pain today both before and during session. Continues to feel pain in L abdominal region and is starting to feel pain in R abdominal region as well. Pt had more pain today with paloff press as well as resisted lateral walking. Pt did well with lat pull downs which did not increase his pain. Pt felt good after manual therapy on spine. Patient will benefit from continued skilled PT services to increase  spinal mobility/ generalized strengthening and conditioning to progress to [employer] ex. Program/ walking program.   OBJECTIVE IMPAIRMENTS: Abnormal gait, decreased activity tolerance, decreased balance, decreased coordination, decreased endurance, decreased mobility, difficulty walking, decreased ROM, decreased strength, hypomobility, impaired flexibility, improper body mechanics, postural dysfunction, and pain.   ACTIVITY LIMITATIONS: carrying, lifting, bending, standing, squatting, and locomotion level  PARTICIPATION LIMITATIONS: cleaning and community activity  PERSONAL FACTORS: Fitness and Past/current experiences are also affecting patient's functional outcome.   REHAB POTENTIAL: Good  CLINICAL DECISION MAKING: Evolving/moderate complexity  EVALUATION COMPLEXITY: Moderate   GOALS: Goals reviewed with patient? Yes  SHORT TERM GOALS: Target date: 11/24/24  Pt. Will be independent with HEP to increase B hip strength 1/2 muscle grade to improve walking/ stair climbing.   Baseline:  see above Goal status: Partially met  2.  Pt. Able to complete 30 minutes of exercise with no increase c/o back pain or radicular symptoms to improve pain-free mobility.  Baseline:  5/10 back pain at rest and increase pain/ radicular symptoms with activity Goal status: Partially met  LONG TERM GOALS: Target date: 12/15/24  Pt. Will decrease MODI to <50% to improve self-perceived pain-free mobility. Baseline: 68% self-perceived severe disability Goal status: INITIAL  2.  Pt. Will complete 5xSTS and decrease time by 3 seconds to improve pain-free mobility.  Baseline: 26.6 sec  Goal status: INITIAL  3.  Pt. Able to walk 5 blocks from house to regular meetings with no increase c/o back pain/ improved endurance.   Baseline: limited walking distance due to moderate back pain.   Goal status: INITIAL  4.  Pt. Will join/ participate with exercise program at [facility] with no increase c/o back  pain.   Baseline: currently not exercising Goal status: INITIAL  PLAN:  PT FREQUENCY: 2x/week  PT DURATION: 6 weeks  PLANNED INTERVENTIONS: 97110-Therapeutic exercises, 97530- Therapeutic activity, [id number]- Neuromuscular re-education, 97535- Self Care, [id number]- Manual therapy, [phone]- Gait training, [phone]- Electrical stimulation (unattended), and Patient/Family education.  PLAN FOR NEXT SESSION: ISSUE new HEP with [employer]/ check MODI and update all goals.    Ozell JAYSON Sero, PT, DPT # [id number] [place] Gainer, SPT 12/10/2024, 12:31 PM  "

## 2024-12-11 ENCOUNTER — Encounter: Payer: Self-pay | Admitting: Orthopedic Surgery

## 2024-12-11 NOTE — Telephone Encounter (Signed)
 Lumbar spine xrays dated 12/05/24:  FINDINGS: There is moderate spondylosis of the lumbar spine to include moderate facet arthropathy over the lower lumbar spine. Mild to moderate which compression fracture of L1 unchanged. Minimal loss of vertebral body height of L2 unchanged. Moderate T12 which compression fracture unchanged. Very minimal loss of anterior vertebral body height of T10 and T11 unchanged. No acute compression fracture. No spondylolisthesis. Mild disc space narrowing at the L2-3 level and to lesser extent at the L1-2 level. Remainder the exam is unchanged.   IMPRESSION: 1. No acute findings. 2. 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 3. Stable compression fractures of T10, T11, T12 and L1.     Electronically Signed   By: Toribio Agreste M.D.   On: 12/10/2024 10:47       He does have increased kyphosis above his previous fractures at T10-L1.   Reviewed with Dr. Claudene. He recommends patient proceed with thoracic facet injections, but he wants to see him in clinic to discuss further options including possible surgery.   Message sent to patient.

## 2024-12-15 ENCOUNTER — Ambulatory Visit: Admitting: Physical Therapy

## 2024-12-17 ENCOUNTER — Ambulatory Visit: Admitting: Physical Therapy

## 2024-12-17 ENCOUNTER — Encounter: Payer: Self-pay | Admitting: Physical Therapy

## 2024-12-17 DIAGNOSIS — M5459 Other low back pain: Secondary | ICD-10-CM | POA: Diagnosis not present

## 2024-12-17 DIAGNOSIS — G8929 Other chronic pain: Secondary | ICD-10-CM

## 2024-12-17 DIAGNOSIS — M542 Cervicalgia: Secondary | ICD-10-CM

## 2024-12-17 DIAGNOSIS — R5381 Other malaise: Secondary | ICD-10-CM

## 2024-12-17 DIAGNOSIS — M6281 Muscle weakness (generalized): Secondary | ICD-10-CM

## 2024-12-17 DIAGNOSIS — M2560 Stiffness of unspecified joint, not elsewhere classified: Secondary | ICD-10-CM
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
## 2024-12-18 NOTE — Progress Notes (Unsigned)
 "  Referring Physician:  Theophilus Pagan, MD [address] [place],  [place] [address]  Primary Physician:  Theophilus Pagan, MD  History of Present Illness: 12/29/2024  Discuss Thoracic surgery  ESI injection 12/22/2024 Discharged from PT at [facility]   History of Present Illness: 12/01/2024 Note from [facility], [place] Mr. Hadriel Northup has a history of HTN, gastric ulcer, osteoporosis, compression fracture T12, prostate CA, gout, B12 deficiency, asthma, ETOH abuse, history of chronic steroid use.   He has been sober from ETOH for almost 2 years!  He only has 1 kidney.   Did video visit with me 08/22/24 to review his cervical MRI. He has mild central stenosis C3-C7 with mild right foraminal stenosis C5-C6 and mild left foraminal stenosis C6-C7. No significant cord compression.   He has mild central and bilateral foraminal stenosis T11-T12, mild left and moderate/severe right foraminal stenosis T8-T9, mild bilateral foraminal stenosis T10-T11, and chronic compression fractures at T10-T12.   He has known lumbar spondylosis with mild bilateral lateral recess stenosis L2-L3.   He was sent to PT for his cervical/thoracic/lumbar spine at last visit. He was to follow up with ENT (per Dr. Leigh regarding his balance/dexterity issues. He was sent to pain management to consider injections.   He did initial PT evaluation on 11/03/24 and has done 4 more visits through 11/28/24.   He had left T10-T11 IL ESI on 10/08/24 with improvement for about a week. He had great relief immediately, but it only lasted 3-4 days.   He is here for follow up.   His primary complaint continues to be constant mid to lower thoracic pain with some radiation into his ribs on left. This is worse with prolonged standing and walking. He can't cook a meal without stopping due to pain. Some relief with laying down.   His neck and lower back pain is still present, but has improved with PT. This pain is more tolerable.   He still  has constant neck pain with constant numbness, tingling, and weakness in both arms. He has intermittent bilateral arm pain mostly to elbow, but sometimes to his wrist. Pain is worse with lifting and using his arms.    He also has intermittent LBP with intermittent pain in bilateral anterior thigh pain and lateral hips. Occasional pain past his knees. He continues with numbness, tingling, and weakness. Pain is worse with prolonged standing/sitting/walking. Some relief with laying down.   He continues with balance/dexterity issues and an unsteady gait. He saw ENT- offered hearing aids for tinnitus.   Tobacco use:  Does not smoke. He does nicotine  pouches- he does 4 a day.   Bowel/Bladder Dysfunction: had prostate removed last July- he's had some urinary leaking and urgency since that time. No issues with his bowels.   Conservative measures:  Physical therapy: initial PT evaluation on 11/03/24 and has done 4 more visits through 11/28/24.  Multimodal medical therapy including regular antiinflammatories: tylenol , flexeril  Injections:  Left T10-T11 IL ESI 09/28/24 no epidural steroid injections for his neck, he has had some for his back in the early 2000s and in 2021.    Past Surgery: no spinal surgeries  [name] has dexterity issues. He thinks his balance is getting worse.   The symptoms are causing a significant impact on the patient's life.   Review of Systems:  A 10 point review of systems is negative, except for the pertinent positives and negatives detailed in the HPI.  Past Medical History: Past Medical History:  Diagnosis Date   Alcohol use disorder    pt currently lives at the [facility]   Allergy    Anxiety    Arthritis    Asthma    well controlled   Compression fracture of spine (HCC)    Congenital absence of one kidney    pt has right kidney   Depression    Gastric ulcer    GERD (gastroesophageal reflux disease)    no meds   Gout    HTN (hypertension)     Hyponatremia    Osteoporosis    Prostate cancer (HCC)    Ulcer    Vasitis 05/2023    Past Surgical History: Past Surgical History:  Procedure Laterality Date   COLONOSCOPY WITH ESOPHAGOGASTRODUODENOSCOPY (EGD)     EXCISION MASS HEAD Left 10/29/2024   Procedure: Facial mass excision;  Surgeon: Anice Riis, DO;  Location: [facility];  Service: ENT;  Laterality: Left;   MASS EXCISION Left 09/19/2023   Procedure: EXCISION MASS, deep soft tissue mass back;  Surgeon: Lane Shope, MD;  Location: [facility];  Service: General;  Laterality: Left;   PELVIC LYMPH NODE DISSECTION Bilateral 06/18/2023   Procedure: PELVIC LYMPH NODE DISSECTION;  Surgeon: Riis Knee, MD;  Location: [facility];  Service: Urology;  Laterality: Bilateral;   PROSTATE BIOPSY     ROBOT ASSISTED LAPAROSCOPIC RADICAL PROSTATECTOMY N/A 06/18/2023   Procedure: XI ROBOTIC ASSISTED LAPAROSCOPIC RADICAL PROSTATECTOMY;  Surgeon: Riis Knee, MD;  Location: [facility];  Service: Urology;  Laterality: N/A;   TONSILLECTOMY     age 59    Allergies: Allergies as of 12/01/2024 - Review Complete 12/01/2024  Allergen Reaction Noted   Penicillins Rash 05/07/2022    Medications: Outpatient Encounter Medications as of 12/01/2024  Medication Sig   acetaminophen  (TYLENOL ) 500 MG tablet Take 1,000 mg by mouth every 6 (six) hours as needed.   albuterol  (VENTOLIN  HFA) 108 (90 Base) MCG/ACT inhaler Inhale 2 puffs into the lungs every 6 (six) hours as needed for wheezing or shortness of breath.   cetirizine (ZYRTEC) 10 MG tablet Take 10 mg by mouth daily. Unsure of dose   colchicine  0.6 MG tablet Take 0.6 mg by mouth as needed.   irbesartan  (AVAPRO ) 150 MG tablet Take 1 tablet (150 mg total) by mouth daily.   nortriptyline  (PAMELOR ) 25 MG capsule Take 2 capsules (50 mg total) by mouth at bedtime.   omeprazole (PRILOSEC OTC) 20 MG tablet Take 20 mg by mouth daily.   rizatriptan  (MAXALT ) 10 MG tablet Take 1 tablet (10  mg total) by mouth as needed for migraine. May repeat in 2 hours if needed   [DISCONTINUED] fluticasone -salmeterol (ADVAIR HFA) 230-21 MCG/ACT inhaler Inhale 2 puffs into the lungs 2 (two) times daily.   No facility-administered encounter medications on file as of 12/01/2024.    Social History: Social History   Tobacco Use   Smoking status: Former    Types: Cigarettes    Start date: 1984   Smokeless tobacco: Current   Tobacco comments:    Nicotine  patches   Vaping Use   Vaping status: Never Used  Substance Use Topics   Alcohol use: Not Currently    Alcohol/week: 168.0 standard drinks of alcohol    Types: 168 Cans of beer per week    Comment: pt lives in the [facility] for alcohol addiction   Drug use: Not Currently    Family Medical History: Family History  Problem Relation Age of Onset   Hypertension  Mother    Lung cancer Mother    Osteoporosis Mother    Alcohol abuse Mother    Arthritis Mother    Cancer Mother    Heart disease Mother    Hypertension Father    Cancer Father        Abdominal   Heart attack Father    Hypertension Sister    Multiple sclerosis Sister     Physical Examination: Vitals:   12/01/24 1139  BP: 134/84    Awake, alert, oriented to person, place, and time.  Speech is clear and fluent. Fund of knowledge is appropriate.   Cranial Nerves: Pupils equal round and reactive to light.  Facial tone is symmetric.    No abnormal lesions on exposed skin.   Strength: Side Biceps Triceps Deltoid Interossei Grip Wrist Ext. Wrist Flex.  R 5 5 5 5 5 5 5   L 5 5 5 5 5 5 5    Side Iliopsoas Quads Hamstring PF DF EHL  R 5 5 5 5 5 5   L 5 5 5 5 5 5    Reflexes are 2+ and symmetric at the biceps, brachioradialis, and achilles, absent at right achilles. 3+ at the patella.      Hoffman's is absent.  Clonus is not present.    Bilateral upper and lower extremity sensation is intact to light touch.     No pain with IR/ER of both hips.   He has slow  gait.   Medical Decision Making  Imaging: None   Assessment and Plan: Mr. Roberti's primary complaint continues to be constant mid to lower thoracic pain with some radiation into his ribs on left. He can't cook a meal without stopping due to pain.   He has mild central and bilateral foraminal stenosis T11-T12, mild left and moderate/severe right foraminal stenosis T8-T9, mild bilateral foraminal stenosis T10-T11, and chronic compression fractures at T10-T12.    His neck and lower back pain is still present, but has improved with PT. This pain is more tolerable.   He still has constant neck pain with constant numbness, tingling, and weakness in both arms. He has intermittent bilateral arm pain mostly to elbow, but sometimes to his wrist.   He has mild central stenosis C3-C7 with mild right foraminal stenosis C5-C6 and mild left foraminal stenosis C6-C7. No significant cord compression.    He also has intermittent LBP with intermittent pain in bilateral anterior thigh pain and lateral hips. Occasional pain past his knees.    He has known lumbar spondylosis with mild bilateral lateral recess stenosis L2-L3.    Treatment options discussed with patient and following plan made:   - Keep follow up with Dr. Lateef as he may be candidate for repeat injections.  - Will review thoracic imaging with Dr. Claudene to see if he is candidate for any surgery, may be candidate for SCS?  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  - Continue with PT for cervical and lumbar spine.  - Follow up with Dr. Leigh as scheduled (neurology).  - Will message him after I hear back from Dr. Claudene and will determine follow up at that time.    I spent a total of 25 minutes in face-to-face and non-face-to-face activities related to this patient's care today including review of outside records, review of imaging, review of symptoms, physical exam, discussion of differential diagnosis, discussion of treatment options,  and documentation.   ADDENDUM 12/02/24:  Patient discussed with Dr. Claudene. May consider  thoracic facet injections to see if he is getting referred pain from previous fracture. Will send message to Dr. Marcelino. He has follow up with him on 12/09/24.   Would also consider getting standing thoracic flex/ext xrays to see if he has any instability at fracture line.   Message sent to patient.   Gl[facility] Boys PA-C Dept. of Neurosurgery "

## 2024-12-22 ENCOUNTER — Ambulatory Visit
Admission: RE | Admit: 2024-12-22 | Discharge: 2024-12-22 | Disposition: A | Source: Ambulatory Visit | Attending: Student in an Organized Health Care Education/Training Program | Admitting: Student in an Organized Health Care Education/Training Program

## 2024-12-22 ENCOUNTER — Ambulatory Visit: Admitting: Student in an Organized Health Care Education/Training Program

## 2024-12-22 VITALS — BP 161/106 | HR 93 | Temp 97.3°F | Resp 16 | Ht 69.0 in | Wt 210.0 lb

## 2024-12-22 DIAGNOSIS — M47894 Other spondylosis, thoracic region: Secondary | ICD-10-CM

## 2024-12-22 DIAGNOSIS — S22000S Wedge compression fracture of unspecified thoracic vertebra, sequela: Secondary | ICD-10-CM

## 2024-12-22 DIAGNOSIS — G894 Chronic pain syndrome: Secondary | ICD-10-CM

## 2024-12-22 DIAGNOSIS — M5412 Radiculopathy, cervical region: Secondary | ICD-10-CM

## 2024-12-22 MED ORDER — LIDOCAINE HCL (PF) 2 % IJ SOLN
INTRAMUSCULAR | Status: AC
  Filled 2024-12-22: qty 10

## 2024-12-22 MED ORDER — IOHEXOL 180 MG/ML  SOLN
INTRAMUSCULAR | Status: AC
  Filled 2024-12-22: qty 20

## 2024-12-22 MED ORDER — ROPIVACAINE HCL 2 MG/ML IJ SOLN
9.0000 mL | Freq: Once | INTRAMUSCULAR | Status: AC
  Administered 2024-12-22: 9 mL via PERINEURAL

## 2024-12-22 MED ORDER — FENTANYL CITRATE (PF) 100 MCG/2ML IJ SOLN: 25.0000 ug | INTRAMUSCULAR | Status: DC | PRN

## 2024-12-22 MED ORDER — LIDOCAINE HCL 2 % IJ SOLN
20.0000 mL | Freq: Once | INTRAMUSCULAR | Status: AC
  Administered 2024-12-22: 200 mg

## 2024-12-22 MED ORDER — MIDAZOLAM HCL 2 MG/2ML IJ SOLN
INTRAMUSCULAR | Status: AC
  Filled 2024-12-22: qty 2

## 2024-12-22 MED ORDER — FENTANYL CITRATE (PF) 100 MCG/2ML IJ SOLN
INTRAMUSCULAR | Status: AC
  Filled 2024-12-22: qty 2

## 2024-12-22 MED ORDER — ROPIVACAINE HCL 2 MG/ML IJ SOLN
1.0000 mL | Freq: Once | INTRAMUSCULAR | Status: AC
  Administered 2024-12-22: 1 mL via EPIDURAL

## 2024-12-22 MED ORDER — SODIUM CHLORIDE 0.9% FLUSH
1.0000 mL | Freq: Once | INTRAVENOUS | Status: AC
  Administered 2024-12-22: 1 mL

## 2024-12-22 MED ORDER — LACTATED RINGERS IV SOLN: Freq: Once | INTRAVENOUS | Status: DC

## 2024-12-22 MED ORDER — SODIUM CHLORIDE (PF) 0.9 % IJ SOLN
INTRAMUSCULAR | Status: AC
  Filled 2024-12-22: qty 10

## 2024-12-22 MED ORDER — MIDAZOLAM HCL (PF) 2 MG/2ML IJ SOLN: 0.5000 mg | Freq: Once | INTRAMUSCULAR | Status: DC

## 2024-12-22 MED ORDER — DEXAMETHASONE SOD PHOSPHATE PF 10 MG/ML IJ SOLN
10.0000 mg | Freq: Once | INTRAMUSCULAR | Status: AC
  Administered 2024-12-22: 10 mg

## 2024-12-22 MED ORDER — IOHEXOL 180 MG/ML  SOLN
10.0000 mL | Freq: Once | INTRAMUSCULAR | Status: AC
  Administered 2024-12-22: 10 mL via EPIDURAL

## 2024-12-22 MED ORDER — DEXAMETHASONE SOD PHOSPHATE PF 10 MG/ML IJ SOLN
INTRAMUSCULAR | Status: AC
  Filled 2024-12-22: qty 3

## 2024-12-22 MED ORDER — ROPIVACAINE HCL 2 MG/ML IJ SOLN
INTRAMUSCULAR | Status: AC
  Filled 2024-12-22: qty 20

## 2024-12-22 MED ORDER — DEXAMETHASONE SOD PHOSPHATE PF 10 MG/ML IJ SOLN
INTRAMUSCULAR | Status: AC
  Filled 2024-12-22: qty 1

## 2024-12-22 NOTE — Progress Notes (Signed)
 Safety precautions to be maintained throughout the outpatient stay will include: orient to surroundings, keep bed in low position, maintain call bell within reach at all times, provide assistance with transfer out of bed and ambulation.   Has been out of BP meds for a couple of days

## 2024-12-22 NOTE — Patient Instructions (Signed)

## 2024-12-23 ENCOUNTER — Telehealth: Payer: Self-pay

## 2024-12-23 ENCOUNTER — Other Ambulatory Visit: Payer: Self-pay | Admitting: Family Medicine

## 2024-12-23 ENCOUNTER — Telehealth: Admitting: Physician Assistant

## 2024-12-23 DIAGNOSIS — I10 Essential (primary) hypertension: Secondary | ICD-10-CM

## 2024-12-23 MED ORDER — IRBESARTAN 150 MG PO TABS: 150.0000 mg | ORAL_TABLET | Freq: Every day | ORAL | 0 refills | Status: AC

## 2024-12-23 NOTE — Telephone Encounter (Signed)
Post procedure follow up.  Patient states he is doing well 

## 2024-12-24 ENCOUNTER — Ambulatory Visit: Admitting: Physical Therapy

## 2024-12-24 DIAGNOSIS — M5459 Other low back pain: Secondary | ICD-10-CM

## 2024-12-24 DIAGNOSIS — R5381 Other malaise: Secondary | ICD-10-CM

## 2024-12-24 DIAGNOSIS — M542 Cervicalgia: Secondary | ICD-10-CM

## 2024-12-24 DIAGNOSIS — G8929 Other chronic pain: Secondary | ICD-10-CM

## 2024-12-24 DIAGNOSIS — M2560 Stiffness of unspecified joint, not elsewhere classified: Secondary | ICD-10-CM

## 2024-12-24 DIAGNOSIS — M6281 Muscle weakness (generalized): Secondary | ICD-10-CM

## 2024-12-24 NOTE — Therapy (Signed)
 + OUTPATIENT PHYSICAL THERAPY THORACOLUMBAR TREATMENT/ DISCHARGE Physical Therapy Progress Note  Dates of reporting period  11/03/24   to   12/24/24   Patient Name: Frank Terry MRN: [id number] DOB:1966-05-25, 59 y.o., male Today's Date: 12/24/2024  END OF SESSION:  PT End of Session - 12/24/24 0952     Visit Number 10    Number of Visits 17    Date for Recertification  01/14/25    PT Start Time 0950    PT Stop Time 1036    PT Time Calculation (min) 46 min    Activity Tolerance Patient tolerated treatment well;No increased pain    Behavior During Therapy WFL for tasks assessed/performed          Past Medical History:  Diagnosis Date   Alcohol use disorder    pt currently lives at the [facility]   Allergy    Anxiety    Arthritis    Asthma    well controlled   Compression fracture of spine (HCC)    Congenital absence of one kidney    pt has right kidney   Depression    Gastric ulcer    GERD (gastroesophageal reflux disease)    no meds   Gout    HTN (hypertension)    Hyponatremia    Osteoporosis    Prostate cancer (HCC)    Ulcer    Vasitis 05/2023   Past Surgical History:  Procedure Laterality Date   COLONOSCOPY WITH ESOPHAGOGASTRODUODENOSCOPY (EGD)     EXCISION MASS HEAD Left 10/29/2024   Procedure: Facial mass excision;  Surgeon: Anice Riis, DO;  Location: [facility];  Service: ENT;  Laterality: Left;   MASS EXCISION Left 09/19/2023   Procedure: EXCISION MASS, deep soft tissue mass back;  Surgeon: Lane Shope, MD;  Location: [facility];  Service: General;  Laterality: Left;   PELVIC LYMPH NODE DISSECTION Bilateral 06/18/2023   Procedure: PELVIC LYMPH NODE DISSECTION;  Surgeon: Riis Knee, MD;  Location: [facility];  Service: Urology;  Laterality: Bilateral;   PROSTATE BIOPSY     ROBOT ASSISTED LAPAROSCOPIC RADICAL PROSTATECTOMY N/A 06/18/2023   Procedure: XI ROBOTIC ASSISTED LAPAROSCOPIC RADICAL PROSTATECTOMY;  Surgeon: Riis Knee,  MD;  Location: [facility];  Service: Urology;  Laterality: N/A;   TONSILLECTOMY     age 42   Patient Active Problem List   Diagnosis Date Noted   Cervical radicular pain 12/09/2024   Radicular pain of thoracic region 11/03/2024   Foraminal stenosis of thoracic region 11/03/2024   Thoracic facet joint syndrome 11/03/2024   History of substance abuse (HCC) 11/03/2024   Chronic pain syndrome 11/03/2024   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   New daily persistent headache 09/25/2023   Soft tissue mass 09/11/2023   Nodule of skin of back 08/16/2023   Nodule of skin of head 08/16/2023   Blurry vision 08/16/2023   Allergic rhinitis 08/10/2023   Osteoporosis 08/10/2023   Skin lesion 08/10/2023   Fatigue 04/06/2023   Knee pain, bilateral 03/19/2023   Compression fracture of spine (HCC) 03/19/2023   Anxious mood 03/19/2023   B12 deficiency 03/19/2023   Hypertension 03/03/2023   Gastric ulcer 03/03/2023   Hyponatremia 03/02/2023   Asthma, chronic 03/02/2023   Gout 03/02/2023   Prostate cancer (HCC) 03/02/2023   Spondylosis of lumbar region without myelopathy or radiculopathy 03/05/2020   Diverticulosis of intestine with bleeding 01/28/2016   Congenital absence of left kidney 01/28/2016   PCP: Theophilus Pagan, MD  REFERRING PROVIDER: Hilma Hastings,  PA-C  REFERRING DIAG:  Diagnosis  M47.816 (ICD-10-CM) - Lumbar spondylosis  M54.16 (ICD-10-CM) - Lumbar radiculopathy  M54.12 (ICD-10-CM) - Cervical radiculopathy  M47.812 (ICD-10-CM) - Cervical spondylosis  M47.814 (ICD-10-CM) - Thoracic spondylosis   Rationale for Evaluation and Treatment: Rehabilitation  THERAPY DIAG:  Other low back pain  Neck pain  Muscle weakness (generalized)  Joint stiffness of spine  Physical deconditioning  Chronic left-sided thoracic back pain  ONSET DATE: chronic  SUBJECTIVE:                                                                                                                                                                                            SUBJECTIVE STATEMENT: Pt. Referred to PT with chronic h/o neck/thoracic/lumbar spine pain.  Pt. Currently on disability since 2020 secondary to back pain.  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  Pt. Has been living at [facility] in [place] for past 1.5 years of sober living.  Pt. Has 4 stairs to enter/exit house.  Pt. Received an injection in back from Dr. Marcelino but pain returned in <1 week.  Pt. Currently ambulates with SPC and limited with walking distance.  Pt. Was able to walk 5 blocks in [place] in past but currently limited by pain/ deconditioning.  Pt. Not driving and hoping to join [employer] with [employer] membership.  Pt. Has received PT in past with Lupe Plump at [facility] s/p prostate surgery.    PERTINENT HISTORY:  History of present illness (HPI) [name], a 59 y.o. year old male, is here today because of his Radicular pain of thoracic region [M54.14]. Mr. Cherian's primary complain today is Back Pain   Pertinent problems: Mr. Bannister has Asthma, chronic; Prostate cancer (HCC); Gastric ulcer; Knee pain, bilateral; Compression fracture of spine (HCC); Spondylosis of lumbar region without myelopathy or radiculopathy; Congenital absence of left kidney; Radicular pain of thoracic region; Foraminal stenosis of thoracic region; Thoracic facet joint syndrome; History of substance abuse (HCC); and Chronic pain syndrome on their pertinent problem list.   Pain Assessment: Severity of Chronic pain is reported as a 4 /10. Location: Back Left, Mid, Lower/Radiates into abdomen. Onset: More than a month ago. Quality: Sharp, Aching, Constant. Timing: Constant. Modifying factor(s): Denies. Vitals:  height is 5' 9 (1.753 m) and weight is 215 lb (97.5 kg). His temporal temperature is 97.2 F (36.2 C) (abnormal). His blood pressure is 145/87 (abnormal) and his pulse is 109 (abnormal). His respiration is 20 and oxygen  saturation is 100%.  BMI: Estimated body mass index is 31.75 kg/m as calculated from the following:  Height as of this encounter: 5' 9 (1.753 m).   Weight as of this encounter: 215 lb (97.5 kg).   Last encounter: Visit date not found. Last procedure: Visit date not found.   Reason for encounter: post-procedure evaluation and assessment.    Discussed the use of AI scribe software for clinical note transcription with the patient, who gave verbal consent to proceed.   History of Present Illness   He is a 59 year old male who presents with persistent thoracic pain following a thoracic epidural steroid injection.   He underwent a thoracic epidural steroid injection on October 08, 2024. Initially, he experienced complete pain relief immediately after the procedure, but the pain returned to a level of six out of ten within four to six hours. He reported approximately 50% pain relief for the first week post-procedure, but no relief thereafter.   Prior to the procedure, his pain level was consistently around six out of ten, worsening throughout the day from a two or three in the morning to a six by noon. The pain is described as a shooting pain across the abdomen, radiating from the back around to the front. Previously, lying down provided relief, but now it does not, and he has experienced several sleepless nights due to discomfort.   He takes Advil  and Tylenol , typically once or twice a day, to manage the pain. He has also used ice and heat therapy post-procedure, applying ice for six to eight hours on the day of the procedure and heat the following day.   He denied engaging in any work or activities that could have caused injury. Bending or stretching does not significantly increase his pain. However, activities such as standing or sitting upright for extended periods, or bending slightly while performing tasks, exacerbate his symptoms.   He has no history of diabetes, and his glucose levels  have been normal in the past. He recalls having his sugar levels checked by his primary care doctor, but cannot remember the exact timing of the last test.     Procedure Type:  Inter-Laminar Thoracic Epidural Steroid Block/Injection  #1  Laterality: Left Level: T10-11  DOS: 10/08/2024 Performed by: Wallie Sherry, MD Imaging: Fluoroscopic guidance Anesthesia: Local anesthesia (1-2% Lidocaine ) Purpose: Diagnostic/Therapeutic Indications: Thoracic back pain, radicular pain, with degenerative disc disease severe enough to impact quality of life or function. 1. Radicular pain of thoracic region   2. Foraminal stenosis of thoracic region   3. Compression fracture of thoracic vertebra, unspecified thoracic vertebral level, sequela     NAS-11 Pain score:        Pre-procedure: 4 /10        Post-procedure: 4 /10    Post-Procedure Evaluation    Effectiveness:  Initial hour after procedure: 100 % . Subsequent 4-6 hours post-procedure: 100 % . Analgesia past initial 6 hours: 50 % . Ongoing improvement:  Analgesic:  50% (approximately 7 days, and then pain return to baseline) Function: Somewhat improved ROM: Somewhat improved Interpretation: Mr. Kolston underwent a diagnostic/therapeutic Inter-Laminar Thoracic Epidural Steroid Block/Injection on October 08, 2024.  He reports initially 100% pain relief and functional improvement during local anesthetic phase, followed by sustained ongoing 50% pain relief and functional improvement approximately 7 days and then pain returned to baseline.   PAIN:  Are you having pain? Yes: NPRS scale: 5/10 Pain location: cervical/thoracic/lumbar spine Pain description: sharp in L thoracic spine/ribs/ radiating pain Aggravating factors: prolonged standing/ increase movement Relieving factors: short-term relief with injection  PRECAUTIONS: None  RED FLAGS: None   WEIGHT BEARING RESTRICTIONS: No  FALLS:  Has patient fallen in last 6 months? No  LIVING  ENVIRONMENT: Lives with: lives in an adult home Lives in: House/apartment Stairs: Yes: External: 4 steps; none Has following equipment at home: Single point cane  OCCUPATION: On disability  PLOF: Independent  PATIENT GOALS: Decrease neck/back pain and increase overall conditioning to improve walking endurance/ functional mobility.    NEXT MD VISIT: 11/05/24  OBJECTIVE:  Note: Objective measures were completed at Evaluation unless otherwise noted.  DIAGNOSTIC FINDINGS:  EXAM: MRI CERVICAL SPINE WITHOUT CONTRAST 08/12/2024 07:42:00 AM   TECHNIQUE: Multiplanar multisequence MRI of the cervical spine was performed.   COMPARISON: CT of the cervical spine dated 07/30/2024.   CLINICAL HISTORY: Abnormal MRI. Weakness in limbs with pain in neck. HA's. Numb/ting in all extremities for 6 months.   FINDINGS:   BONES AND ALIGNMENT: Normal alignment. Normal vertebral body heights. Bone marrow signal is unremarkable.   SPINAL CORD: Normal spinal cord size. No abnormal spinal cord signal. No apparent impingement of the spinal cord.   SOFT TISSUES: No paraspinal mass.   C2-C3: No significant disc herniation. No spinal canal stenosis or neural foraminal narrowing.   C3-C4: Minimal disc bulging and mild bilateral uncovertebral joint hypertrophy with mild central spinal canal stenosis. The neural foramina are patent.   C4-C5: No significant disc herniation. No spinal canal stenosis or neural foraminal narrowing.   C5-C6: Broad-based disc bulge and endplate ridging with mild central spinal canal stenosis and mild right neural foraminal stenosis.   C6-C7: Diffuse disc bulging and left-sided uncovertebral joint hypertrophy, with mild left spinal canal and left neural foraminal stenosis.   C7-T1: No significant disc herniation. No spinal canal stenosis or neural foraminal narrowing.   IMPRESSION: 1. Mild central spinal canal stenosis at C3-4, C5-6, and left C6-7. 2.  Mild right neural foraminal stenosis at C5-6 and mild left neural foraminal stenosis at C6-7. 3. No apparent impingement of the spinal cord or nerve roots.   Electronically signed by: Evalene Coho MD 08/12/2024 08:16 AM  EXAM: MRI THORACIC SPINE WITHOUT CONTRAST   TECHNIQUE: Multiplanar, multisequence MR imaging of the thoracic spine was performed. No intravenous contrast was administered.   COMPARISON:  CT cervical spine 07/30/2024. CTA chest 03/20/2024. CT Abdomen and Pelvis 07/14/2023.   FINDINGS: Limited cervical spine imaging: Largely negative for age, concordant with cervical spine CT earlier this month.   Thoracic spine segmentation:  Normal on previous CTs.   Alignment: Chronic exaggerated lower thoracic kyphosis in conjunction with chronic lower thoracic compression fractures, stable from CT last year. No significant thoracic scoliosis or spondylolisthesis.   Vertebrae: Chronic superior endplate compression fractures of T10, T11 and T12 are stable since last year. No associated marrow edema. Normal background bone marrow signal. Small chronic sclerotic focus superimposed in the right T10 vertebral body also stable by CT since last year and compatible with benign etiology, probably bone island. Stable, maintained thoracic vertebral height elsewhere. No marrow edema or evidence of acute osseous abnormality.   Cord: Normal. Capacious thoracic spinal canal at most levels, see details below. Conus medullaris mostly visible at T12-L1 and appears to be normal.   Paraspinal and other soft tissues: Stable, negative.   Disc levels:   No age advanced thoracic spine degeneration   T1-T2 through T7-T8.   T8-T9: Circumferential disc bulge with mild to moderate facet and ligament flavum hypertrophy. No spinal stenosis. Mild left and moderate to severe right T8 neural  foraminal stenosis.   T9-T10: Negative disc. Moderate to severe facet hypertrophy. Mild left and  moderate to severe right T9 foraminal stenosis.   T10-T11: Minor disc bulging. Moderate facet and ligament flavum hypertrophy. Mild T10 foraminal stenosis.   T11-T12: Disc osteophyte complex in part related to chronic retropulsion of the posterosuperior T12 endplate, stable from last year. Moderate posterior element hypertrophy. Borderline to mild spinal stenosis. No cord mass effect. Mild bilateral T11 foraminal stenosis.   IMPRESSION: 1. No acute or suspicious osseous abnormality in the thoracic spine. Chronic T10, T11, and T12 compression fractures are stable from last year. 2. Mild for age thoracic spine degeneration above T8-T9. Mild chronic T12 retropulsion contributes to borderline or mild spinal stenosis at T11-T12. Lower thoracic facet arthropathy results in moderate to severe neural foraminal stenosis at the right T8 and T9 nerve levels.   Electronically Signed   By: VEAR Hurst M.D.   On: 08/08/2024 10:57  MRI LUMBAR SPINE 07/01/2024 02:37:10 PM   TECHNIQUE: Multiplanar multisequence MRI of the lumbar spine was performed without the administration of intravenous contrast.   COMPARISON: MRI of the lumbar spine dated 04/04/2023.   CLINICAL HISTORY: Low back pain, cauda equina syndrome suspected. Pt reports lower back pain that radiates to his abdomen and is now causing him to leak urine. Pt has prostate hx and chronic back pain but the incontinence is new.   FINDINGS:   BONES AND ALIGNMENT: Moderate chronic compression deformity of T12, unchanged from prior study. The vertebral body has lost about 50% of its height anteriorly and there is a chronic schmorl's node within the superior endplate. There is chronic downward bowing of the superior endplates of L1 and L2, as before.   SPINAL CORD: The conus medullaris terminates at L1.   SOFT TISSUES: No paraspinal mass.   L1-L2: No significant disc herniation. No spinal canal stenosis or neural  foraminal narrowing.   L2-L3: Mild diffuse disc bulging with mild bilateral lateral recess stenosis.   L3-L4: Slight disc bulging without significant spinal canal or neural foraminal stenosis.   L4-L5: No significant disc herniation. No spinal canal stenosis or neural foraminal narrowing.   L5-S1: Slight disc bulging without significant spinal canal or neural foraminal stenosis.   OTHER FINDINGS: The left kidney is not visualized.   IMPRESSION: 1. No evidence of cauda equina syndrome. 2. Moderate chronic compression deformity of T12, unchanged. 3. Chronic downward bowing of the superior endplates of L1 and L2, as before. 4. Mild diffuse disc bulging at L2-3 with mild bilateral lateral recess stenosis. 5. Slight disc bulging at L3-4 and L5-S1 without significant spinal canal or neural foraminal stenosis.  PATIENT SURVEYS:  MODI: 68% self-perceived severe disability  COGNITION: Overall cognitive status: Within functional limits for tasks assessed     SENSATION: Light touch: Impaired   MUSCLE LENGTH: Hamstrings: Right 48 deg; Left 33 deg Thomas test: NT  POSTURE: rounded shoulders and forward head  PALPATION: Moderate tenderness in B lumbar paraspinals/ SI region.  Pt. Reports B LE radicular symptoms with prolonged standing.    LUMBAR ROM:  Flexion: limited 25%, extension: limited 50%, L/R rotn. Limited 25% and L/R lateral flexion WFL (L sided rib pain).  5/10 L thoracic/ rib pain.  Mid-thoracic/ lumbar hypomobility with assessment of central/unilateral mobs.    CERVICAL ROM:      Flexion: 41 deg./ extension 35 deg./ L rotn. 44 deg./ R rotn. 48 deg./ R lat. Flexion 40 deg./ L lateral flexion.   B shoulder AROM  WFL   LOWER EXTREMITY MMT:    MMT Right eval Left eval  Hip flexion 4+ 4+  Hip extension    Hip abduction 4+ 4+  Hip adduction    Hip internal rotation 5 5  Hip external rotation 5 5  Knee flexion 5 5  Knee extension 5 5  Ankle dorsiflexion     Ankle plantarflexion    Ankle inversion    Ankle eversion     (Blank rows = not tested)  LUMBAR SPECIAL TESTS:  (+) SLR on L.  FUNCTIONAL TESTS:  5xSTS: 26.6 sec  GAIT: Distance walked: in clinic Assistive device utilized: Single point cane Level of assistance: Modified independence Comments: antalgic gait with increase c/o back pain with distance walked.    5xSTS completed:  -26.6 sec  TREATMENT DATE: 12/24/2024        SUBJECTIVE STATEMENT:  Pt reports feeling good today because of recent injection. Reports neck feels really good, and mid back is not as painful as a result.  Pt reports thoracic radiating pain is not as bad and is more dull.  Pt reports L knee is feeling better.  Therapeutic Exercise: - for improved soft tissue flexibility and extensibility as needed for ROM, improved strength as needed to improve performance of CKC activities/functional movements  NuStep L seat 8 for 10 min. B UE/LE to improve soft tissue mobility and increased tissue temperature to improve muscle performance.  Nautilus: Seated Lat pull downs 70# 1x10. Standing Retraction 2x15 60#. Pallof press 2x15x. 60# on L/R with cuing for proper technique. Resisted lateral walking 70# L/R 3 laps, Forward 3 laps 70#. Focus on hip strength and control.   Hallway walk ~280 ft for LE strength and conditioning.  Prone press up 1x5  Manual therapy:  for pain modulation    Prone STM to thoracic/lumbar paraspinals.  Grade III PA unilateral/central mobs from mid-thoracic to lumbar spine.  Prone R/L rib springing to assess mobility (+) R mid-thoracic hypomobility noted in mid back. (MH pack added for comfort)  MODI 12/24/24= 62%  PATIENT EDUCATION:  Education details: Spine hypomobility/ functional goals/ Artist Person educated: Patient Education method: Explanation Education comprehension: verbalized understanding  HOME EXERCISE PROGRAM: Access Code: [email or web address] URL:  [email or web address] Date: 11/06/2024 Prepared by: Venetia Endo  Exercises - Static Prone on Elbows  - 3-4 x daily - 7 x weekly - 1 sets - 3-4 min hold - Hooklying Lumbar Traction  - 7 x weekly - 10 reps - 5-10sec hold - Supine Lower Trunk Rotation  - 2 x daily - 7 x weekly - 2 sets - 10 reps - Supine Bridge  - 2 x daily - 7 x weekly - 2 sets - 10 reps - Seated Scapular Retraction  - 2 x daily - 7 x weekly - 2 sets - 10 reps - 3sec hold  Access Code: [email or web address] URL: [email or web address] Date: 11/19/2024 Prepared by: Ozell Sero  Exercises - Static Prone on Elbows  - 3-4 x daily - 7 x weekly - 1 sets - 3-4 min hold - Hooklying Lumbar Traction  - 7 x weekly - 10 reps - 5-10sec hold - Supine Lower Trunk Rotation  - 2 x daily - 7 x weekly - 2 sets - 10 reps - Supine Bridge  - 2 x daily - 7 x weekly - 2 sets - 10 reps - Scapular Retraction with Resistance  - 1 x daily - 4 x weekly - 2 sets - 15 reps - Shoulder W -  External Rotation with Resistance  - 1 x daily - 4 x weekly - 2 sets - 15 reps   ASSESSMENT:  CLINICAL IMPRESSION: Pt was feeling really good after injection that was on Monday. Pt did well with increase of weight with scapular retractions. Pt was able to do more weight and laps with resisted gait with no increase of back pain. Pt was able to walk farther in hallway with no c/o back pain. MODI was reassessed which was 62% which was a decrease from baseline measure. Pt feels ready to be independent with a home/gym based there ex program. Pt will contact PT with any questions concerning home/ gym based HEP, and will f/u with MD about possible lumbar procedure.  OBJECTIVE IMPAIRMENTS: Abnormal gait, decreased activity tolerance, decreased balance, decreased coordination, decreased endurance, decreased mobility, difficulty walking, decreased ROM, decreased strength, hypomobility, impaired flexibility, improper body mechanics, postural dysfunction, and  pain.   ACTIVITY LIMITATIONS: carrying, lifting, bending, standing, squatting, and locomotion level  PARTICIPATION LIMITATIONS: cleaning and community activity  PERSONAL FACTORS: Fitness and Past/current experiences are also affecting patient's functional outcome.   REHAB POTENTIAL: Good  CLINICAL DECISION MAKING: Evolving/moderate complexity  EVALUATION COMPLEXITY: Moderate   GOALS: Goals reviewed with patient? Yes  SHORT TERM GOALS: Target date: 11/24/24  Pt. Will be independent with HEP to increase B hip strength 1/2 muscle grade to improve walking/ stair climbing.   Baseline:  see above Goal status: Partially met  2.  Pt. Able to complete 30 minutes of exercise with no increase c/o back pain or radicular symptoms to improve pain-free mobility.  Baseline:  5/10 back pain at rest and increase pain/ radicular symptoms with activity Goal status: Partially met  LONG TERM GOALS: Target date: 12/15/24  Pt. Will decrease MODI to <50% to improve self-perceived pain-free mobility. Baseline: 68% self-perceived severe disability.  2/4: 62%  (slight improvement noted) Goal status: Partially met  2.  Pt. Will complete 5xSTS and decrease time by 3 seconds to improve pain-free mobility.  Baseline: 26.6 sec  Goal status: Not reassessed 12/17/24: (Did not perform secondary to knee pain)  3.  Pt. Able to walk 5 blocks from house to regular meetings with no increase c/o back pain/ improved endurance.   Baseline: limited walking distance due to moderate back pain.  2/4: improvement reported Goal status: Partially met  4.  Pt. Will join/ participate with exercise program at [facility] with no increase c/o back pain.   Baseline: currently not exercising.  2/4: going to [employer] in [place] Goal status: Goal met  PLAN:  PT FREQUENCY: 2x/week  PT DURATION: 6 weeks  PLANNED INTERVENTIONS: 97110-Therapeutic exercises, 97530- Therapeutic activity, [id number]- Neuromuscular re-education,  97535- Self Care, [id number]- Manual therapy, [id number]- Gait training, [phone]- Electrical stimulation (unattended), and Patient/Family education.  PLAN FOR NEXT SESSION: Discharge visit.  Pt. Instructed to contact PT if any questions or issues.    Ozell JAYSON Sero, PT, DPT # [id number] [place] Gainer, SPT 12/24/2024, 3:44 PM

## 2024-12-29 ENCOUNTER — Ambulatory Visit: Admitting: Neurosurgery

## 2024-12-30 ENCOUNTER — Ambulatory Visit: Payer: Self-pay | Admitting: Family Medicine

## 2025-01-20 ENCOUNTER — Ambulatory Visit: Payer: Self-pay | Admitting: Student in an Organized Health Care Education/Training Program

## 2025-04-01 ENCOUNTER — Ambulatory Visit: Admitting: Neurology

## 2025-06-29 ENCOUNTER — Encounter
